# Patient Record
Sex: Male | Born: 1940 | Race: Black or African American | Hispanic: No | Marital: Married | Smoking: Former smoker
Health system: Southern US, Community
[De-identification: ages and names within clinical notes are randomized; demographics above are authoritative.]

## PROBLEM LIST (undated history)

## (undated) DIAGNOSIS — R972 Elevated prostate specific antigen [PSA]: Secondary | ICD-10-CM

## (undated) DIAGNOSIS — Z8673 Personal history of transient ischemic attack (TIA), and cerebral infarction without residual deficits: Secondary | ICD-10-CM

## (undated) DIAGNOSIS — M1990 Unspecified osteoarthritis, unspecified site: Secondary | ICD-10-CM

## (undated) DIAGNOSIS — Z8619 Personal history of other infectious and parasitic diseases: Secondary | ICD-10-CM

## (undated) DIAGNOSIS — D72819 Decreased white blood cell count, unspecified: Secondary | ICD-10-CM

## (undated) DIAGNOSIS — R413 Other amnesia: Secondary | ICD-10-CM

## (undated) DIAGNOSIS — Z86010 Personal history of colonic polyps: Secondary | ICD-10-CM

## (undated) DIAGNOSIS — R351 Nocturia: Secondary | ICD-10-CM

## (undated) DIAGNOSIS — L309 Dermatitis, unspecified: Secondary | ICD-10-CM

## (undated) DIAGNOSIS — IMO0002 Reserved for concepts with insufficient information to code with codable children: Secondary | ICD-10-CM

## (undated) DIAGNOSIS — H548 Legal blindness, as defined in USA: Secondary | ICD-10-CM

## (undated) DIAGNOSIS — F419 Anxiety disorder, unspecified: Secondary | ICD-10-CM

## (undated) DIAGNOSIS — Z978 Presence of other specified devices: Secondary | ICD-10-CM

## (undated) DIAGNOSIS — H409 Unspecified glaucoma: Secondary | ICD-10-CM

## (undated) DIAGNOSIS — R32 Unspecified urinary incontinence: Secondary | ICD-10-CM

## (undated) DIAGNOSIS — I10 Essential (primary) hypertension: Secondary | ICD-10-CM

## (undated) DIAGNOSIS — N200 Calculus of kidney: Secondary | ICD-10-CM

## (undated) DIAGNOSIS — K219 Gastro-esophageal reflux disease without esophagitis: Secondary | ICD-10-CM

## (undated) DIAGNOSIS — N433 Hydrocele, unspecified: Secondary | ICD-10-CM

## (undated) DIAGNOSIS — R319 Hematuria, unspecified: Secondary | ICD-10-CM

## (undated) DIAGNOSIS — G8929 Other chronic pain: Secondary | ICD-10-CM

## (undated) DIAGNOSIS — N201 Calculus of ureter: Secondary | ICD-10-CM

## (undated) DIAGNOSIS — R3915 Urgency of urination: Secondary | ICD-10-CM

## (undated) DIAGNOSIS — N183 Chronic kidney disease, stage 3 unspecified: Secondary | ICD-10-CM

## (undated) DIAGNOSIS — Z860101 Personal history of adenomatous and serrated colon polyps: Secondary | ICD-10-CM

## (undated) DIAGNOSIS — Z87442 Personal history of urinary calculi: Secondary | ICD-10-CM

## (undated) DIAGNOSIS — K5909 Other constipation: Secondary | ICD-10-CM

## (undated) DIAGNOSIS — M549 Dorsalgia, unspecified: Secondary | ICD-10-CM

## (undated) DIAGNOSIS — R29898 Other symptoms and signs involving the musculoskeletal system: Secondary | ICD-10-CM

## (undated) DIAGNOSIS — D696 Thrombocytopenia, unspecified: Secondary | ICD-10-CM

## (undated) DIAGNOSIS — Z923 Personal history of irradiation: Secondary | ICD-10-CM

## (undated) DIAGNOSIS — D631 Anemia in chronic kidney disease: Secondary | ICD-10-CM

## (undated) DIAGNOSIS — R339 Retention of urine, unspecified: Secondary | ICD-10-CM

## (undated) DIAGNOSIS — Z972 Presence of dental prosthetic device (complete) (partial): Secondary | ICD-10-CM

## (undated) DIAGNOSIS — R350 Frequency of micturition: Secondary | ICD-10-CM

## (undated) DIAGNOSIS — E785 Hyperlipidemia, unspecified: Secondary | ICD-10-CM

## (undated) DIAGNOSIS — R109 Unspecified abdominal pain: Secondary | ICD-10-CM

## (undated) DIAGNOSIS — C61 Malignant neoplasm of prostate: Secondary | ICD-10-CM

## (undated) HISTORY — DX: Elevated prostate specific antigen (PSA): R97.20

## (undated) HISTORY — PX: EXTRACORPOREAL SHOCK WAVE LITHOTRIPSY: SHX1557

## (undated) HISTORY — PX: OTHER SURGICAL HISTORY: SHX169

## (undated) HISTORY — DX: Personal history of other infectious and parasitic diseases: Z86.19

---

## 1997-06-29 ENCOUNTER — Emergency Department (HOSPITAL_COMMUNITY): Admission: EM | Admit: 1997-06-29 | Discharge: 1997-06-29 | Payer: Self-pay

## 1997-06-30 ENCOUNTER — Ambulatory Visit (HOSPITAL_COMMUNITY): Admission: EM | Admit: 1997-06-30 | Discharge: 1997-07-01 | Payer: Self-pay | Admitting: Emergency Medicine

## 1997-07-03 ENCOUNTER — Ambulatory Visit (HOSPITAL_COMMUNITY): Admission: RE | Admit: 1997-07-03 | Discharge: 1997-07-03 | Payer: Self-pay | Admitting: Urology

## 1998-04-27 ENCOUNTER — Emergency Department (HOSPITAL_COMMUNITY): Admission: EM | Admit: 1998-04-27 | Discharge: 1998-04-27 | Payer: Self-pay | Admitting: Emergency Medicine

## 2002-01-03 HISTORY — PX: TRANSURETHRAL RESECTION OF BLADDER: SUR1395

## 2002-03-24 ENCOUNTER — Emergency Department (HOSPITAL_COMMUNITY): Admission: EM | Admit: 2002-03-24 | Discharge: 2002-03-24 | Payer: Self-pay

## 2002-11-12 ENCOUNTER — Emergency Department (HOSPITAL_COMMUNITY): Admission: EM | Admit: 2002-11-12 | Discharge: 2002-11-12 | Payer: Self-pay | Admitting: Emergency Medicine

## 2002-12-10 ENCOUNTER — Ambulatory Visit: Admission: RE | Admit: 2002-12-10 | Discharge: 2002-12-10 | Payer: Self-pay | Admitting: Surgery

## 2002-12-23 ENCOUNTER — Encounter (INDEPENDENT_AMBULATORY_CARE_PROVIDER_SITE_OTHER): Payer: Self-pay | Admitting: Specialist

## 2002-12-23 ENCOUNTER — Inpatient Hospital Stay (HOSPITAL_COMMUNITY): Admission: RE | Admit: 2002-12-23 | Discharge: 2002-12-25 | Payer: Self-pay | Admitting: Urology

## 2002-12-23 HISTORY — PX: OTHER SURGICAL HISTORY: SHX169

## 2003-02-10 ENCOUNTER — Ambulatory Visit (HOSPITAL_BASED_OUTPATIENT_CLINIC_OR_DEPARTMENT_OTHER): Admission: RE | Admit: 2003-02-10 | Discharge: 2003-02-10 | Payer: Self-pay | Admitting: Urology

## 2003-02-10 ENCOUNTER — Ambulatory Visit (HOSPITAL_COMMUNITY): Admission: RE | Admit: 2003-02-10 | Discharge: 2003-02-10 | Payer: Self-pay | Admitting: Urology

## 2003-02-10 HISTORY — PX: OTHER SURGICAL HISTORY: SHX169

## 2003-02-13 ENCOUNTER — Ambulatory Visit (HOSPITAL_COMMUNITY): Admission: RE | Admit: 2003-02-13 | Discharge: 2003-02-13 | Payer: Self-pay | Admitting: Urology

## 2003-05-13 ENCOUNTER — Encounter: Admission: RE | Admit: 2003-05-13 | Discharge: 2003-05-13 | Payer: Self-pay | Admitting: Neurology

## 2003-05-25 ENCOUNTER — Emergency Department (HOSPITAL_COMMUNITY): Admission: EM | Admit: 2003-05-25 | Discharge: 2003-05-25 | Payer: Self-pay | Admitting: Internal Medicine

## 2003-05-27 ENCOUNTER — Emergency Department (HOSPITAL_COMMUNITY): Admission: EM | Admit: 2003-05-27 | Discharge: 2003-05-27 | Payer: Self-pay | Admitting: Emergency Medicine

## 2003-05-29 ENCOUNTER — Ambulatory Visit (HOSPITAL_BASED_OUTPATIENT_CLINIC_OR_DEPARTMENT_OTHER): Admission: RE | Admit: 2003-05-29 | Discharge: 2003-05-29 | Payer: Self-pay | Admitting: Urology

## 2003-05-29 HISTORY — PX: OTHER SURGICAL HISTORY: SHX169

## 2004-10-18 ENCOUNTER — Emergency Department (HOSPITAL_COMMUNITY): Admission: EM | Admit: 2004-10-18 | Discharge: 2004-10-18 | Payer: Self-pay | Admitting: Emergency Medicine

## 2004-10-24 ENCOUNTER — Emergency Department (HOSPITAL_COMMUNITY): Admission: EM | Admit: 2004-10-24 | Discharge: 2004-10-24 | Payer: Self-pay | Admitting: Emergency Medicine

## 2005-05-08 ENCOUNTER — Emergency Department (HOSPITAL_COMMUNITY): Admission: EM | Admit: 2005-05-08 | Discharge: 2005-05-08 | Payer: Self-pay | Admitting: Emergency Medicine

## 2005-09-22 ENCOUNTER — Encounter: Admission: RE | Admit: 2005-09-22 | Discharge: 2005-09-22 | Payer: Self-pay | Admitting: Family Medicine

## 2005-11-06 ENCOUNTER — Inpatient Hospital Stay (HOSPITAL_COMMUNITY): Admission: EM | Admit: 2005-11-06 | Discharge: 2005-11-11 | Payer: Self-pay | Admitting: *Deleted

## 2005-11-06 HISTORY — PX: OTHER SURGICAL HISTORY: SHX169

## 2005-11-07 ENCOUNTER — Encounter: Payer: Self-pay | Admitting: Urology

## 2005-11-14 HISTORY — PX: OTHER SURGICAL HISTORY: SHX169

## 2007-08-08 ENCOUNTER — Emergency Department (HOSPITAL_COMMUNITY): Admission: EM | Admit: 2007-08-08 | Discharge: 2007-08-08 | Payer: Self-pay | Admitting: Emergency Medicine

## 2007-11-05 ENCOUNTER — Emergency Department (HOSPITAL_COMMUNITY): Admission: EM | Admit: 2007-11-05 | Discharge: 2007-11-05 | Payer: Self-pay | Admitting: Emergency Medicine

## 2007-11-08 ENCOUNTER — Ambulatory Visit (HOSPITAL_COMMUNITY): Admission: RE | Admit: 2007-11-08 | Discharge: 2007-11-08 | Payer: Self-pay | Admitting: Urology

## 2007-12-03 ENCOUNTER — Encounter: Payer: Self-pay | Admitting: Emergency Medicine

## 2007-12-04 ENCOUNTER — Inpatient Hospital Stay (HOSPITAL_COMMUNITY): Admission: EM | Admit: 2007-12-04 | Discharge: 2007-12-04 | Payer: Self-pay | Admitting: Urology

## 2007-12-10 ENCOUNTER — Ambulatory Visit (HOSPITAL_COMMUNITY): Admission: RE | Admit: 2007-12-10 | Discharge: 2007-12-12 | Payer: Self-pay | Admitting: Urology

## 2008-12-03 DIAGNOSIS — Z8673 Personal history of transient ischemic attack (TIA), and cerebral infarction without residual deficits: Secondary | ICD-10-CM

## 2008-12-03 HISTORY — DX: Personal history of transient ischemic attack (TIA), and cerebral infarction without residual deficits: Z86.73

## 2008-12-18 ENCOUNTER — Encounter (INDEPENDENT_AMBULATORY_CARE_PROVIDER_SITE_OTHER): Payer: Self-pay | Admitting: Internal Medicine

## 2008-12-18 ENCOUNTER — Ambulatory Visit: Payer: Self-pay | Admitting: Vascular Surgery

## 2008-12-18 ENCOUNTER — Observation Stay (HOSPITAL_COMMUNITY): Admission: EM | Admit: 2008-12-18 | Discharge: 2008-12-19 | Payer: Self-pay | Admitting: Emergency Medicine

## 2008-12-18 ENCOUNTER — Ambulatory Visit: Payer: Self-pay | Admitting: Cardiology

## 2008-12-19 ENCOUNTER — Encounter (INDEPENDENT_AMBULATORY_CARE_PROVIDER_SITE_OTHER): Payer: Self-pay | Admitting: Internal Medicine

## 2008-12-19 HISTORY — PX: TRANSTHORACIC ECHOCARDIOGRAM: SHX275

## 2009-04-04 ENCOUNTER — Emergency Department (HOSPITAL_COMMUNITY): Admission: EM | Admit: 2009-04-04 | Discharge: 2009-04-04 | Payer: Self-pay | Admitting: Emergency Medicine

## 2009-05-30 ENCOUNTER — Emergency Department (HOSPITAL_COMMUNITY): Admission: EM | Admit: 2009-05-30 | Discharge: 2009-05-30 | Payer: Self-pay | Admitting: Emergency Medicine

## 2009-10-31 ENCOUNTER — Emergency Department (HOSPITAL_COMMUNITY)
Admission: EM | Admit: 2009-10-31 | Discharge: 2009-10-31 | Payer: Self-pay | Source: Home / Self Care | Admitting: Family Medicine

## 2009-12-02 ENCOUNTER — Encounter: Admission: RE | Admit: 2009-12-02 | Discharge: 2009-12-02 | Payer: Self-pay | Admitting: Family Medicine

## 2010-01-08 LAB — CBC
HCT: 46.7 % (ref 39.0–52.0)
Hemoglobin: 16 g/dL (ref 13.0–17.0)
MCH: 33.6 pg (ref 26.0–34.0)
MCHC: 34.3 g/dL (ref 30.0–36.0)
MCV: 98.1 fL (ref 78.0–100.0)
Platelets: 152 10*3/uL (ref 150–400)
RBC: 4.76 MIL/uL (ref 4.22–5.81)
RDW: 11.8 % (ref 11.5–15.5)
WBC: 5.7 10*3/uL (ref 4.0–10.5)

## 2010-01-13 ENCOUNTER — Inpatient Hospital Stay (HOSPITAL_COMMUNITY)
Admission: RE | Admit: 2010-01-13 | Discharge: 2010-01-16 | Payer: Self-pay | Source: Home / Self Care | Attending: Urology | Admitting: Urology

## 2010-01-18 LAB — POCT I-STAT 4, (NA,K, GLUC, HGB,HCT)
Glucose, Bld: 110 mg/dL — ABNORMAL HIGH (ref 70–99)
HCT: 42 % (ref 39.0–52.0)
Hemoglobin: 14.3 g/dL (ref 13.0–17.0)
Potassium: 5.1 mEq/L (ref 3.5–5.1)
Sodium: 140 mEq/L (ref 135–145)

## 2010-01-18 LAB — CBC
HCT: 33.2 % — ABNORMAL LOW (ref 39.0–52.0)
HCT: 32.3 % — ABNORMAL LOW (ref 39.0–52.0)
Hemoglobin: 11.2 g/dL — ABNORMAL LOW (ref 13.0–17.0)
Hemoglobin: 10.9 g/dL — ABNORMAL LOW (ref 13.0–17.0)
MCH: 33.2 pg (ref 26.0–34.0)
MCH: 33 pg (ref 26.0–34.0)
MCHC: 33.7 g/dL (ref 30.0–36.0)
MCHC: 33.7 g/dL (ref 30.0–36.0)
MCV: 98.5 fL (ref 78.0–100.0)
MCV: 97.9 fL (ref 78.0–100.0)
Platelets: 128 10*3/uL — ABNORMAL LOW (ref 150–400)
Platelets: 137 10*3/uL — ABNORMAL LOW (ref 150–400)
RBC: 3.37 MIL/uL — ABNORMAL LOW (ref 4.22–5.81)
RBC: 3.3 MIL/uL — ABNORMAL LOW (ref 4.22–5.81)
RDW: 11.8 % (ref 11.5–15.5)
RDW: 11.9 % (ref 11.5–15.5)
WBC: 12.7 10*3/uL — ABNORMAL HIGH (ref 4.0–10.5)
WBC: 18.2 10*3/uL — ABNORMAL HIGH (ref 4.0–10.5)

## 2010-01-18 LAB — TYPE AND SCREEN
ABO/RH(D): O POS
Antibody Screen: NEGATIVE

## 2010-01-18 LAB — ABO/RH: ABO/RH(D): O POS

## 2010-01-24 ENCOUNTER — Encounter: Payer: Self-pay | Admitting: Neurology

## 2010-01-24 ENCOUNTER — Encounter: Payer: Self-pay | Admitting: Family Medicine

## 2010-01-24 ENCOUNTER — Encounter: Payer: Self-pay | Admitting: Urology

## 2010-01-28 LAB — SURGICAL PCR SCREEN: MRSA, PCR: NEGATIVE

## 2010-01-29 ENCOUNTER — Ambulatory Visit (HOSPITAL_COMMUNITY)
Admission: RE | Admit: 2010-01-29 | Discharge: 2010-01-29 | Payer: Self-pay | Source: Home / Self Care | Attending: Urology | Admitting: Urology

## 2010-01-29 LAB — CBC
HCT: 31.5 % — ABNORMAL LOW (ref 39.0–52.0)
Hemoglobin: 10.9 g/dL — ABNORMAL LOW (ref 13.0–17.0)
MCH: 33.4 pg (ref 26.0–34.0)
MCHC: 34.6 g/dL (ref 30.0–36.0)
MCV: 96.6 fL (ref 78.0–100.0)
Platelets: 241 10*3/uL (ref 150–400)
RBC: 3.26 MIL/uL — ABNORMAL LOW (ref 4.22–5.81)
RDW: 12.2 % (ref 11.5–15.5)
WBC: 7.7 10*3/uL (ref 4.0–10.5)

## 2010-02-03 NOTE — Op Note (Signed)
[name], [name]               ACCOUNT NO.:  [id number]  MEDICAL RECORD NO.:  [id number]          PATIENT TYPE:  INP  LOCATION:  NA                           FACILITY:  [facility]  PHYSICIAN:  Bertram Millard. 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  POSTOPERATIVE DIAGNOSIS:  A 10-cm left staghorn stone.  PROCEDURE:  Percutaneous nephrolithotomy.  SURGEON:  Bertram Millard. Halia Franey, M.D.  ANESTHESIA:  General endotracheal.  COMPLICATIONS:  None.  SPECIMEN:  Stones to family.  DRAINS:  A 16-French Foley catheter transurethrally, 20-French Council- tip catheter,  Superfit nephrostomy tube.  COMPLICATIONS:  None.  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  He was seen recently for left flank pain.  He was scanned, and this showed a 10-cm left renal stone in the staghorn configuration.  The patient has not been on urinary optimization, and the stone has obviously progressed to the point where it is painful and needs treatment.  He has been seen in the office, and it was recommended he undergo removal of the stone by way percutaneous nephrolithotomy. Risks and complications of the procedure as well as alternatives have been discussed at length. He understands that a second look procedure may be necessary.  He understands these and desires to proceed.  DESCRIPTION OF PROCEDURE:  The patient was identified and marked in the holding area.  He received preoperative IV Cipro.  He was taken to the operating room where a general endotracheal anesthetic was established. A 16-French Foley catheter was placed in his bladder.  The patient was then transferred to the OR table and placed in the prone position.  All extremities were padded appropriately.  His left flank was prepped and draped.  A time-out was  then called.  I then gained access to the left renal pelvis by using 2 guide wires, a peel-away catheter, and eventually dilating  the nephrostomy tract with a Trackmaster balloon and a 28-French percutaneous sheath.  I then identified the stone with the nephroscope.  The lithotrite was used through the nephroscope to fragment the stone into multiple small fragments, as well as to aspirate the stone with use of the ultrasound. Multiple small fragments were removed with the graspers.  Over a period of both 2 hours, the stone was fragmented and extracted.  Inspection of the upper and lower pole as well as the renal pelvic area revealed multiple sand-like fragments, but the margins were removed and I did not see any significantly large fragments.  There was a significant amount of bleeding during the procedure due to the friability of the kidney.  I did my best to visualize all aspects of the kidney before the procedure was terminated, and a 20-French Council tip catheter was placed over the guidewire through the flank.  5 mL of contrast was placed in the balloon, a nephrogram was antegrade, and  nephrostogram was then performed.  As there was avery large stone burden, and significant bleeding during the procedure,  I decided that a second look procedure would be necessary for removing remaining stone burden.  This showed egress of the antegrade movement of the contrast into the ureter without obstruction.  There were multiple filling defects in the kidney consistent with blood clots, more than likely.  No extravasation was seen.  At this point, the catheter was sutured to the skin, and a compressive dressing was placed.  The patient's hemoglobin was checked intraoperatively and was 14, down from 16 preoperatively.  We then transported the patient to the PACU in stable condition after he was extubated.     Bertram Millard. Nejla Reasor, M.D.     SMD/MEDQ  D:  01/13/2010  T:  01/13/2010   Job:  [id number]  cc:   Dr. Vedia Coffer  Electronically Signed by Marcine Matar M.D. on 02/03/2010 06:01:39 PM

## 2010-02-03 NOTE — Op Note (Signed)
NAMEJARRON, [name]               ACCOUNT NO.:  [id number]  MEDICAL RECORD NO.:  [id number]          PATIENT TYPE:  AMB  LOCATION:  DAY                          FACILITY:  [facility]  PHYSICIAN:  Bertram Millard. Jeremyah Jelley, M.D.DATE OF BIRTH:  Jun 12, 1940  DATE OF PROCEDURE:  01/29/2010 DATE OF DISCHARGE:                              OPERATIVE REPORT   PREOPERATIVE DIAGNOSIS:  Left renal calculi.  POSTOPERATIVE DIAGNOSIS:  Left renal calculi.  PROCEDURE:  Left percutaneous nephrolithotomy, second look.  SURGEON:  Bertram Millard. Marian Meneely, MD  ANESTHESIA:  General endotracheal.  COMPLICATIONS:  None.  SPECIMEN:  Stone, to family.  BRIEF HISTORY:  This 70 year old male presents for a second-look percutaneous nephrolithotomy.  Two weeks ago, he had operative/percutaneous treatment of a 10 cm left renal pelvic stone. The patient had some fragments left.  Due to lengthy procedure and bleeding, the procedure was terminated after the large burden of stone was removed.  Followup CT revealed no perirenal hematoma and several tiny fragments as well as a 7 mm fragment left in the left kidney.  He has gone home over the past 2 weeks with a percutaneous nephrostomy tube.  He presents at this time for second look, cleanup of the residual calculi.  He is aware of risks and complications of the procedure.  He desires to proceed.  DESCRIPTION OF PROCEDURE:  The patient was identified in the holding area, the surgical site was marked, and he received preoperative IV antibiotics.  He was taken to the operating room where general endotracheal anesthetic was administered with the patient in the recumbent position.  His bladder was catheterized.  He was then placed in the prone position on the operating room table with all extremities padded appropriately.  The left flank was prepped and draped, with the 2 catheters prepped as well.  Drapes were placed.  I guided a guidewire under fluoroscopic guidance to  the ureteral access catheter that was placed percutaneously 2 weeks ago.  Once the wire was found to be in the bladder, both the large nephrostomy tube and the ureteral access catheter removed.  I then placed a 16-French cystoscope into the left kidney through the nephrostomy tract, which had matured.  There was 1 stone that I could eventually encounter in an upper pole calix that was located quite anteriorly.  It was approximately 7 mm to 8 mm in size. It took quite some time, but I navigated into the calix to remove this stone with a nitinol basket.  Several small Randall's plaques were present on papillae.  I attempted to knock these off the papillae, but this could not be done.  No other calculi were seen, with all upper, mid, and lower pole calyces inspected sequentially.  Following this, the scope was removed.  I then placed a 28 cm x 6 French Contour double-J stent using fluoroscopic guidance.  A good curl was seen in the bladder.  I then removed the pusher, and then closed the percutaneous site with a mattress suture of 0 silk.  A dry sterile dressing was placed.  No percutaneous tube was left.  The patient tolerated the procedure  well.  He was awakened and taken to PACU in stable condition.     Bertram Millard. Retta Diones, M.D.     SMD/MEDQ  D:  01/29/2010  T:  01/29/2010  Job:  [id number]  Electronically Signed by Marcine Matar M.D. on 02/03/2010 05:58:51 PM

## 2010-03-04 NOTE — Discharge Summary (Signed)
  [name], [name]               ACCOUNT NO.:  [id number]  MEDICAL RECORD NO.:  [id number]          PATIENT TYPE:  INP  LOCATION:  1433                         FACILITY:  [facility]  PHYSICIAN:  Bertram Millard. Rune Mendez, M.D.DATE OF BIRTH:  03/08/1940  DATE OF ADMISSION:  01/13/2010 DATE OF DISCHARGE:  01/16/2010                              DISCHARGE SUMMARY   PRINCIPAL PROCEDURE:  Left percutaneous nephrolithotomy.  COMPLICATIONS:  None.  OTHER DIAGNOSES:  History of recurrent renal calculi.  BRIEF HISTORY:  This elderly male was admitted for definitive management of an enlarging, 10 cm left staghorn stone.  The patient is noncompliant with medical management of a stone disease, this is large left renal stone.  He is admitted for management.  HOSPITAL COURSE:  The patient was admitted directly to the operating room.  He underwent left percutaneous nephrolithotomy after his nephrostomy tube was placed by Interventional Radiology.  He did have significant amount of bleeding during the procedure.  Preoperatively, his hematocrit was 46%.  Postoperatively, it went down to 32%.  After the procedure, though, he had no significant bleeding.  His nephrostomy tube, the large one, was replaced with a small one that was clamped off. The patient voided well, and tolerated regular diet.  He was discharged on the 14th.  At that time, medications included, 1. Combigan drops. 2. Urocit-K 10 mEq 2 p.o. t.i.d. 3. Xanax 0.5 mg daily at bedtime. 4. Hydrocodone/APAP as needed. 5. Pravachol 20 mg p.o. twice daily. 6. Neurontin 300 mg p.o. 3 times a day. 7. Norvasc 10 mg p.o. q.a.m. 8. Atenolol 50 mg p.o. q.a.m.  He will follow up with me in approximately 1 week.  He did have a small fragment or two left within his renal pelvis after the procedure, and most likely will be undergoing a second-look procedure in the near future.  He was discharged in improved condition.     Bertram Millard. Retta Diones,  M.D.     SMD/MEDQ  D:  03/03/2010  T:  03/04/2010  Job:  [id number]  Electronically Signed by Marcine Matar M.D. on 03/04/2010 07:56:12 AM

## 2010-03-24 LAB — POCT URINALYSIS DIP (DEVICE)
Bilirubin Urine: NEGATIVE
Nitrite: NEGATIVE
Protein, ur: NEGATIVE mg/dL
pH: 5 (ref 5.0–8.0)

## 2010-03-24 LAB — POCT I-STAT, CHEM 8
BUN: 25 mg/dL — ABNORMAL HIGH (ref 6–23)
Calcium, Ion: 1.25 mmol/L (ref 1.12–1.32)
Chloride: 110 mEq/L (ref 96–112)
Glucose, Bld: 92 mg/dL (ref 70–99)
HCT: 49 % (ref 39.0–52.0)

## 2010-04-05 LAB — BASIC METABOLIC PANEL
BUN: 22 mg/dL (ref 6–23)
BUN: 22 mg/dL (ref 6–23)
CO2: 24 mEq/L (ref 19–32)
Chloride: 108 mEq/L (ref 96–112)
Chloride: 108 mEq/L (ref 96–112)
Creatinine, Ser: 1.55 mg/dL — ABNORMAL HIGH (ref 0.4–1.5)
Glucose, Bld: 115 mg/dL — ABNORMAL HIGH (ref 70–99)
Potassium: 4 mEq/L (ref 3.5–5.1)

## 2010-04-05 LAB — PROTIME-INR
INR: 1.03 (ref 0.00–1.49)
Prothrombin Time: 13.4 seconds (ref 11.6–15.2)

## 2010-04-05 LAB — CBC
HCT: 43.1 % (ref 39.0–52.0)
MCHC: 34.3 g/dL (ref 30.0–36.0)
MCV: 99 fL (ref 78.0–100.0)
MCV: 99.4 fL (ref 78.0–100.0)
Platelets: 144 10*3/uL — ABNORMAL LOW (ref 150–400)
Platelets: 146 10*3/uL — ABNORMAL LOW (ref 150–400)
RDW: 12.5 % (ref 11.5–15.5)
WBC: 6.3 10*3/uL (ref 4.0–10.5)

## 2010-04-05 LAB — POCT CARDIAC MARKERS: Troponin i, poc: <0.05 ng/mL (ref 0.00–0.09)

## 2010-04-05 LAB — DIFFERENTIAL
Basophils Relative: 0 % (ref 0–1)
Eosinophils Absolute: 0.3 10*3/uL (ref 0.0–0.7)
Neutrophils Relative %: 61 % (ref 43–77)

## 2010-05-18 NOTE — Op Note (Signed)
[name], [name]               ACCOUNT NO.:  [id number]   MEDICAL RECORD NO.:  [id number]          PATIENT TYPE:  OIB   LOCATION:  0098                         FACILITY:  [facility]   PHYSICIAN:  Bertram Millard. Dahlstedt, M.D.DATE OF BIRTH:  [birth year]   DATE OF PROCEDURE:  12/10/2007  DATE OF DISCHARGE:                               OPERATIVE REPORT   PREOPERATIVE DIAGNOSIS:  Right renal calculi.   POSTOPERATIVE DIAGNOSIS:  Right renal calculi.   PRINCIPAL PROCEDURE:  Percutaneous right nephrolithotomy.   SURGEON:  Bertram Millard. Dahlstedt, M.D.   ANESTHESIA:  General endotracheal.   RADIOLOGIST:  D. Oley Balm, M.D.   COMPLICATIONS:  None.   SPECIMENS:  Stones, to family.   COMPLICATIONS:  None.   BRIEF HISTORY:  This 70 year old male has a history of recurrent  urolithiasis.  He has uric acid calculi.  He has been noncompliant with  medical management, and has regrown fairly large calculi, worse on the  left than the right.  However, his right stones are more symptomatic,  with a 1-cm right UPJ stone, necessitating right percutaneous tube  placement within the past week.  He presents at this time for definitive  treatment of right renal calculi, including two lower pole stones which  are fairly large and a right UPJ stone.  Risks and complications of the  procedure have been discussed with the patient.  He understands these  and desires to proceed.   DESCRIPTION OF PROCEDURE:  The patient was identified in the holding  area, received preoperative IV Cipro, and the surgical side was marked.  He was taken to the operating room, where general anesthetic was  administered with the endotracheal apparatus.  He was placed in the  prone position, all extremities padded appropriately.  His bladder was  catheterized.  His right flank was prepped and draped.  Dr. Deanne Coffer  performed percutaneous access with a 28-French sheath into the right  renal pelvis.  Following this, a  nephroscope was placed and a large UPJ  stone was encountered.  It was broken up and extracted with the  ultrasonic device and with grasping forceps.  Two other fairly large  stones were encountered in the right lower pole.  These were grasped  with the grasping forceps and removed in their entirety.  Careful  inspection of all calyces with the flexible scope was performed.  This  revealed no evident calculi.  The scope was passed into the upper ureter  and no stones were seen there.  Following this, an 18-French Councill-  tip catheter was placed over a guidewire, and localized in the renal  pelvis with fluoroscopy and contrast.  The balloon of the catheter was filled with 3 cc of liquid, and it was  sutured to the skin with a 2-0 silk.  This was hooked to dependent  drainage.  The remaining guidewire was removed.  A dry sterile dressing  was placed.   The patient tolerated the procedure well.  He was awakened and taken to  PACU in stable condition.      Bertram Millard. Dahlstedt, M.D.  Electronically Signed  SMD/MEDQ  D:  12/10/2007  T:  12/10/2007  Job:  [id number]

## 2010-05-21 NOTE — Op Note (Signed)
[name], [name]                         ACCOUNT NO.:  [id number]   MEDICAL RECORD NO.:  [id number]                   PATIENT TYPE:  INP   LOCATION:  0367                                 FACILITY:  [facility]   PHYSICIAN:  Bertram Millard. Dahlstedt, M.D.          DATE OF BIRTH:  11/06/1940   DATE OF PROCEDURE:  12/23/2002  DATE OF DISCHARGE:                                 OPERATIVE REPORT   PREOPERATIVE DIAGNOSES:  1. Urethral stricture.  2. Benign prostatic hypertrophy.   POSTOPERATIVE DIAGNOSES:  1. Urethral stricture.  2. Benign prostatic hypertrophy.   PRINCIPAL PROCEDURE:  1. Internal urethrotomy.  2. Transurethral resection of prostate.   SURGEON:  Bertram Millard. Dahlstedt, M.D.   ANESTHESIA:  General.   COMPLICATIONS:  None.   BRIEF HISTORY:  This middle aged male presented to my office with  microscopic hematuria.  Evaluation revealed a large right renal staghorn  calculus, a fairly tight urethral stricture located in the bulbous region  and BPH.  The patient does have significant voiding symptoms. Additionally,  he has a left inguinal hernia which will be repaired by Dr. Johna Sheriff, to be  dictated in a separate note.   He will need to have procedures scheduled for treatment of his large right  renal calculus.  In the meantime, I have recommended internal urethrotomy  and TURP.  He is aware of the risks and complications of this procedure and  has decided to proceed.   DESCRIPTION OF PROCEDURE:  The patient was administered preoperative IV  antibiotics and taken to the operating room where general anesthetic was  administered. He was placed in the dorsal lithotomy position.  Genitalia and  perineum were prepped and draped.  His urethral meatus was calibrated with a  30 [place] with [employer] sounds.  The optical urethrotome was placed with  the knife. The stricture was encountered at the bulbous urethra and an  incision was made in the 12 o'clock position.  This easily  allowed the 27  French resectoscope sheath to be placed in the bladder using the obturator.  The prostate was moderately obstructive.  The bladder appeared normal.  TURP  was then performed.  He had a fairly high bladder neck with median lobe  prostate. The median lobe was resected from the bladder neck area to the  area of the verumontanum, taking caution to preserve the verumontanum.  The  lateral lobes were then resected down to the surgical capsule.  At this  point, the small bleeders were electrocoagulated.  There was excellent  hemostasis.  The chips were irrigated from the bladder. A second look at the  resected fossa revealed no evidence of bleeding. At this point, a 8 French  Foley catheter with three way irrigation was placed.  This was hooked to  overhead irrigation and dependent drainage.   The patient tolerated the procedure well.  He was then placed in supine  position and  Dr. Johna Sheriff commenced with the hernia repair.                                               Bertram Millard. Dahlstedt, M.D.    SMD/MEDQ  D:  12/25/2002  T:  12/25/2002  Job:  [id number]

## 2010-05-21 NOTE — Op Note (Signed)
[name], [name]               ACCOUNT NO.:  [id number]   MEDICAL RECORD NO.:  [id number]          PATIENT TYPE:  INP   LOCATION:  1613                         FACILITY:  [facility]   PHYSICIAN:  Bertram Millard. Dahlstedt, M.D.DATE OF BIRTH:  1940-01-10   DATE OF PROCEDURE:  11/14/2005  DATE OF DISCHARGE:  11/11/2005                                 OPERATIVE REPORT   PREOPERATIVE DIAGNOSIS:  Left renal calculus.   POSTOPERATIVE DIAGNOSIS:  Left renal calculus.   PRINCIPAL PROCEDURE:  Cystoscopy, bilateral retrograde ureteral pyelograms,  flexible left ureteral pyeloscopy, engagement of left renal calculus, laser  of left renal calculus, extraction of left renal calculus, double-J stent  placement on the left.   SURGEON:  Bertram Millard. Dahlstedt, M.D.   ANESTHESIA:  General LMA.   COMPLICATIONS:  None.   ESTIMATED BLOOD LOSS:  None.   SPECIMEN:  Renal stone.   BRIEF HISTORY:  Mr. Downs is a 70 year old gentleman who underwent right  percutaneous nephrolithotomy 3 days ago.  He has had a huge right renal  stone which we have known about for a couple of years.  This is uric acid in  nature, as it is radiolucent.  He presented today before his percutaneous  procedure because of left flank pain.  He had a obstructing left UPJ stone  at that time with significant pain.  He is admitted today a day early and a  stent was placed by Dr. Wanda Plump.  He has had a successful percutaneous  nephrolithotomy, and is now stone free on the right.  We are addressing the  left UPJ stone now.  Since it is radiolucent, he is not a candidate for  extracorporeal shock wave lithotripsy.   He is aware of the need to proceed with a ureteroscopy on the left.  Risks  and complications of the procedure have been discussed with the patient at  length.  He understands these and desires to proceed.   DESCRIPTION OF PROCEDURE:  The patient had been on p.o. antibiotics, was  given IV Cipro in the holding area.   The surgical side was marked.  He was  taken to the operating room where general anesthetic was administered using  LMA.  Placed in the dorsal lithotomy position.  Genitalia and perineum were  prepped and draped.  A 22-French panendoscope was advanced into his bladder.  The bladder was essentially normal.  The left stent was extracted intact.  At this point, bilateral retrograde ureteral pyelograms were performed.   On the right, I checked to see that he did not have any significant  obstruction, he still had some serous drainage from his percutaneous site.  A retrograde was performed using the end-hole catheter.  This showed a  normal ureter without evident filling defects.  The pyelocaliceal system was  normal except for some irregularity of the upper calix.  I saw no evident  filling defects in that pyelocaliceal system.   At this point a left retrograde ureteral pyelogram was performed.  The left  ureter was slightly dilated.  There are no filling defects.  The UPJ stone  had moved into the lower pole calix.  The pyelocaliceal system on the left  was normal except for a filling defect in the left lower pole.   At this point a guidewire was placed up into the left ureter.  A 55 cm  ureteral access sheath was introduced over top of the guidewire.  At this  point the inner sheath was removed and the flexible ureteroscope was placed.  The pelvis was normal.  The upper and mid aspects of the pyelocaliceal  system were examined.  They were normal.  The stone was encountered in one  of the lower pole calyces.  I was able to engage the stone with the nitinol  basket.  I then pulled the stone into the upper ureter.  At this point I was  able to leave the stone within the basket.  I then removed the ureteroscope,  leaving the basket engaging the stone.  I then replaced the ureteroscope  beside the basket wire.  I then used the 200 micron laser fiber to fragment  the stone within the basket.   Approximately 10 to 20 stone fragments were  obtained.  I pulled the largest one out.  The smaller ones were then left to  fall out.  A larger one or two of these were then extracted by pulling the  access sheath out in front of the stone.  The ureteroscope was advanced into  the kidney again.  No further stones were seen.  At this point the  ureteroscope was removed.  No stones were seen along the ureters.   As there had been a fair amount of ureteral edema from lasering the stone, I  have felt it best to leave the double-J stent in.  A 24 cm x 4.8 [place]  contour stent was then placed.  Good curls were seen proximally and distally  using fluoroscopic and cystoscopic guidance.  I then emptied the bladder and  the procedure was terminated.   The patient tolerated procedure well.  He was awakened and taken to PACU in  stable condition.      Bertram Millard. Dahlstedt, M.D.  Electronically Signed     SMD/MEDQ  D:  11/14/2005  T:  11/14/2005  Job:  [id number]

## 2010-05-21 NOTE — Op Note (Signed)
NAMETYKEE, HEIDEMAN                         ACCOUNT NO.:  [id number]   MEDICAL RECORD NO.:  [id number]                   PATIENT TYPE:  AMB   LOCATION:  [facility]                                 FACILITY:  [facility]   PHYSICIAN:  Bertram Millard. Dahlstedt, M.D.          DATE OF BIRTH:  29-Aug-1940   DATE OF PROCEDURE:  02/10/2003  DATE OF DISCHARGE:                                 OPERATIVE REPORT   PREOPERATIVE DIAGNOSIS:  Large right staghorn calculus.   POSTOPERATIVE DIAGNOSIS:  Large right staghorn calculus.   OPERATION/PROCEDURE:  1. Cystoscopy.  2. Right right retrograde ureteropyelogram.  3. Double-J stent placement.   SURGEON:  Bertram Millard. Dahlstedt, M.D.   ANESTHESIA:  General with LMA.   COMPLICATIONS:  None.   BRIEF HISTORY:  This 70 year old gentleman first presented last year with  microscopic hematuria.  The patient was found to have a large right renal  pelvic calcification consistent with a staghorn calculus.  Additionally he  had significant obstructive uropathy.  At that time of initial presentation,  he was also noted to have an inguinal hernia.  He underwent TURP and  inguinal hernia repair in December.  He presents at this time for stent  placement in his right kidney.  This is in preparation for lithotripsy later  this week to treat this large renal pelvic stone.   I did counsel the patient in treatment options for his right staghorn  calculus.  He is aware of the options of percutaneous nephrolithotomy and  extracorporeal shock wave lithotripsy.  The patient favors the latter as he  does not want any significant anesthetic procedure at this time.  He is  aware of the fact that he most likely will have to have more than one  treatment on this stone.   DESCRIPTION OF PROCEDURE:  The patient was administered preoperative IV  antibiotics and taken to the operating room where general anesthetic was  administered using a laryngeal mask airway.  He was placed in the  dorsal  lithotomy position.  Genitalis and perineum were prepped and draped.  A B&O  suppository was placed.  A 22-French panendoscope was advanced through his  urethra.  He had no obstructive tissue in the prostatic urethra, consistent  with recent TURP.  Some friability of tissue remained.  The right ureteral  orifice was identified just at the edge of the inflammatory tissue.  It was  cannulated with a 6-French end-hole catheter and a retrograde showed a large  filling defect in the renal pelvis without hydronephrosis.  A guide wire was  advanced through the catheter and the catheter was removed.  A double-J  stent (24 cm x 6-French) was then placed fluoroscopically and  cystoscopically.  Good curls were seen proximally and distally.  At this  point the bladder was drained and the scope removed.   The patient tolerated the procedure well.  He was awakened and taken to the  PACU in stable condition.   He will follow up in three days for lithotripsy.  He was discharged home on  [place] 1 p.o. q.6h. p.r.n. urinary urgency/burning #30 and Levaquin 250 mg  one p.o. daily times three days.                                               Bertram Millard. Dahlstedt, M.D.    SMD/MEDQ  D:  02/10/2003  T:  02/10/2003  Job:  [id number]   cc:   Dr. Kerry Hough  [place]

## 2010-05-21 NOTE — Discharge Summary (Signed)
[name], [name]               ACCOUNT NO.:  [id number]   MEDICAL RECORD NO.:  [id number]          PATIENT TYPE:  INP   LOCATION:  1613                         FACILITY:  [facility]   PHYSICIAN:  Bertram Millard. Dahlstedt, M.D.DATE OF BIRTH:  09-Oct-1940   DATE OF ADMISSION:  11/06/2005  DATE OF DISCHARGE:  11/11/2005                               DISCHARGE SUMMARY   ADMISSION DIAGNOSES:  1. Right staghorn stone.  2. Left ureteral stone.  3. Acute renal failure.   DISCHARGE DIAGNOSES:  1. Right staghorn stone.  2. Left ureteral stone.  3. Acute renal failure.   PROCEDURES PERFORMED:  1. Cystoscopy with bilateral retrograde and bilateral stent placement      on November 06, 2005.  2. Right percutaneous nephrostolithotomy on November 07, 2005.  3. Left ureteroscopic stone manipulation on November 10, 2005.   HISTORY OF PRESENT ILLNESS:  [name] is a 70 year old gentleman  previous patient of Dr. Retta Diones.  The patient was seen in the  emergency room with flank pain.  Imaging demonstrated right staghorn  stone and a left 6 mm proximal ureteral stone.  The patient also had  increase in his creatinine from baseline of a 1.7 to approximately 3.1.   HOSPITAL COURSE:  The patient was seen and evaluated in emergency room  and admitted for bilateral stent placement.  He had this performed  without complication.  He subsequently was scheduled for percutaneous  nephrostolithotomy of his right staghorn stone.  This was performed on  hospital day #2 with the assistance of interventional radiology.  The  patient tolerated this procedure well without difficulty.  Follow-up CT  scan on postop day #1 revealed no residual was stone fragments in the  right kidney.  The nephrostomy tube was clamped.  The patient tolerated  this well and was removed the following day.  The decision was then made  to address the left side of stone.  On hospital day #4, the patient was  taken the operating room for left  ureteroscopic stone manipulation.  The  patient tolerated this well without complication and was then  transferred back to the floor.  He was then otherwise doing well and was  deemed stable for discharge the following day.  At the time of  discharge, he was afebrile with stable vital signs.  In general, he was  in no acute distress.  Heart was regular.  Lungs were clear.  Abdomen  was soft and his pain was well controlled.  His creatinine had decreased  to 1.9 after intervention.   DISPOSITION/DISCHARGE INSTRUCTIONS:  The patient was discharged to home  in stable and improved condition.  He was instructed to resume his  previous diet and activity level.  He is to call with any questions or  concerns, specifically fevers greater than 101.5 or uncontrolled pain,  nausea, vomiting or increased redness or drainage from his nephrostomy  site.   He was instructed to resume his previous medications as documented on  his home medicine reconciliation sheet.  He was given a prescription for  Vicodin and Cipro.   He will  follow up with Dr. Retta Diones and Dr. Lenoria Chime office will  call him to set a follow-up appointment.  This will likely be in the  next few weeks.     ______________________________  Terie Purser, MD      Bertram Millard. Dahlstedt, M.D.  Electronically Signed    JH/MEDQ  D:  11/21/2005  T:  11/21/2005  Job:  [id number]

## 2010-05-21 NOTE — Op Note (Signed)
[name], [name]               ACCOUNT NO.:  [id number]   MEDICAL RECORD NO.:  [id number]          PATIENT TYPE:  INP   LOCATION:  1613                         FACILITY:  [facility]   PHYSICIAN:  Bertram Millard. Dahlstedt, M.D.DATE OF BIRTH:  [birth year]   DATE OF PROCEDURE:  11/07/2005  DATE OF DISCHARGE:                               OPERATIVE REPORT   PREOPERATIVE DIAGNOSIS:  Right renal stones.   POSTOPERATIVE DIAGNOSIS:  Right renal stones.   PROCEDURE:  Right percutaneous nephrostolithotomy.   SURGEON:  Bertram Millard. Retta Diones, M.D.   ASSISTANT:  Terie Purser, MD.   INTERVENTIONAL RADIOLOGIST:  D. Oley Balm, M.D.   ANESTHESIA:  General.   SPECIMENS:  Stones for analysis.   BLOOD LOSS:  Minimal.   COMPLICATIONS:  None.   DRAINS:  28-French Council catheter, his right nephrostomy tube, 16-  French Foley catheter straight drain.   DISPOSITION:  Stable to post anesthesia care unit.   INDICATIONS FOR PROCEDURE:  [name] is a 70 year old gentleman who  has a history of kidney stones.  He had a history of a right staghorn  stone.  He was recently admitted with renal colic and also found to have  a left 6 mm ureteral stone.  His creatinine had increased.  He had  undergone bilateral ureteral stent placement.  He was stabilized and the  decision was made to treat his right-sided staghorn stone.  Benefits and  risks of procedure were explained, consent was obtained.   DESCRIPTION OF PROCEDURE:  The patient was brought to the operating  room.  He had previously undergone insertion of right percutaneous  nephrostomy tube by Dr. Deanne Coffer in the interventional radiology suite.  He was brought to the operating room and then administered general  anesthesia and placed in the prone position operating table prepped and  draped sterile fashion.  With the assistance of Dr. Deanne Coffer, we then  proceeded with dilation of the nephrostomy tract.  This was done over  two super stiff wires  which were placed the bladder.  The tract was  dilated and sheath was placed.  Then using the rigid nephroscope we  looked into the kidney.  We immediately encountered a large stone in the  renal pelvis.  Then using the lithoclast ultra the stone was fragmented  into multiple small pieces using ultrasonic and pneumatic device.  The  stone fragments were removed with the grasping forceps.  After  sufficient fragmentation was performed and all fragments were removed.  We then looked in the kidney using the flexible cystoscope.  There were  no significant remaining fragments.  We looked in the proximal ureter as  well.  There was no significant fragments remaining.  This point we  decided to terminate the procedure.  The flexible scope was removed.  A  22-French Council was placed over the existing working wire and their  position was confirmed with fluoroscopy and contrast to ensure that the  tip was in the renal pelvis.  Approximately 4 mL was inserted into the  balloon.  The sheath was removed and the catheter was secured at the  skin using two silk sutures.  Repeat fluoroscopy confirmed the tube was  in proper position.  Tube was connected to drainage.  A sterile dressing  was applied.  There were no complications.  Blood loss was minimal.  The  patient was then awoken from anesthesia and transferred recovery room in  stable condition.  Please note Dr. Retta Diones was present, participated  in all aspects of this procedure.     ______________________________  Terie Purser, MD      Bertram Millard. Dahlstedt, M.D.  Electronically Signed    JH/MEDQ  D:  11/07/2005  T:  11/08/2005  Job:  [id number]

## 2010-05-21 NOTE — Op Note (Signed)
[name], [name]                         ACCOUNT NO.:  [id number]   MEDICAL RECORD NO.:  [id number]                   PATIENT TYPE:  INP   LOCATION:  X002                                 FACILITY:  [facility]   PHYSICIAN:  Sharlet Salina T. Hoxworth, M.D.          DATE OF BIRTH:  [birth year]   DATE OF PROCEDURE:  12/23/2002  DATE OF DISCHARGE:                                 OPERATIVE REPORT   PREOPERATIVE DIAGNOSIS:  Left inguinal hernia.   POSTOPERATIVE DIAGNOSIS:  Left inguinal hernia.   OPERATION/PROCEDURE:  Repair of left inguinal hernia with mesh.   SURGEON:  Lorne Skeens. Hoxworth, M.D.   ANESTHESIA:  Laryngeal mask, general.   BRIEF HISTORY:  [name] is a 70 year old black male who presents with  a symptomatic left inguinal hernia confirmed by exam.  Repair with mesh had  been recommended and accepted.  This will be done in conjunction with  urologic procedures by Dr. Retta Diones.   The nature of the procedure, indications, risks of bleeding, infection, and  recurrence were discussed and understood preoperatively.   DESCRIPTION OF PROCEDURE:  Following completion of Dr. Lenoria Chime  procedures, the left groin was sterilely prepped and draped.  He had  received preoperative antibiotics.  An oblique incision was made in the left  groin and dissection was carried down through the subcutaneous tissue using  cautery.  The external oblique was identified, cleared down to the external  ring and divided along the lines of its fibers.  The ilioinguinal nerve was  identified and protected.  The cord was dissected off the floor of the pubic  tubercle.  The cord was completed freed back to the internal ring, dividing  the cremasteric fibers.  There was a good sized direct defect in the  herniated preperitoneal fat.  Transversalis fascia was dissected away from  the cord structures and reduced.  There was no indirect sac present on  exploration of the cord.  The floor of the inguinal  canal was then  imbricated with running 2-0 Prolene.  A piece of Parietex mesh was then  trimmed to size to fit the floor of the canal with tails around the cord and  internal ring.  It was sutured initially to the pubic tubercle and then to  the iliopubic tract and inguinal ligament, working medial to lateral with  running 2-0 Prolene.  Medially the mesh was sutured to the edge of the  rectus sheath with interrupted 2-0 Prolene.  The tails were then tacked  together for lateral support with Prolene creating a new internal ring,  snugged with fingertip.  The cord and ilioinguinal nerves were returned to  their anatomic position.  The muscle and soft tissue were infiltrated with  Marcaine.  The external oblique was closed with running 3-0 Vicryl.  Scarpa's fascia was closed with running 3-0 Vicryl and the skin with running  subcuticular 4-0 Monocryl and Steri-Strips.  Sponge, needle and instrument  counts were correct.  Dry sterile dressing was applied.  The patient was  taken to recovery in good condition.                                               Lorne Skeens. Hoxworth, M.D.   Tory Emerald  D:  12/23/2002  T:  12/23/2002  Job:  [id number]

## 2010-05-21 NOTE — Op Note (Signed)
NAMEHANSFORD, [name]                         ACCOUNT NO.:  [id number]   MEDICAL RECORD NO.:  [id number]                   PATIENT TYPE:  AMB   LOCATION:  NESC                                 FACILITY:  [facility]   PHYSICIAN:  Bertram Millard. Dahlstedt, M.D.          DATE OF BIRTH:  1940/01/22   DATE OF PROCEDURE:  DATE OF DISCHARGE:                                 OPERATIVE REPORT   PREOPERATIVE DIAGNOSIS:  Left ureteral and renal calculi, right staghorn  calculus.   POSTOPERATIVE DIAGNOSIS:  Left ureteral and renal calculi, right staghorn  calculus.   PRINCIPAL PROCEDURE:  Cystoscopy, bilateral retrograde ureteral pyelograms,  left ureterorenoscopy with holmium laser fragmentation and extraction of  ureteral and renal calculi.   SURGEON:  Bertram Millard. Dahlstedt, M.D.   ANESTHESIA:  General with LMA.   COMPLICATIONS:  None.   BRIEF HISTORY:  A 70 year old male who was seen by me last year initially.  At that time, he had hematuria and lower urinary tract symptoms.  He  underwent a TURP.  He still has some mild voiding symptoms.  He was found to  have a large right staghorn calculus.  After his TURP, it was recommended  that the patient have percutaneous nephrolithotomy.  He wanted to have a  less invasive procedure, and he had a stent placement followed by  lithotripsy.  He has not followed up adequately since that time, despite my  letters and phone calls.  He presented to my office emergently yesterday  with left flank pain.  He was found to have, in addition to his right  staghorn calculus, an obstructing left proximal ureteral stone and a left  lower pole renal calculus.   The patient asked me to perform urgent treatment of this left stone due to  his pain.  He was added on to the schedule this morning.  I have recommended  ureteroscopy with holmium laser fragmentation of his ureteral and renal  calculi.  He is aware of risks and complications and desires to proceed.   DESCRIPTION OF PROCEDURE:  The patient was administered preoperative IV  antibiotics and taken to the operating room where general anesthetic was  administered using the LMA.  He was placed in a dorsal lithotomy position.  Genitalia and perineum were prepped and draped.  A 22-French panendoscope  was advanced into his bladder, which was essentially normal.  Ureteral  orifices were quite near the bladder neck.  He had a bit of a median lobe on  the right lateral aspect of his bladder neck, which was not obstructing.  Both ureteral orifices were cannulated, and retrogrades were performed.  There was no right ureteral obstruction, but a large filling defect in the  renal pelvis and calyces were seen.  On the left, there was an obstructing  stone at the proximal ureter.  A guide wire was advanced past the stone.  At  this point, a rigid ureteroscope was  advanced up to the stone.  Using the  laser fiber, the holmium laser was used to fragment the stone into several  pieces, which then washed up into the kidney.  I then removed the rigid  ureteroscope and passed a flexible ureteroscope.  I then further fragmented  the stones.  There was also a larger stone in the lower pole, which was  fragmented.  These were fragmented small enough where I could extract at  least three of the larger fragments.  I felt the remaining fragments were  small enough to pass without further fragmentation.  At this point, after  stone extractions were performed, a stent was not left in at the patient's  request.  The bladder was drained and the guide wire removed.   The patient tolerated the procedure well.  He was awakened, extubated, and  taken to the PACU in stable condition.   He will be discharged on Cipro, which he already has at home.  He will take  one on Friday, the 27th, and one on Saturday, the 28th.  He also has  Percocet at home.  He will follow up in 1 week.                                                Bertram Millard. Dahlstedt, M.D.    SMD/MEDQ  D:  05/29/2003  T:  05/29/2003  Job:  [id number]

## 2010-05-21 NOTE — Discharge Summary (Signed)
NAMEEDSEL, SHIVES                         ACCOUNT NO.:  [id number]   MEDICAL RECORD NO.:  [id number]                   PATIENT TYPE:  INP   LOCATION:  0367                                 FACILITY:  [facility]   PHYSICIAN:  Bertram Millard. Dahlstedt, M.D.          DATE OF BIRTH:  05/25/1940   DATE OF ADMISSION:  12/23/2002  DATE OF DISCHARGE:  12/25/2002                                 DISCHARGE SUMMARY   DIAGNOSES:  1. Benign prostatic hypertrophy.  2. Urethral stricture.  3. Inguinal hernia.  4. Hypertension.  5. Right staghorn calculus.   PRINCIPAL PROCEDURES:  On date of admission, December 23, 2002, inguinal  hernia repair, transurethral resection of the prostate, visual internal  urethrotomy.   BRIEF HISTORY:  A 70 year old male who presented to my office with  hematuria.  Evaluation found the patient to have an obstructive prostate, a  urethral stricture, a large right renal stone, and a symptomatic inguinal  hernia.  It was recommended at the time that he undergo resection of his  prostate, as he had fairly significant lower urinary tract symptoms.  Additionally, it was recommended that he undergo left inguinal hernia  repair.   At the present time, he presents for incision of his urethral stricture,  transurethral resection of his prostate, and left inguinal hernia repair by  Dr. Johna Sheriff.   PAST MEDICAL HISTORY:  Significant for hypertension.  There is no cardiac  history.  He has a history of lumbar surgery for a herniated nucleus  pulposus.  He has a history of depression.   MEDICATION:  1. Atenolol.  2. Vicodin for back pain.  3. Xanax one-half tablet t.i.d.   ALLERGIES:  He gets hives from SULFA.   SOCIAL HISTORY:  He is married.  He is retired, recently having moved from  [place].  He currently denies tobacco abuse.   REVIEW OF SYSTEMS:  Significant for left groin pain, exacerbated with motion  and lifting.  He has significant lower urinary tract symptoms,  including  frequency, urgency, hesitancy, and slow stream.  Occasionally feels like he  emptying completely.  He denies any right back pain.   PHYSICAL EXAMINATION:  GENERAL:  A pleasant, healthy-appearing, middle-aged  male, in no distress.  VITAL SIGNS:  Blood pressure 158/90, pulse 56, respiratory rate 20,  temperature 96.8.  HEENT:  Essentially normal.  NECK:  Supple without thyromegaly or adenopathy.  CHEST:  Clear bilaterally.  HEART:  Regular rate and rhythm.  ABDOMEN:  Flat, soft nondistended, nontender.  No masses or megaly.  Easily  reducible left inguinal hernia present, which was somewhat tender.  GENITALIA:  External genitalia were normal.  RECTAL:  Normal anal sphincter tone with 2+ gland.  No nodules or  tenderness.  Epididymes and cords normal.  EXTREMITIES:  Normal.  NEUROLOGIC:  Grossly intact.   ADMISSION DATA:  EKG revealed sinus bradycardia with left atrial  enlargement, septal infarction, age undetermined.  Urinalysis was clear,  except for microscopic hematuria.  Hemogram was normal.  BMET was normal,  except for a glucose of 103.   HOSPITAL COURSE:  The patient was admitted directly to the operating room  where he underwent a left inguinal hernia repair and a transverse resection  of the prostate, in addition to incision of a urethral stricture.  He  tolerated these procedures well.  He was discharged home on postoperative  day #2.  He failed a voiding trial, and a catheter was replaced.  At the  time of discharge, he was eating a regular diet and ambulating.  The urine  had cleared somewhat.   DISCHARGE MEDICATIONS:  1. Atenolol.  2. Xanax.  3. Cipro 500 mg daily.  4. Vicodin as needed for pain.  5. AZO, available over-the-counter for urinary burning.   FOLLOW UP:  He will follow up with me in approximately one week for a  voiding trial.                                               Bertram Millard. Dahlstedt, M.D.    SMD/MEDQ  D:  01/13/2003  T:   01/13/2003  Job:  [id number]   cc:   Sharlet Salina T. Hoxworth, M.D.  [address], [address]  [place]  [place] [address]  Fax: [phone]   Jaclyn Prime. Lucas Mallow, M.D.  [address]  [place]  [place] [address]  Fax: [phone]

## 2010-05-21 NOTE — Op Note (Signed)
[name], FUSTER                         ACCOUNT NO.:  [id number]   MEDICAL RECORD NO.:  [id number]                   PATIENT TYPE:  AMB   LOCATION:  DFTL                                 FACILITY:  [facility]   PHYSICIAN:  Sandria Bales. Ezzard Standing, M.D.               DATE OF BIRTH:  [birth year]   DATE OF PROCEDURE:  12/10/2002  DATE OF DISCHARGE:                                 OPERATIVE REPORT   PREOPERATIVE DIAGNOSIS:  Change in bowel habits.   POSTOPERATIVE DIAGNOSIS:  Diverticulosis of the colon with a redundant  sigmoid colon otherwise negative.   PROCEDURE:  Flexible colonoscopy.   SURGEON:  Sandria Bales. Ezzard Standing, M.D.   FIRST ASSISTANT:  None.   ANESTHESIA:  75 mg of Demerol, 5 mg Versed.   COMPLICATIONS:  None.   INDICATIONS FOR PROCEDURE:  Mr. Fischman is a 70 year old black male who is  seeing Dr. Jaclynn Guarneri for a left inguinal hernia and Dr. Retta Diones for  some prostate trouble with anticipated surgery and possibly both.  He has  had a change in his bowel habits and Dr. Johna Sheriff requested I do an  endoscopy or colonoscopy for evaluation of his colon.   The patient has been on some hydrocodone for chronic back pain, this may  also may be contributing to his bowel habit change.  He has noticed no blood  in his stool.  He completed a GoLYTELY bowel prep at home, discussed with  him the indications and potential complications of the procedure.   The patient placed in the left lateral decubitus position, he had nasal O2  and had a blood pressure cuff on, pulse oximetry and EKG leads.  He had an  IV in his right wrist.  He was given initially 5 mg of Versed, 50 mg of  Demerol and given an additional 25 mg of Demerol during the procedure.   Flexible Olympus colonoscope was passed without difficulty up to the sigmoid  colon.  His sigmoid colon was fairly redundant and looped which made  advancing the scope somewhat difficult.  I was able to get the scope around  to the right colon  and could visualize ileocecal valve.  I saw a light in  the right lower quadrant and saw a depression in the colon on palpation of  his right lower quadrant.  His right colon, transverse colon and left colon  were all unremarkable for any mass or lesion.  In his sigmoid colon, he had  a moderate amount of diverticulosis, he had no real node narrowing, he had  no evidence of mucosal tumor.  The scope was withdrawn into the rectum and  the retroflexing was negative.   The patient will be discharged home today, instructed on a high fiber diet  and will leave his further management with doctors Hoxworth and Dahlstedt.  Sandria Bales. Ezzard Standing, M.D.    DHN/MEDQ  D:  12/10/2002  T:  12/11/2002  Job:  [id number]   cc:   Bertram Millard. Dahlstedt, M.D.  [address]  [place]  [place] [address]  Fax: [phone]   Lorne Skeens. Hoxworth, M.D.  [address], [address]  [place]  [place] [address]  Fax: [phone]   Dr. Heloise Beecham, [facility],   Babs Bertin, Dr.

## 2010-05-21 NOTE — Op Note (Signed)
NAMEJAIDEN, [name]               ACCOUNT NO.:  [id number]   MEDICAL RECORD NO.:  [id number]          PATIENT TYPE:  INP   LOCATION:  1613                         FACILITY:  [facility]   PHYSICIAN:  Boston Service, M.D.DATE OF BIRTH:  Jan 12, 1940   DATE OF PROCEDURE:  11/06/2005  DATE OF DISCHARGE:                                 OPERATIVE REPORT   INTERNAL MEDICINE:  Bertram Denver, M.D.   UROLOGY:  Bertram Millard. Dahlstedt, M.D.   PREOPERATIVE DIAGNOSIS:  70 year old black male, history of hypertension and  urolithiasis, right staghorn calculus has been present for quite some  time.  In the last 48 hours left renal calculus has moved to the left  proximal ureter.  Creatinine has risen from 1.7-3.1.   POSTOPERATIVE DIAGNOSIS:  70 year old black male, history of hypertension  and urolithiasis, right staghorn calculus has been present for quite some  time.  In the last 48 hours left renal calculus has moved to the left  proximal ureter.  Creatinine has risen from 1.7-3.1.   PROCEDURE:  Cystoscopy, retrograde right and left double-J stent.  Endoscopic photographs included.   SURGEON:  Boston Service, M.D.   ASSISTANT:  None.   ANESTHESIA:  General.   SPECIMENS:  None.   ESTIMATED BLOOD LOSS:  Minimal.   COMPLICATIONS:  None obvious.   DESCRIPTION OF PROCEDURE:  Appreciate clinical notes from the emergency  room, Doug Sou, M.D., Lorre Nick, MD.  Reference is made to H&P  from 11/06/2005.  The patient was taken to the operating room prepped and  draped in the dorsolithotomy position after institution of adequate level of  general anesthesia.  A well lubricated 21-French panendoscope was gently  inserted at the urethral meatus.  The patient had wide-mouth strictures of  the bulbous urethra that were gently dilated with soft catheters 16-French,  18 French, 20-French.  This allowed easy passage of the cystoscope.  Nonobstructive prostate consistent with prior TURP.   Bladder showed large  capacity.  Clear reflux right orifice minimal reflux left orifice.   Retrograde catheter was selected, positioned at the left ureteral orifice  with gentle injection of contrast, 6 mm calculus tightly impacted in the  left proximal ureter about a centimeter or two below the UPJ.  Similar  technique was used on the right side.  The patient had staghorn calculus  occupying the majority of the renal pelvis in the mid pole calyces with  dilated calyces in the upper and lower poles.  Once satisfactory, retrograde  films had been obtained.  Floppy tip guidewire was inserted at the left  ureteral orifice, negotiated beyond the stone, coiled in the dilated upper  pole calyces.  There was prompt hydronephrotic excretion from the left  ureteral orifice.  6-French 26 cm double-J stent was selected, passed over  the indwelling guidewire with excellent pigtail formation on guidewire  removal.  Similar technique was used on the right side.  Guidewire was  negotiated into the dilated upper pole calyces on the right.  Double-J stent  was advanced, excellent pigtail formation on guidewire removal.  Prompt  reflux of concentrated  urine through the fenestration so the double-J stent  20-French Foley was inserted, left to straight drain.  There appeared to be  a brisk diuresis.  The patient was returned to recovery in satisfactory  condition.           ______________________________  Boston Service, M.D.     RH/MEDQ  D:  11/06/2005  T:  11/07/2005  Job:  [id number]   cc:   Dr. Neta Mends M. Dahlstedt, M.D.  Fax: [phone]

## 2010-10-01 LAB — POCT URINALYSIS DIP (DEVICE)
Protein, ur: NEGATIVE
Specific Gravity, Urine: 1.02
Urobilinogen, UA: 0.2
pH: 5

## 2010-10-05 LAB — URINALYSIS, ROUTINE W REFLEX MICROSCOPIC
Bilirubin Urine: NEGATIVE
Ketones, ur: NEGATIVE mg/dL
Nitrite: NEGATIVE
Nitrite: NEGATIVE
Protein, ur: NEGATIVE mg/dL
Protein, ur: 30 — AB
Specific Gravity, Urine: 1.02 (ref 1.005–1.030)
Specific Gravity, Urine: 1.024
Urobilinogen, UA: 0.2 mg/dL (ref 0.0–1.0)
Urobilinogen, UA: 0.2

## 2010-10-05 LAB — CBC
Hemoglobin: 16.1 g/dL (ref 13.0–17.0)
MCHC: 33.9 g/dL (ref 30.0–36.0)
MCHC: 33.6
MCV: 99.6
Platelets: 165
RBC: 4.32
RDW: 12.6 % (ref 11.5–15.5)
RDW: 12.8

## 2010-10-05 LAB — POCT I-STAT, CHEM 8
BUN: 20
Calcium, Ion: 1.3
Chloride: 108
Creatinine, Ser: 2.2 — ABNORMAL HIGH
TCO2: 24

## 2010-10-05 LAB — DIFFERENTIAL
Basophils Absolute: 0 10*3/uL (ref 0.0–0.1)
Basophils Absolute: 0
Basophils Relative: 0 % (ref 0–1)
Basophils Relative: 0
Eosinophils Absolute: 0
Monocytes Absolute: 0.3 10*3/uL (ref 0.1–1.0)
Neutro Abs: 7 10*3/uL (ref 1.7–7.7)
Neutro Abs: 7.5
Neutrophils Relative %: 91 — ABNORMAL HIGH

## 2010-10-05 LAB — BASIC METABOLIC PANEL
CO2: 26 mEq/L (ref 19–32)
Calcium: 9.8 mg/dL (ref 8.4–10.5)
Creatinine, Ser: 2.09 mg/dL — ABNORMAL HIGH (ref 0.4–1.5)
Glucose, Bld: 135 mg/dL — ABNORMAL HIGH (ref 70–99)
Sodium: 139 mEq/L (ref 135–145)

## 2010-10-05 LAB — URINE MICROSCOPIC-ADD ON

## 2010-10-05 LAB — URINE CULTURE
Colony Count: NO GROWTH
Culture: NO GROWTH

## 2011-03-29 IMAGING — CT CT HEAD W/O CM
1 series · 16 of 30 positions shown, 20 images · non-contrast
Comparison: None

CLINICAL DATA: Headache.

CT HEAD WITHOUT CONTRAST
TECHNIQUE: Contiguous axial images were obtained from the base of
the skull through the vertex without contrast.

[Series 2: head routine 4.8 h37s · axial · 0.46mm/px · z∈[+155,+310]mm · 16 of 36 slices shown, 20 images]
[im 2/36  brain]
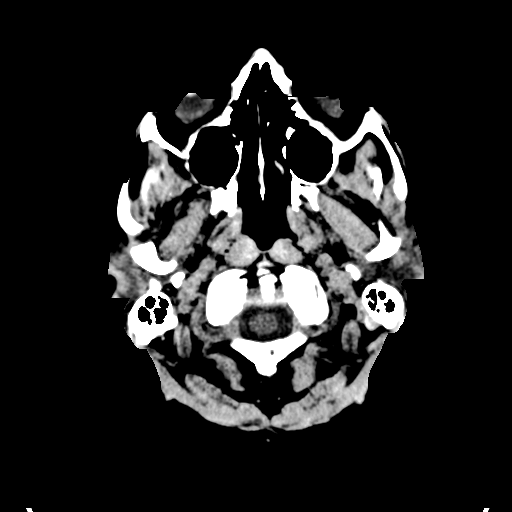
[im 2/36  bone]
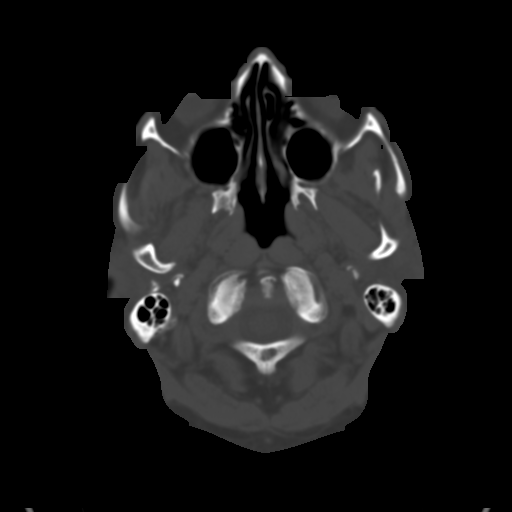
[im 4/36  brain]
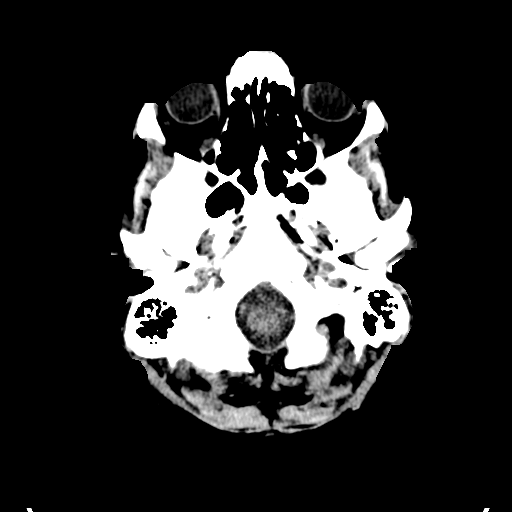
[im 7/36  brain]
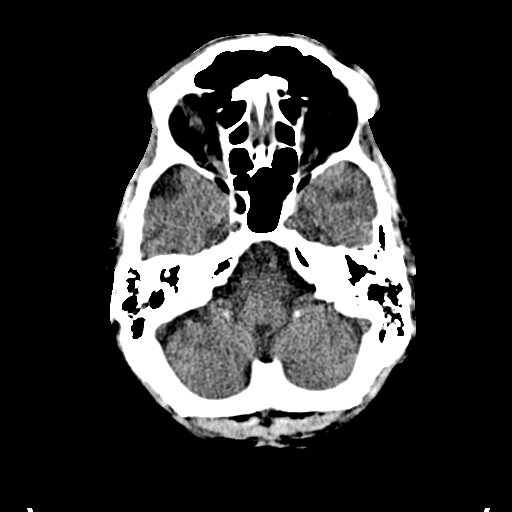
[im 9/36  brain]
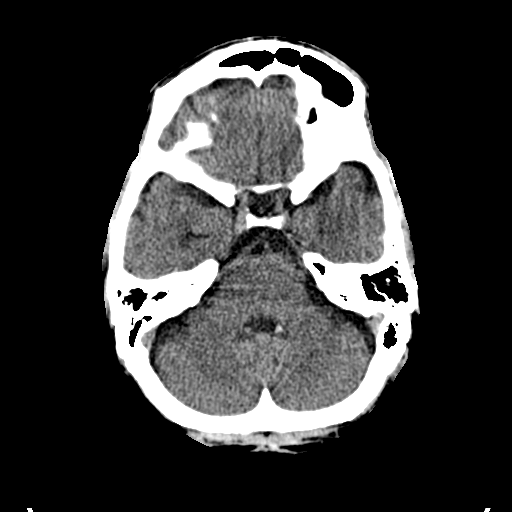
[im 10/36  brain]
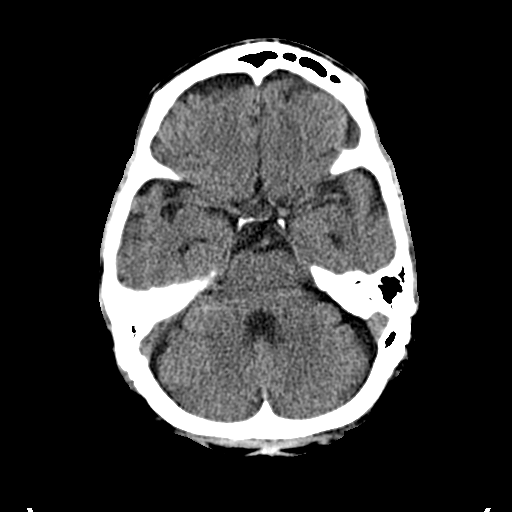
[im 10/36  bone]
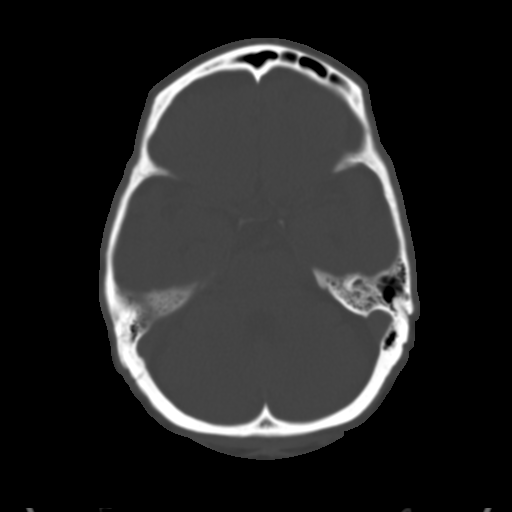
[im 13/36  brain]
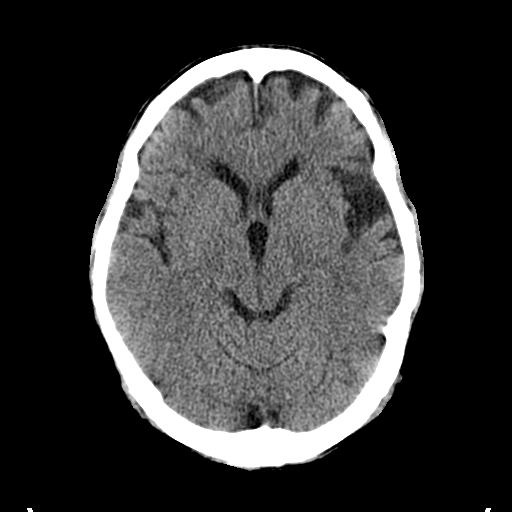
[im 15/36  brain]
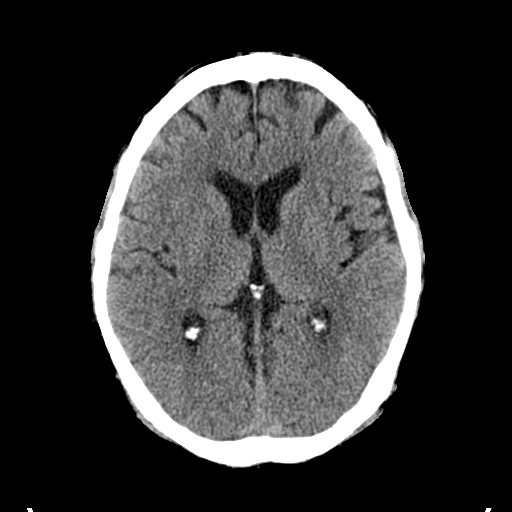
[im 17/36  brain]
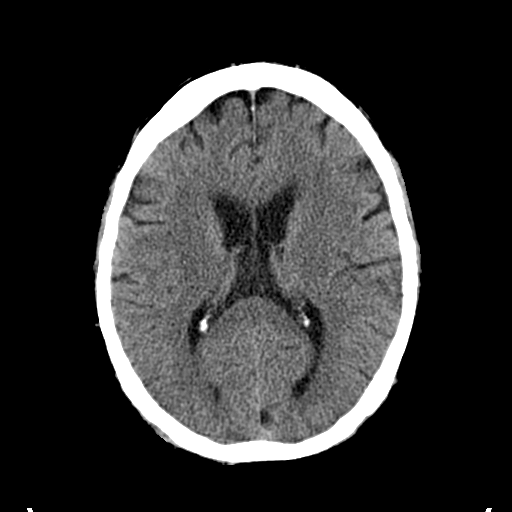
[im 19/36  brain]
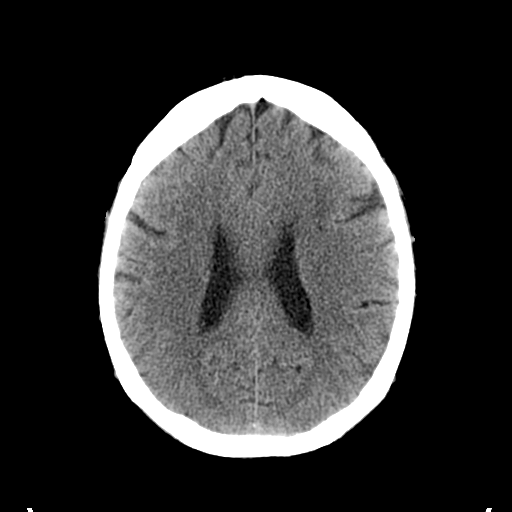
[im 19/36  bone]
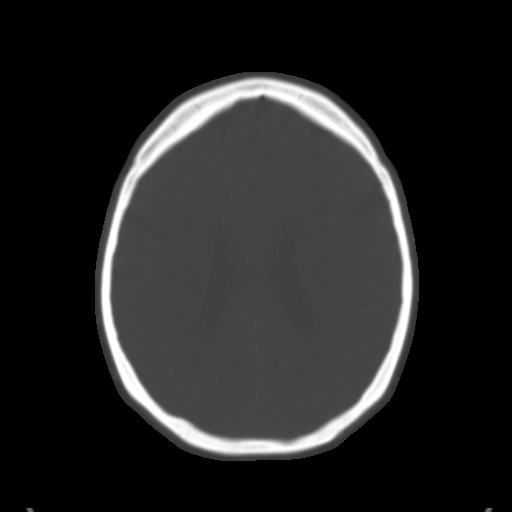
[im 21/36  brain]
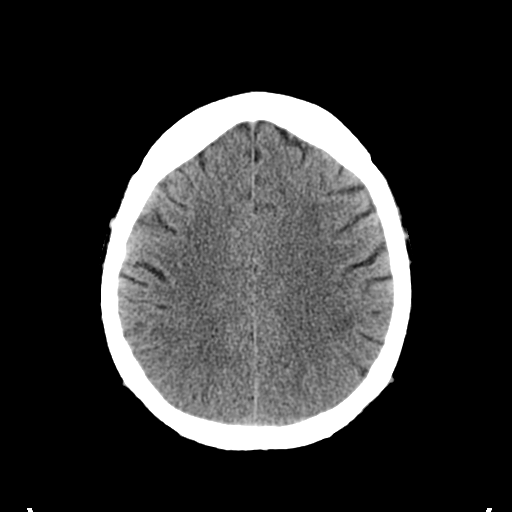
[im 23/36  brain]
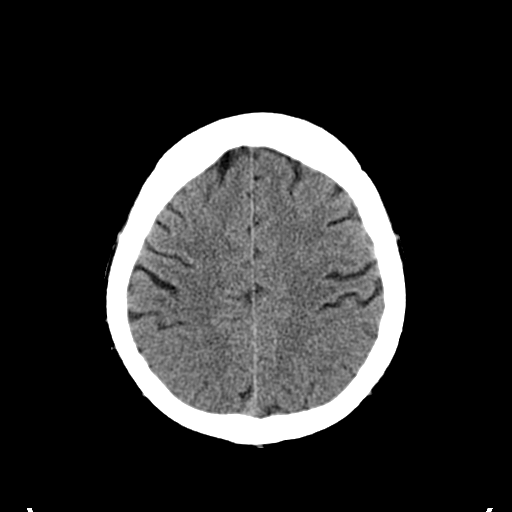
[im 26/36  brain]
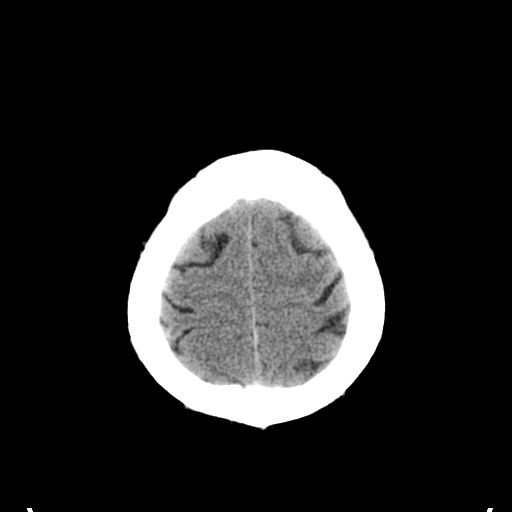
[im 27/36  brain]
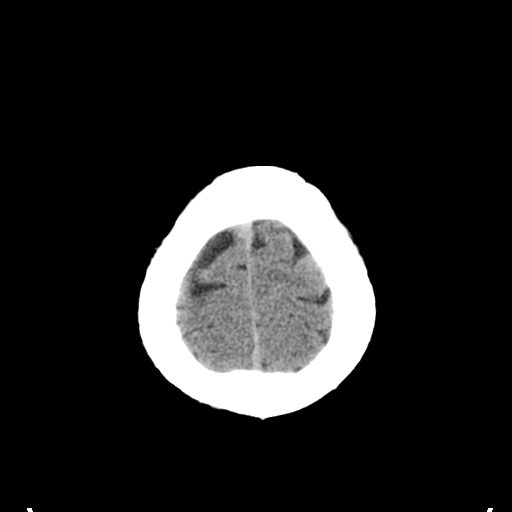
[im 27/36  bone]
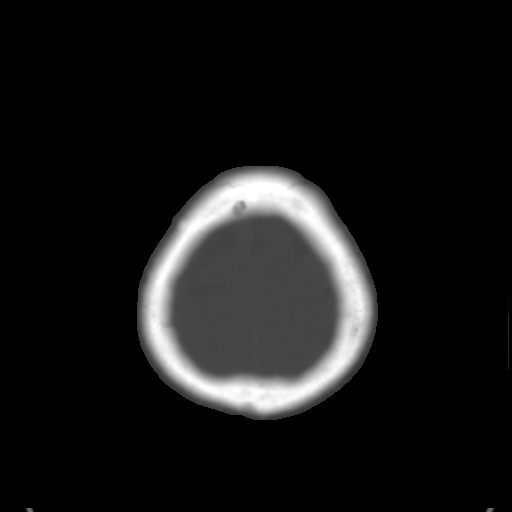
[im 29/36  brain]
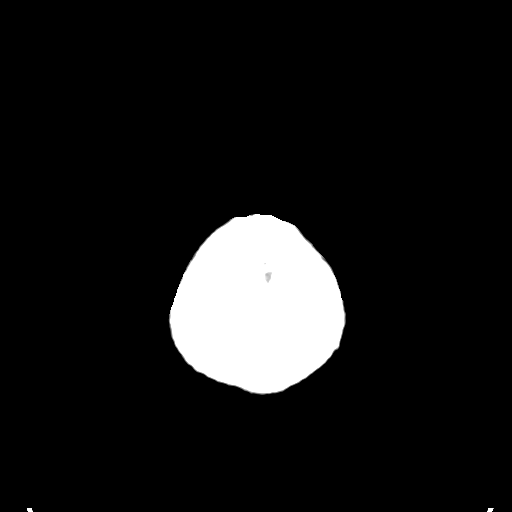
[im 32/36  brain]
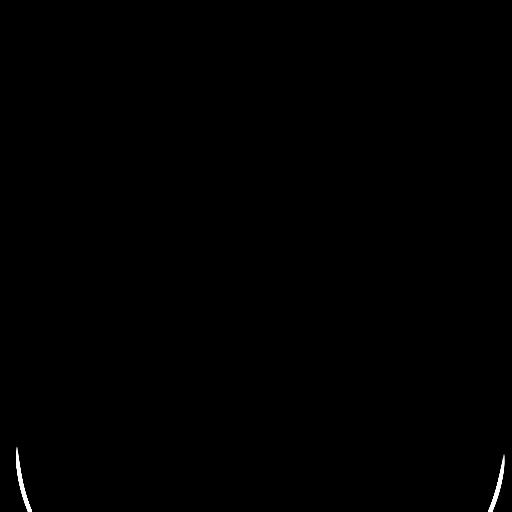
[im 34/36  brain]
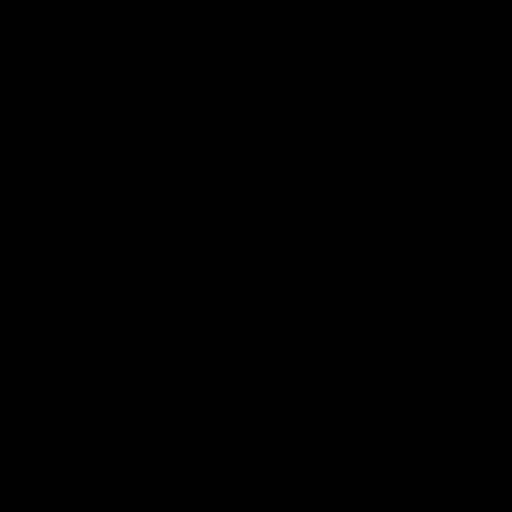

[16 of 30 positions shown; findings below may reference images not displayed]

FINDINGS: The ventricles are normal.  No extra-axial fluid
collections are seen.  The brainstem and cerebellum are
unremarkable.  No acute intracranial findings such as infarction or
hemorrhage.  No mass lesions.

The bony calvarium is intact.  The visualized paranasal sinuses and
mastoid air cells are clear.
IMPRESSION: No acute intracranial findings.

## 2011-05-20 ENCOUNTER — Emergency Department (HOSPITAL_COMMUNITY)
Admission: EM | Admit: 2011-05-20 | Discharge: 2011-05-20 | Disposition: A | Discharge status: Home / Self Care | Payer: Medicare Other | Attending: Emergency Medicine | Admitting: Emergency Medicine

## 2011-05-20 ENCOUNTER — Emergency Department (HOSPITAL_COMMUNITY): Payer: Medicare Other

## 2011-05-20 ENCOUNTER — Encounter (HOSPITAL_COMMUNITY): Payer: Self-pay | Admitting: Emergency Medicine

## 2011-05-20 DIAGNOSIS — R5381 Other malaise: Secondary | ICD-10-CM | POA: Insufficient documentation

## 2011-05-20 DIAGNOSIS — Z79899 Other long term (current) drug therapy: Secondary | ICD-10-CM | POA: Insufficient documentation

## 2011-05-20 DIAGNOSIS — N200 Calculus of kidney: Secondary | ICD-10-CM | POA: Insufficient documentation

## 2011-05-20 DIAGNOSIS — E785 Hyperlipidemia, unspecified: Secondary | ICD-10-CM | POA: Insufficient documentation

## 2011-05-20 DIAGNOSIS — R0602 Shortness of breath: Secondary | ICD-10-CM | POA: Insufficient documentation

## 2011-05-20 DIAGNOSIS — G8929 Other chronic pain: Secondary | ICD-10-CM | POA: Insufficient documentation

## 2011-05-20 DIAGNOSIS — I10 Essential (primary) hypertension: Secondary | ICD-10-CM | POA: Insufficient documentation

## 2011-05-20 DIAGNOSIS — R5383 Other fatigue: Secondary | ICD-10-CM | POA: Insufficient documentation

## 2011-05-20 HISTORY — DX: Essential (primary) hypertension: I10

## 2011-05-20 HISTORY — DX: Reserved for concepts with insufficient information to code with codable children: IMO0002

## 2011-05-20 LAB — URINALYSIS, ROUTINE W REFLEX MICROSCOPIC
Glucose, UA: NEGATIVE mg/dL
Ketones, ur: NEGATIVE mg/dL
Leukocytes, UA: NEGATIVE
Protein, ur: 30 mg/dL — AB
pH: 6 (ref 5.0–8.0)

## 2011-05-20 LAB — URINE MICROSCOPIC-ADD ON

## 2011-05-20 MED ORDER — KETOROLAC TROMETHAMINE 30 MG/ML IJ SOLN
30.0000 mg | Freq: Once | INTRAMUSCULAR | Status: AC
  Administered 2011-05-20: 30 mg via INTRAVENOUS
  Filled 2011-05-20: qty 1

## 2011-05-20 MED ORDER — OXYCODONE-ACETAMINOPHEN 7.5-500 MG PO TABS: 1.0000 | ORAL_TABLET | ORAL | Status: AC | PRN

## 2011-05-20 MED ORDER — MORPHINE SULFATE 4 MG/ML IJ SOLN
6.0000 mg | Freq: Once | INTRAMUSCULAR | Status: AC
  Administered 2011-05-20: 6 mg via INTRAVENOUS
  Filled 2011-05-20: qty 2

## 2011-05-20 NOTE — ED Provider Notes (Signed)
History     CSN: [id number]  Arrival date & time 05/20/11  1400   First MD Initiated Contact with Patient 05/20/11 1559      Chief Complaint: back and leg pain     HPI  Patient is a 71 year old gentleman with a past medical history of TIA, hypertension, hyperlipidemia, recurrent kidney stone, and benign prostate hypertrophy, and a possible glaucoma, who presents with lower back pain and leg pain.  Per patient, he has chronic lower back pain for 15-20 years, chronically managed well with the pain medication including hydrocodone. In past 4 days,  his lower back pain is progressively getting worse. It is started from the left lower back,  4/10 in severity, radiating down to his upper and the lower legs bilaterally. It is aggravated by walking and is not alleviated by any factors. It is associated with a numbness, tingling sensation and mild weakness in his legs bilaterally. Patient did not lose control of bladder or bowel movement. But he reports having increased urge to have a bowel movement in recent 3 days with normal stools.  he denies any symptoms for urinary tract infection, such as dysuria, urgency or frequency.  Denies fever, chills, fatigue, headaches,  cough, chest pain, SOB,  Hematuria.   Past Medical History  Diagnosis Date  . Kidney stones   . Herniated disc   . Hypertension     Past Surgical History  Procedure Date  . Kidney stone surgery     History reviewed. No pertinent family history.  History  Substance Use Topics  . Smoking status: Never Smoker   . Smokeless tobacco: Not on file  . Alcohol Use: No     Review of Systems  Constitutional: Positive for fatigue. Negative for fever, chills, diaphoresis and appetite change.  HENT: Negative for hearing loss, ear pain, nosebleeds, congestion, sore throat, facial swelling, rhinorrhea, sneezing, mouth sores, trouble swallowing, neck pain, neck stiffness, dental problem, voice change, postnasal drip, sinus pressure,  tinnitus and ear discharge.   Eyes: Negative for photophobia, pain, discharge, redness and itching.  Respiratory: Negative for apnea, cough, choking, chest tightness, shortness of breath, wheezing and stridor.   Cardiovascular: Negative for chest pain, palpitations and leg swelling.  Gastrointestinal: Negative for nausea, vomiting, abdominal pain, diarrhea, constipation, blood in stool, abdominal distention, anal bleeding and rectal pain.  Genitourinary: Positive for flank pain and difficulty urinating. Negative for dysuria, urgency, frequency, hematuria, discharge and enuresis.       Left side flan pain  Musculoskeletal: Positive for back pain. Negative for myalgias, joint swelling and gait problem.  Neurological: Positive for weakness and numbness. Negative for dizziness, tremors, seizures, syncope, facial asymmetry, speech difficulty, light-headedness and headaches.  Hematological: Negative for adenopathy. Does not bruise/bleed easily.  Psychiatric/Behavioral: Negative for suicidal ideas, hallucinations, behavioral problems, confusion and agitation.      Allergies  Sulfa antibiotics  Home Medications   Current Outpatient Rx  Name Route Sig Dispense Refill  . ALPRAZOLAM 0.5 MG PO TABS Oral Take 0.5 mg by mouth at bedtime as needed. anxiety    . ATENOLOL 50 MG PO TABS Oral Take 50 mg by mouth daily.    Marland Kitchen HYDROCODONE-ACETAMINOPHEN 7.5-325 MG PO TABS Oral Take 1 tablet by mouth every 6 (six) hours as needed. Pain    . IBUPROFEN 200 MG PO TABS Oral Take 600 mg by mouth every 6 (six) hours as needed. pain    . POTASSIUM CITRATE ER 10 MEQ (1080 MG) PO TBCR Oral Take 10  mEq by mouth 3 (three) times daily with meals.    Marland Kitchen VITAMIN C 500 MG PO TABS Oral Take 500 mg by mouth daily.      BP 163/103  Pulse 102  Temp(Src) 98 F (36.7 C) (Oral)  Resp 21  SpO2 96%  Physical Exam  General: resting in bed, not in acute distress. HEENT: PERRL, EOMI, no scleral icterus Cardiac: S1/S2, RRR, No  murmurs, gallops or rubs Pulm: Good air movement bilaterally, Clear to auscultation bilaterally, No rales, wheezing, rhonchi or rubs. Abd: Soft,  nondistended, no rebound pain, no organomegaly, BS present. He has tenderness over his back when do deep palpation to his abdomen. No tenderness over CVA.  Ext: No rashes or edema, 2+DP/PT pulse bilaterally Musculoskeletal: tenderness over left paraspinal muscle Neuro: alert and oriented X3, cranial nerves II-XII grossly intact, muscle strength 5/5 in all extremeties,  sensation to light touch intact. 1+ brachial and knee reflex bilaterally ( symmetrical). Gait is normal though slow due to back pain.  Negative Babinski's sign.   ED Course  Procedures (including critical care time)  Patient's back pain and leg pain, numbness and tingling sensation is most likely due to his worsening chronic back pain, possibly due bulging disc (per patient, he has herniation of disc in the past). Other differential diagnosis including kidney stone given his history of recurrent of bilaterally kidney stones. It is less likely due to more serious issues, such as prostate cancer or MM (given no alarming symptosis, such as significant weigh loss, no lytic bone change on recent X-ray). Will check urin analysis and control his pain with morphine.     Labs Reviewed  URINALYSIS, ROUTINE W REFLEX MICROSCOPIC      MDM   Patient's urinalysis is positive for Hgb and Red blood cells with negative nitrite and leukocytes, indicating possible kidney stone. CT-scan results showed 9 mm partially obstructive calculus in the proximal third of the left ureter with mild proximal hydroureteronephrosis and perinephric stranding, also with additional nonobstructive calculi in the collecting systems bilaterally.   Patient was treated with IV Toradol and morphine for pain control in ED. Currently, his pain is tolerable. He will be discharged home on Percocet and let him be followed up with  his urologist, Dr. Retta Diones on Monday (5/20) and his PCP in one week.  Delorse Limber, MD 05/20/11 4098  Lorretta Harp, MD 05/20/11 2308

## 2011-05-20 NOTE — Discharge Instructions (Signed)
1. Please follow-up with your urologist, Dr. Retta Diones on 05/23/11. Please also follow up with Primary care physician in one week. 2. Please take all medications as prescribed.  3. If you have worsening of your symptoms or new symptoms arise, please call the clinic ([phone]), or go to the ER immediately if symptoms are severe.   Kidney Stones  Kidney stones (ureteral lithiasis) are deposits that form inside your kidneys. The intense pain is caused by the stone moving through the urinary tract. When the stone moves, the ureter goes into spasm around the stone. The stone is usually passed in the urine.  CAUSES   A disorder that makes certain neck glands produce too much parathyroid hormone (primary hyperparathyroidism).   A buildup of uric acid crystals.   Narrowing (stricture) of the ureter.   A kidney obstruction present at birth (congenital obstruction).   Previous surgery on the kidney or ureters.   Numerous kidney infections.  SYMPTOMS   Feeling sick to your stomach (nauseous).   Throwing up (vomiting).   Blood in the urine (hematuria).   Pain that usually spreads (radiates) to the groin.   Frequency or urgency of urination.  DIAGNOSIS   Taking a history and physical exam.   Blood or urine tests.   Computerized X-ray scan (CT scan).   Occasionally, an examination of the inside of the urinary bladder (cystoscopy) is performed.  TREATMENT   Observation.   Increasing your fluid intake.   Surgery may be needed if you have severe pain or persistent obstruction.  The size, location, and chemical composition are all important variables that will determine the proper choice of action for you. Talk to your caregiver to better understand your situation so that you will minimize the risk of injury to yourself and your kidney.  HOME CARE INSTRUCTIONS   Drink enough water and fluids to keep your urine clear or pale yellow.   Strain all urine through the provided strainer.  Keep all particulate matter and stones for your caregiver to see. The stone causing the pain may be as small as a grain of salt. It is very important to use the strainer each and every time you pass your urine. The collection of your stone will allow your caregiver to analyze it and verify that a stone has actually passed.   Only take over-the-counter or prescription medicines for pain, discomfort, or fever as directed by your caregiver.   Make a follow-up appointment with your caregiver as directed.   Get follow-up X-rays if required. The absence of pain does not always mean that the stone has passed. It may have only stopped moving. If the urine remains completely obstructed, it can cause loss of kidney function or even complete destruction of the kidney. It is your responsibility to make sure X-rays and follow-ups are completed. Ultrasounds of the kidney can show blockages and the status of the kidney. Ultrasounds are not associated with any radiation and can be performed easily in a matter of minutes.  SEEK IMMEDIATE MEDICAL CARE IF:   Pain cannot be controlled with the prescribed medicine.   You have a fever.   The severity or intensity of pain increases over 18 hours and is not relieved by pain medicine.   You develop a new onset of abdominal pain.   You feel faint or pass out.  MAKE SURE YOU:   Understand these instructions.   Will watch your condition.   Will get help right away if you are not  doing well or get worse.  Document Released: 12/20/2004 Document Revised: 12/09/2010 Document Reviewed: 04/17/2009 [facility] Patient Information 2012 [place], [place].

## 2011-05-20 NOTE — ED Notes (Addendum)
Pt has been having generalized body aches, weakness and sore throat for the past 4-5 days.  Pt also states he has episodes of SOB that come and go unaffected by activity level.  Pt states he has some nausea but no v/d.  Lungs sounds clear, radial pulses strong.

## 2011-05-21 ENCOUNTER — Encounter (HOSPITAL_COMMUNITY): Payer: Self-pay | Admitting: *Deleted

## 2011-05-21 ENCOUNTER — Emergency Department (HOSPITAL_COMMUNITY): Payer: Medicare Other

## 2011-05-21 ENCOUNTER — Encounter (HOSPITAL_COMMUNITY)
Admission: EM | Disposition: A | Discharge status: Home / Self Care | Payer: Self-pay | Source: Home / Self Care | Attending: Urology

## 2011-05-21 ENCOUNTER — Inpatient Hospital Stay (HOSPITAL_COMMUNITY)
Admission: EM | Admit: 2011-05-21 | Discharge: 2011-05-23 | DRG: 683 | Disposition: A | Discharge status: Home / Self Care | Payer: Medicare Other | Attending: Urology | Admitting: Urology

## 2011-05-21 ENCOUNTER — Emergency Department (HOSPITAL_COMMUNITY): Payer: Medicare Other | Admitting: *Deleted

## 2011-05-21 DIAGNOSIS — N201 Calculus of ureter: Secondary | ICD-10-CM | POA: Diagnosis present

## 2011-05-21 DIAGNOSIS — I129 Hypertensive chronic kidney disease with stage 1 through stage 4 chronic kidney disease, or unspecified chronic kidney disease: Secondary | ICD-10-CM | POA: Diagnosis present

## 2011-05-21 DIAGNOSIS — R0789 Other chest pain: Secondary | ICD-10-CM | POA: Diagnosis present

## 2011-05-21 DIAGNOSIS — N189 Chronic kidney disease, unspecified: Secondary | ICD-10-CM | POA: Diagnosis present

## 2011-05-21 DIAGNOSIS — N3289 Other specified disorders of bladder: Secondary | ICD-10-CM | POA: Diagnosis present

## 2011-05-21 DIAGNOSIS — Z87442 Personal history of urinary calculi: Secondary | ICD-10-CM

## 2011-05-21 DIAGNOSIS — N200 Calculus of kidney: Secondary | ICD-10-CM

## 2011-05-21 DIAGNOSIS — R1310 Dysphagia, unspecified: Secondary | ICD-10-CM | POA: Diagnosis present

## 2011-05-21 DIAGNOSIS — N1330 Unspecified hydronephrosis: Secondary | ICD-10-CM | POA: Diagnosis present

## 2011-05-21 DIAGNOSIS — R0602 Shortness of breath: Secondary | ICD-10-CM | POA: Diagnosis present

## 2011-05-21 DIAGNOSIS — N179 Acute kidney failure, unspecified: Principal | ICD-10-CM | POA: Diagnosis present

## 2011-05-21 DIAGNOSIS — N289 Disorder of kidney and ureter, unspecified: Secondary | ICD-10-CM

## 2011-05-21 HISTORY — PX: OTHER SURGICAL HISTORY: SHX169

## 2011-05-21 LAB — COMPREHENSIVE METABOLIC PANEL
ALT: 26 U/L (ref 0–53)
AST: 31 U/L (ref 0–37)
Albumin: 3.5 g/dL (ref 3.5–5.2)
Alkaline Phosphatase: 57 U/L (ref 39–117)
Chloride: 104 mEq/L (ref 96–112)
Potassium: 5.3 mEq/L — ABNORMAL HIGH (ref 3.5–5.1)
Sodium: 134 mEq/L — ABNORMAL LOW (ref 135–145)
Total Bilirubin: 0.8 mg/dL (ref 0.3–1.2)
Total Protein: 6.8 g/dL (ref 6.0–8.3)

## 2011-05-21 LAB — CBC
HCT: 40.6 % (ref 39.0–52.0)
MCHC: 35.5 g/dL (ref 30.0–36.0)
RDW: 11.6 % (ref 11.5–15.5)
WBC: 10.9 10*3/uL — ABNORMAL HIGH (ref 4.0–10.5)

## 2011-05-21 LAB — PROTIME-INR: Prothrombin Time: 14.2 seconds (ref 11.6–15.2)

## 2011-05-21 LAB — POCT I-STAT TROPONIN I

## 2011-05-21 SURGERY — CYSTOSCOPY, WITH STENT INSERTION
Anesthesia: General | Site: Pelvis | Laterality: Left | Wound class: Clean Contaminated

## 2011-05-21 MED ORDER — CEFAZOLIN SODIUM 1-5 GM-% IV SOLN: 1.0000 g | INTRAVENOUS | Status: AC

## 2011-05-21 MED ORDER — ASPIRIN 81 MG PO CHEW
324.0000 mg | CHEWABLE_TABLET | Freq: Once | ORAL | Status: AC
  Administered 2011-05-21: 324 mg via ORAL
  Filled 2011-05-21: qty 4

## 2011-05-21 MED ORDER — SODIUM CHLORIDE 0.9 % IV SOLN
1000.0000 mL | INTRAVENOUS | Status: DC
  Administered 2011-05-21: 1000 mL via INTRAVENOUS

## 2011-05-21 MED ORDER — MORPHINE SULFATE 4 MG/ML IJ SOLN
4.0000 mg | Freq: Once | INTRAMUSCULAR | Status: AC
  Administered 2011-05-21: 4 mg via INTRAVENOUS
  Filled 2011-05-21: qty 1

## 2011-05-21 MED ORDER — BELLADONNA ALKALOIDS-OPIUM 16.2-60 MG RE SUPP
RECTAL | Status: AC
  Filled 2011-05-21: qty 1

## 2011-05-21 MED ORDER — NITROGLYCERIN 2 % TD OINT
1.0000 [in_us] | TOPICAL_OINTMENT | Freq: Four times a day (QID) | TRANSDERMAL | Status: DC
  Administered 2011-05-21: 1 [in_us] via TOPICAL
  Filled 2011-05-21: qty 30

## 2011-05-21 MED ORDER — IOHEXOL 300 MG/ML  SOLN
INTRAMUSCULAR | Status: AC
  Filled 2011-05-21: qty 1

## 2011-05-21 MED ORDER — LIDOCAINE HCL 2 % EX GEL
CUTANEOUS | Status: AC
  Filled 2011-05-21: qty 10

## 2011-05-21 MED ORDER — GI COCKTAIL ~~LOC~~
30.0000 mL | Freq: Once | ORAL | Status: AC
  Administered 2011-05-21: 30 mL via ORAL
  Filled 2011-05-21: qty 30

## 2011-05-21 SURGICAL SUPPLY — 23 items
ADAPTER CATH URET PLST 4-6FR (CATHETERS) ×2 IMPLANT
ADPR CATH URET STRL DISP 4-6FR (CATHETERS) ×1
BAG URINE LEG 500ML (DRAIN) ×1 IMPLANT
BAG URO CATCHER STRL LF (DRAPE) ×2 IMPLANT
BASKET ZERO TIP NITINOL 2.4FR (BASKET) IMPLANT
BSKT STON RTRVL ZERO TP 2.4FR (BASKET)
CATH FOLEY 2WAY SLVR  5CC 18FR (CATHETERS) ×1
CATH FOLEY 2WAY SLVR 5CC 18FR (CATHETERS) IMPLANT
CATH INTERMIT  6FR 70CM (CATHETERS) ×1 IMPLANT
CLOTH BEACON ORANGE TIMEOUT ST (SAFETY) ×2 IMPLANT
DRAPE CAMERA CLOSED 9X96 (DRAPES) ×2 IMPLANT
GLOVE BIOGEL PI IND STRL 7.5 (GLOVE) ×1 IMPLANT
GLOVE BIOGEL PI INDICATOR 7.5 (GLOVE) ×1
GLOVE ECLIPSE 7.5 STRL STRAW (GLOVE) ×2 IMPLANT
GOWN PREVENTION PLUS XLARGE (GOWN DISPOSABLE) ×2 IMPLANT
GOWN STRL NON-REIN LRG LVL3 (GOWN DISPOSABLE) ×2 IMPLANT
GUIDEWIRE ANG ZIPWIRE 038X150 (WIRE) IMPLANT
GUIDEWIRE STR DUAL SENSOR (WIRE) ×2 IMPLANT
MANIFOLD NEPTUNE II (INSTRUMENTS) ×2 IMPLANT
PACK CYSTO (CUSTOM PROCEDURE TRAY) ×2 IMPLANT
STENT CONTOUR 6FRX28X.038 (STENTS) ×1 IMPLANT
STENT PERCUFLEX 4.8FRX28 (STENTS) ×1 IMPLANT
TUBING CONNECTING 10 (TUBING) ×2 IMPLANT

## 2011-05-21 NOTE — Discharge Instructions (Signed)
DISCHARGE INSTRUCTIONS FOR KIDNEY STONES OR URETERAL STENT  MEDICATIONS:   1. DO NOT RESUME YOUR ASPIRIN, or any other medicines like ibuprofen, motrin, excedrin, advil, aleve, vitamin E, fish oil as these can all cause bleeding x 7 days.  2. Resume all your other meds from home - except do not take any other pain meds that you may have at home.  ACTIVITY 1. No strenuous activity x 1week 2. No driving while on narcotic pain medications 3. Drink plenty of water 4. Continue to walk at home - you can still get blood clots when you are at home, so keep active, but don't over do it. 5. May return to work in 3 days.  BATHING 1. You can shower.    SIGNS/SYMPTOMS TO CALL: 1. Please call us if you have a fever greater than 101.5, uncontrolled  nausea/vomiting, uncontrolled pain, dizziness, unable to urinate, chest pain, shortness of breath, leg swelling, leg pain, redness around wound, drainage from wound, or any other concerns or questions.  You can reach us at [phone].   

## 2011-05-21 NOTE — ED Notes (Signed)
Pt presents w/ LFlank pain seen for same s/s yesterday. Pt states rx' meds not relieving pain at home.

## 2011-05-21 NOTE — ED Provider Notes (Signed)
History    CSN: [id number] Arrival date & time 05/21/11  2029  First MD Initiated Contact with Patient 05/21/11 2057      Chief Complaint  Patient presents with  . Flank Pain    Patient is a 71 y.o. male presenting with flank pain. The history is provided by the patient.  Flank Pain This is a recurrent problem. The current episode started yesterday. The problem has been gradually worsening (it has been waxing and waning). Associated symptoms include abdominal pain. The symptoms are aggravated by nothing. The symptoms are relieved by nothing.  Pt was seen in the ED yesterday and was diagnosed with a kidney stone.  Pt has been taking the percocet and it has been helping but then he started having some pain in his chest today associated with some shortness of breath.  He felt like he had something in his throat.  He tried to swallow but it would not go away.  It lasted for several hours.  It started around 6 pm.  It has been lessening.  He was having some pain in the flank at the time but it was not that bed.  His flank pain radiates to the left lower abdomen.  Past Medical History  Diagnosis Date  . Kidney stones   . Herniated disc   . Hypertension     Past Surgical History  Procedure Date  . Kidney stone surgery     History reviewed. No pertinent family history.  History  Substance Use Topics  . Smoking status: Never Smoker   . Smokeless tobacco: Not on file  . Alcohol Use: No      Review of Systems  Gastrointestinal: Positive for abdominal pain.  Genitourinary: Positive for flank pain.  All other systems reviewed and are negative.    Allergies  Sulfa antibiotics  Home Medications   Current Outpatient Rx  Name Route Sig Dispense Refill  . ALPRAZOLAM 0.5 MG PO TABS Oral Take 0.5 mg by mouth at bedtime as needed. anxiety    . ATENOLOL 50 MG PO TABS Oral Take 50 mg by mouth daily.    . IBUPROFEN 200 MG PO TABS Oral Take 600 mg by mouth every 6 (six) hours as needed.  pain    . OXYCODONE-ACETAMINOPHEN 7.5-500 MG PO TABS Oral Take 1 tablet by mouth every 4 (four) hours as needed for pain. 30 tablet 0  . POTASSIUM CITRATE ER 10 MEQ (1080 MG) PO TBCR Oral Take 10 mEq by mouth 3 (three) times daily with meals.    Marland Kitchen VITAMIN C 500 MG PO TABS Oral Take 500 mg by mouth daily.      BP 161/101  Pulse 81  Temp(Src) 98.7 F (37.1 C) (Oral)  Resp 18  SpO2 96%  Physical Exam  Nursing note and vitals reviewed. Constitutional: He appears well-developed and well-nourished. No distress.  HENT:  Head: Normocephalic and atraumatic.  Right Ear: External ear normal.  Left Ear: External ear normal.  Eyes: Conjunctivae are normal. Right eye exhibits no discharge. Left eye exhibits no discharge. No scleral icterus.  Neck: Neck supple. No tracheal deviation present.  Cardiovascular: Normal rate, regular rhythm and intact distal pulses.   Pulmonary/Chest: Effort normal and breath sounds normal. No stridor. No respiratory distress. He has no wheezes. He has no rales.  Abdominal: Soft. Bowel sounds are normal. He exhibits no distension. There is tenderness (mild llq and luq, left flank). There is no rebound and no guarding.  Musculoskeletal: He exhibits no edema  and no tenderness.  Neurological: He is alert. He has normal strength. No sensory deficit. Cranial nerve deficit:  no gross defecits noted. He exhibits normal muscle tone. He displays no seizure activity. Coordination normal.  Skin: Skin is warm and dry. No rash noted.  Psychiatric: He has a normal mood and affect.    ED Course  Procedures (including critical care time)  Rate: 79  Rhythm: normal sinus rhythm  QRS Axis: normal  Intervals: normal  ST/T Wave abnormalities: borderline t wave changes  Conduction Disutrbances:none  Old EKG Reviewed: no sig changes since last tracing    Labs Reviewed  CBC - Abnormal; Notable for the following:    WBC 10.9 (*)    All other components within normal limits    COMPREHENSIVE METABOLIC PANEL - Abnormal; Notable for the following:    Sodium 134 (*)    Potassium 5.3 (*)    Glucose, Bld 124 (*)    BUN 47 (*)    Creatinine, Ser 3.82 (*)    GFR calc non Af Amer 15 (*)    GFR calc Af Amer 17 (*)    All other components within normal limits  PROTIME-INR  APTT  POCT I-STAT TROPONIN I   Ct Abdomen Pelvis Wo Contrast  05/20/2011  *RADIOLOGY REPORT*  Clinical Data: Generalized body aches.  Fatigue, and shortness of breath.  Lower back pain.  CT ABDOMEN AND PELVIS WITHOUT CONTRAST  Technique:  Multidetector CT imaging of the abdomen and pelvis was performed following the standard protocol without intravenous contrast.  Comparison: CT of abdomen and pelvis 01/14/2010.  Findings:  Lung Bases: Scarring in the lung bases bilaterally, similar to prior study from 01/14/2010.  Otherwise, unremarkable.  Abdomen/Pelvis:  Image 49 of series 2 demonstrates a 9 mm partially obstructing stone in the proximal third of the left ureter with mild proximal hydroureteronephrosis and a small amount of left- sided perinephric stranding.  Additional nonobstructive calculi are noted within the collecting systems of the kidneys bilaterally, particularly on the right where there are several large stones, the largest of which is in the lower pole collecting system of the right kidney measuring 1.1 cm.  There are numerous low attenuation lesions scattered throughout the hepatic parenchyma, which is similar in size, number and distribution compared to the prior examination.  Although none of these are technically characterize on today's examination, there is stability in size and appearance over time to suggest a benign etiology such as small cyst or biliary hamartomas.  The unenhanced appearance of the gallbladder, pancreas, spleen and bilateral adrenal glands is unremarkable.  There is no ascites or pneumoperitoneum and no pathologic distension of bowel.  No definite pathologic adenopathy  identified within the abdomen or pelvis.  There is extensive colonic diverticulosis, most pronounced in the region of the sigmoid colon, without surrounding inflammatory changes to suggest acute diverticulitis at this time.  Postoperative changes of TURP are noted in the prostate gland.  Urinary bladder is unremarkable.  A small right inguinal hernia containing only fat is incidentally noted.  Right scrotal calcification may represent a scrotal pearl.  Musculoskeletal: There are no aggressive appearing lytic or blastic lesions noted in the visualized portions of the skeleton.  IMPRESSION: 1.  9 mm partially obstructive calculus in the proximal third of the left ureter with mild proximal hydroureteronephrosis and perinephric stranding. 2.  Additional nonobstructive calculi in the collecting systems bilaterally, as above.  3.  Although not technically characterized on this examination, these are favored to  represent cysts or biliary hamartomas given their stability in size and appearance over time. 4.  Colonic diverticulosis without evidence to suggest acute diverticulitis. 5.  Small right inguinal hernia containing only fat. 6.  Status post TURP.  Original Report Authenticated By: Florencia Reasons, M.D.    MDM  The patient has of worsening renal failure. He is CT scan yesterday showed a large 9 mm stone that was partially obstructing the ureter with proximal hydroureteronephrosis and perinephric stranding. With his new onset renal insufficiency and persistent pain I will contact urology. Patient may require operative intervention.         Celene Kras, MD 05/21/11 2245

## 2011-05-21 NOTE — ED Notes (Signed)
RN to obtain labs with start of IV 

## 2011-05-21 NOTE — H&P (Signed)
Consultation Urology History and Physical Exam  CC: Left ureter stone  HPI: 71 year old male with a long history of nephrolithiasis. He was treated last year by my partner, Dr. Retta Diones, with a PCNL. Patient presented to the ER tonight with flank pain. It is located in his left side. It began 4 nights ago. It is sharp in nature. It radiates to his left abdomen. It is associated with nausea and emesis. He denies fever. It is improved with narcotics. He presented to the ER yesterday where a CT scan revealed a left proximal 9mm ureter stone. There was also left hydronephrosis. UA was positive for RBCs, but negative for LE or nitrites.  He has ARF with a Cr of 3.82; last Cr in this record was 1.9 in 16109.  Today we discussed the management of obstructing urinary stones. These options include observation with pain control, percutaneous nephrostomy tube, or cystoscopy with retrograde ureteral stent placement with possible ureteroscopy and laser lithotripsy.  We discussed the risks, benefits, alternatives and likelihood of achieving the patient's goals.  We discussed which options are relevant to this particular situation. We discussed the natural history of stones and the as well as the complications of untreated stones and the impact on quality of life without treatment as well as with each of the above listed treatments. We also discussed the efficacy of each treatment. With any of these management options I discussed the signs and symptoms of infection and the need for emergent treatment should these be experienced.   For observation I described the risks which include, but are not limited to, silent renal damage, life-threatening infection, need for emergent surgery, failure to pass stone, and pain.  For stent placement with or without ureteroscopy and lithotripsy I described the risks which include, but are not limited to, heart attack, stroke, pulmonary embolus, death, bleeding, infection, damage to  contiguous structures, positioning injury, ureteral stricture, ureteral avulsion, ureteral injury, need for ureteral stent, inability to perform ureteroscopy, need for an interval procedure, inability to clear stone burden, stent discomfort and pain.  For percutaneous nephrostomy tube I described the risks which including, but are not limited to, heart attack, stroke, pulmonary embolus, death, arterial venous fistula or malformation, need for embolization of kidney, loss of kidney or renal function.  The patient had the opportunity to ask questions to their stated satisfaction and voiced understanding.  He has had chest pain. This is "deep" in his chest and has be present for 5 months. He had it tonight around 20:00. It lasted several minutes. It was associated with SOB. This was evaluated by Dr. Lynelle Doctor. His EKG is unchanged from previously and his cardiac enzymes are negative. He states it is associated with dysphagia.    PMH: Past Medical History  Diagnosis Date  . Kidney stones   . Herniated disc   . Hypertension     PSH: Past Surgical History  Procedure Date  . Kidney stone surgery     Allergies: Allergies  Allergen Reactions  . Sulfa Antibiotics Hives    Medications:  (Not in a hospital admission)   Social History: History   Social History  . Marital Status: Married    Spouse Name: N/A    Number of Children: N/A  . Years of Education: N/A   Occupational History  . Not on file.   Social History Main Topics  . Smoking status: Never Smoker   . Smokeless tobacco: Not on file  . Alcohol Use: No  . Drug  Use: No  . Sexually Active: Yes    Birth Control/ Protection: None   Other Topics Concern  . Not on file   Social History Narrative  . No narrative on file    Family History: History reviewed. No pertinent family history.  Review of Systems: Positive: Chest pain, SOB, nausea, emesis. Negative: Fever, gross hematuria, changes in vision.  A further 10 point  review of systems was negative except what is listed in the HPI.  Physical Exam:  General: No acute distress.  Awake. Head:  Normocephalic.  Atraumatic. ENT:  EOMI.  Mucous membranes moist Neck:  Supple.  No lymphadenopathy. CV:  S1 present. S2 present. Regular rate. Pulmonary: Equal effort bilaterally.  Clear to auscultation bilaterally. Abdomen: Soft.  Non- tender to palpation. Skin:  Normal turgor.  No visible rash. Extremity: No gross deformity of bilateral upper extremities.  No gross deformity of    bilateral lower extremities. Neurologic: Alert. Appropriate mood.   Studies:  Recent Labs  [facility] 05/21/11 2201   HGB 14.4   WBC 10.9*   PLT 152    Recent Labs  Basename 05/21/11 2201   NA 134*   K 5.3*   CL 104   CO2 19   BUN 47*   CREATININE 3.82*   CALCIUM 8.9   GFRNONAA 15*   GFRAA 17*     Recent Labs  Basename 05/21/11 2201   INR 1.08   APTT 35     No components found with this basename: ABG:2    Assessment:  ARF. Left ureter stone.  Plan: -To OR for cystoscopy, left ureter stent placement. Informed consent obtained. -Admit afterward for hydration and monitoring of ARF. -No indication of cardiac dysfunction for now. If symptoms persist will consider further inpatient work up vs outpatient work up.

## 2011-05-21 NOTE — Anesthesia Preprocedure Evaluation (Addendum)
Anesthesia Evaluation  Patient identified by MRN, date of birth, ID band Patient awake    Reviewed: Allergy & Precautions, H&P , NPO status , Patient's Chart, lab work & pertinent test results  Airway Mallampati: II TM Distance: <3 FB Neck ROM: Full    Dental No notable dental hx.    Pulmonary neg pulmonary ROS,  breath sounds clear to auscultation  Pulmonary exam normal       Cardiovascular hypertension, Rhythm:Regular Rate:Normal     Neuro/Psych negative neurological ROS  negative psych ROS   GI/Hepatic negative GI ROS, Neg liver ROS,   Endo/Other  negative endocrine ROS  Renal/GU negative Renal ROS  negative genitourinary   Musculoskeletal negative musculoskeletal ROS (+)   Abdominal   Peds negative pediatric ROS (+)  Hematology negative hematology ROS (+)   Anesthesia Other Findings   Reproductive/Obstetrics negative OB ROS                          Anesthesia Physical Anesthesia Plan  ASA: II and Emergent  Anesthesia Plan: General   Post-op Pain Management:    Induction: Intravenous  Airway Management Planned: LMA and Oral ETT  Additional Equipment:   Intra-op Plan:   Post-operative Plan: Extubation in OR  Informed Consent: I have reviewed the patients History and Physical, chart, labs and discussed the procedure including the risks, benefits and alternatives for the proposed anesthesia with the patient or authorized representative who has indicated his/her understanding and acceptance.   Dental advisory given  Plan Discussed with: CRNA  Anesthesia Plan Comments:         Anesthesia Quick Evaluation

## 2011-05-21 NOTE — ED Notes (Signed)
EKG given to EDP, Knapp,J. 

## 2011-05-21 NOTE — Brief Op Note (Signed)
05/21/2011 - 05/22/2011  11:40 PM  PATIENT:  [name]  71 y.o. male  PRE-OPERATIVE DIAGNOSIS:  left ureteral obstruction  POST-OPERATIVE DIAGNOSIS:  same  PROCEDURE:  Procedure(s) (LRB): CYSTOSCOPY WITH STENT PLACEMENT (Left)  SURGEON:  Surgeon(s) and Role:    * Milford Cage, MD - Primary  PHYSICIAN ASSISTANT:   ASSISTANTS: none   ANESTHESIA:   general  EBL:   None  BLOOD ADMINISTERED:none  DRAINS: Urinary Catheter (Foley)   LOCAL MEDICATIONS USED:  10 cc urethral lidocaine jelly. B&O suppository.  SPECIMEN:  No Specimen  DISPOSITION OF SPECIMEN:  N/A  COUNTS:  YES  TOURNIQUET:  * No tourniquets in log *  DICTATION: .Other Dictation: Dictation Number [id number]  PLAN OF CARE: Admit for overnight observation  PATIENT DISPOSITION:  PACU - hemodynamically stable.   Delay start of Pharmacological VTE agent (>24hrs) due to surgical blood loss or risk of bleeding: no

## 2011-05-22 ENCOUNTER — Encounter (HOSPITAL_COMMUNITY): Payer: Self-pay | Admitting: *Deleted

## 2011-05-22 LAB — BASIC METABOLIC PANEL
Calcium: 8.9 mg/dL (ref 8.4–10.5)
Creatinine, Ser: 3.37 mg/dL — ABNORMAL HIGH (ref 0.50–1.35)
GFR calc Af Amer: 20 mL/min — ABNORMAL LOW (ref >90)
GFR calc non Af Amer: 17 mL/min — ABNORMAL LOW (ref >90)

## 2011-05-22 LAB — CBC
MCV: 96.2 fL (ref 78.0–100.0)
Platelets: 148 10*3/uL — ABNORMAL LOW (ref 150–400)
RDW: 11.6 % (ref 11.5–15.5)
WBC: 9.3 10*3/uL (ref 4.0–10.5)

## 2011-05-22 LAB — CARDIAC PANEL(CRET KIN+CKTOT+MB+TROPI)
CK, MB: 5.1 ng/mL — ABNORMAL HIGH (ref 0.3–4.0)
Total CK: 138 U/L (ref 7–232)
Troponin I: <0.3 ng/mL (ref <0.30)

## 2011-05-22 MED ORDER — CEFAZOLIN SODIUM 1-5 GM-% IV SOLN
INTRAVENOUS | Status: DC | PRN
  Administered 2011-05-21: 1 g via INTRAVENOUS

## 2011-05-22 MED ORDER — SENNOSIDES-DOCUSATE SODIUM 8.6-50 MG PO TABS
1.0000 | ORAL_TABLET | Freq: Two times a day (BID) | ORAL | Status: DC
  Administered 2011-05-22 – 2011-05-23 (×3): 1 via ORAL
  Filled 2011-05-22 (×4): qty 1

## 2011-05-22 MED ORDER — LIDOCAINE HCL 2 % EX GEL
CUTANEOUS | Status: DC | PRN
  Administered 2011-05-22: 1

## 2011-05-22 MED ORDER — PROMETHAZINE HCL 25 MG/ML IJ SOLN: 6.2500 mg | INTRAMUSCULAR | Status: DC | PRN

## 2011-05-22 MED ORDER — SODIUM CHLORIDE 0.9 % IV SOLN
INTRAVENOUS | Status: DC | PRN
  Administered 2011-05-21: via INTRAVENOUS

## 2011-05-22 MED ORDER — FENTANYL CITRATE 0.05 MG/ML IJ SOLN
INTRAMUSCULAR | Status: DC | PRN
  Administered 2011-05-21 – 2011-05-22 (×3): 50 ug via INTRAVENOUS

## 2011-05-22 MED ORDER — ACETAMINOPHEN 10 MG/ML IV SOLN
1000.0000 mg | Freq: Four times a day (QID) | INTRAVENOUS | Status: AC
  Administered 2011-05-22 (×3): 1000 mg via INTRAVENOUS
  Filled 2011-05-22 (×4): qty 100

## 2011-05-22 MED ORDER — PROPOFOL 10 MG/ML IV EMUL
INTRAVENOUS | Status: DC | PRN
  Administered 2011-05-22: 2 mg via INTRAVENOUS
  Administered 2011-05-22: 175 mg via INTRAVENOUS

## 2011-05-22 MED ORDER — SODIUM CHLORIDE 0.9 % IV SOLN
INTRAVENOUS | Status: DC
  Administered 2011-05-22 – 2011-05-23 (×4): via INTRAVENOUS

## 2011-05-22 MED ORDER — HYDROMORPHONE HCL PF 1 MG/ML IJ SOLN: 0.2500 mg | INTRAMUSCULAR | Status: DC | PRN

## 2011-05-22 MED ORDER — CEFAZOLIN SODIUM 1-5 GM-% IV SOLN
1.0000 g | Freq: Three times a day (TID) | INTRAVENOUS | Status: AC
  Administered 2011-05-22 (×2): 1 g via INTRAVENOUS
  Filled 2011-05-22 (×2): qty 50

## 2011-05-22 MED ORDER — BELLADONNA ALKALOIDS-OPIUM 16.2-60 MG RE SUPP
RECTAL | Status: DC | PRN
  Administered 2011-05-22: 1 via RECTAL

## 2011-05-22 MED ORDER — ALPRAZOLAM 0.5 MG PO TABS
0.5000 mg | ORAL_TABLET | Freq: Two times a day (BID) | ORAL | Status: DC | PRN
  Administered 2011-05-22 – 2011-05-23 (×2): 0.5 mg via ORAL
  Filled 2011-05-22 (×2): qty 1

## 2011-05-22 MED ORDER — MORPHINE SULFATE 2 MG/ML IJ SOLN: 2.0000 mg | INTRAMUSCULAR | Status: DC | PRN

## 2011-05-22 MED ORDER — BISACODYL 10 MG RE SUPP: 10.0000 mg | Freq: Every day | RECTAL | Status: DC | PRN

## 2011-05-22 MED ORDER — OXYCODONE HCL 5 MG PO TABS
5.0000 mg | ORAL_TABLET | ORAL | Status: DC | PRN
  Administered 2011-05-22 (×2): 10 mg via ORAL
  Administered 2011-05-22 (×2): 5 mg via ORAL
  Administered 2011-05-23 (×2): 10 mg via ORAL
  Filled 2011-05-22: qty 2
  Filled 2011-05-22: qty 1
  Filled 2011-05-22 (×3): qty 2
  Filled 2011-05-22: qty 1

## 2011-05-22 MED ORDER — ACETAMINOPHEN 325 MG PO TABS: 650.0000 mg | ORAL_TABLET | ORAL | Status: DC | PRN

## 2011-05-22 MED ORDER — CEFAZOLIN SODIUM 1-5 GM-% IV SOLN
INTRAVENOUS | Status: AC
  Filled 2011-05-22: qty 50

## 2011-05-22 MED ORDER — BACITRACIN-NEOMYCIN-POLYMYXIN 400-5-5000 EX OINT: 1.0000 "application " | TOPICAL_OINTMENT | Freq: Three times a day (TID) | CUTANEOUS | Status: DC | PRN

## 2011-05-22 MED ORDER — ATENOLOL 50 MG PO TABS
50.0000 mg | ORAL_TABLET | Freq: Every day | ORAL | Status: DC
  Administered 2011-05-22 – 2011-05-23 (×2): 50 mg via ORAL
  Filled 2011-05-22 (×2): qty 1

## 2011-05-22 MED ORDER — ONDANSETRON HCL 4 MG/2ML IJ SOLN: 4.0000 mg | INTRAMUSCULAR | Status: DC | PRN

## 2011-05-22 MED ORDER — HEPARIN SODIUM (PORCINE) 5000 UNIT/ML IJ SOLN
5000.0000 [IU] | Freq: Three times a day (TID) | INTRAMUSCULAR | Status: DC
  Administered 2011-05-22 – 2011-05-23 (×4): 5000 [IU] via SUBCUTANEOUS
  Filled 2011-05-22 (×7): qty 1

## 2011-05-22 MED ORDER — ONDANSETRON HCL 4 MG/2ML IJ SOLN
INTRAMUSCULAR | Status: DC | PRN
  Administered 2011-05-22 (×2): 2 mg via INTRAVENOUS

## 2011-05-22 MED ORDER — LIDOCAINE HCL (CARDIAC) 20 MG/ML IV SOLN
INTRAVENOUS | Status: DC | PRN
  Administered 2011-05-22: 75 mg via INTRAVENOUS

## 2011-05-22 MED ORDER — IBUPROFEN 600 MG PO TABS
600.0000 mg | ORAL_TABLET | Freq: Four times a day (QID) | ORAL | Status: DC | PRN
  Filled 2011-05-22 (×2): qty 1

## 2011-05-22 MED ORDER — SALINE SPRAY 0.65 % NA SOLN
1.0000 | NASAL | Status: DC | PRN
  Filled 2011-05-22: qty 44

## 2011-05-22 MED ORDER — DIPHENHYDRAMINE HCL 25 MG PO CAPS
25.0000 mg | ORAL_CAPSULE | Freq: Three times a day (TID) | ORAL | Status: DC | PRN
  Administered 2011-05-22: 25 mg via ORAL
  Filled 2011-05-22: qty 1

## 2011-05-22 NOTE — Preoperative (Signed)
Beta Blockers   Reason not to administer Beta Blockers:Not Applicablept took beta blocker this a.m. 

## 2011-05-22 NOTE — Progress Notes (Addendum)
Urology Progress Note  Subjective:     No acute urologic events overnight. Has some bladder spasms, but not too bothersome. Pain controlled. No nausea. ARF improved slightly.  Patient complains of some chest pain only when asked. He states it occurred this morning. It lasted only a few minutes. It is in the epigastric area and feels like he needs to burp. Not associated with SOB. Nothing makes it better or worse. It does not radiate to his neck or arms or shoulders.  ROS: Negative: SOB  Objective:  Patient Vitals for the past 24 hrs:  BP Temp Temp src Pulse Resp SpO2 Height Weight  05/22/11 0605 144/82 mmHg 98.2 F (36.8 C) Oral 74  20  96 % - -  05/22/11 0130 147/96 mmHg - - 72  - 100 % - -  05/22/11 0128 - 98.4 F (36.9 C) - - - - - -  05/22/11 0125 165/95 mmHg 98.5 F (36.9 C) Oral 76  20  94 % 6' (1.829 m) 95.165 kg (209 lb 12.8 oz)  05/22/11 0115 150/97 mmHg 98.4 F (36.9 C) - 74  - 100 % - -  05/22/11 0100 141/91 mmHg - - 79  - 100 % - -  05/22/11 0045 144/99 mmHg 98.5 F (36.9 C) - 84  - 96 % - -  05/21/11 2329 146/94 mmHg 98.5 F (36.9 C) - 77  21  98 % - -  05/21/11 2049 161/101 mmHg 98.7 F (37.1 C) Oral 81  18  96 % - -    Physical Exam: General:  No acute distress, awake Cardiovascular:    [x]   S1/S2 present, RRR  []   Irregularly irregular Chest:  CTA-B Abdomen:               []  Soft, appropriately TTP  [x]  Soft, NTTP  []  Soft, appropriately TTP, incision(s) clean/dry/intact  Genitourinary: Foley in place Foley:  Draining yellow urine    I/O last 3 completed shifts: In: 1000 [I.V.:1000] Out: 1025 [Urine:1025]  Recent Labs  [facility] 05/22/11 0516 05/21/11 2201   HGB 13.9 14.4   WBC 9.3 10.9*   PLT 148* 152    Recent Labs  [facility] 05/22/11 0516 05/21/11 2201   NA 137 134*   K 4.6 5.3*   CL 104 104   CO2 23 19   BUN 43* 47*   CREATININE 3.37* 3.82*   CALCIUM 8.9 8.9   GFRNONAA 17* 15*   GFRAA 20* 17*     Recent Labs  Basename 05/21/11  2201   INR 1.08   APTT 35     No components found with this basename: ABG:2    Length of stay: 1 days.  Assessment: ARF. Left ureter stone. POD#1 left ureter stent placement. Chest pain.  Plan: -Recheck cardiac enzymes now; I feel that his discomfort is not likely cardiac in nature given his normal CXR/cardiac enzymes/no change in EKG from work up last night. -Continue IV hydration and foley catheter to maximize drainage. -Will convert patient to an inpatient admission to further follow his ARF.   Natalia Leatherwood, MD [phone]   ADDENDUM: Repeat cardiac enzymes show Trop I to be undetectable; CK-MB is mildly elevated at 5.1 and CK total is normal at 138. I discussed with the on call cardiologist, Dr. Sharyn Lull, who recommended outpatient work up. He informed me his Trop I would have been elevated by now if he was having a cardiac event and cardiac enzymes could be safely discontinued.  Patient/family notified.

## 2011-05-22 NOTE — Op Note (Signed)
NAMEGATES, JIVIDEN NO.:  [id number]  MEDICAL RECORD NO.:  [id number]  LOCATION:  1435                         FACILITY:  [facility]  PHYSICIAN:  Natalia Leatherwood, MD    DATE OF BIRTH:  Oct 15, 1940  DATE OF PROCEDURE:  05/21/2011 DATE OF DISCHARGE:                              OPERATIVE REPORT   PROCEDURES:  Cystoscopy, left retrograde pyelogram, and left ureteral stent placement, Fluoroscopy with interpretation less than one hour.  FINDINGS:  Impacted left proximal ureter stone, not visible on fluoroscopy.  No extravasation of contrast material.  COMPLICATIONS:  None.  ESTIMATED BLOOD LOSS:  None.  DRAINS:  Foley catheter.  SPECIMEN:  None.  HISTORY OF PRESENT ILLNESS:  This 71 year old gentleman presents to the ER with flank pain.  He has known history of nephrolithiasis who has been treated by Dr. Retta Diones last year with a PCNL.  He was found to have a left proximal obstructing ureteral stone.  After discussing the risks and benefits of various management options, he elected to have a ureteral stent placed.  He is also noted to be in acute renal failure.  DESCRIPTION OF PROCEDURE:  After informed consent was obtained, the patient was taken to the operating room, where he was placed in supine position.  IV antibiotics were infused and general anesthesia was induced.  He was then placed in dorsal lithotomy position making sure to pad all pertinent neurovascular pressure points appropriately.  Time-out was performed in which the correct patient, surgical site, and procedure were identified and agreed upon by the team.  His genitals were prepped and draped in usual sterile fashion.  A rigid cystoscope was advanced per urethra into the bladder.  The bladder was seen to have a small bladder neck contracture, but the scope easily passed through this area. The left ureteral orifice was identified.  It was cannulated with a sensor-tip wire.  This did go up with  some tension around the area where the stone would have been located and the stone was not visible on fluoroscopy.  Next, a 6 x 28 double-J ureteral stent was attempted to be placed; however, this was unsuccessful.  Therefore, it was removed and a retrograde pyelogram was obtained by cannulating the distal left ureteral orifice with 6-French ureteral catheter, and I injected contrast.  There was no extravasation and showed the stone was then positioned in the proximal ureter, and the wire was in correct position in the renal pelvis.  Next, a 4.8 x 28 double-J ureteral stent was placed up over the wire (without strings in place) this time with ease into the collecting system.  A good curl was noted on fluoroscopy in the left renal pelvis and in the bladder.  The scope was then removed and 10 cc of lidocaine jelly were placed into the urethra, and then, an 18-French catheter was placed into the bladder with 10 cc of sterile water placed in the balloon.  A belladonna and opioid suppository were placed into the patient's rectum and this completed the procedure.  He was placed back in supine position.  Anesthesia was reversed.  He was taken to the PACU in a stable condition.  He will  be admitted overnight for observation, for evaluation of his renal failure.          ______________________________ Natalia Leatherwood, MD     DW/MEDQ  D:  05/22/2011  T:  05/22/2011  Job:  [id number]

## 2011-05-22 NOTE — Progress Notes (Signed)
Pt to room 1435 pt  Walked to the bathroom, feels like that he needs to have BM. Pt states that he just wants to sit in the bathroom, unable to get VS at present due to pt being on commode. Dahlia Client, RN states that she will get the VS when he gets up.

## 2011-05-22 NOTE — Progress Notes (Signed)
Pt requested prn xanax, benadryl, and "spray" for nasal stuffiness. Stated felt "anxious" and uses xanax bid prn at home.  Verified dosage of 0.5mg  with wife.  Dr. Isabel Caprice contacted and orders obtained for prn xanax, benadry, and saline nasal spray per pt request.

## 2011-05-22 NOTE — Progress Notes (Signed)
Pt aware of card. Enzyme result.  Only one c/o chest discomfort mid a.m. Ntd.,. Discomfort described as "tight, didn't last long".  Discomfort relieved with HOB elevated. No further complaints or concerns thus far.

## 2011-05-22 NOTE — Anesthesia Postprocedure Evaluation (Signed)
  Anesthesia Post-op Note  Patient: [name]  Procedure(s) Performed: Procedure(s) (LRB): CYSTOSCOPY WITH STENT PLACEMENT (Left)  Patient Location: PACU  Anesthesia Type: General  Level of Consciousness: awake, alert , oriented, patient cooperative and responds to stimulation  Airway and Oxygen Therapy: Patient Spontanous Breathing and Patient connected to nasal cannula oxygen  Post-op Pain: none  Post-op Assessment: Post-op Vital signs reviewed, Patient's Cardiovascular Status Stable, Respiratory Function Stable, Patent Airway, No signs of Nausea or vomiting and Pain level controlled  Post-op Vital Signs: Reviewed and stable  Complications: No apparent anesthesia complications

## 2011-05-22 NOTE — Transfer of Care (Signed)
Immediate Anesthesia Transfer of Care Note  Patient: [name]  Procedure(s) Performed: Procedure(s) (LRB): CYSTOSCOPY WITH STENT PLACEMENT (Left)  Patient Location: PACU  Anesthesia Type: General  Level of Consciousness: awake, sedated and patient cooperative  Airway & Oxygen Therapy: Patient Spontanous Breathing and Patient connected to face mask oxygen  Post-op Assessment: Report given to PACU RN, Post -op Vital signs reviewed and stable and Patient moving all extremities  Post vital signs: Reviewed and stable  Complications: No apparent anesthesia complications

## 2011-05-23 LAB — BASIC METABOLIC PANEL
BUN: 32 mg/dL — ABNORMAL HIGH (ref 6–23)
Chloride: 108 mEq/L (ref 96–112)
Creatinine, Ser: 2.38 mg/dL — ABNORMAL HIGH (ref 0.50–1.35)
GFR calc Af Amer: 30 mL/min — ABNORMAL LOW (ref >90)

## 2011-05-23 MED ORDER — BELLADONNA ALKALOIDS-OPIUM 16.2-60 MG RE SUPP
1.0000 | Freq: Four times a day (QID) | RECTAL | Status: DC | PRN
  Administered 2011-05-23: 1 via RECTAL
  Filled 2011-05-23: qty 1

## 2011-05-23 MED ORDER — HYDROCODONE-ACETAMINOPHEN 5-500 MG PO CAPS: 1.0000 | ORAL_CAPSULE | ORAL | Status: AC | PRN

## 2011-05-23 MED ORDER — TAMSULOSIN HCL 0.4 MG PO CAPS
0.4000 mg | ORAL_CAPSULE | Freq: Every day | ORAL | Status: DC
  Administered 2011-05-23: 0.4 mg via ORAL
  Filled 2011-05-23 (×2): qty 1

## 2011-05-23 NOTE — ED Provider Notes (Signed)
I saw and evaluated the patient, reviewed the resident's note and I agree with the findings and plan. Pt with left flank, low back pain, ureteral stone on ct. abd soft nt.   Suzi Roots, MD 05/23/11 [phone]

## 2011-05-23 NOTE — Progress Notes (Signed)
1030 - Pt c/o anxiety and sweating, stated this happened when his BP was elevated before.  BP 200/115, HR 75.  Pt lying in bed after returning from bathroom; denies any other symptoms or chest pain.  Scheduled AM meds given including Atenolol.  1115 - Rechecked BP, 171/99.  HR 73.  Pt lying comfortably in bed and denies discomfort or pain.  Pt informed RN that he is supposed to be taking Norvasc as well, but waited too long to get his prescription but it is now filled and waiting for him to pick it up at his pharmacy.    1120 - Called and spoke with Dr. Retta Diones, notified of the above and that pt has voided several times this AM post cath D/C.  MD will write discharge orders and RN to instruct pt to obtain and take his home medications as instructed.  Will continue to monitor.  Ardyth Gal, RN 05/23/2011

## 2011-05-23 NOTE — Progress Notes (Signed)
2 Days Post-Op Subjective: Patient reports some bladder spasms from catheter  Objective: Vital signs in last 24 hours: Temp:  [97.6 F (36.4 C)-99.2 F (37.3 C)] 99.2 F (37.3 C) (05/20 0526) Pulse Rate:  [68-84] 81  (05/20 0526) Resp:  [19-20] 20  (05/20 0526) BP: (160-197)/(82-108) 160/82 mmHg (05/20 0526) SpO2:  [94 %-97 %] 94 % (05/20 0526)  Intake/Output from previous day: 05/19 0701 - 05/20 0700 In: 2685 [P.O.:560; I.V.:1875; IV Piggyback:250] Out: 4100 [Urine:4100] Intake/Output this shift: Total I/O In: 950 [P.O.:200; I.V.:750] Out: 2000 [Urine:2000]  Physical Exam:  Constitutional: Vital signs reviewed. WD WN in NAD     Lab Results:  Basename 05/22/11 0516 05/21/11 2201  HGB 13.9 14.4  HCT 40.4 40.6   BMET  Basename 05/23/11 0504 05/22/11 0516  NA 139 137  K 4.6 4.6  CL 108 104  CO2 21 23  GLUCOSE 91 101*  BUN 32* 43*  CREATININE 2.38* 3.37*  CALCIUM 8.8 8.9    Basename 05/21/11 2201  LABPT --  INR 1.08   No results found for this basename: LABURIN:1 in the last 72 hours Results for orders placed during the hospital encounter of 01/13/10  SURGICAL PCR SCREEN     Status: Normal   Collection Time   01/08/10 11:40 AM      Component Value Range Status Comment   MRSA, PCR NEGATIVE  NEGATIVE  Final    Staphylococcus aureus    NEGATIVE  Final    Value: NEGATIVE            The Xpert SA Assay (FDA     approved for NASAL specimens     only), is one component of     a comprehensive surveillance     program.  It is not intended     to diagnose infection nor to     guide or monitor treatment.    Studies/Results: Dg Chest Portable 1 View  05/21/2011  *RADIOLOGY REPORT*  Clinical Data: Flank pain.  PORTABLE CHEST - 1 VIEW  Comparison: 01/08/2010  Findings: 2138 hours.  Lung volumes are low.  Right lung is clear. There is some atelectasis or infiltrate at the left base. Cardiopericardial silhouette is accentuated by AP technique and low volume film.  Imaged bony structures of the thorax are intact.  IMPRESSION: Low volume film with left base atelectasis or infiltrate.  Original Report Authenticated By: ERIC A. MANSELL, M.D.    Assessment/Plan:   1. Proximal ureteral calculus. Status post stenting. He is comfortable    2. Acute/chronic renal insufficiency. Creatinine is down to 2.38 today.    I will remove his Foley catheter and saline lock his IV    From the standpoint of his renal function, he is improved. I feel that if he voids, he is okay to go home from my standpoint. We will manage his proximal ureteral stone as an outpatient.   LOS: 2 days   Marcine Matar M 05/23/2011, 6:36 AM

## 2011-05-23 NOTE — Progress Notes (Signed)
Pt's BP elevated at 197/105 (second reading).  Pt assessed and said that he felt less anxious and had no pain at the time.  Pt lying in bed at time, appeared to be sleeping.  Notified MD on call, Dr. Isabel Caprice.   No new orders.  Dr. Isabel Caprice mentioned that hospitalist could decide Monday a.m. Whether or not to treat pt's high BP d/t cardiac history.  Will continue to assess pt.

## 2011-06-09 ENCOUNTER — Other Ambulatory Visit: Payer: Self-pay | Admitting: Urology

## 2011-06-09 ENCOUNTER — Encounter (HOSPITAL_BASED_OUTPATIENT_CLINIC_OR_DEPARTMENT_OTHER): Payer: Self-pay | Admitting: *Deleted

## 2011-06-10 ENCOUNTER — Encounter (HOSPITAL_BASED_OUTPATIENT_CLINIC_OR_DEPARTMENT_OTHER): Payer: Self-pay | Admitting: *Deleted

## 2011-06-10 NOTE — Progress Notes (Signed)
NPO AFTER MN. ARRIVES AT 0845. NEED ISTAT. CURRENT EKG IN EPIC AND CHART. WILL TAKE ATENOLOL, NORVASC AM OF SURG. W/ SIP OF WATER AND IF NEEDED HYDROCODONE OR XANAX. (NOT BOTH TOGETHER)

## 2011-06-14 NOTE — H&P (Signed)
Urology History and Physical Exam  CC: Kidney stones  HPI: 71 year old male presents for management of renal calculi. His history is as follows:    BPH  He underwent TURP in December/2004. Pathology was benign. His voiding has been adequate since that time  UROLITHIASIS  He has uric acid urolithiasis. This would be easily treated/prevented, but the patient has been noncompliant with urinary alkalinization. His intervention history is below:  02/2003-cystoscopy, double-J stent placement followed by lithotripsy of a large right staghorn calculus. He also had ureteroscopy and laser lithotripsy of left ureteral/renal calculi 11/06/2005-cystoscopy, bilateral double-J stent placement 11/07/2005-right percutaneous nephrolithotomy of a large staghorn calculus 11/10/2005-cystoscopy, left ureteroscopic stone extraction 12/10/2007-percutaneous nephrolithotomy of aright UPJ and right lower pole calculi 01/13/2010-percutaneous nephrolithotomy of a 10 cm left renal calculus 05/21/2011-cystoscopy, double-J stent placement for a left 8 mm upper ureteral stone  He presents now for cystoscopy, bilateral ureteroscopy and holmium laser of left ureteral and right renal calculi.   PMH: Past Medical History  Diagnosis Date  . Herniated disc   . Hypertension   . Renal calculi BILATERAL   . Chronic back pain   . History of TIA (transient ischemic attack) 2010--  NO RESIDUAL    NONE SINCE  . Dyslipidemia   . Bilateral flank pain     DUE TO KIDNEY STONES  . Weakness of both legs   . Eczema   . History of kidney stones   . Arthritis   . Frequency of urination   . Urgency of urination   . Nocturia   . Hematuria   . Anxiety   . Glaucoma PT STATES RX RAN OUT    PSH: Past Surgical History  Procedure Date  . Percutaneous nephrolithotomy 12-10-2007  &  11-07-2005    RIGHT RENAL CALCULI  . Cysto/ bilateral retrograde pyelogram/ left ureterscopic stone extraction 11-14-2005    LEFT URETERAL CALCULUS   . Cysto/ bilateral ureteral stents 11-06-2005  . Right ureteroscopic stone extraction 05-29-2003  . Right ureteral stent placement 02-10-2003  . Repair left inguinal hernia w/ mesh 12-23-2002  . Internal urethrotomy/ turp 12-23-2002    BPH W/ STRICTURE  . Cysto/ left retrograde pyelogram/  left stent placment 05-21-2011  . Extracorporeal shock wave lithotripsy   . Transthoracic echocardiogram 12-19-2008    LVSF NORMAL/ EF 55-60%/ GRADE I DIASTOLIC DYSFUNCTION/     Allergies: Allergies  Allergen Reactions  . Sulfa Antibiotics Hives    Medications: No prescriptions prior to admission     Social History: History   Social History  . Marital Status: Married    Spouse Name: N/A    Number of Children: N/A  . Years of Education: N/A   Occupational History  . Not on file.   Social History Main Topics  . Smoking status: Former Smoker -- 40 years    Types: Cigarettes    Quit date: 06/10/1990  . Smokeless tobacco: Never Used  . Alcohol Use: No  . Drug Use: No  . Sexually Active: Yes    Birth Control/ Protection: None   Other Topics Concern  . Not on file   Social History Narrative  . No narrative on file    Family History: No family history on file.  Review of Systems: Positive: Recent flank pain, hematuria. He denies frequency and urgency secondary to stent placement. Negative: .  A further 10 point review of systems was negative except what is listed in the HPI.  Physical Exam: @VITALS2 @ General: No acute distress.  Awake. Head:  Normocephalic.  Atraumatic. ENT:  EOMI.  Mucous membranes moist Neck:  Supple.  No lymphadenopathy. CV:  S1 present. S2 present. Regular rate. Pulmonary: Equal effort bilaterally.  Clear to auscultation bilaterally. Abdomen: Soft.  Non tender to palpation. Skin:  Normal turgor.  No visible rash. Extremity: No gross deformity of bilateral upper extremities.  No gross deformity of    bilateral lower extremities. Neurologic: Alert.  Appropriate mood.  Penis:    No lesions. Urethra: Orthotopic meatus. Scrotum: No lesions.  No ecchymosis.  No erythema. Testicles: Descended bilaterally.  No masses bilaterally. Epididymis: Palpable bilaterally.  Non Tender to palpation.  Studies:  No results found for this basename: HGB:2,WBC:2,PLT:2 in the last 72 hours  No results found for this basename: NA:2,K:2,CL:2,CO2:2,BUN:2,CREATININE:2,CALCIUM:2,MAGNESIUM:2,GFRNONAA:2,GFRAA:2 in the last 72 hours   No results found for this basename: PT:2,INR:2,APTT:2 in the last 72 hours   No components found with this basename: ABG:2    Assessment:  1. Left upper ureteral stone, status post stenting.  2. Right renal calculi  Plan: The patient will undergo cystoscopy, removal of left ureteral stent, left ureteroscopy with holmium laser and extraction of left ureteral stone, right ureteroscopy with holmium laser of right renal calculi, with more than likely bilateral double-J stent placed afterwards. He understands the procedure, as well as risks and complications. He desires to proceed.

## 2011-06-15 ENCOUNTER — Ambulatory Visit: Admit: 2011-06-15 | Payer: Self-pay | Admitting: Urology

## 2011-06-15 ENCOUNTER — Encounter (HOSPITAL_BASED_OUTPATIENT_CLINIC_OR_DEPARTMENT_OTHER): Payer: Self-pay | Admitting: Anesthesiology

## 2011-06-15 ENCOUNTER — Encounter (HOSPITAL_BASED_OUTPATIENT_CLINIC_OR_DEPARTMENT_OTHER): Payer: Self-pay | Admitting: *Deleted

## 2011-06-15 ENCOUNTER — Ambulatory Visit (HOSPITAL_BASED_OUTPATIENT_CLINIC_OR_DEPARTMENT_OTHER): Payer: Medicare Other | Admitting: Anesthesiology

## 2011-06-15 ENCOUNTER — Ambulatory Visit (HOSPITAL_BASED_OUTPATIENT_CLINIC_OR_DEPARTMENT_OTHER)
Admission: RE | Admit: 2011-06-15 | Discharge: 2011-06-15 | Disposition: A | Discharge status: Home / Self Care | Payer: Medicare Other | Source: Ambulatory Visit | Attending: Urology | Admitting: Urology

## 2011-06-15 ENCOUNTER — Encounter (HOSPITAL_BASED_OUTPATIENT_CLINIC_OR_DEPARTMENT_OTHER)
Admission: RE | Disposition: A | Discharge status: Home / Self Care | Payer: Self-pay | Source: Ambulatory Visit | Attending: Urology

## 2011-06-15 DIAGNOSIS — Z8673 Personal history of transient ischemic attack (TIA), and cerebral infarction without residual deficits: Secondary | ICD-10-CM | POA: Insufficient documentation

## 2011-06-15 DIAGNOSIS — I10 Essential (primary) hypertension: Secondary | ICD-10-CM | POA: Insufficient documentation

## 2011-06-15 DIAGNOSIS — E785 Hyperlipidemia, unspecified: Secondary | ICD-10-CM | POA: Insufficient documentation

## 2011-06-15 DIAGNOSIS — H409 Unspecified glaucoma: Secondary | ICD-10-CM | POA: Insufficient documentation

## 2011-06-15 DIAGNOSIS — N201 Calculus of ureter: Secondary | ICD-10-CM | POA: Insufficient documentation

## 2011-06-15 DIAGNOSIS — N200 Calculus of kidney: Secondary | ICD-10-CM | POA: Insufficient documentation

## 2011-06-15 HISTORY — DX: Unspecified glaucoma: H40.9

## 2011-06-15 HISTORY — DX: Hematuria, unspecified: R31.9

## 2011-06-15 HISTORY — DX: Calculus of kidney: N20.0

## 2011-06-15 HISTORY — DX: Personal history of urinary calculi: Z87.442

## 2011-06-15 HISTORY — DX: Urgency of urination: R39.15

## 2011-06-15 HISTORY — DX: Dorsalgia, unspecified: M54.9

## 2011-06-15 HISTORY — DX: Other chronic pain: G89.29

## 2011-06-15 HISTORY — PX: CYSTOSCOPY W/ URETERAL STENT REMOVAL: SHX1430

## 2011-06-15 HISTORY — DX: Other symptoms and signs involving the musculoskeletal system: R29.898

## 2011-06-15 HISTORY — DX: Dermatitis, unspecified: L30.9

## 2011-06-15 HISTORY — DX: Frequency of micturition: R35.0

## 2011-06-15 HISTORY — DX: Unspecified osteoarthritis, unspecified site: M19.90

## 2011-06-15 HISTORY — DX: Nocturia: R35.1

## 2011-06-15 HISTORY — DX: Personal history of transient ischemic attack (TIA), and cerebral infarction without residual deficits: Z86.73

## 2011-06-15 HISTORY — PX: URETEROSCOPY: SHX842

## 2011-06-15 HISTORY — DX: Unspecified abdominal pain: R10.9

## 2011-06-15 HISTORY — PX: BILIARY STENT PLACEMENT: SHX5538

## 2011-06-15 HISTORY — DX: Hyperlipidemia, unspecified: E78.5

## 2011-06-15 HISTORY — DX: Anxiety disorder, unspecified: F41.9

## 2011-06-15 LAB — POCT I-STAT 4, (NA,K, GLUC, HGB,HCT)
Hemoglobin: 15.6 g/dL (ref 13.0–17.0)
Potassium: 4.1 mEq/L (ref 3.5–5.1)

## 2011-06-15 SURGERY — REMOVAL, STENT, URETER, CYSTOSCOPIC
Anesthesia: General | Laterality: Left

## 2011-06-15 SURGERY — REMOVAL, STENT, URETER, CYSTOSCOPIC
Anesthesia: General | Site: Ureter | Laterality: Left | Wound class: Clean Contaminated

## 2011-06-15 MED ORDER — ONDANSETRON HCL 4 MG/2ML IJ SOLN
INTRAMUSCULAR | Status: DC | PRN
  Administered 2011-06-15: 4 mg via INTRAVENOUS

## 2011-06-15 MED ORDER — EPHEDRINE SULFATE 50 MG/ML IJ SOLN
INTRAMUSCULAR | Status: DC | PRN
  Administered 2011-06-15: 10 mg via INTRAVENOUS

## 2011-06-15 MED ORDER — FENTANYL CITRATE 0.05 MG/ML IJ SOLN
INTRAMUSCULAR | Status: DC | PRN
  Administered 2011-06-15: 25 ug via INTRAVENOUS
  Administered 2011-06-15 (×2): 50 ug via INTRAVENOUS
  Administered 2011-06-15 (×3): 25 ug via INTRAVENOUS
  Administered 2011-06-15 (×2): 50 ug via INTRAVENOUS

## 2011-06-15 MED ORDER — DEXAMETHASONE SODIUM PHOSPHATE 4 MG/ML IJ SOLN
INTRAMUSCULAR | Status: DC | PRN
  Administered 2011-06-15: 4 mg via INTRAVENOUS

## 2011-06-15 MED ORDER — CIPROFLOXACIN IN D5W 400 MG/200ML IV SOLN
400.0000 mg | INTRAVENOUS | Status: AC
  Administered 2011-06-15: 400 mg via INTRAVENOUS

## 2011-06-15 MED ORDER — IOHEXOL 350 MG/ML SOLN
INTRAVENOUS | Status: DC | PRN
  Administered 2011-06-15: 50 mL

## 2011-06-15 MED ORDER — PROPOFOL 10 MG/ML IV EMUL
INTRAVENOUS | Status: DC | PRN
  Administered 2011-06-15: 200 mg via INTRAVENOUS

## 2011-06-15 MED ORDER — CIPROFLOXACIN HCL 250 MG PO TABS: 250.0000 mg | ORAL_TABLET | Freq: Two times a day (BID) | ORAL | Status: DC

## 2011-06-15 MED ORDER — LACTATED RINGERS IV SOLN
INTRAVENOUS | Status: DC
  Administered 2011-06-15: 10:00:00 via INTRAVENOUS

## 2011-06-15 MED ORDER — HYDROMORPHONE HCL PF 1 MG/ML IJ SOLN: 0.2500 mg | INTRAMUSCULAR | Status: DC | PRN

## 2011-06-15 MED ORDER — HYDROCODONE-ACETAMINOPHEN 5-500 MG PO TABS: 1.0000 | ORAL_TABLET | Freq: Four times a day (QID) | ORAL | Status: DC | PRN

## 2011-06-15 MED ORDER — LACTATED RINGERS IV SOLN
INTRAVENOUS | Status: DC | PRN
  Administered 2011-06-15 (×2): via INTRAVENOUS

## 2011-06-15 MED ORDER — PROMETHAZINE HCL 25 MG/ML IJ SOLN: 6.2500 mg | INTRAMUSCULAR | Status: DC | PRN

## 2011-06-15 MED ORDER — LIDOCAINE HCL (CARDIAC) 20 MG/ML IV SOLN
INTRAVENOUS | Status: DC | PRN
  Administered 2011-06-15: 75 mg via INTRAVENOUS

## 2011-06-15 MED ORDER — POTASSIUM CITRATE ER 10 MEQ (1080 MG) PO TBCR: 10.0000 meq | EXTENDED_RELEASE_TABLET | Freq: Three times a day (TID) | ORAL | Status: DC

## 2011-06-15 MED ORDER — SODIUM CHLORIDE 0.9 % IR SOLN
Status: DC | PRN
  Administered 2011-06-15: 6000 mL

## 2011-06-15 SURGICAL SUPPLY — 44 items
ADAPTER CATH URET PLST 4-6FR (CATHETERS) IMPLANT
ADPR CATH URET STRL DISP 4-6FR (CATHETERS)
BAG DRAIN URO-CYSTO SKYTR STRL (DRAIN) ×3 IMPLANT
BAG DRN UROCATH (DRAIN) ×2
BASKET LASER NITINOL 1.9FR (BASKET) IMPLANT
BASKET STNLS GEMINI 4WIRE 3FR (BASKET) IMPLANT
BASKET ZERO TIP NITINOL 2.4FR (BASKET) ×1 IMPLANT
BRUSH URET BIOPSY 3F (UROLOGICAL SUPPLIES) IMPLANT
BSKT STON RTRVL 120 1.9FR (BASKET)
BSKT STON RTRVL GEM 120X11 3FR (BASKET)
BSKT STON RTRVL ZERO TP 2.4FR (BASKET) ×2
CANISTER SUCT LVC 12 LTR MEDI- (MISCELLANEOUS) ×1 IMPLANT
CATH FOLEY 2WAY SLVR  5CC 20FR (CATHETERS) ×1
CATH FOLEY 2WAY SLVR 5CC 20FR (CATHETERS) IMPLANT
CATH INTERMIT  6FR 70CM (CATHETERS) ×3 IMPLANT
CATH URET 5FR 28IN CONE TIP (BALLOONS)
CATH URET 5FR 28IN OPEN ENDED (CATHETERS) IMPLANT
CATH URET 5FR 70CM CONE TIP (BALLOONS) IMPLANT
CLOTH BEACON ORANGE TIMEOUT ST (SAFETY) ×3 IMPLANT
DRAPE CAMERA CLOSED 9X96 (DRAPES) ×3 IMPLANT
ELECT REM PT RETURN 9FT ADLT (ELECTROSURGICAL)
ELECTRODE REM PT RTRN 9FT ADLT (ELECTROSURGICAL) IMPLANT
GLOVE BIO SURGEON STRL SZ7 (GLOVE) ×1 IMPLANT
GLOVE BIO SURGEON STRL SZ8 (GLOVE) ×3 IMPLANT
GLOVE INDICATOR 7.0 STRL GRN (GLOVE) ×1 IMPLANT
GLOVE SURG SS PI 7.0 STRL IVOR (GLOVE) ×1 IMPLANT
GOWN PREVENTION PLUS LG XLONG (DISPOSABLE) ×2 IMPLANT
GOWN STRL REIN XL XLG (GOWN DISPOSABLE) ×3 IMPLANT
GOWN SURGICAL LARGE (GOWNS) ×2 IMPLANT
GUIDEWIRE 0.038 PTFE COATED (WIRE) IMPLANT
GUIDEWIRE ANG ZIPWIRE 038X150 (WIRE) IMPLANT
GUIDEWIRE STR DUAL SENSOR (WIRE) ×1 IMPLANT
IV NS IRRIG 3000ML ARTHROMATIC (IV SOLUTION) ×6 IMPLANT
KIT BALLIN UROMAX 15FX10 (LABEL) IMPLANT
KIT BALLN UROMAX 15FX4 (MISCELLANEOUS) IMPLANT
KIT BALLN UROMAX 26 75X4 (MISCELLANEOUS)
LASER FIBER DISP (UROLOGICAL SUPPLIES) ×2 IMPLANT
PACK CYSTOSCOPY (CUSTOM PROCEDURE TRAY) ×3 IMPLANT
SET HIGH PRES BAL DIL (LABEL)
SHEATH ACCESS URETERAL 54CM (SHEATH) ×1 IMPLANT
SHEATH URET ACCESS 12FR/35CM (UROLOGICAL SUPPLIES) IMPLANT
SHEATH URET ACCESS 12FR/55CM (UROLOGICAL SUPPLIES) IMPLANT
STENT URET 6FRX24 CONTOUR (STENTS) ×2 IMPLANT
SYRINGE 10CC LL (SYRINGE) ×1 IMPLANT

## 2011-06-15 NOTE — Interval H&P Note (Signed)
History and Physical Interval Note:  06/15/2011 10:03 AM  [name]  has presented today for surgery, with the diagnosis of BILATERAL RENAL CALCULI  The various methods of treatment have been discussed with the patient and family. After consideration of risks, benefits and other options for treatment, the patient has consented to  Procedure(s) (LRB): CYSTOSCOPY WITH STENT REMOVAL (Left) URETEROSCOPY (Bilateral) HOLMIUM LASER APPLICATION (Bilateral) as a surgical intervention .  The patients' history has been reviewed, patient examined, no change in status, stable for surgery.  I have reviewed the patients' chart and labs.  Questions were answered to the patient's satisfaction.     [name]

## 2011-06-15 NOTE — Anesthesia Preprocedure Evaluation (Signed)
Anesthesia Evaluation  Patient identified by MRN, date of birth, ID band Patient awake    Reviewed: Allergy & Precautions, H&P , NPO status , Patient's Chart, lab work & pertinent test results  Airway Mallampati: II TM Distance: >3 FB Neck ROM: Full    Dental No notable dental hx.    Pulmonary neg pulmonary ROS,  breath sounds clear to auscultation  Pulmonary exam normal       Cardiovascular hypertension, Pt. on medications and Pt. on home beta blockers Rhythm:Regular Rate:Normal     Neuro/Psych Anxiety negative neurological ROS     GI/Hepatic negative GI ROS, Neg liver ROS,   Endo/Other  negative endocrine ROS  Renal/GU Creatinine has been elevated last month. Now Cr 2.38  negative genitourinary   Musculoskeletal negative musculoskeletal ROS (+)   Abdominal   Peds negative pediatric ROS (+)  Hematology negative hematology ROS (+)   Anesthesia Other Findings   Reproductive/Obstetrics negative OB ROS                           Anesthesia Physical Anesthesia Plan  ASA: II  Anesthesia Plan: General   Post-op Pain Management:    Induction: Intravenous  Airway Management Planned: LMA  Additional Equipment:   Intra-op Plan:   Post-operative Plan: Extubation in OR  Informed Consent: I have reviewed the patients History and Physical, chart, labs and discussed the procedure including the risks, benefits and alternatives for the proposed anesthesia with the patient or authorized representative who has indicated his/her understanding and acceptance.   Dental advisory given  Plan Discussed with: CRNA  Anesthesia Plan Comments:         Anesthesia Quick Evaluation

## 2011-06-15 NOTE — Transfer of Care (Signed)
Immediate Anesthesia Transfer of Care Note  Patient: [name]  Procedure(s) Performed: Procedure(s) (LRB): CYSTOSCOPY WITH STENT REMOVAL (Left) URETEROSCOPY (Bilateral) HOLMIUM LASER APPLICATION (Bilateral) BILIARY STENT PLACEMENT (Bilateral)  Patient Location: PACU  Anesthesia Type: General  Level of Consciousness: awake, oriented, sedated and patient cooperative  Airway & Oxygen Therapy: Patient Spontanous Breathing and Patient connected to face mask oxygen  Post-op Assessment: Report given to PACU RN and Post -op Vital signs reviewed and stable  Post vital signs: Reviewed and stable  Complications: No apparent anesthesia complications

## 2011-06-15 NOTE — Anesthesia Procedure Notes (Signed)
Procedure Name: LMA Insertion Date/Time: 06/15/2011 10:11 AM Performed by: Jessica Priest Pre-anesthesia Checklist: Patient identified, Emergency Drugs available, Suction available and Patient being monitored Patient Re-evaluated:Patient Re-evaluated prior to inductionOxygen Delivery Method: Circle System Utilized Preoxygenation: Pre-oxygenation with 100% oxygen Intubation Type: IV induction Ventilation: Mask ventilation without difficulty LMA: LMA with gastric port inserted LMA Size: 5.0 Number of attempts: 1 Airway Equipment and Method: bite block Placement Confirmation: positive ETCO2 Tube secured with: Tape Dental Injury: Teeth and Oropharynx as per pre-operative assessment

## 2011-06-15 NOTE — Anesthesia Postprocedure Evaluation (Signed)
  Anesthesia Post-op Note  Patient: [name]  Procedure(s) Performed: Procedure(s) (LRB): CYSTOSCOPY WITH STENT REMOVAL (Left) URETEROSCOPY (Bilateral) HOLMIUM LASER APPLICATION (Bilateral) BILIARY STENT PLACEMENT (Bilateral)  Patient Location: PACU  Anesthesia Type: General  Level of Consciousness: awake and alert   Airway and Oxygen Therapy: Patient Spontanous Breathing  Post-op Pain: mild  Post-op Assessment: Post-op Vital signs reviewed, Patient's Cardiovascular Status Stable, Respiratory Function Stable, Patent Airway and No signs of Nausea or vomiting  Post-op Vital Signs: stable  Complications: No apparent anesthesia complications

## 2011-06-15 NOTE — Op Note (Signed)
Preoperative diagnosis: Left proximal ureteral stone, right renal calculi  Postoperative diagnosis: Same  Procedure: Cystoscopy, left double-J stent extraction, left ureteroscopy (rigid and flexible), holmium laser ablation and extraction of left proximal ureteral stone, right retrograde ureteropyelogram with interpretive fluoroscopy, right ureteroscopy (flexible), holmium laser ablation of 2 right renal calculi, right double-J stent placement  Surgeon: Eura Radabaugh  Anesthesia: Gen. with LMA  Specimens: Left ureteral calculus  Drains: Bilateral ureteral double-J stent, 6 French by 24 cm, left stent with tether, 20 French Foley catheter  Complications: None  Indications: 71 year old noncompliant stone former, with uric acid calculi. He has a left double-J stent, this having been placed in May, 2013 for obstructing, symptomatic left proximal ureteral stone. He was also found to have 2 moderate-sized right renal calculi. He presents at this time for ureteroscopy following stent removal on the left, holmium laser ablation and extraction of left proximal ureteral stone, and ureteroscopic management of right renal calculi. He is aware of risks and complications and desires to proceed.  Description of procedure: The patient was identified in the holding area, and received preoperative IV antibiotics. He was taken to the operating room where general anesthetic was administered with the LMA. He was placed in the dorsolithotomy position. Genitalia and perineum were prepped and draped. Proper timeout was then performed.  I then placed a 22 French panendoscope through his urethra which was normal. 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 The bladder was inspected circumferentially. No tumors or trabeculations were noted. There was an encrusted stent present the left renal orifice. This was grasped and brought out the urethra. Unfortunately, the stent did not remove easily,  despite gentle pressure. I then had to pass a guidewire, and then a rigid ureteroscope up along the stent. This revealed encrustation is throughout. These were easily broken up with the ureteroscope. The stent was then easily removed. The guidewire had been placed possibly in the renal pelvis. I then passed the ureteroscope again up into the ureter on the left, and there were multiple small fragments, sand-like, present within the ureter. These were too small to be grasped. I was unable to advance the short ureteroscope all the way to the previously mentioned stone. I then passed a ureteral access sheath over top of the guidewire, into the proximal ureter. The flexible/digital ureteroscope was then passed up to the obstructing stone which was present in the left proximal ureter. It was impacted. It was fragmented into multiple smaller fragments with the 200  fiber and the holmium laser. It was interesting that once the stone was fragmented, it seemed like there was a ball of the small hairs or fibers within the stone. The stone fragments were all grasped and extracted through the ureteral access sheath. This was a difficult procedure, and took approximately 1 hour to rid the patient the stone burden on the left. Following removal of all the visual fragments in the ureter, I advanced the scope into the renal pelvis and inspected the calyceal system. I did not see any stones within this area. A guidewire was then passed back through the ureteral access sheath, and using a cystoscope, I laced a 24 cm x 6 French contour double-J stent, with the string attached. The string was brought out through the patient's urethra. The bladder was drained. I then advanced the scope back into the patient's bladder. A 6 [place] open-ended catheter was used to perform a retrograde pyelogram.  The retrograde pyelogram showed a normal caliber ureter, and  a small pyelo-calyceal system. There were 2 evident filling defects, each  approximately 1 cm in size, within the renal pelvis.  Following this, a guidewire was advanced through the scope, and the scope removed. I then dilated the ureter with the inner core of a ureteral access sheath, and then placed the outer core as well up to the proximal ureter over the guidewire. The guidewire and inner core were removed, and the flexible ureteroscope was passed into the renal pelvis. 2 stones were identified. Using the 200  fiber, I fragmented the 2 stones into multiple smaller fragments. The fragments were, at the most, approximately 3-4 mm in size. Because of the large number of these fragments, and the fact that they are uric acid, I felt that placing a stent and alkaline is in the patient's urine was the appropriate thing to do. I then placed a double-J stent over a guidewire, following removal of the ureteroscope and placement of a cystoscope in the bladder. I left the stent without string, after adequate positioning. The bladder was drained. Because of the length of the procedure and the patient's obstructive prostate, I left a 20 French Foley catheter to dependent drainage.  The patient was awakened and taken to the PACU in stable condition. He will be followed up in the next 2-3 weeks. He has instructions to remove his Foley catheter and his stent in 2 days.

## 2011-06-15 NOTE — Discharge Instructions (Signed)
POSTOPERATIVE CARE AFTER URETEROSCOPY  Stent management  *Stents are often left in after ureteroscopy and stone treatment. If left in, they often cause urinary frequency, urgency, occasional blood in the urine, as well as flank discomfort with urination. These are all expected issues, and should resolve after the stent is removed. *Often times, a small thread is left on the end of the stent, and brought out through the urethra. If so, this is used to remove the stent, making it unnecessary to look in the bladder with a scope in the office to remove the stent. If a thread is left on, did not pull on it until instructed. Remove catheter and stent on Friday morning  Diet  Once you have adequately recovered from anesthesia, you may gradually advance your diet, as tolerated, to your regular diet.  Activities  You may gradually increase your activities to your normal unrestricted level the day following your procedure.  Medications  You should resume all preoperative medications. If you are on aspirin-like compounds, you should not resume these until the blood clears from your urine. If given an antibiotic by the surgeon, take these until they are completed. You may also be given, if you have a stent, medications to decrease the urinary frequency and urgency. Take the samples of Urocit-K 3 times daily--MANDATORY!!  Pain  After ureteroscopy, there may be some pain on the side of the scope. Take your pain medicine for this. Usually, this pain resolves within a day or 2.  Fever  Please report any fever over 100 to the doctor.   Post Anesthesia Home Care Instructions  Activity: Get plenty of rest for the remainder of the day. A responsible adult should stay with you for 24 hours following the procedure.  For the next 24 hours, DO NOT: -Drive a car -Advertising copywriter -Drink alcoholic beverages -Take any medication unless instructed by your physician -Make any legal decisions or sign  important papers.  Meals: Start with liquid foods such as gelatin or soup. Progress to regular foods as tolerated. Avoid greasy, spicy, heavy foods. If nausea and/or vomiting occur, drink only clear liquids until the nausea and/or vomiting subsides. Call your physician if vomiting continues.  Special Instructions/Symptoms: Your throat may feel dry or sore from the anesthesia or the breathing tube placed in your throat during surgery. If this causes discomfort, gargle with warm salt water. The discomfort should disappear within 24 hours.

## 2011-06-16 ENCOUNTER — Encounter (HOSPITAL_COMMUNITY): Payer: Self-pay

## 2011-06-16 ENCOUNTER — Inpatient Hospital Stay (HOSPITAL_COMMUNITY): Payer: Medicare Other

## 2011-06-16 ENCOUNTER — Inpatient Hospital Stay (HOSPITAL_COMMUNITY)
Admission: EM | Admit: 2011-06-16 | Discharge: 2011-06-18 | DRG: 690 | Disposition: A | Discharge status: Home / Self Care | Payer: Medicare Other | Attending: Urology | Admitting: Urology

## 2011-06-16 DIAGNOSIS — M549 Dorsalgia, unspecified: Secondary | ICD-10-CM | POA: Diagnosis present

## 2011-06-16 DIAGNOSIS — N189 Chronic kidney disease, unspecified: Secondary | ICD-10-CM

## 2011-06-16 DIAGNOSIS — G8929 Other chronic pain: Secondary | ICD-10-CM | POA: Diagnosis present

## 2011-06-16 DIAGNOSIS — N390 Urinary tract infection, site not specified: Principal | ICD-10-CM | POA: Diagnosis present

## 2011-06-16 DIAGNOSIS — N200 Calculus of kidney: Secondary | ICD-10-CM | POA: Diagnosis present

## 2011-06-16 DIAGNOSIS — Z8673 Personal history of transient ischemic attack (TIA), and cerebral infarction without residual deficits: Secondary | ICD-10-CM

## 2011-06-16 DIAGNOSIS — R32 Unspecified urinary incontinence: Secondary | ICD-10-CM

## 2011-06-16 DIAGNOSIS — I10 Essential (primary) hypertension: Secondary | ICD-10-CM | POA: Diagnosis present

## 2011-06-16 DIAGNOSIS — E785 Hyperlipidemia, unspecified: Secondary | ICD-10-CM | POA: Diagnosis present

## 2011-06-16 DIAGNOSIS — N201 Calculus of ureter: Secondary | ICD-10-CM | POA: Diagnosis present

## 2011-06-16 DIAGNOSIS — Z79899 Other long term (current) drug therapy: Secondary | ICD-10-CM

## 2011-06-16 DIAGNOSIS — Z87891 Personal history of nicotine dependence: Secondary | ICD-10-CM

## 2011-06-16 DIAGNOSIS — H409 Unspecified glaucoma: Secondary | ICD-10-CM | POA: Diagnosis present

## 2011-06-16 DIAGNOSIS — F411 Generalized anxiety disorder: Secondary | ICD-10-CM | POA: Diagnosis present

## 2011-06-16 DIAGNOSIS — Z9889 Other specified postprocedural states: Secondary | ICD-10-CM

## 2011-06-16 DIAGNOSIS — Z87442 Personal history of urinary calculi: Secondary | ICD-10-CM

## 2011-06-16 LAB — CBC
HCT: 45.9 % (ref 39.0–52.0)
Hemoglobin: 15.7 g/dL (ref 13.0–17.0)
MCH: 32.7 pg (ref 26.0–34.0)
MCHC: 34.2 g/dL (ref 30.0–36.0)
MCV: 95.6 fL (ref 78.0–100.0)
RBC: 4.8 MIL/uL (ref 4.22–5.81)

## 2011-06-16 LAB — POCT I-STAT, CHEM 8
Calcium, Ion: 1.25 mmol/L (ref 1.12–1.32)
Creatinine, Ser: 2.1 mg/dL — ABNORMAL HIGH (ref 0.50–1.35)
Glucose, Bld: 115 mg/dL — ABNORMAL HIGH (ref 70–99)
HCT: 49 % (ref 39.0–52.0)
Hemoglobin: 16.7 g/dL (ref 13.0–17.0)

## 2011-06-16 LAB — DIFFERENTIAL
Basophils Relative: 0 % (ref 0–1)
Eosinophils Absolute: 0 10*3/uL (ref 0.0–0.7)
Eosinophils Relative: 0 % (ref 0–5)
Lymphs Abs: 1.9 10*3/uL (ref 0.7–4.0)
Monocytes Absolute: 1.7 10*3/uL — ABNORMAL HIGH (ref 0.1–1.0)
Monocytes Relative: 11 % (ref 3–12)

## 2011-06-16 LAB — URINALYSIS, ROUTINE W REFLEX MICROSCOPIC
Bilirubin Urine: NEGATIVE
Glucose, UA: NEGATIVE mg/dL
Ketones, ur: NEGATIVE mg/dL
Nitrite: NEGATIVE
pH: 6 (ref 5.0–8.0)

## 2011-06-16 LAB — URINE MICROSCOPIC-ADD ON

## 2011-06-16 MED ORDER — HYDROMORPHONE HCL PF 1 MG/ML IJ SOLN
1.0000 mg | INTRAMUSCULAR | Status: AC | PRN
  Administered 2011-06-17 (×3): 1 mg via INTRAVENOUS
  Filled 2011-06-16 (×3): qty 1

## 2011-06-16 MED ORDER — HYDROMORPHONE HCL PF 1 MG/ML IJ SOLN
1.0000 mg | Freq: Once | INTRAMUSCULAR | Status: AC
  Administered 2011-06-16: 1 mg via INTRAVENOUS
  Filled 2011-06-16: qty 1

## 2011-06-16 MED ORDER — ONDANSETRON HCL 4 MG/2ML IJ SOLN
4.0000 mg | Freq: Three times a day (TID) | INTRAMUSCULAR | Status: AC | PRN
  Administered 2011-06-17: 4 mg via INTRAVENOUS
  Filled 2011-06-16: qty 2

## 2011-06-16 MED ORDER — DEXTROSE 5 % IV SOLN
1.0000 g | Freq: Once | INTRAVENOUS | Status: AC
  Administered 2011-06-16: 1 g via INTRAVENOUS
  Filled 2011-06-16: qty 10

## 2011-06-16 MED ORDER — ONDANSETRON HCL 4 MG/2ML IJ SOLN
4.0000 mg | Freq: Once | INTRAMUSCULAR | Status: AC
  Administered 2011-06-16: 4 mg via INTRAVENOUS
  Filled 2011-06-16: qty 2

## 2011-06-16 MED ORDER — SODIUM CHLORIDE 0.9 % IV SOLN
INTRAVENOUS | Status: AC
  Administered 2011-06-16: via INTRAVENOUS

## 2011-06-16 NOTE — ED Provider Notes (Signed)
History     CSN: [id number]  Arrival date & time 06/16/11  1816   First MD Initiated Contact with Patient 06/16/11 2028      Chief Complaint  Patient presents with  . Flank Pain    (Consider location/radiation/quality/duration/timing/severity/associated sxs/prior treatment) HPI Comments: Patient here with complaints of left flank which started today after his wife removed his indwelling foley catheter and urethral stent today.  He states that he had stents placed yesterday by Dr. Retta Diones and foley then, he states that the catheter was too uncomfortable so they called Dr. Normajean Baxter who advised the wife that she could remove them.  He states that he intially got about 15 minutes of pain relief but that the flank pain has now returned and he has episodes of urinary incontinence with standing up as well as fever of 101 at home.  He reports that he feels like he is emptying his bladder.    Patient is a 71 y.o. male presenting with flank pain. The history is provided by the patient and the spouse. No language interpreter was used.  Flank Pain This is a new problem. The current episode started today. The problem occurs constantly. The problem has been unchanged. Associated symptoms include chills, a fever and urinary symptoms. Pertinent negatives include no abdominal pain, anorexia, arthralgias, change in bowel habit, chest pain, congestion, coughing, diaphoresis, fatigue, headaches, joint swelling, myalgias, nausea, neck pain, numbness, rash, sore throat, swollen glands, vertigo, visual change, vomiting or weakness. Nothing aggravates the symptoms. He has tried nothing for the symptoms. The treatment provided no relief.    Past Medical History  Diagnosis Date  . Herniated disc   . Hypertension   . Renal calculi BILATERAL   . Chronic back pain   . History of TIA (transient ischemic attack) 2010--  NO RESIDUAL    NONE SINCE  . Dyslipidemia   . Bilateral flank pain     DUE TO KIDNEY STONES    . Weakness of both legs   . Eczema   . History of kidney stones   . Arthritis   . Frequency of urination   . Urgency of urination   . Nocturia   . Hematuria   . Anxiety   . Glaucoma PT STATES RX RAN OUT  . Glaucoma     Past Surgical History  Procedure Date  . Percutaneous nephrolithotomy 12-10-2007  &  11-07-2005    RIGHT RENAL CALCULI  . Cysto/ bilateral retrograde pyelogram/ left ureterscopic stone extraction 11-14-2005    LEFT URETERAL CALCULUS  . Cysto/ bilateral ureteral stents 11-06-2005  . Right ureteroscopic stone extraction 05-29-2003  . Right ureteral stent placement 02-10-2003  . Repair left inguinal hernia w/ mesh 12-23-2002  . Internal urethrotomy/ turp 12-23-2002    BPH W/ STRICTURE  . Cysto/ left retrograde pyelogram/  left stent placment 05-21-2011  . Extracorporeal shock wave lithotripsy   . Transthoracic echocardiogram 12-19-2008    LVSF NORMAL/ EF 55-60%/ GRADE I DIASTOLIC DYSFUNCTION/     No family history on file.  History  Substance Use Topics  . Smoking status: Former Smoker -- 40 years    Types: Cigarettes    Quit date: 06/10/1990  . Smokeless tobacco: Never Used  . Alcohol Use: No      Review of Systems  Constitutional: Positive for fever and chills. Negative for diaphoresis and fatigue.  HENT: Negative for congestion, sore throat and neck pain.   Respiratory: Negative for cough.   Cardiovascular: Negative for chest  pain.  Gastrointestinal: Negative for nausea, vomiting, abdominal pain, anorexia and change in bowel habit.  Genitourinary: Positive for dysuria, hematuria and flank pain. Negative for testicular pain.  Musculoskeletal: Positive for back pain. Negative for myalgias, joint swelling and arthralgias.  Skin: Negative for rash.  Neurological: Negative for vertigo, weakness, numbness and headaches.  All other systems reviewed and are negative.    Allergies  Tape and Sulfa antibiotics  Home Medications   Current Outpatient  Rx  Name Route Sig Dispense Refill  . ALPRAZOLAM 0.5 MG PO TABS Oral Take 0.5 mg by mouth daily as needed. Anxiety/sleep    . AMLODIPINE BESYLATE 5 MG PO TABS Oral Take 5 mg by mouth daily.     . ATENOLOL 50 MG PO TABS Oral Take 50 mg by mouth daily.     Marland Kitchen CIPROFLOXACIN HCL 250 MG PO TABS Oral Take 1 tablet (250 mg total) by mouth 2 (two) times daily. 10 tablet 0  . HYDROCODONE-ACETAMINOPHEN 7.5-325 MG PO TABS Oral Take 1 tablet by mouth every 6 (six) hours as needed. For pain.    . IBUPROFEN 200 MG PO TABS Oral Take 600 mg by mouth every 6 (six) hours as needed. pain    . POTASSIUM CITRATE ER 10 MEQ (1080 MG) PO TBCR Oral Take 1 tablet (10 mEq total) by mouth 3 (three) times daily with meals.      Use samples provided  . TRAVOPROST (BAK FREE) 0.004 % OP SOLN Both Eyes Place 1 drop into both eyes daily.    Marland Kitchen VITAMIN C 500 MG PO TABS Oral Take 500 mg by mouth daily.      BP 140/94  Pulse 65  Temp 98.7 F (37.1 C) (Oral)  Resp 20  Wt 210 lb (95.255 kg)  SpO2 98%  Physical Exam  Nursing note and vitals reviewed. Constitutional: He is oriented to person, place, and time. He appears well-developed and well-nourished. No distress.  HENT:  Head: Normocephalic and atraumatic.  Right Ear: External ear normal.  Left Ear: External ear normal.  Nose: Nose normal.  Mouth/Throat: Oropharynx is clear and moist. No oropharyngeal exudate.  Eyes: Conjunctivae are normal. Pupils are equal, round, and reactive to light. No scleral icterus.  Neck: Normal range of motion. Neck supple.  Cardiovascular: Normal rate, regular rhythm and normal heart sounds.  Exam reveals no gallop and no friction rub.   No murmur heard. Pulmonary/Chest: Effort normal and breath sounds normal. No respiratory distress. He has no wheezes. He has no rales. He exhibits no tenderness.  Abdominal: Soft. Bowel sounds are normal. He exhibits no distension. There is no tenderness.       No CVA tenderness  Musculoskeletal: Normal  range of motion. He exhibits no edema and no tenderness.  Lymphadenopathy:    He has no cervical adenopathy.  Neurological: He is alert and oriented to person, place, and time. No cranial nerve deficit. He exhibits normal muscle tone. Coordination normal.  Skin: Skin is warm and dry. No rash noted. No erythema. No pallor.  Psychiatric: He has a normal mood and affect. His behavior is normal. Judgment and thought content normal.    ED Course  Procedures (including critical care time)  Labs Reviewed  URINALYSIS, ROUTINE W REFLEX MICROSCOPIC - Abnormal; Notable for the following:    Color, Urine AMBER (*)  BIOCHEMICALS MAY BE AFFECTED BY COLOR   APPearance CLOUDY (*)     Hgb urine dipstick LARGE (*)     Protein, ur 100 (*)  Leukocytes, UA LARGE (*)     All other components within normal limits  CBC - Abnormal; Notable for the following:    WBC 15.5 (*)     All other components within normal limits  DIFFERENTIAL - Abnormal; Notable for the following:    Neutro Abs 11.9 (*)     Monocytes Absolute 1.7 (*)     All other components within normal limits  POCT I-STAT, CHEM 8 - Abnormal; Notable for the following:    BUN 30 (*)     Creatinine, Ser 2.10 (*)     Glucose, Bld 115 (*)     All other components within normal limits  URINE MICROSCOPIC-ADD ON   No results found.   UTI Urinary incontinence    MDM  Patient here with UTI s/p bilateral stent placement with fever, though currently with normal vital signs, due to his age and kidney function we have spoken with Dr. Isabel Caprice and will admit the patient for IV antibiotics and to watch overnight.  Have not replaced the foley per patient request.  BUN and Cr are at baseline.       Izola Price Anza, [place] 06/16/11 [phone]

## 2011-06-16 NOTE — ED Notes (Signed)
Patient transported to X-ray 

## 2011-06-16 NOTE — ED Notes (Signed)
Pt states wife removed renal stints and foley cathter removed this afternoon under doctors advisement. States pain resolved for 15 minutes then returned but not the same type. Pt had stints placed yesterday. Pt states he also had a fever of 101 at home. Pt denies nausea or vomiting but c/o constipation. Pt also c/o urinary incontinence. Pt states he is able to empty bladder.

## 2011-06-16 NOTE — ED Notes (Signed)
Attempted to call report to floor  Nurse unable to take report at this time  She is to return my call when available

## 2011-06-16 NOTE — ED Notes (Signed)
Completed bladder scan 0 ml noted in bladder

## 2011-06-17 MED ORDER — OXYCODONE-ACETAMINOPHEN 5-325 MG PO TABS
1.0000 | ORAL_TABLET | ORAL | Status: DC | PRN
  Administered 2011-06-17: 2 via ORAL
  Administered 2011-06-17: 1 via ORAL
  Administered 2011-06-18 (×2): 2 via ORAL
  Filled 2011-06-17 (×3): qty 2
  Filled 2011-06-17: qty 1

## 2011-06-17 MED ORDER — AMLODIPINE BESYLATE 5 MG PO TABS
5.0000 mg | ORAL_TABLET | Freq: Every day | ORAL | Status: DC
  Administered 2011-06-17 – 2011-06-18 (×2): 5 mg via ORAL
  Filled 2011-06-17 (×3): qty 1

## 2011-06-17 MED ORDER — DOXYCYCLINE HYCLATE 50 MG PO CAPS: 100.0000 mg | ORAL_CAPSULE | Freq: Two times a day (BID) | ORAL | Status: AC

## 2011-06-17 MED ORDER — DIPHENHYDRAMINE HCL 25 MG PO CAPS
25.0000 mg | ORAL_CAPSULE | Freq: Every evening | ORAL | Status: DC | PRN
  Administered 2011-06-17: 25 mg via ORAL
  Filled 2011-06-17: qty 1

## 2011-06-17 MED ORDER — ALPRAZOLAM 0.5 MG PO TABS
0.5000 mg | ORAL_TABLET | Freq: Every evening | ORAL | Status: DC | PRN
  Administered 2011-06-17: 0.5 mg via ORAL
  Filled 2011-06-17: qty 1

## 2011-06-17 MED ORDER — DOXYCYCLINE HYCLATE 50 MG PO CAPS: 100.0000 mg | ORAL_CAPSULE | Freq: Two times a day (BID) | ORAL | Status: DC

## 2011-06-17 NOTE — H&P (Signed)
[name] is an 71 y.o. male.   Chief Complaint: Left flank pain fever HPI: The patient that we presented to the emergency room with complaints of some left-sided pain as well as fever status post endoscopic procedure by Dr. Samara Deist stent 24 hours earlier. The patient is had issues with bilateral nephrolithiasis. He recently underwent removal of a left double-J stent with treatment of ureteral stone and then treatment of right renal calculi. The patient had an indwelling Foley catheter and a stent bilaterally although one was on a dangle string. The patient socially had excruciating pain catheter was instructed to remove down along with the left double-J stent. The patient had increasing pain and also some fever to 101. He presented to the emergency room where he was noted to have white blood cell count of 15,000. We were contacted and was elected to admit him for observation IV fluids, pain management and IV antibiotics. The patient had been on oral ciprofloxacin. The patient received a gram of Rocephin. He feels better this morning with significant decreased pain in his overall voiding has improved. He has been afebrile overnight.  Past Medical History  Diagnosis Date  . Herniated disc   . Hypertension   . Renal calculi BILATERAL   . Chronic back pain   . History of TIA (transient ischemic attack) 2010--  NO RESIDUAL    NONE SINCE  . Dyslipidemia   . Bilateral flank pain     DUE TO KIDNEY STONES  . Weakness of both legs   . Eczema   . History of kidney stones   . Arthritis   . Frequency of urination   . Urgency of urination   . Nocturia   . Hematuria   . Anxiety   . Glaucoma PT STATES RX RAN OUT  . Glaucoma     Past Surgical History  Procedure Date  . Percutaneous nephrolithotomy 12-10-2007  &  11-07-2005    RIGHT RENAL CALCULI  . Cysto/ bilateral retrograde pyelogram/ left ureterscopic stone extraction 11-14-2005    LEFT URETERAL CALCULUS  . Cysto/ bilateral ureteral  stents 11-06-2005  . Right ureteroscopic stone extraction 05-29-2003  . Right ureteral stent placement 02-10-2003  . Repair left inguinal hernia w/ mesh 12-23-2002  . Internal urethrotomy/ turp 12-23-2002    BPH W/ STRICTURE  . Cysto/ left retrograde pyelogram/  left stent placment 05-21-2011  . Extracorporeal shock wave lithotripsy   . Transthoracic echocardiogram 12-19-2008    LVSF NORMAL/ EF 55-60%/ GRADE I DIASTOLIC DYSFUNCTION/     No family history on file. Social History:  reports that he quit smoking about 21 years ago. His smoking use included Cigarettes. He quit after 40 years of use. He has never used smokeless tobacco. He reports that he does not drink alcohol or use illicit drugs.  Allergies:  Allergies  Allergen Reactions  . Tape Other (See Comments)    Redness and blisters/paper tape-not latex  . Sulfa Antibiotics Hives    Medications Prior to Admission  Medication Sig Dispense Refill  . ALPRAZolam (XANAX) 0.5 MG tablet Take 0.5 mg by mouth daily as needed. Anxiety/sleep      . amLODipine (NORVASC) 5 MG tablet Take 5 mg by mouth daily.       Marland Kitchen atenolol (TENORMIN) 50 MG tablet Take 50 mg by mouth daily.       . ciprofloxacin (CIPRO) 250 MG tablet Take 1 tablet (250 mg total) by mouth 2 (two) times daily.  10 tablet  0  . HYDROcodone-acetaminophen (NORCO) 7.5-325 MG per tablet Take 1 tablet by mouth every 6 (six) hours as needed. For pain.      Marland Kitchen ibuprofen (ADVIL,MOTRIN) 200 MG tablet Take 600 mg by mouth every 6 (six) hours as needed. pain      . potassium citrate (UROCIT-K) 10 MEQ (1080 MG) SR tablet Take 1 tablet (10 mEq total) by mouth 3 (three) times daily with meals.      . Travoprost, BAK Free, (TRAVATAN) 0.004 % SOLN ophthalmic solution Place 1 drop into both eyes daily.      . vitamin C (ASCORBIC ACID) 500 MG tablet Take 500 mg by mouth daily.        Results for orders placed during the hospital encounter of 06/16/11 (from the past 48 hour(s))  URINALYSIS,  ROUTINE W REFLEX MICROSCOPIC     Status: Abnormal   Collection Time   06/16/11  7:26 PM      Component Value Range Comment   Color, Urine AMBER (*) YELLOW BIOCHEMICALS MAY BE AFFECTED BY COLOR   APPearance CLOUDY (*) CLEAR    Specific Gravity, Urine 1.016  1.005 - 1.030    pH 6.0  5.0 - 8.0    Glucose, UA NEGATIVE  NEGATIVE mg/dL    Hgb urine dipstick LARGE (*) NEGATIVE    Bilirubin Urine NEGATIVE  NEGATIVE    Ketones, ur NEGATIVE  NEGATIVE mg/dL    Protein, ur 409 (*) NEGATIVE mg/dL    Urobilinogen, UA 0.2  0.0 - 1.0 mg/dL    Nitrite NEGATIVE  NEGATIVE    Leukocytes, UA LARGE (*) NEGATIVE   URINE MICROSCOPIC-ADD ON     Status: Normal   Collection Time   06/16/11  7:26 PM      Component Value Range Comment   Squamous Epithelial / LPF RARE  RARE    WBC, UA 21-50  <3 WBC/hpf    RBC / HPF TOO NUMEROUS TO COUNT  <3 RBC/hpf    Bacteria, UA RARE  RARE   CBC     Status: Abnormal   Collection Time   06/16/11  8:00 PM      Component Value Range Comment   WBC 15.5 (*) 4.0 - 10.5 K/uL    RBC 4.80  4.22 - 5.81 MIL/uL    Hemoglobin 15.7  13.0 - 17.0 g/dL    HCT 81.1  91.4 - 78.2 %    MCV 95.6  78.0 - 100.0 fL    MCH 32.7  26.0 - 34.0 pg    MCHC 34.2  30.0 - 36.0 g/dL    RDW 95.6  21.3 - 08.6 %    Platelets 184  150 - 400 K/uL   DIFFERENTIAL     Status: Abnormal   Collection Time   06/16/11  8:00 PM      Component Value Range Comment   Neutrophils Relative 76  43 - 77 %    Neutro Abs 11.9 (*) 1.7 - 7.7 K/uL    Lymphocytes Relative 13  12 - 46 %    Lymphs Abs 1.9  0.7 - 4.0 K/uL    Monocytes Relative 11  3 - 12 %    Monocytes Absolute 1.7 (*) 0.1 - 1.0 K/uL    Eosinophils Relative 0  0 - 5 %    Eosinophils Absolute 0.0  0.0 - 0.7 K/uL    Basophils Relative 0  0 - 1 %    Basophils Absolute 0.0  0.0 - 0.1  K/uL   POCT I-STAT, CHEM 8     Status: Abnormal   Collection Time   06/16/11  8:13 PM      Component Value Range Comment   Sodium 141  135 - 145 mEq/L    Potassium 4.5  3.5 - 5.1  mEq/L    Chloride 103  96 - 112 mEq/L    BUN 30 (*) 6 - 23 mg/dL    Creatinine, Ser 1.61 (*) 0.50 - 1.35 mg/dL    Glucose, Bld 096 (*) 70 - 99 mg/dL    Calcium, Ion 0.45  4.09 - 1.32 mmol/L    TCO2 26  0 - 100 mmol/L    Hemoglobin 16.7  13.0 - 17.0 g/dL    HCT 81.1  91.4 - 78.2 %    Dg Abd 1 View  06/17/2011  *RADIOLOGY REPORT*  Clinical Data: Incontinence.  Recent right ureteral stent placement.  ABDOMEN - 1 VIEW  Comparison: 05/27/2011 from [facility].  Findings: Single supine view of the abdomen and pelvis.  A ureteric stent projects with ends over the right renal pelvis and right- sided urinary bladder.  The dominant right renal calculus is no longer identified.  No left-sided urinary tract calculi are seen.  Nonobstructive bowel gas pattern, with a moderate-to-large amount of ascending colonic stool.  Distal gas. The left-sided ureteric stent has been removed since the prior.  IMPRESSION: Interval placement of a right ureteric stent.  This appears appropriately positioned.  Dominant right renal calculus is no longer visualized.  Original Report Authenticated By: Consuello Bossier, M.D.    Review of Systems - Negative except positive symptoms mentioned above fever discomfort increased urinary urgency and incontinence.  Blood pressure 133/80, pulse 67, temperature 98.1 F (36.7 C), temperature source Oral, resp. rate 16, height 6' (1.829 m), weight 94.6 kg (208 lb 8.9 oz), SpO2 95.00%. General appearance: alert, cooperative and no distress Neck:  no JVD Resp:nl effort Cardio: regular rate and rhythm GI: soft, non-tender; bowel sounds normal; no masses,  no organomegaly Male genitalia: normal, penis: no lesions or discharge. testes: no masses or tenderness. no hernias Extremities: extremities normal, atraumatic, no cyanosis or edema Skin: Skin color, texture, turgor normal. No rashes or lesions Neurologic: Grossly normal  Assessment/Plan Transient renal colic and fever status post  removal of left double-J stent. The patient had a KUB last night which showed the right double-J stent to be in good position. Clinically the patient is improved. We will notify Dr. Aris [place] the patient's admission. Rosaland Shiffman S 06/17/2011, 8:11 AM

## 2011-06-17 NOTE — Progress Notes (Signed)
  Subjective: Patient reports less pain. He is voiding OK  Objective: Vital signs in last 24 hours: Temp:  [97.6 F (36.4 C)-98.7 F (37.1 C)] 97.8 F (36.6 C) (06/14 1412) Pulse Rate:  [65-79] 79  (06/14 1412) Resp:  [16-20] 16  (06/14 1412) BP: (131-157)/(78-94) 139/78 mmHg (06/14 1412) SpO2:  [94 %-100 %] 94 % (06/14 1412) Weight:  [94.6 kg (208 lb 8.9 oz)-95.255 kg (210 lb)] 94.6 kg (208 lb 8.9 oz) (06/14 0100)  Intake/Output from previous day: 06/13 0701 - 06/14 0700 In: 802.1 [I.V.:802.1] Out: 925 [Urine:925] Intake/Output this shift: Total I/O In: -  Out: 300 [Urine:300]  Physical Exam:  Constitutional: Vital signs reviewed. WD WN in NAD    Urine brown w/o clots  Lab Results:  Basename 06/16/11 2013 06/16/11 2000 06/15/11 0946  HGB 16.7 15.7 15.6  HCT 49.0 45.9 46.0   BMET  Basename 06/16/11 2013 06/15/11 0946  NA 141 143  K 4.5 4.1  CL 103 --  CO2 -- --  GLUCOSE 115* 114*  BUN 30* --  CREATININE 2.10* --  CALCIUM -- --   No results found for this basename: LABPT:3,INR:3 in the last 72 hours No results found for this basename: LABURIN:1 in the last 72 hours Results for orders placed during the hospital encounter of 01/13/10  SURGICAL PCR SCREEN     Status: Normal   Collection Time   01/08/10 11:40 AM      Component Value Range Status Comment   MRSA, PCR NEGATIVE  NEGATIVE Final    Staphylococcus aureus    NEGATIVE Final    Value: NEGATIVE            The Xpert SA Assay (FDA     approved for NASAL specimens     only), is one component of     a comprehensive surveillance     program.  It is not intended     to diagnose infection nor to     guide or monitor treatment.    Studies/Results: Dg Abd 1 View  06/17/2011  *RADIOLOGY REPORT*  Clinical Data: Incontinence.  Recent right ureteral stent placement.  ABDOMEN - 1 VIEW  Comparison: 05/27/2011 from [facility].  Findings: Single supine view of the abdomen and pelvis.  A ureteric stent projects  with ends over the right renal pelvis and right- sided urinary bladder.  The dominant right renal calculus is no longer identified.  No left-sided urinary tract calculi are seen.  Nonobstructive bowel gas pattern, with a moderate-to-large amount of ascending colonic stool.  Distal gas. The left-sided ureteric stent has been removed since the prior.  IMPRESSION: Interval placement of a right ureteric stent.  This appears appropriately positioned.  Dominant right renal calculus is no longer visualized.  Original Report Authenticated By: Consuello Bossier, M.D.    Assessment/Plan:   S/p bilateral ureteroscopy w/ fever and stent pain. Feeling better  OK to go home tomorrow if SCr better and no fever/comfortable. Will switch to doxycycline for home use.   LOS: 1 day   [name] 06/17/2011, 6:05 PM

## 2011-06-17 NOTE — ED Provider Notes (Signed)
Medical screening examination/treatment/procedure(s) were conducted as a shared visit with non-physician practitioner(s) and myself.  I personally evaluated the patient during the encounter.  This 71 year old male presents one day after urologic intervention, now with incontinence, fever, pain.  On exam he is in no distress,with mild pain.   Given the patient's leukocytosis, fever, the persistency of pain, I discussed the case with the urologist on call (Dr. Isabel Caprice).  With the recent intervention, the patient will receive Rocephin, have KUB to evaluate for migration of stents, and be admitted for further evaluation and management of what is likely post-procedural discomfort / pain but may represent infection.  Gerhard Munch, MD 06/17/11 [phone]

## 2011-06-18 NOTE — Discharge Instructions (Signed)
Call MD for fever >101, severe pain, or inability to urinate

## 2011-06-18 NOTE — Discharge Summary (Signed)
Physician Discharge Summary  Patient ID: PARVIN STETZER MRN: [id number] DOB/AGE: 03-18-1940 71 y.o.  Admit date: 06/16/2011 Discharge date: 06/18/2011  Admission Diagnoses: UTI, h/o kidney stones  Discharge Diagnoses: UTI   Discharged Condition: good  Hospital Course:  Pt admitted 6-13 for fevers of 101 after bilateral ureteroscopy for stones the previous day. Given empiric IV ABX which were gradually transitioned to PO. He quickly defervesce. On 6-14 his foley and one of his JJ stents was removed. By 6-15, the day of discharge, he was afebrile, well feeling, tolerating a diet, ambulatory, and pain controlled.  Consults: None  Significant Diagnostic Studies: labs: UA,Urine microscopy - no organisms 6-13  Treatments: IV antibiotics  Discharge Exam: Blood pressure 142/88, pulse 73, temperature 98 F (36.7 C), temperature source Oral, resp. rate 20, height 6' (1.829 m), weight 94.6 kg (208 lb 8.9 oz), SpO2 98.00%. General appearance: alert, cooperative and appears stated age Head: Normocephalic, without obvious abnormality, atraumatic Nose: Nares normal. Septum midline. Mucosa normal. No drainage or sinus tenderness. Throat: lips, mucosa, and tongue normal; teeth and gums normal Back: symmetric, no curvature. ROM normal. No CVA tenderness. Resp: clear to auscultation bilaterally GI: soft, non-tender; bowel sounds normal; no masses,  no organomegaly Male genitalia: normal Extremities: extremities normal, atraumatic, no cyanosis or edema Pulses: 2+ and symmetric Skin: Skin color, texture, turgor normal. No rashes or lesions Neurologic: Grossly normal  Disposition: 01-Home or Self Care   Medication List  As of 06/18/2011  7:24 AM   STOP taking these medications         ciprofloxacin 250 MG tablet         TAKE these medications         ALPRAZolam 0.5 MG tablet   Commonly known as: XANAX   Take 0.5 mg by mouth daily as needed. Anxiety/sleep      amLODipine 5 MG tablet   Commonly known as: NORVASC   Take 5 mg by mouth daily.      atenolol 50 MG tablet   Commonly known as: TENORMIN   Take 50 mg by mouth daily.      doxycycline 50 MG capsule   Commonly known as: VIBRAMYCIN   Take 2 capsules (100 mg total) by mouth 2 (two) times daily.      HYDROcodone-acetaminophen 7.5-325 MG per tablet   Commonly known as: NORCO   Take 1 tablet by mouth every 6 (six) hours as needed. For pain.      ibuprofen 200 MG tablet   Commonly known as: ADVIL,MOTRIN   Take 600 mg by mouth every 6 (six) hours as needed. pain      potassium citrate 10 MEQ (1080 MG) SR tablet   Commonly known as: UROCIT-K   Take 1 tablet (10 mEq total) by mouth 3 (three) times daily with meals.      Travoprost (BAK Free) 0.004 % Soln ophthalmic solution   Commonly known as: TRAVATAN   Place 1 drop into both eyes daily.      vitamin C 500 MG tablet   Commonly known as: ASCORBIC ACID   Take 500 mg by mouth daily.           Follow-up Information    Follow up with DAHLSTEDT, Bertram Millard, MD. (as previously scheduled)    Contact information:   [address] [place] [address] [phone]          Signed: Sebastian Ache 06/18/2011, 7:24 AM

## 2011-06-20 ENCOUNTER — Encounter (HOSPITAL_BASED_OUTPATIENT_CLINIC_OR_DEPARTMENT_OTHER): Payer: Self-pay | Admitting: Urology

## 2011-06-20 NOTE — Discharge Summary (Signed)
Patient ID: [name] MRN: [id number] DOB/AGE: 1940-03-20 71 y.o.  Admit date: 05/21/2011 Discharge date: 06/20/2011  Primary Care Physician:  No primary provider on file.  Discharge Diagnoses:   Bilateral nephrolithiasis Renal insufficiency Consults:  None    Discharge Medications: Medication List  As of 06/20/2011 10:00 AM   STOP taking these medications         potassium citrate 10 MEQ (1080 MG) SR tablet         TAKE these medications         ALPRAZolam 0.5 MG tablet   Commonly known as: XANAX   Take 0.5 mg by mouth daily as needed. Anxiety/sleep      atenolol 50 MG tablet   Commonly known as: TENORMIN   Take 50 mg by mouth daily.      ibuprofen 200 MG tablet   Commonly known as: ADVIL,MOTRIN   Take 600 mg by mouth every 6 (six) hours as needed. pain      oxyCODONE-acetaminophen 7.5-500 MG per tablet   Commonly known as: PERCOCET   Take 1 tablet by mouth every 4 (four) hours as needed for pain.      vitamin C 500 MG tablet   Commonly known as: ASCORBIC ACID   Take 500 mg by mouth daily.             Significant Diagnostic Studies:  Dg Abd 1 View  06/17/2011  *RADIOLOGY REPORT*  Clinical Data: Incontinence.  Recent right ureteral stent placement.  ABDOMEN - 1 VIEW  Comparison: 05/27/2011 from [facility].  Findings: Single supine view of the abdomen and pelvis.  A ureteric stent projects with ends over the right renal pelvis and right- sided urinary bladder.  The dominant right renal calculus is no longer identified.  No left-sided urinary tract calculi are seen.  Nonobstructive bowel gas pattern, with a moderate-to-large amount of ascending colonic stool.  Distal gas. The left-sided ureteric stent has been removed since the prior.  IMPRESSION: Interval placement of a right ureteric stent.  This appears appropriately positioned.  Dominant right renal calculus is no longer visualized.  Original Report Authenticated By: Consuello Bossier, M.D.    Brief H  and P: For complete details please refer to admission H and P, but in brief the pt was admitted for emergent stent placement of a ureteral stent for obstruction of his left kidney.  Hospital Course:  The pt was admitted for urgent stent placement. He had a mild fever that resolved w/ abx mgmt.  Day of Discharge BP 171/99  Pulse 73  Temp 99.2 F (37.3 C) (Oral)  Resp 20  Ht 6' (1.829 m)  Wt 95.165 kg (209 lb 12.8 oz)  BMI 28.45 kg/m2  SpO2 94%  No results found for this or any previous visit (from the past 24 hour(s)).  Physical Exam: General: Alert and awake oriented x3 not in any acute distress. HEENT: anicteric sclera, pupils reactive to light and accommodation CVS: S1-S2 clear no murmur rubs or gallops Chest: clear to auscultation bilaterally, no wheezing rales or rhonchi Abdomen: soft nontender, nondistended, normal bowel sounds, no organomegaly Extremities: no cyanosis, clubbing or edema noted bilaterally Neuro: Cranial nerves II-XII intact, no focal neurological deficits  Disposition: Home   Diet: No restrictions  Activity: No restrictions   Disposition and Follow-up:   1-2 weeks  TESTS THAT NEED FOLLOW-UP none  DISCHARGE FOLLOW-UP   Time spent on Discharge: 15 minutes  Signed: [name] 06/20/2011, 10:00 AM

## 2012-01-11 DIAGNOSIS — R972 Elevated prostate specific antigen [PSA]: Secondary | ICD-10-CM

## 2012-01-11 HISTORY — DX: Elevated prostate specific antigen (PSA): R97.20

## 2012-01-17 ENCOUNTER — Emergency Department (INDEPENDENT_AMBULATORY_CARE_PROVIDER_SITE_OTHER): Payer: Medicare Other

## 2012-01-17 ENCOUNTER — Encounter (HOSPITAL_COMMUNITY): Payer: Self-pay | Admitting: Emergency Medicine

## 2012-01-17 ENCOUNTER — Emergency Department (INDEPENDENT_AMBULATORY_CARE_PROVIDER_SITE_OTHER)
Admission: EM | Admit: 2012-01-17 | Discharge: 2012-01-17 | Disposition: A | Discharge status: Home / Self Care | Payer: Medicare Other | Source: Home / Self Care | Attending: Family Medicine | Admitting: Family Medicine

## 2012-01-17 DIAGNOSIS — S335XXA Sprain of ligaments of lumbar spine, initial encounter: Secondary | ICD-10-CM

## 2012-01-17 DIAGNOSIS — S39012A Strain of muscle, fascia and tendon of lower back, initial encounter: Secondary | ICD-10-CM

## 2012-01-17 MED ORDER — HYDROCODONE-ACETAMINOPHEN 5-325 MG PO TABS: 2.0000 | ORAL_TABLET | ORAL | Status: DC | PRN

## 2012-01-17 NOTE — ED Provider Notes (Signed)
Medical screening examination/treatment/procedure(s) were performed by non-physician practitioner and as supervising physician I was immediately available for consultation/collaboration.  Leslee Home, M.D.   Reuben Likes, MD 01/17/12 [phone]
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
## 2012-03-02 ENCOUNTER — Other Ambulatory Visit: Payer: Self-pay | Admitting: Urology

## 2012-03-03 DIAGNOSIS — Z8546 Personal history of malignant neoplasm of prostate: Secondary | ICD-10-CM

## 2012-03-03 DIAGNOSIS — C61 Malignant neoplasm of prostate: Secondary | ICD-10-CM

## 2012-03-03 HISTORY — DX: Personal history of malignant neoplasm of prostate: Z85.46

## 2012-03-03 HISTORY — DX: Malignant neoplasm of prostate: C61

## 2012-03-13 ENCOUNTER — Encounter (HOSPITAL_COMMUNITY)
Admission: RE | Admit: 2012-03-13 | Discharge: 2012-03-13 | Disposition: A | Discharge status: Home / Self Care | Payer: Medicare Other | Source: Ambulatory Visit | Attending: Urology | Admitting: Urology

## 2012-03-13 DIAGNOSIS — C61 Malignant neoplasm of prostate: Secondary | ICD-10-CM | POA: Insufficient documentation

## 2012-03-13 MED ORDER — TECHNETIUM TC 99M MEDRONATE IV KIT
25.0000 | PACK | Freq: Once | INTRAVENOUS | Status: AC | PRN
  Administered 2012-03-13: 25 via INTRAVENOUS

## 2012-04-10 ENCOUNTER — Observation Stay (HOSPITAL_COMMUNITY)
Admission: EM | Admit: 2012-04-10 | Discharge: 2012-04-11 | Disposition: A | Discharge status: Home / Self Care | Payer: Medicare Other | Attending: Family Medicine | Admitting: Family Medicine

## 2012-04-10 ENCOUNTER — Encounter (HOSPITAL_COMMUNITY): Payer: Self-pay | Admitting: *Deleted

## 2012-04-10 ENCOUNTER — Emergency Department (HOSPITAL_COMMUNITY): Payer: Medicare Other

## 2012-04-10 DIAGNOSIS — Z9189 Other specified personal risk factors, not elsewhere classified: Secondary | ICD-10-CM

## 2012-04-10 DIAGNOSIS — M545 Low back pain, unspecified: Secondary | ICD-10-CM | POA: Insufficient documentation

## 2012-04-10 DIAGNOSIS — E785 Hyperlipidemia, unspecified: Secondary | ICD-10-CM | POA: Insufficient documentation

## 2012-04-10 DIAGNOSIS — R079 Chest pain, unspecified: Principal | ICD-10-CM

## 2012-04-10 DIAGNOSIS — M5136 Other intervertebral disc degeneration, lumbar region: Secondary | ICD-10-CM

## 2012-04-10 DIAGNOSIS — M549 Dorsalgia, unspecified: Secondary | ICD-10-CM

## 2012-04-10 DIAGNOSIS — F411 Generalized anxiety disorder: Secondary | ICD-10-CM | POA: Insufficient documentation

## 2012-04-10 DIAGNOSIS — R0602 Shortness of breath: Secondary | ICD-10-CM | POA: Insufficient documentation

## 2012-04-10 DIAGNOSIS — I10 Essential (primary) hypertension: Secondary | ICD-10-CM

## 2012-04-10 HISTORY — DX: Malignant neoplasm of prostate: C61

## 2012-04-10 LAB — URINALYSIS, ROUTINE W REFLEX MICROSCOPIC
Nitrite: NEGATIVE
Specific Gravity, Urine: 1.019 (ref 1.005–1.030)
Urobilinogen, UA: 0.2 mg/dL (ref 0.0–1.0)

## 2012-04-10 LAB — CBC
HCT: 41.5 % (ref 39.0–52.0)
Hemoglobin: 14.8 g/dL (ref 13.0–17.0)
MCH: 33.7 pg (ref 26.0–34.0)
MCHC: 35.7 g/dL (ref 30.0–36.0)
MCV: 94.5 fL (ref 78.0–100.0)

## 2012-04-10 LAB — COMPREHENSIVE METABOLIC PANEL
BUN: 18 mg/dL (ref 6–23)
Calcium: 9.5 mg/dL (ref 8.4–10.5)
Creatinine, Ser: 1.57 mg/dL — ABNORMAL HIGH (ref 0.50–1.35)
GFR calc Af Amer: 49 mL/min — ABNORMAL LOW (ref >90)
Glucose, Bld: 110 mg/dL — ABNORMAL HIGH (ref 70–99)
Total Protein: 6.7 g/dL (ref 6.0–8.3)

## 2012-04-10 LAB — TROPONIN I
Troponin I: <0.3 ng/mL (ref <0.30)
Troponin I: <0.3 ng/mL (ref <0.30)

## 2012-04-10 LAB — URINE MICROSCOPIC-ADD ON

## 2012-04-10 MED ORDER — AMLODIPINE BESYLATE 5 MG PO TABS
5.0000 mg | ORAL_TABLET | Freq: Every day | ORAL | Status: DC
  Administered 2012-04-11: 5 mg via ORAL
  Filled 2012-04-10: qty 1

## 2012-04-10 MED ORDER — ATENOLOL 50 MG PO TABS
50.0000 mg | ORAL_TABLET | Freq: Every day | ORAL | Status: DC
  Administered 2012-04-11: 50 mg via ORAL
  Filled 2012-04-10: qty 1

## 2012-04-10 MED ORDER — SODIUM CHLORIDE 0.9 % IJ SOLN
3.0000 mL | Freq: Two times a day (BID) | INTRAMUSCULAR | Status: DC
  Administered 2012-04-10 – 2012-04-11 (×2): 3 mL via INTRAVENOUS

## 2012-04-10 MED ORDER — ASPIRIN EC 81 MG PO TBEC
81.0000 mg | DELAYED_RELEASE_TABLET | Freq: Every day | ORAL | Status: DC
  Administered 2012-04-11: 81 mg via ORAL
  Filled 2012-04-10: qty 1

## 2012-04-10 MED ORDER — TRAVOPROST (BAK FREE) 0.004 % OP SOLN
1.0000 [drp] | Freq: Every day | OPHTHALMIC | Status: DC
  Administered 2012-04-10: 1 [drp] via OPHTHALMIC
  Filled 2012-04-10: qty 2.5

## 2012-04-10 MED ORDER — HYDRALAZINE HCL 20 MG/ML IJ SOLN
5.0000 mg | Freq: Once | INTRAMUSCULAR | Status: AC
  Administered 2012-04-10: 5 mg via INTRAVENOUS
  Filled 2012-04-10: qty 1

## 2012-04-10 MED ORDER — METOPROLOL TARTRATE 1 MG/ML IV SOLN
5.0000 mg | Freq: Once | INTRAVENOUS | Status: DC
  Filled 2012-04-10: qty 5

## 2012-04-10 MED ORDER — BRINZOLAMIDE 1 % OP SUSP
1.0000 [drp] | Freq: Three times a day (TID) | OPHTHALMIC | Status: DC
  Administered 2012-04-11: 1 [drp] via OPHTHALMIC
  Filled 2012-04-10: qty 10

## 2012-04-10 MED ORDER — BRINZOLAMIDE-BRIMONIDINE 1-0.2 % OP SUSP: 1.0000 [drp] | Freq: Three times a day (TID) | OPHTHALMIC | Status: DC

## 2012-04-10 MED ORDER — ALPRAZOLAM 0.5 MG PO TABS
0.5000 mg | ORAL_TABLET | Freq: Every day | ORAL | Status: DC | PRN
  Administered 2012-04-10: 0.5 mg via ORAL
  Filled 2012-04-10: qty 1

## 2012-04-10 MED ORDER — ENOXAPARIN SODIUM 40 MG/0.4ML ~~LOC~~ SOLN
40.0000 mg | SUBCUTANEOUS | Status: DC
  Administered 2012-04-11: 40 mg via SUBCUTANEOUS
  Filled 2012-04-10: qty 0.4

## 2012-04-10 MED ORDER — HYDRALAZINE HCL 20 MG/ML IJ SOLN: 5.0000 mg | Freq: Four times a day (QID) | INTRAMUSCULAR | Status: DC | PRN

## 2012-04-10 MED ORDER — ASPIRIN 81 MG PO CHEW
324.0000 mg | CHEWABLE_TABLET | Freq: Once | ORAL | Status: AC
  Administered 2012-04-10: 324 mg via ORAL
  Filled 2012-04-10: qty 4

## 2012-04-10 MED ORDER — ACETAMINOPHEN 325 MG PO TABS
650.0000 mg | ORAL_TABLET | Freq: Four times a day (QID) | ORAL | Status: DC | PRN
  Administered 2012-04-10: 650 mg via ORAL
  Filled 2012-04-10: qty 2

## 2012-04-10 MED ORDER — BRIMONIDINE TARTRATE 0.2 % OP SOLN
1.0000 [drp] | Freq: Three times a day (TID) | OPHTHALMIC | Status: DC
  Administered 2012-04-11: 1 [drp] via OPHTHALMIC
  Filled 2012-04-10: qty 5

## 2012-04-10 MED ORDER — MORPHINE SULFATE 2 MG/ML IJ SOLN
2.0000 mg | INTRAMUSCULAR | Status: DC | PRN
Start: 2012-04-10 — End: 2012-04-11
  Administered 2012-04-11: 2 mg via INTRAVENOUS
  Filled 2012-04-10: qty 1

## 2012-04-10 NOTE — ED Provider Notes (Signed)
History     CSN: [id number]  Arrival date & time 04/10/12  1657   First MD Initiated Contact with Patient 04/10/12 1715      Chief Complaint  Patient presents with  . Chest Pain    (Consider location/radiation/quality/duration/timing/severity/associated sxs/prior treatment) Patient is a 72 y.o. male presenting with chest pain.  Chest Pain  Pt with history of renal stones and renal insufficiency reports 1-2 weeks of intermittent wheezing SOB without cough or fever. Associated with L chest soreness worse with deep breath. Denies any vomiting. No particular provoking or relieving factors. He has had some constipation and abdominal bloating as well. No known CAD. Has HTN but does not see PCP anymore.   Past Medical History  Diagnosis Date  . Herniated disc   . Hypertension   . Renal calculi BILATERAL   . Chronic back pain   . History of TIA (transient ischemic attack) 2010--  NO RESIDUAL    NONE SINCE  . Dyslipidemia   . Bilateral flank pain     DUE TO KIDNEY STONES  . Weakness of both legs   . Eczema   . History of kidney stones   . Arthritis   . Frequency of urination   . Urgency of urination   . Nocturia   . Hematuria   . Anxiety   . Glaucoma PT STATES RX RAN OUT  . Glaucoma     Past Surgical History  Procedure Laterality Date  . Percutaneous nephrolithotomy  12-10-2007  &  11-07-2005    RIGHT RENAL CALCULI  . Cysto/ bilateral retrograde pyelogram/ left ureterscopic stone extraction  11-14-2005    LEFT URETERAL CALCULUS  . Cysto/ bilateral ureteral stents  11-06-2005  . Right ureteroscopic stone extraction  05-29-2003  . Right ureteral stent placement  02-10-2003  . Repair left inguinal hernia w/ mesh  12-23-2002  . Internal urethrotomy/ turp  12-23-2002    BPH W/ STRICTURE  . Cysto/ left retrograde pyelogram/  left stent placment  05-21-2011  . Extracorporeal shock wave lithotripsy    . Transthoracic echocardiogram  12-19-2008    LVSF NORMAL/ EF 55-60%/ GRADE  I DIASTOLIC DYSFUNCTION/   . Cystoscopy w/ ureteral stent removal  06/15/2011    Procedure: CYSTOSCOPY WITH STENT REMOVAL;  Surgeon: Marcine Matar, MD;  Location: [facility];  Service: Urology;  Laterality: Left;  . Ureteroscopy  06/15/2011    Procedure: URETEROSCOPY;  Surgeon: Marcine Matar, MD;  Location: [facility];  Service: Urology;  Laterality: Bilateral;  . Biliary stent placement  06/15/2011    Procedure: BILIARY STENT PLACEMENT;  Surgeon: Marcine Matar, MD;  Location: [facility];  Service: Urology;  Laterality: Bilateral;    No family history on file.  History  Substance Use Topics  . Smoking status: Former Smoker -- 40 years    Types: Cigarettes    Quit date: 06/10/1990  . Smokeless tobacco: Never Used  . Alcohol Use: No      Review of Systems  Cardiovascular: Positive for chest pain.   All other systems reviewed and are negative except as noted in HPI.   Allergies  Tape and Sulfa antibiotics  Home Medications   Current Outpatient Rx  Name  Route  Sig  Dispense  Refill  . ALPRAZolam (XANAX) 0.5 MG tablet   Oral   Take 0.5 mg by mouth daily as needed. Anxiety/sleep         . amLODipine (NORVASC) 5 MG tablet   Oral  Take 5 mg by mouth daily.          Marland Kitchen atenolol (TENORMIN) 50 MG tablet   Oral   Take 50 mg by mouth daily.          Marland Kitchen HYDROcodone-acetaminophen (NORCO) 7.5-325 MG per tablet   Oral   Take 1 tablet by mouth every 6 (six) hours as needed. For pain.         Marland Kitchen HYDROcodone-acetaminophen (NORCO/VICODIN) 5-325 MG per tablet   Oral   Take 2 tablets by mouth every 4 (four) hours as needed for pain.   10 tablet   0   . ibuprofen (ADVIL,MOTRIN) 200 MG tablet   Oral   Take 600 mg by mouth every 6 (six) hours as needed. pain         . potassium citrate (UROCIT-K) 10 MEQ (1080 MG) SR tablet   Oral   Take 1 tablet (10 mEq total) by mouth 3 (three) times daily with meals.            Use samples provided   . Travoprost, BAK Free, (TRAVATAN) 0.004 % SOLN ophthalmic solution   Both Eyes   Place 1 drop into both eyes daily.         . vitamin C (ASCORBIC ACID) 500 MG tablet   Oral   Take 500 mg by mouth daily.           BP 148/95  Pulse 65  Temp(Src) 97.6 F (36.4 C) (Oral)  Resp 18  Ht 6' (1.829 m)  Wt 315 lb (142.883 kg)  BMI 42.71 kg/m2  SpO2 93%  Physical Exam  Nursing note and vitals reviewed. Constitutional: He is oriented to person, place, and time. He appears well-developed and well-nourished.  HENT:  Head: Normocephalic and atraumatic.  Eyes: EOM are normal. Pupils are equal, round, and reactive to light.  Neck: Normal range of motion. Neck supple.  Cardiovascular: Normal rate, normal heart sounds and intact distal pulses.   Pulmonary/Chest: Effort normal and breath sounds normal.  Abdominal: Bowel sounds are normal. He exhibits no distension. There is no tenderness.  Musculoskeletal: Normal range of motion. He exhibits no edema and no tenderness.  Neurological: He is alert and oriented to person, place, and time. He has normal strength. No cranial nerve deficit or sensory deficit.  Skin: Skin is warm and dry. No rash noted.  Psychiatric: He has a normal mood and affect.    ED Course  Procedures (including critical care time)  Labs Reviewed  CBC - Abnormal; Notable for the following:    Platelets 139 (*)    All other components within normal limits  PRO B NATRIURETIC PEPTIDE - Abnormal; Notable for the following:    Pro B Natriuretic peptide (BNP) 401.5 (*)    All other components within normal limits  COMPREHENSIVE METABOLIC PANEL - Abnormal; Notable for the following:    Glucose, Bld 110 (*)    Creatinine, Ser 1.57 (*)    GFR calc non Af Amer 43 (*)    GFR calc Af Amer 49 (*)    All other components within normal limits  URINALYSIS, ROUTINE W REFLEX MICROSCOPIC - Abnormal; Notable for the following:    APPearance HAZY (*)    Hgb  urine dipstick LARGE (*)    All other components within normal limits  TROPONIN I  URINE MICROSCOPIC-ADD ON   Dg Chest 2 View  04/10/2012  *RADIOLOGY REPORT*  Clinical Data: Chest pain shortness of breath  CHEST -  2 VIEW  Comparison: 05/21/2011  Findings: Low lung volumes with elevated hemidiaphragms.  Normal heart size and slight vascular prominence.  No definite focal airspace process, collapse, consolidation, edema, effusion or pneumothorax.  Trachea midline.  IMPRESSION: Low volume exam without superimposed acute process.   Original Report Authenticated By: Judie Petit. Miles Costain, M.D.      No diagnosis found.    MDM   Date: 04/10/2012  Rate: 61  Rhythm: normal sinus rhythm  QRS Axis: normal  Intervals: normal  ST/T Wave abnormalities: nonspecific T wave changes  Conduction Disutrbances:none  Narrative Interpretation:   Old EKG Reviewed: none available  Pt having CP/SOB. Neg EKG and Trop. No PCP for followup. Discussed with [facility] resident who will admit for rule out and to establish PCP.        Jisela Merlino B. Bernette Mayers, MD 04/10/12 1944

## 2012-04-10 NOTE — Discharge Summary (Signed)
Physician Discharge Summary  Patient ID: [name] MRN: [id number] DOB/AGE: 09/28/1940 72 y.o.  Admit date: 04/10/2012 Discharge date: 04/12/2012  Admission Diagnoses: Chest pain rule-out  Discharge Diagnoses:  Principal Problem:   Chest pain, unspecified Active Problems:   Essential hypertension, benign   Other and unspecified hyperlipidemia   Discogenic low back pain   Discharged Condition: good  Hospital Course: [name] is a 72 y.o. male with a history of HTN, hyperlipidemia, TIA, tobacco abuse, and anxiety who was admitted for chest pain rule-out and BP regulation.   1. Chest pain, unspecified - EKG and chest xray in the ED was normal, as was POCT trop and troponin I x 3. Chest pain resolved after aspirin administration in the ED and did not return. His blood pressure was elevated (180's/ 100's) and he was bradycardic to 50s on admission. Noncompliance with medications is more than likely the issue with his BP elevation. HEART score is 4 (2 age, 2 risk factors). Nature and duration of pain with negative workup made cardiac chest pain unlikely. PE was considered but low pre-test probability even with recent prostate cancer diagnosis. MSK pain was thought to be most likely given location at glenohumeral joint. Pt given tylenol and morphine prn, changed to oxycodone IR upon request and discharged on no medication. Risk stratification labs showed relatively benign LDL 112 but with calculated cardiovascular risk of 40%, started high-dose statin. A1c pending. Encouraged follow-up with PCP this or early next week.  2. Hypertension - Patient reported only taking either atenolol or norvasc daily at home, and BP on admission was elevated to max 186/110. Pt given prn hydralazine x 1 for this BP. Continued norvasc 5mg  and atenolol, but then changed to norvasc 10mg  and started HCTZ 25mg  daily, discontinuing atenolol. F/u closely as outpatient recommended.   3. Hyperlipidemia -  Risk  stratification labs with cholesterol 192, TG 150, LDL 117. CHD risk 40% and h/o TIA, making high-dose statin therapy beneficial. Started atorvastatin 40mg  daily. Also started daily low-dose aspirin.   4. Discogenic low back pain - Pt complained of low back pain radiating into bilateral posterior hamstrings. Denied bowel/bladder changes or weakness. Gave 1 dose oxycodone IR 5mg , then recommended close PCP f/u to decide on physical therapy vs vs sports medicine vs orthopedic referral.   5. Anxiety- Continued home dose xanax.  6. Gross hematuria - Noted on UA with large Hgb and too-numerous-to-count RBCs with no protein, and painless per pt. DDx includes benign transient hematuria vs malignancy vs glomerular disease (less likely with normal protein) vs UTI. Pt already being followed by urology as outpatient and asymptomatic. Next step would be cystoscopy. Recommended patient f/u with urology for evaluation. *Reconsider aspirin if hematuria persists or worsens. Currently, benefit outweighs risk with 40% CHD risk.*  Consults: None  Significant Diagnostic Studies:   Labs and Imaging   Recent Labs  Lab  04/10/12 1807   WBC  6.8   HGB  14.8   HCT  41.5   PLT  139*     Recent Labs  Lab  04/10/12 1809   NA  139   K  3.9   CL  107   CO2  23   BUN  18   CREATININE  1.57*   GLUCOSE  110*   CALCIUM  9.5       Recent Labs Lab 04/10/12 1809 04/10/12 2309 04/11/12 0459 04/11/12 0913  TROPONINI <0.30 <0.30 <0.30 <0.30   Cholesterol 192  TG 150 (  high)  LDL 117 (high)  Hemoglobin A1c 5.7 UA: large hemoglobin, TNTC RBC, rare bacteria  Chest x-ray 4/8:  IMPRESSION:  Low volume exam without superimposed acute process.  EKG with no acute abnormality  Discharge Exam: Temp: [97.6 F (36.4 C)-97.8 F (36.6 C)] 97.8 F (36.6 C) (04/09 0648)  Pulse Rate: [58-73] 64 (04/09 0648)  Resp: [13-33] 18 (04/09 0648)  BP: (126-186)/(73-110) 159/90 mmHg (04/09 0648)  SpO2: [93 %-100 %] 98 %  (04/09 0648)  Weight: [224 lb 6.4 oz (101.787 kg)-315 lb (142.883 kg)] 224 lb 6.4 oz (101.787 kg) (04/08 2246)  General: NAD, lying in bed  HEENT: sclera clear, EOMI, supple neck  CV: RRR, no murmurs, rubs, or gallops, 2+ bilateral DP pulse  Pulm: CTAB with no increased work of breathing, no wheezes or crackles  Abd: Soft, nontender, mildly distended, NABS  Ext: No lower extremity edema; ankles tender to palpation bilaterally; no calf tenderness, asymmetry, or cord.  Neuro: Alert, normal speech, no focal deficits  Back: No point tenderness along spine   Disposition: 01-Home or Self Care     Medication List    STOP taking these medications       atenolol 50 MG tablet  Commonly known as:  TENORMIN      TAKE these medications       ALPRAZolam 0.5 MG tablet  Commonly known as:  XANAX  Take 0.5 mg by mouth daily as needed. Anxiety/sleep     amLODipine 10 MG tablet  Commonly known as:  NORVASC  Take 1 tablet (10 mg total) by mouth daily.     aspirin 81 MG EC tablet  Take 1 tablet (81 mg total) by mouth daily.     atorvastatin 40 MG tablet  Commonly known as:  LIPITOR  Take 1 tablet (40 mg total) by mouth daily at 6 PM.     hydrochlorothiazide 25 MG tablet  Commonly known as:  HYDRODIURIL  Take 1 tablet (25 mg total) by mouth daily.     loratadine 10 MG tablet  Commonly known as:  CLARITIN  Take 1 tablet (10 mg total) by mouth daily.     potassium citrate 10 MEQ (1080 MG) SR tablet  Commonly known as:  UROCIT-K  Take 1 tablet (10 mEq total) by mouth 3 (three) times daily with meals.     SIMBRINZA 1-0.2 % Susp  Generic drug:  Brinzolamide-Brimonidine  Place 1 drop into both eyes 3 (three) times daily.     Travoprost (BAK Free) 0.004 % Soln ophthalmic solution  Commonly known as:  TRAVATAN  Place 1 drop into both eyes at bedtime.     vitamin C 500 MG tablet  Commonly known as:  ASCORBIC ACID  Take 500 mg by mouth daily.       Follow-up Information   Follow  up with [facility], MD. Schedule an appointment as soon as possible for a visit in 2 days.   Contact information:   [address] [place] [address]       Follow up with your urologist. (As scheduled)      Follow-up issues: - F/u BP along with adherence to medication regimen - F/u hematuria - urology to evaluate need for cystoscopy - F/u back pain - Consider PT/Ortho/Sports medicine referral  Signed: Simone Curia MD, PGY-1 04/12/2012, 2:44 AM Family Practice Teaching Service Pager [phone]

## 2012-04-10 NOTE — H&P (Signed)
[name] is an 72 y.o. male.   PCP: Billee Cashing Chief Complaint: Chest Pain  HPI:  [name]  ia a 72 y.o. male with a history of hypertension, dyslipidemia, TIA and anxiety that presented to the ED this evening for chest pain. He reports that his pain  has occurred for 2 weeks intermittently on his  left side and is dull achy pain with no radiation. He describes the pain associated with a audible wheeze and reproducible with deep inspiration. In addition, he admits to dizziness when walking no two occassions today.  He denies associated nausea, vomiting and diaphoresis. He drove himself into the ED this evening after his wife encouraged him to be seen. This evening the pain started when he was lying down. He does not recall hurting his arm or straining himself over the last few weeks.    He describes pain in his legs that are chronic from his back injury, in which he wears a back brace for currently.  He feels occasional sharp stabbing pains in his abdominal area, that he is unsure if it is his kidney stones or something else. He recently underwent a biopsy of his prostate in which they found cancer cells. He is followed by Dr. Retta Diones (Urology). He admits to not taking his BP meds as prescribed and will only take one dose of two daily "if he can get away with it." He denies nausea, vomit, diarrhea or acute visual changes. He admits to more frequent headaches, including today. He does have a history of glaucoma. He noticed his right foot to be swollen, on the top, two days ago. He does not recall injuring his foot and it is not tender. He had a BM today, but does not report a good BM in a few days.   Past Medical History  Diagnosis Date  . Herniated disc   . Hypertension   . Renal calculi BILATERAL   . Chronic back pain   . History of TIA (transient ischemic attack) 2010--  NO RESIDUAL    NONE SINCE  . Dyslipidemia   . Bilateral flank pain     DUE TO KIDNEY STONES  . Weakness  of both legs   . Eczema   . History of kidney stones   . Arthritis   . Frequency of urination   . Urgency of urination   . Nocturia   . Hematuria   . Anxiety   . Glaucoma PT STATES RX RAN OUT  . Glaucoma   . Prostate cancer 03/2012    Past Surgical History  Procedure Laterality Date  . Percutaneous nephrolithotomy  12-10-2007  &  11-07-2005    RIGHT RENAL CALCULI  . Cysto/ bilateral retrograde pyelogram/ left ureterscopic stone extraction  11-14-2005    LEFT URETERAL CALCULUS  . Cysto/ bilateral ureteral stents  11-06-2005  . Right ureteroscopic stone extraction  05-29-2003  . Right ureteral stent placement  02-10-2003  . Repair left inguinal hernia w/ mesh  12-23-2002  . Internal urethrotomy/ turp  12-23-2002    BPH W/ STRICTURE  . Cysto/ left retrograde pyelogram/  left stent placment  05-21-2011  . Extracorporeal shock wave lithotripsy    . Transthoracic echocardiogram  12-19-2008    LVSF NORMAL/ EF 55-60%/ GRADE I DIASTOLIC DYSFUNCTION/   . Cystoscopy w/ ureteral stent removal  06/15/2011    Procedure: CYSTOSCOPY WITH STENT REMOVAL;  Surgeon: Marcine Matar, MD;  Location: [facility];  Service: Urology;  Laterality: Left;  .  Ureteroscopy  06/15/2011    Procedure: URETEROSCOPY;  Surgeon: Marcine Matar, MD;  Location: [facility];  Service: Urology;  Laterality: Bilateral;  . Biliary stent placement  06/15/2011    Procedure: BILIARY STENT PLACEMENT;  Surgeon: Marcine Matar, MD;  [facility];  Service: Urology;  Laterality: Bilateral;    No family history on file. Social History:  reports that he quit smoking about 21 years ago. His smoking use included Cigarettes. He smoked 0.00 packs per day for 40 years. He has never used smokeless tobacco. He reports that he does not drink alcohol or use illicit drugs.  Allergies:  Allergies  Allergen Reactions  . Tape Other (See Comments)    Redness and blisters/paper  tape-not latex  . Sulfa Antibiotics Hives     (Not in a hospital admission)  Results for orders placed during the hospital encounter of 04/10/12 (from the past 48 hour(s))  CBC     Status: Abnormal   Collection Time    04/10/12  6:07 PM      Result Value Range   WBC 6.8  4.0 - 10.5 K/uL   RBC 4.39  4.22 - 5.81 MIL/uL   Hemoglobin 14.8  13.0 - 17.0 g/dL   HCT 69.6  29.5 - 28.4 %   MCV 94.5  78.0 - 100.0 fL   MCH 33.7  26.0 - 34.0 pg   MCHC 35.7  30.0 - 36.0 g/dL   RDW 13.2  44.0 - 10.2 %   Platelets 139 (*) 150 - 400 K/uL  PRO B NATRIURETIC PEPTIDE     Status: Abnormal   Collection Time    04/10/12  6:07 PM      Result Value Range   Pro B Natriuretic peptide (BNP) 401.5 (*) 0 - 125 pg/mL  COMPREHENSIVE METABOLIC PANEL     Status: Abnormal   Collection Time    04/10/12  6:09 PM      Result Value Range   Sodium 139  135 - 145 mEq/L   Potassium 3.9  3.5 - 5.1 mEq/L   Chloride 107  96 - 112 mEq/L   CO2 23  19 - 32 mEq/L   Glucose, Bld 110 (*) 70 - 99 mg/dL   BUN 18  6 - 23 mg/dL   Creatinine, Ser 7.25 (*) 0.50 - 1.35 mg/dL   Calcium 9.5  8.4 - 36.6 mg/dL   Total Protein 6.7  6.0 - 8.3 g/dL   Albumin 3.6  3.5 - 5.2 g/dL   AST 35  0 - 37 U/L   ALT 26  0 - 53 U/L   Alkaline Phosphatase 66  39 - 117 U/L   Total Bilirubin 0.4  0.3 - 1.2 mg/dL   GFR calc non Af Amer 43 (*) >90 mL/min   GFR calc Af Amer 49 (*) >90 mL/min   Comment:            The eGFR has been calculated     using the CKD EPI equation.     This calculation has not been     validated in all clinical     situations.     eGFR's persistently     <90 mL/min signify     possible Chronic Kidney Disease.  TROPONIN I     Status: None   Collection Time    04/10/12  6:09 PM      Result Value Range   Troponin I <0.30  <0.30 ng/mL  Comment:            Due to the release kinetics of cTnI,     a negative result within the first hours     of the onset of symptoms does not rule out     myocardial infarction with  certainty.     If myocardial infarction is still suspected,     repeat the test at appropriate intervals.  URINALYSIS, ROUTINE W REFLEX MICROSCOPIC     Status: Abnormal   Collection Time    04/10/12  6:27 PM      Result Value Range   Color, Urine YELLOW  YELLOW   APPearance HAZY (*) CLEAR   Specific Gravity, Urine 1.019  1.005 - 1.030   pH 5.5  5.0 - 8.0   Glucose, UA NEGATIVE  NEGATIVE mg/dL   Hgb urine dipstick LARGE (*) NEGATIVE   Bilirubin Urine NEGATIVE  NEGATIVE   Ketones, ur NEGATIVE  NEGATIVE mg/dL   Protein, ur NEGATIVE  NEGATIVE mg/dL   Urobilinogen, UA 0.2  0.0 - 1.0 mg/dL   Nitrite NEGATIVE  NEGATIVE   Leukocytes, UA NEGATIVE  NEGATIVE  URINE MICROSCOPIC-ADD ON     Status: None   Collection Time    04/10/12  6:27 PM      Result Value Range   Squamous Epithelial / LPF RARE  RARE   WBC, UA 0-2  <3 WBC/hpf   RBC / HPF TOO NUMEROUS TO COUNT  <3 RBC/hpf   Bacteria, UA RARE  RARE   Dg Chest 2 View  04/10/2012  *RADIOLOGY REPORT*  Clinical Data: Chest pain shortness of breath  CHEST - 2 VIEW  Comparison: 05/21/2011  Findings: Low lung volumes with elevated hemidiaphragms.  Normal heart size and slight vascular prominence.  No definite focal airspace process, collapse, consolidation, edema, effusion or pneumothorax.  Trachea midline.  IMPRESSION: Low volume exam without superimposed acute process.   Original Report Authenticated By: Judie Petit. Shick, M.D.     ROS As per HPI    Blood pressure 165/95, pulse 60, temperature 97.8 F (36.6 C), temperature source Oral, resp. rate 20, height 6' (1.829 m), weight 315 lb (142.883 kg), SpO2 98.00%. Physical Exam  Gen: Alert. Laying in bed. NAD. Pleasant and cooperative.  HENT: Atraumatic. Normocephalic. Bilateral eyes without drainage, icterus or injections. PERLA. EOMi. Pupils small, difficult ophthalmoscope exam, could not well visualize behind the eye. Arcus senilis present bilaterally. Nares patent bilaterally without drainage. Poor  dentition. Throat  Without erythema or exudates.  Neck: Supple. No LAD. No JVD. CV: bradycardic. RR. No murmur noted.Distant heart sounds. Chest: CTAB. Mild rhonchi left side. No wheeze or rales. Mild increase in WOB with speaking. Abd: Obese. Soft. NT. Mildly distended. BS present. No organomegaly appreciated.  EXT: Normal ROM. No deformity. No erythema. Mild edema anterior and lateral right foot. Non-tender. Pulses good and equal bilaterally. Neuro/MSK: 5/5 strength bilaterally. Normal gait and balance. PERLA. Grossly intact with no focal deficits.   Assessment/Plan TYSEN ROESLER is a 72 y.o. male with a history of HTN, hyperlipidemia, TIA and anxiety with chest pain. He was a smoker for 30-40 years, quit 12 years ago, smoked about 2 packs a day. EKG in the ED was normal, as was POCT trop. He currently is no pain in his chest, but complains about leg pain. His blood pressure is elevated (180's/ 100's) and he is bradycardic. Noncompliance with medications is more than likely the issue with his BP elevation. Will admit him overnight for  ACS rule out and BP regulation.   1. Chest pain:  A: EKG normal, POCT trop I negative. Currently in no chest pain. HEART score is 4 (2 age, 2 risk factors). Cardiac chest pain is possible, but given nature of pain and duration of pain w/o elevated trop, it unlikely. Considered PE, but low pre-test probability even if a point is given for cancer treatment (recent dx of prostate cancer). MSK pain is likely given location of pain at glenohumeral joint although pain is not reproducible.  P: -admit for ob for chest pain rule out.  - Tylenol and morphine for pain.  - Repeat TROP now and in 6 hours. - Lipids and A1c pending   2. HTN: - Hydralazine x1 for elevated SBP 180 - Continued amlodipine and atenolol for now. However, will revisit this is AM. Patient may benefit from vasodilator as he gives history of what sound like symptomatic bradycardia in the recent past.     3. Dyslipidemia:  - currently not on medication. Will obtain FLP and determine if statin indicated, i suspect it will be given history of TIA.   4. Anxiety: - Home dose xanax  5. FENGI: - Saline locked. - Regular diet - Zofran - tylenol   Proph: Lovenox 40 Code: Full DISPO: pending negative work up for ACS and stabilization of BP.   Kuneff, Renee 04/10/2012, 9:11 PM  I examined the patient with Dr. Claiborne Billings. I have reviewed the note, made necessary revisions and agree with above.  Eulala Newcombe 04/10/12, 11:31 PM

## 2012-04-10 NOTE — ED Notes (Signed)
Pt with wheezing x 2 wheezing and intermittent L sided chest pain that radiates to back x 1 wee.  Also complains that his abd is swelling.

## 2012-04-11 DIAGNOSIS — R079 Chest pain, unspecified: Secondary | ICD-10-CM

## 2012-04-11 DIAGNOSIS — I10 Essential (primary) hypertension: Secondary | ICD-10-CM

## 2012-04-11 DIAGNOSIS — M549 Dorsalgia, unspecified: Secondary | ICD-10-CM

## 2012-04-11 LAB — HEMOGLOBIN A1C: Mean Plasma Glucose: 117 mg/dL — ABNORMAL HIGH (ref <117)

## 2012-04-11 LAB — LIPID PANEL
LDL Cholesterol: 117 mg/dL — ABNORMAL HIGH (ref 0–99)
Total CHOL/HDL Ratio: 4.3 RATIO
Triglycerides: 150 mg/dL — ABNORMAL HIGH (ref <150)
VLDL: 30 mg/dL (ref 0–40)

## 2012-04-11 MED ORDER — AMLODIPINE BESYLATE 5 MG PO TABS
5.0000 mg | ORAL_TABLET | Freq: Once | ORAL | Status: AC
  Administered 2012-04-11: 5 mg via ORAL
  Filled 2012-04-11: qty 1

## 2012-04-11 MED ORDER — LORATADINE 10 MG PO TABS: 10.0000 mg | ORAL_TABLET | Freq: Every day | ORAL | Status: DC

## 2012-04-11 MED ORDER — OXYCODONE HCL 5 MG PO TABS
5.0000 mg | ORAL_TABLET | Freq: Four times a day (QID) | ORAL | Status: DC | PRN
  Administered 2012-04-11: 5 mg via ORAL
  Filled 2012-04-11: qty 1

## 2012-04-11 MED ORDER — AMLODIPINE BESYLATE 10 MG PO TABS: 10.0000 mg | ORAL_TABLET | Freq: Every day | ORAL | Status: DC

## 2012-04-11 MED ORDER — ATORVASTATIN CALCIUM 40 MG PO TABS
40.0000 mg | ORAL_TABLET | Freq: Every day | ORAL | Status: DC
  Filled 2012-04-11: qty 1

## 2012-04-11 MED ORDER — LORATADINE 10 MG PO TABS
10.0000 mg | ORAL_TABLET | Freq: Every day | ORAL | Status: DC
  Administered 2012-04-11: 10 mg via ORAL
  Filled 2012-04-11: qty 1

## 2012-04-11 MED ORDER — ASPIRIN 81 MG PO TBEC: 81.0000 mg | DELAYED_RELEASE_TABLET | Freq: Every day | ORAL | Status: DC

## 2012-04-11 MED ORDER — HYDROCHLOROTHIAZIDE 25 MG PO TABS: 25.0000 mg | ORAL_TABLET | Freq: Every day | ORAL | Status: DC

## 2012-04-11 MED ORDER — HYDROCHLOROTHIAZIDE 25 MG PO TABS
25.0000 mg | ORAL_TABLET | Freq: Every day | ORAL | Status: DC
  Administered 2012-04-11: 25 mg via ORAL
  Filled 2012-04-11: qty 1

## 2012-04-11 MED ORDER — SALINE SPRAY 0.65 % NA SOLN
1.0000 | NASAL | Status: DC | PRN
  Filled 2012-04-11: qty 44

## 2012-04-11 MED ORDER — ATORVASTATIN CALCIUM 40 MG PO TABS: 40.0000 mg | ORAL_TABLET | Freq: Every day | ORAL | Status: DC

## 2012-04-11 NOTE — Progress Notes (Signed)
Utilization review completed. Numa Schroeter, RN, BSN. 

## 2012-04-11 NOTE — Progress Notes (Signed)
[facility] Daily Intern Progress Note  Subjective: [name] complains of left foot swelling, low back pain radiating to the backs of his legs, but no bowel or bladder symptoms. Has been eating and walking per baseline. Feels mild occasional SOB but denies chest pain. Requested oral pain medication this morning stating he is on norco at home.  I have reviewed the patient's medications. Received PRN tylenol 11pm, xanax 11pm, morphine 2mg  5am, and oxycodone IR 5mg  7 am.  Objective Temp:  [97.6 F (36.4 C)-97.8 F (36.6 C)] 97.8 F (36.6 C) (04/09 0648) Pulse Rate:  [58-73] 64 (04/09 0648) Resp:  [13-33] 18 (04/09 0648) BP: (126-186)/(73-110) 159/90 mmHg (04/09 0648) SpO2:  [93 %-100 %] 98 % (04/09 0648) Weight:  [224 lb 6.4 oz (101.787 kg)-315 lb (142.883 kg)] 224 lb 6.4 oz (101.787 kg) (04/08 2246)  No intake or output data in the 24 hours ending 04/11/12 0903   General: NAD, lying in bed HEENT: sclera clear, EOMI, supple neck CV: RRR, no murmurs, rubs, or gallops, 2+ bilateral DP pulse Pulm: CTAB with no increased work of breathing, no wheezes or crackles Abd: Soft, nontender, mildly distended, NABS Ext: No lower extremity edema; ankles tender to palpation bilaterally; no calf tenderness, asymmetry, or cord. Neuro: Alert, normal speech, no focal deficits Back: No point tenderness along spine  Labs and Imaging  Recent Labs Lab 04/10/12 1807  WBC 6.8  HGB 14.8  HCT 41.5  PLT 139*     Recent Labs Lab 04/10/12 1809  NA 139  K 3.9  CL 107  CO2 23  BUN 18  CREATININE 1.57*  GLUCOSE 110*  CALCIUM 9.5   Troponin I neg x 3 Cholesterol 192 TG 150 (high) LDL 117 (high) UA: large hemoglobin, TNTC RBC, rare bacteria   Assessment and Plan- [name] is a 72 y.o. male with a history of HTN, hyperlipidemia, TIA, tobacco abuse, and anxiety who was admitted for chest pain rule-out and BP regulation. EKG in the ED was normal, as was POCT trop and now troponin I x 3. He  currently is no pain in his chest. His blood pressure was elevated (180's/ 100's) and he was bradycardic to 50s on admission. Noncompliance with medications is more than likely the issue with his BP elevation.   1. Chest pain:  A: EKG normal, troponin neg x 3. Currently in no chest pain. HEART score is 4 (2 age, 2 risk factors). Cardiac chest pain is possible, but given nature of pain and duration of pain w/o elevated trop, it is unlikely. Considered PE, but low pre-test probability even considering recent dx prostate cancer. MSK pain is likely given location of pain at glenohumeral joint although pain is not reproducible.  P:  - Tylenol and oxycodone IR for pain (pt preferred to morphine).  - A1c pending   2. HTN: Received hydralazine x 1 for SBP 180 - Continued amlodipine 5mg  and atenolol for now.  - Given continued elevated pressures, increasing norvasc to 10mg  daily, starting HCTZ, and if still uncontrolled as outpatient, can consider ACE and watch Cr carefully, or clonidine. Continue atenolol 50mg .  3. Dyslipidemia:  - currently not on medication. CVD risk 40%. Also with h/o TIA, making high-dose statin therapy beneficial (careful with amlodipine); will start atorvastatin 40mg  daily - Also consider continuing daily low-dose aspirin  4. Anxiety:  - Home dose xanax   5. Gross hematuria - seen on UA with large Hgb and TNTC RBC with no protein; Painless. DDx includes  benign transient hematuria, malignancy, glomerular disease (less likely with no protein), UTI - Pt already being followed by urology outpatient, and next step would be cystoscopy - f/u outpatient - Reconsider aspirin if hematuria continues or worsens; currently, benefit outweighs risk with 40% CVD risk  6. FENGI:  - Saline locked.  - Regular diet  - Zofran   PPX: Lovenox 40  Code: Full  DISPO: Today, with ACS ruled out   Simone Curia Pager: [phone] 04/11/2012, 9:03 AM

## 2012-04-11 NOTE — Progress Notes (Signed)
Pt discharged to home per MD order. Pt received and reviewed all discharge instructions and medication information including follow-up appointments and prescriptions. Pt verbalized understanding.  Pt alert and oriented at discharged with no complaints of pain. Pt escorted to private vehicle via wheelchair by nurse tech. Efraim Kaufmann

## 2012-04-11 NOTE — H&P (Signed)
I examined this patient and discussed the care plan with Dr Armen Pickup and the [facility] team and agree with assessment and plan as documented in the progress note above. Also consider a cervical radiculopathy as a source for his pain, though I could not reproduce it on Sp[place]ings test. I believe he can be discharged today since Troponins are negative.

## 2012-04-11 NOTE — Progress Notes (Signed)
Discussed in rounds.  Seen earlier today by attending Dr. Sheffield Slider.  OK to DC today since he has ruled out for MI.

## 2012-04-12 DIAGNOSIS — E785 Hyperlipidemia, unspecified: Secondary | ICD-10-CM

## 2012-04-12 DIAGNOSIS — M5136 Other intervertebral disc degeneration, lumbar region: Secondary | ICD-10-CM

## 2012-04-12 DIAGNOSIS — R079 Chest pain, unspecified: Principal | ICD-10-CM

## 2012-04-12 NOTE — Discharge Summary (Signed)
Seen and examined.  Correction.  Actually DCed on 4/9, not 4/10.  Otherwise agree with Dr. Karie Schwalbe. Management and documentation.

## 2012-04-19 ENCOUNTER — Other Ambulatory Visit: Payer: Self-pay | Admitting: Urology

## 2012-04-19 ENCOUNTER — Ambulatory Visit (HOSPITAL_COMMUNITY)
Admission: RE | Admit: 2012-04-19 | Discharge: 2012-04-19 | Disposition: A | Discharge status: Home / Self Care | Payer: Medicare Other | Source: Ambulatory Visit | Attending: Urology | Admitting: Urology

## 2012-04-19 DIAGNOSIS — R937 Abnormal findings on diagnostic imaging of other parts of musculoskeletal system: Secondary | ICD-10-CM | POA: Insufficient documentation

## 2012-04-19 DIAGNOSIS — C61 Malignant neoplasm of prostate: Secondary | ICD-10-CM

## 2012-05-01 ENCOUNTER — Encounter: Payer: Self-pay | Admitting: Oncology

## 2012-05-01 DIAGNOSIS — C61 Malignant neoplasm of prostate: Secondary | ICD-10-CM

## 2012-05-01 NOTE — Progress Notes (Signed)
GU Location of Tumor / Histology: Prostate Cancer, clinically both lobes  If Prostate Cancer, Gleason Score is (3 + 3 in 8 biopsies, 3+4 in once biopsy) and PSA is (25.77) and prostate volume is 23 cc.  Patient presented with a psa of 25.77 and nodular prostate exam.  Biopsies of prostate (if applicable) revealed: adenocarcinoma of the prostate  Past/Anticipated interventions by urology, if any: prostate biopsy, possible androgen ablation  Past/Anticipated interventions by medical oncology, if any:  None  Weight changes, if any: States he has gained 10 lbs in the past month   Bowel/Bladder complaints, if any:  He c/o incomplete emptying and note more frequency.  Weak stream.  IPSS score is 20  Nausea/Vomiting, if any:  States nausea "every now and then"  Pain issues, if any:  C/o intermittent "stabbing pain" in the low pelvic region and in his "private part."  SAFETY ISSUES:  Prior radiation? No  Pacemaker/ICD? NO    Possible current pregnancy? No  Is the patient on methotrexate?  NO  Current Complaints / other details:

## 2012-05-02 ENCOUNTER — Encounter: Payer: Self-pay | Admitting: Radiation Oncology

## 2012-05-02 ENCOUNTER — Ambulatory Visit
Admission: RE | Admit: 2012-05-02 | Discharge: 2012-05-02 | Disposition: A | Discharge status: Home / Self Care | Payer: Medicare Other | Source: Ambulatory Visit | Attending: Radiation Oncology | Admitting: Radiation Oncology

## 2012-05-02 VITALS — BP 136/87 | HR 104 | Temp 97.7°F | Ht 72.0 in | Wt 224.6 lb

## 2012-05-02 DIAGNOSIS — C61 Malignant neoplasm of prostate: Secondary | ICD-10-CM

## 2012-05-02 DIAGNOSIS — N139 Obstructive and reflux uropathy, unspecified: Secondary | ICD-10-CM | POA: Insufficient documentation

## 2012-05-02 NOTE — Progress Notes (Signed)
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 male who is seen today for the courtesy of Dr. Retta Diones for consideration of radiation therapy in the management of his stageT2b high-risk adenocarcinoma prostate. His PSA in 2012 was 5.4. A followup PSA on 01/11/2012 was 25.77. He underwent ultrasound-guided biopsies by Dr. Retta Diones on 02/24/2012. He is found to have since 7 (3+4) involving 70% of one core from the right lateral base along with extensive Gleason 6 (3+3) involving 60% of one core from the right base, 90% of one core from right lateral mid gland, 40% of one core from the right mid gland, 40% of one core from the right lateral apex, less than 5% of one core from the left lateral base, 20% of one core from the left lateral mid gland, 20% of one core from the left mid gland and 20% of one core from the left lateral apex. Thus 9 of 12 biopsies contained carcinoma. His gland volume was 23 cc. His staging workup included a bone scan on 03/13/2012 a CT scan of the abdomen/pelvis on 03/13/2012 which was without evidence for metastatic disease. He does have moderate to severe obstructive urinary symptomatology with an I PSS score of 20. He also is a has intermittent constipation. He claims to be potent.  PREVIOUS RADIATION THERAPY: No   PAST MEDICAL HISTORY:  has a past medical history of Herniated disc; Hypertension; Renal calculi (BILATERAL ); Chronic back pain; History of TIA (transient ischemic attack) (2010--  NO RESIDUAL); Dyslipidemia; Bilateral flank pain; Weakness of both legs; Eczema; History of kidney stones; Arthritis; Frequency of  urination; Urgency of urination; Nocturia; Hematuria; Anxiety; Glaucoma (PT STATES RX RAN OUT); Glaucoma; Prostate cancer (03/2012); Elevated PSA (01/11/2012); and History of shingles.     PAST SURGICAL HISTORY:  Past Surgical History  Procedure Laterality Date  . Percutaneous nephrolithotomy  12-10-2007  &  11-07-2005    RIGHT RENAL CALCULI  . Cysto/ bilateral retrograde pyelogram/ left ureterscopic stone extraction  11-14-2005    LEFT URETERAL CALCULUS  . Cysto/ bilateral ureteral stents  11-06-2005  . Right ureteroscopic stone extraction  05-29-2003  . Right ureteral stent placement  02-10-2003  . Repair left inguinal hernia w/ mesh  12-23-2002  . Internal urethrotomy/ turp  12-23-2002    BPH W/ STRICTURE  . Cysto/ left retrograde pyelogram/  left stent placment  05-21-2011  . Extracorporeal shock wave lithotripsy    . Transthoracic echocardiogram  12-19-2008    LVSF NORMAL/ EF 55-60%/ GRADE I DIASTOLIC DYSFUNCTION/   . Cystoscopy w/ ureteral stent removal  06/15/2011    Procedure: CYSTOSCOPY WITH STENT REMOVAL;  Surgeon: Marcine Matar, MD;  Location: [facility];  Service: Urology;  Laterality: Left;  . Ureteroscopy  06/15/2011    Procedure: URETEROSCOPY;  Surgeon: Marcine Matar, MD;  Location: [facility];  Service: Urology;  Laterality: Bilateral;  . Biliary stent placement  06/15/2011    Procedure: BILIARY STENT PLACEMENT;  Surgeon: Marcine Matar, MD;  Location: [facility];  Service: Urology;  Laterality: Bilateral;  . Transurethral resection of bladder  2004  FAMILY HISTORY: His father died following a stroke at 44. His mother died following a heart attack at 27. An older brother was diagnosed with prostate cancer at age 37 and was treated by seed implantation.  SOCIAL HISTORY:  reports that he quit smoking about 21 years ago. His smoking use included Cigarettes. He smoked 0.00 packs per day for 40 years. He has never used  smokeless tobacco. He reports that he does not drink alcohol or use illicit drugs. Married, 6 children. Retired from the [employer].   ALLERGIES: Tape; Doxycycline hyclate; and Sulfa antibiotics   MEDICATIONS:  Current Outpatient Prescriptions  Medication Sig Dispense Refill  . ALPRAZolam (XANAX) 0.5 MG tablet Take 0.5 mg by mouth daily as needed. Anxiety/sleep      . amLODipine (NORVASC) 10 MG tablet Take 1 tablet (10 mg total) by mouth daily.  30 tablet  1  . aspirin EC 81 MG EC tablet Take 1 tablet (81 mg total) by mouth daily.  30 tablet  1  . atorvastatin (LIPITOR) 40 MG tablet Take 1 tablet (40 mg total) by mouth daily at 6 PM.  30 tablet  1  . Brinzolamide-Brimonidine (SIMBRINZA) 1-0.2 % SUSP Place 1 drop into both eyes 3 (three) times daily.      . hydrochlorothiazide (HYDRODIURIL) 25 MG tablet Take 1 tablet (25 mg total) by mouth daily.  30 tablet  1  . HYDROcodone-acetaminophen (NORCO) 7.5-325 MG per tablet       . loratadine (CLARITIN) 10 MG tablet Take 1 tablet (10 mg total) by mouth daily.  30 tablet  1  . potassium citrate (UROCIT-K) 10 MEQ (1080 MG) SR tablet Take 1 tablet (10 mEq total) by mouth 3 (three) times daily with meals.      . Travoprost, BAK Free, (TRAVATAN) 0.004 % SOLN ophthalmic solution Place 1 drop into both eyes at bedtime.       . vitamin C (ASCORBIC ACID) 500 MG tablet Take 500 mg by mouth daily.       No current facility-administered medications for this encounter.     REVIEW OF SYSTEMS:  Pertinent items are noted in HPI.    PHYSICAL EXAM:  height is 6' (1.829 m) and weight is 224 lb 9.6 oz (101.878 kg). His temperature is 97.7 F (36.5 C). His blood pressure is 136/87 and his pulse is 104.   Alert and oriented 72 year old African American male appearing his stated age. Head and neck examination: Grossly unremarkable. Nodes: Without palpable cervical, or supraclavicular lymphadenopathy. Chest: Lungs clear. Back: Without spinal  or CVA tenderness. Heart: Regular in rhythm. Abdomen: Without hepatomegaly. Genitalia: Unremarkable to inspection. Rectal: The prostate gland is normal in size and there is raised induration noted along the right base. There is no palpable periprostatic tumor extension. Extremities: Without edema. Neurologic examination: Grossly nonfocal.   LABORATORY DATA:  Lab Results  Component Value Date   WBC 6.8 04/10/2012   HGB 14.8 04/10/2012   HCT 41.5 04/10/2012   MCV 94.5 04/10/2012   PLT 139* 04/10/2012   Lab Results  Component Value Date   NA 139 04/10/2012   K 3.9 04/10/2012   CL 107 04/10/2012   CO2 23 04/10/2012   Lab Results  Component Value Date   ALT 26 04/10/2012   AST 35 04/10/2012   ALKPHOS 66 04/10/2012   BILITOT 0.4 04/10/2012   PSA 25.77 from 01/11/2012.   IMPRESSION: StageT2b high-risk adenocarcinoma prostate. I explained to the patient and his wife  that his prognosis is related to his stage, PSA level, and Gleason score. His stage is favorable, his Gleason score of 7 is of intermediate favorability and his PSA of over 20 is unfavorable. Other prognostic factors include PSA doubling time and disease volume which are both unfavorable. He has high-risk disease. Management options include surgery versus close surveillance with or without androgen deprivation therapy, and radiation therapy. Radiation therapy options include 5 weeks of external beam followed by seed implantation boost or 8 weeks of external beam/IMRT. Based on his urinary obstructive symptomatology, he is not a candidate for seed implantation. By default, he is remaining option is 8 weeks of external beam/IMRT. With his high-risk disease, I do recommend 2 years of androgen deprivation therapy. I discussed the potential side effects of androgen deprivation therapy including hot flashes and loss of sex drive. This is of some concern to him, but he would like to maximize his chance for cure. I talked to Dr. Lenoria Chime nurse, and she will get  this scheduled. I discussed the potential acute and late toxicities of radiation therapy. Consent is signed today.   PLAN: I'll have the patient return here for a followup visit in 2 months. In the meantime, I will kindly asked Dr. Retta Diones to placed 3 gold seed markers within the prostate for image guidance during his IMRT. He should start his androgen deprivation therapy by next week.   I spent 60 minutes minutes face to face with the patient and more than 50% of that time was spent in counseling and/or coordination of care.

## 2012-05-02 NOTE — Progress Notes (Signed)
Please see the Nurse Progress Note in the MD Initial Consult Encounter for this patient. 

## 2012-05-02 NOTE — Progress Notes (Signed)
[name] here today for evaluation to start radiation therapy for his adnocarcinoma of the prostate. He is accompanied by his wife.

## 2012-05-02 NOTE — Progress Notes (Signed)
Mr. Sluder reports that he has a herniated disc in his lower neck.

## 2012-05-07 ENCOUNTER — Telehealth: Payer: Self-pay | Admitting: *Deleted

## 2012-05-07 NOTE — Telephone Encounter (Signed)
Called patient to update about appts., lvm for a return cal

## 2012-05-07 NOTE — Telephone Encounter (Signed)
Called patient to inform of gold seed placement on May 22 arrival time - 2:15 pm at Dr. Lenoria Chime Office, lvm for a return call

## 2012-05-08 ENCOUNTER — Encounter: Payer: Self-pay | Admitting: Radiation Oncology

## 2012-05-08 NOTE — Progress Notes (Signed)
CC: Dr. Marcine Matar   Chart note: Mr. [name] left a message stating that he did not want to pursue androgen deprivation therapy and simply wants to have IMRT alone for his high-risk prostate cancer. He is scheduled to have 3 gold seeds placed by Dr. Retta Diones on  May 22. I will speak with Mr. [name] later today and if he is certain about not having androgen deprivation therapy, I will have him return for simulation/treatment planning the week of May 26. He will not need to keep his two-month followup appointment.

## 2012-05-25 ENCOUNTER — Telehealth: Payer: Self-pay | Admitting: *Deleted

## 2012-05-25 NOTE — Telephone Encounter (Signed)
Called patient to remind of appt. For 05-29-12, lvm  For a return call

## 2012-05-29 ENCOUNTER — Encounter: Payer: Self-pay | Admitting: Radiation Oncology

## 2012-05-29 ENCOUNTER — Ambulatory Visit
Admission: RE | Admit: 2012-05-29 | Discharge: 2012-05-29 | Disposition: A | Discharge status: Home / Self Care | Payer: Medicare Other | Source: Ambulatory Visit | Attending: Radiation Oncology | Admitting: Radiation Oncology

## 2012-05-29 DIAGNOSIS — R351 Nocturia: Secondary | ICD-10-CM | POA: Insufficient documentation

## 2012-05-29 DIAGNOSIS — Z510 Encounter for antineoplastic radiation therapy: Secondary | ICD-10-CM | POA: Insufficient documentation

## 2012-05-29 DIAGNOSIS — C61 Malignant neoplasm of prostate: Secondary | ICD-10-CM | POA: Insufficient documentation

## 2012-05-29 NOTE — Progress Notes (Signed)
RO billing information was given to patient.  He will contact me if any further questions.

## 2012-05-29 NOTE — Progress Notes (Signed)
Complex CT simulation/treatment planning note: The patient was taken to the CT simulator. A custom vac lock immobilization device was constructed. A red rubber tube was placed within the rectal vault. He was then catheterized and contrast instilled into the bladder/urethra. His pelvis was scanned. The CT data set was sent to the MIM planning system work on his prostate, seminal vesicles, bladder, and also lymph node PTV. I prescribing 7800 cGy in 40 sessions to his prostate PTV which represents the prostate was 0.8 cm except for 0.5 cm along the rectum. The seminal vesicles receive 5600 cGy in 40 sessions with expansion of 0.5 cm. The lymph node PTV (PTV 56) will receive 5600 cGy in 40 sessions. He'll be treated with IMRT helical Tomotherapy. I requested that he be treated with a company full bladder and undergo daily MV CT setting up to his 3 gold seeds .

## 2012-05-31 ENCOUNTER — Encounter: Payer: Self-pay | Admitting: Radiation Oncology

## 2012-05-31 NOTE — Progress Notes (Signed)
IMRT simulation/treatment planning note: The patient completed IMRT simulation/treatment planning today in the management of his locally advanced cancer the prostate. IMRT was chosen to decrease the risk for both acute and late bladder and rectal toxicity compared to conventional radiation therapy or 3-D conformal radiation therapy. Dose volume histograms were obtained for the prostate, seminal vesicles, and pelvic lymph nodes. Dose volume histograms were also obtained for the avoidance structures including the bladder, rectum, and femoral heads. We met our departmental goals. I'm prescribing 7800 cGy in 40 sessions to his prostate PTV and 5600 cGy in 40 sessions to his seminal vesicles and regional lymph nodes. I requesting daily MV CT setting up to his 3 gold seeds. He is to be treated with a company full bladder.

## 2012-06-06 ENCOUNTER — Other Ambulatory Visit (HOSPITAL_COMMUNITY): Payer: Self-pay | Admitting: Family Medicine

## 2012-06-07 ENCOUNTER — Ambulatory Visit
Admission: RE | Admit: 2012-06-07 | Discharge: 2012-06-07 | Disposition: A | Discharge status: Home / Self Care | Payer: Medicare Other | Source: Ambulatory Visit | Attending: Radiation Oncology | Admitting: Radiation Oncology

## 2012-06-07 DIAGNOSIS — C61 Malignant neoplasm of prostate: Secondary | ICD-10-CM

## 2012-06-07 NOTE — Progress Notes (Signed)
Chart note: The patient begins his IMRT in the management of his carcinoma the prostate. He is being treated on Tomotherapy. He is being treated to 9.2 delivered field widths corresponding to one set of IMRT treatment devices [phone]).

## 2012-06-08 ENCOUNTER — Ambulatory Visit: Payer: Medicare Other

## 2012-06-08 ENCOUNTER — Ambulatory Visit
Admission: RE | Admit: 2012-06-08 | Discharge: 2012-06-08 | Disposition: A | Discharge status: Home / Self Care | Payer: Medicare Other | Source: Ambulatory Visit | Attending: Radiation Oncology | Admitting: Radiation Oncology

## 2012-06-09 ENCOUNTER — Other Ambulatory Visit (HOSPITAL_COMMUNITY): Payer: Self-pay | Admitting: Family Medicine

## 2012-06-11 ENCOUNTER — Ambulatory Visit
Admission: RE | Admit: 2012-06-11 | Discharge: 2012-06-11 | Disposition: A | Discharge status: Home / Self Care | Payer: Medicare Other | Source: Ambulatory Visit | Attending: Radiation Oncology | Admitting: Radiation Oncology

## 2012-06-11 VITALS — BP 140/94 | HR 87 | Temp 97.9°F | Ht 72.0 in | Wt 221.9 lb

## 2012-06-11 DIAGNOSIS — C61 Malignant neoplasm of prostate: Secondary | ICD-10-CM

## 2012-06-11 NOTE — Progress Notes (Signed)
Weekly Management Note:  Site: State/pelvic lymph nodes Current Dose:  585  cGy Projected Dose: 7800  cGy  Narrative: The patient is seen today for routine under treatment assessment. CBCT/MVCT images/port films were reviewed. The chart was reviewed.   Satisfactory bladder filling. No new GU or GI difficulty. He does have some difficulty with constipation.  Physical Examination:  Filed Vitals:   06/11/12 1601  BP: 140/94  Pulse: 87  Temp: 97.9 F (36.6 C)  .  Weight: 221 lb 14.4 oz (100.653 kg). No change.  Impression: Tolerating radiation therapy well.  Plan: Continue radiation therapy as planned.

## 2012-06-11 NOTE — Progress Notes (Signed)
[name] here for weekly under treat visit.  He has had 3/40 fractions to his prostate.  He does have chronic pain in his lower back that he rates at a 3/10.  He does have occasional fatigue.  He does get up 3 times to urinate during the night.  He denies diarrhea, hematuria and dysuria.  He does report that his urinary stream seems to be "flowing more freely than it was before."  He was given the radiation therapy and you book and discussed the possible side effects of radiation including fatigue, hair loss, diarrhea, nausea, skin changes and bladder changes.  He was advised to contact nursing with any questions.

## 2012-06-12 ENCOUNTER — Ambulatory Visit
Admission: RE | Admit: 2012-06-12 | Discharge: 2012-06-12 | Disposition: A | Discharge status: Home / Self Care | Payer: Medicare Other | Source: Ambulatory Visit | Attending: Radiation Oncology | Admitting: Radiation Oncology

## 2012-06-13 ENCOUNTER — Ambulatory Visit
Admission: RE | Admit: 2012-06-13 | Discharge: 2012-06-13 | Disposition: A | Discharge status: Home / Self Care | Payer: Medicare Other | Source: Ambulatory Visit | Attending: Radiation Oncology | Admitting: Radiation Oncology

## 2012-06-14 ENCOUNTER — Ambulatory Visit
Admission: RE | Admit: 2012-06-14 | Discharge: 2012-06-14 | Disposition: A | Discharge status: Home / Self Care | Payer: Medicare Other | Source: Ambulatory Visit | Attending: Radiation Oncology | Admitting: Radiation Oncology

## 2012-06-15 ENCOUNTER — Ambulatory Visit
Admission: RE | Admit: 2012-06-15 | Discharge: 2012-06-15 | Disposition: A | Discharge status: Home / Self Care | Payer: Medicare Other | Source: Ambulatory Visit | Attending: Radiation Oncology | Admitting: Radiation Oncology

## 2012-06-18 ENCOUNTER — Ambulatory Visit
Admission: RE | Admit: 2012-06-18 | Discharge: 2012-06-18 | Disposition: A | Discharge status: Home / Self Care | Payer: Medicare Other | Source: Ambulatory Visit | Attending: Radiation Oncology | Admitting: Radiation Oncology

## 2012-06-18 VITALS — BP 127/80 | HR 92 | Temp 98.0°F | Wt 220.7 lb

## 2012-06-18 DIAGNOSIS — C61 Malignant neoplasm of prostate: Secondary | ICD-10-CM

## 2012-06-18 NOTE — Progress Notes (Signed)
Weekly Management Note:  Site: Prostate/pelvic lymph nodes Current Dose:  1560  cGy Projected Dose: 7800  cGy  Narrative: The patient is seen today for routine under treatment assessment. CBCT/MVCT images/port films were reviewed. The chart was reviewed.   Bladder filling appears to be satisfactory but less than ideal. He feels that he may have slight increase in urinary frequency, no change in bowel habits.  Physical Examination:  Filed Vitals:   06/18/12 0831  BP: 127/80  Pulse: 92  Temp: 98 F (36.7 C)  .  Weight: 220 lb 11.2 oz (100.109 kg). No change.  Impression: Tolerating radiation therapy well. I encouraged the patient to improve his bladder filling.  Plan: Continue radiation therapy as planned.

## 2012-06-18 NOTE — Progress Notes (Signed)
Patient here for weekly assessment of radiation to prostate nodes.Has frequency of urination and occasional  Burning on urination.Has warm sensation over abdomen at times.No changes in bowels, states regular.completed 8 of 40 treatments.Mild fatigue.

## 2012-06-19 ENCOUNTER — Ambulatory Visit
Admission: RE | Admit: 2012-06-19 | Discharge: 2012-06-19 | Disposition: A | Discharge status: Home / Self Care | Payer: Medicare Other | Source: Ambulatory Visit | Attending: Radiation Oncology | Admitting: Radiation Oncology

## 2012-06-20 ENCOUNTER — Ambulatory Visit
Admission: RE | Admit: 2012-06-20 | Discharge: 2012-06-20 | Disposition: A | Discharge status: Home / Self Care | Payer: Medicare Other | Source: Ambulatory Visit | Attending: Radiation Oncology | Admitting: Radiation Oncology

## 2012-06-21 ENCOUNTER — Ambulatory Visit
Admission: RE | Admit: 2012-06-21 | Discharge: 2012-06-21 | Disposition: A | Discharge status: Home / Self Care | Payer: Medicare Other | Source: Ambulatory Visit | Attending: Radiation Oncology | Admitting: Radiation Oncology

## 2012-06-22 ENCOUNTER — Ambulatory Visit
Admission: RE | Admit: 2012-06-22 | Discharge: 2012-06-22 | Disposition: A | Discharge status: Home / Self Care | Payer: Medicare Other | Source: Ambulatory Visit | Attending: Radiation Oncology | Admitting: Radiation Oncology

## 2012-06-25 ENCOUNTER — Ambulatory Visit
Admission: RE | Admit: 2012-06-25 | Discharge: 2012-06-25 | Disposition: A | Discharge status: Home / Self Care | Payer: Medicare Other | Source: Ambulatory Visit | Attending: Radiation Oncology | Admitting: Radiation Oncology

## 2012-06-25 ENCOUNTER — Encounter: Payer: Self-pay | Admitting: Radiation Oncology

## 2012-06-25 VITALS — BP 142/80 | HR 77 | Temp 97.7°F | Resp 20 | Wt 218.3 lb

## 2012-06-25 DIAGNOSIS — C61 Malignant neoplasm of prostate: Secondary | ICD-10-CM

## 2012-06-25 NOTE — Progress Notes (Signed)
Weekly Management Note:  Site: Prostate/pelvic lymph nodes Current Dose:  2535  cGy Projected Dose: 7800  cGy  Narrative: The patient is seen today for routine under treatment assessment. CBCT/MVCT images/port films were reviewed. The chart was reviewed.   Bladder filling satisfactory. He does have some increasing urinary frequency with nocturia x4. No diarrhea, but he does have constipation.  Physical Examination:  Filed Vitals:   06/25/12 0849  BP: 142/80  Pulse: 77  Temp: 97.7 F (36.5 C)  Resp: 20  .  Weight: 218 lb 4.8 oz (99.02 kg). No change.  Impression: Tolerating radiation therapy well. I suggested he take MiraLax for his constipation.  Plan: Continue radiation therapy as planned.

## 2012-06-25 NOTE — Progress Notes (Signed)
weekly rad txs prostate 13/40 completed, patient over the weekend called c/o dysuria and nausea, per Dr.Manning, rxs for pyridium and compazine called or sent Epic to patients pharmacy on [address], patient stated he passed a kidney stone Saturday  eveuning around 5-530pm, stated he started feeling better after that, hasn't picked up rx's as yet,said he would pick them up today or tomorrow , had bowel movement today slight constipated, and no c/o nausea this am, still has dysuria but not as bad as the weekend, no hematuria, drinking water trying to drink more, appetite okay, not taking stool softners as yet,says constipation has been with him a long time, frequency in voiding ,nocturia x4 8:47 AM

## 2012-06-26 ENCOUNTER — Ambulatory Visit
Admission: RE | Admit: 2012-06-26 | Discharge: 2012-06-26 | Disposition: A | Discharge status: Home / Self Care | Payer: Medicare Other | Source: Ambulatory Visit | Attending: Radiation Oncology | Admitting: Radiation Oncology

## 2012-06-27 ENCOUNTER — Ambulatory Visit
Admission: RE | Admit: 2012-06-27 | Discharge: 2012-06-27 | Disposition: A | Discharge status: Home / Self Care | Payer: Medicare Other | Source: Ambulatory Visit | Attending: Radiation Oncology | Admitting: Radiation Oncology

## 2012-06-28 ENCOUNTER — Ambulatory Visit
Admission: RE | Admit: 2012-06-28 | Discharge: 2012-06-28 | Disposition: A | Discharge status: Home / Self Care | Payer: Medicare Other | Source: Ambulatory Visit | Attending: Radiation Oncology | Admitting: Radiation Oncology

## 2012-06-28 ENCOUNTER — Ambulatory Visit: Payer: Medicare Other | Admitting: Radiation Oncology

## 2012-06-28 ENCOUNTER — Ambulatory Visit: Payer: Medicare Other

## 2012-06-29 ENCOUNTER — Ambulatory Visit
Admission: RE | Admit: 2012-06-29 | Discharge: 2012-06-29 | Disposition: A | Discharge status: Home / Self Care | Payer: Medicare Other | Source: Ambulatory Visit | Attending: Radiation Oncology | Admitting: Radiation Oncology

## 2012-07-02 ENCOUNTER — Ambulatory Visit
Admission: RE | Admit: 2012-07-02 | Discharge: 2012-07-02 | Disposition: A | Discharge status: Home / Self Care | Payer: Medicare Other | Source: Ambulatory Visit | Attending: Radiation Oncology | Admitting: Radiation Oncology

## 2012-07-02 ENCOUNTER — Encounter: Payer: Self-pay | Admitting: Radiation Oncology

## 2012-07-02 VITALS — BP 125/80 | HR 91 | Temp 97.8°F | Resp 20 | Wt 214.6 lb

## 2012-07-02 DIAGNOSIS — C61 Malignant neoplasm of prostate: Secondary | ICD-10-CM

## 2012-07-02 NOTE — Progress Notes (Signed)
   Weekly Management Note:  outpatient Current Dose:  35.1 Gy  Projected Dose: 78 Gy   Narrative:  The patient presents for routine under treatment assessment.  CBCT/MVCT images/Port film x-rays were reviewed.  The chart was checked. He reports he started pyridium which is helping dysuria, a bit. No fevers or chills.  Stable nocturia x3-4/night.  No a huge bother to him. Freq in daytime, too.  Physical Findings:  weight is 214 lb 9.6 oz (97.342 kg). His oral temperature is 97.8 F (36.6 C). His blood pressure is 125/80 and his pulse is 91. His respiration is 20.  NAD -well appearing  Impression:  The patient is tolerating radiotherapy.  Plan:  Continue radiotherapy as planned. Offered Flomax - but he does not want it quite yet. Will reevaluate with Dr. Dayton Scrape next week.  ________________________________   Lonie Peak, M.D.

## 2012-07-02 NOTE — Progress Notes (Signed)
Pt reports urinary freq q 2-3 hrs in daytime and night w/slight dysuria. He denies pain, bowel issues, fatigue, loss of appetite.

## 2012-07-03 ENCOUNTER — Ambulatory Visit
Admission: RE | Admit: 2012-07-03 | Discharge: 2012-07-03 | Disposition: A | Discharge status: Home / Self Care | Payer: Medicare Other | Source: Ambulatory Visit | Attending: Radiation Oncology | Admitting: Radiation Oncology

## 2012-07-04 ENCOUNTER — Ambulatory Visit
Admission: RE | Admit: 2012-07-04 | Discharge: 2012-07-04 | Disposition: A | Discharge status: Home / Self Care | Payer: Medicare Other | Source: Ambulatory Visit | Attending: Radiation Oncology | Admitting: Radiation Oncology

## 2012-07-05 ENCOUNTER — Ambulatory Visit
Admission: RE | Admit: 2012-07-05 | Discharge: 2012-07-05 | Disposition: A | Discharge status: Home / Self Care | Payer: Medicare Other | Source: Ambulatory Visit | Attending: Radiation Oncology | Admitting: Radiation Oncology

## 2012-07-09 ENCOUNTER — Encounter: Payer: Self-pay | Admitting: Radiation Oncology

## 2012-07-09 ENCOUNTER — Ambulatory Visit
Admission: RE | Admit: 2012-07-09 | Discharge: 2012-07-09 | Disposition: A | Discharge status: Home / Self Care | Payer: Medicare Other | Source: Ambulatory Visit | Attending: Radiation Oncology | Admitting: Radiation Oncology

## 2012-07-09 VITALS — BP 123/83 | HR 88 | Resp 18

## 2012-07-09 DIAGNOSIS — C61 Malignant neoplasm of prostate: Secondary | ICD-10-CM

## 2012-07-09 NOTE — Progress Notes (Signed)
Weekly Management Note:  Site: Prostate/pelvic lymph nodes Current Dose:  4290  cGy Projected Dose: 7800  cGy  Narrative: The patient is seen today for routine under treatment assessment. CBCT/MVCT images/port films were reviewed. The chart was reviewed.   Bladder filling is less than optimal today. He does have nocturia x3. He has some urinary frequency during the day and needs to urinate every 3-4 hours. He is on Pyridium which helps him with his dysuria. No GI difficulties.  Physical Examination:  Filed Vitals:   07/09/12 0830  BP: 123/83  Pulse: 88  Resp: 18  .  Weight:  . No change .  Impression: Tolerating radiation therapy well. I encouraged him to have better bladder filling.  Plan: Continue radiation therapy as planned.

## 2012-07-09 NOTE — Progress Notes (Signed)
Reports mild burning with urination. Reports getting up on average three times per night to void. Denies hematuria. Denies incontinence. Denies diarrhea. Denies difficulty or painful bowel movements. Reports urine stream alternates between strong and weak.

## 2012-07-10 ENCOUNTER — Ambulatory Visit
Admission: RE | Admit: 2012-07-10 | Discharge: 2012-07-10 | Disposition: A | Discharge status: Home / Self Care | Payer: Medicare Other | Source: Ambulatory Visit | Attending: Radiation Oncology | Admitting: Radiation Oncology

## 2012-07-11 ENCOUNTER — Ambulatory Visit
Admission: RE | Admit: 2012-07-11 | Discharge: 2012-07-11 | Disposition: A | Discharge status: Home / Self Care | Payer: Medicare Other | Source: Ambulatory Visit | Attending: Radiation Oncology | Admitting: Radiation Oncology

## 2012-07-12 ENCOUNTER — Ambulatory Visit
Admission: RE | Admit: 2012-07-12 | Discharge: 2012-07-12 | Disposition: A | Discharge status: Home / Self Care | Payer: Medicare Other | Source: Ambulatory Visit | Attending: Radiation Oncology | Admitting: Radiation Oncology

## 2012-07-13 ENCOUNTER — Ambulatory Visit
Admission: RE | Admit: 2012-07-13 | Discharge: 2012-07-13 | Disposition: A | Discharge status: Home / Self Care | Payer: Medicare Other | Source: Ambulatory Visit | Attending: Radiation Oncology | Admitting: Radiation Oncology

## 2012-07-16 ENCOUNTER — Ambulatory Visit
Admission: RE | Admit: 2012-07-16 | Discharge: 2012-07-16 | Disposition: A | Discharge status: Home / Self Care | Payer: Medicare Other | Source: Ambulatory Visit | Attending: Radiation Oncology | Admitting: Radiation Oncology

## 2012-07-16 ENCOUNTER — Encounter: Payer: Self-pay | Admitting: Radiation Oncology

## 2012-07-16 VITALS — BP 137/80 | HR 85 | Temp 97.5°F | Resp 20 | Wt 214.5 lb

## 2012-07-16 DIAGNOSIS — C61 Malignant neoplasm of prostate: Secondary | ICD-10-CM

## 2012-07-16 NOTE — Progress Notes (Signed)
Pt took Imodium for diarrhea, last dose 3 days ago w/relief. He denies dysuria, urgency, weak stream, fatigue. He has slight loss of appetite, nocturia x 3. He states he has not needed Pyridium in past week.

## 2012-07-16 NOTE — Progress Notes (Signed)
Weekly Management Note:  Site: Last pelvic lymph nodes Current Dose:  5265  cGy Projected Dose: 7800  cGy  Narrative: The patient is seen today for routine under treatment assessment. CBCT/MVCT images/port films were reviewed. The chart was reviewed.   Bladder filling is satisfactory, but could be better. No new GU or GI difficulties. He is having less dysuria.  Physical Examination:  Filed Vitals:   07/16/12 0823  BP: 137/80  Pulse: 85  Temp: 97.5 F (36.4 C)  Resp: 20  .  Weight: 214 lb 8 oz (97.297 kg). No change.  Impression: Tolerating radiation therapy well.  Plan: Continue radiation therapy as planned.

## 2012-07-17 ENCOUNTER — Ambulatory Visit
Admission: RE | Admit: 2012-07-17 | Discharge: 2012-07-17 | Disposition: A | Discharge status: Home / Self Care | Payer: Medicare Other | Source: Ambulatory Visit | Attending: Radiation Oncology | Admitting: Radiation Oncology

## 2012-07-18 ENCOUNTER — Ambulatory Visit
Admission: RE | Admit: 2012-07-18 | Discharge: 2012-07-18 | Disposition: A | Discharge status: Home / Self Care | Payer: Medicare Other | Source: Ambulatory Visit | Attending: Radiation Oncology | Admitting: Radiation Oncology

## 2012-07-19 ENCOUNTER — Ambulatory Visit
Admission: RE | Admit: 2012-07-19 | Discharge: 2012-07-19 | Disposition: A | Discharge status: Home / Self Care | Payer: Medicare Other | Source: Ambulatory Visit | Attending: Radiation Oncology | Admitting: Radiation Oncology

## 2012-07-20 ENCOUNTER — Ambulatory Visit
Admission: RE | Admit: 2012-07-20 | Discharge: 2012-07-20 | Disposition: A | Discharge status: Home / Self Care | Payer: Medicare Other | Source: Ambulatory Visit | Attending: Radiation Oncology | Admitting: Radiation Oncology

## 2012-07-23 ENCOUNTER — Encounter: Payer: Self-pay | Admitting: Radiation Oncology

## 2012-07-23 ENCOUNTER — Ambulatory Visit
Admission: RE | Admit: 2012-07-23 | Discharge: 2012-07-23 | Disposition: A | Discharge status: Home / Self Care | Payer: Medicare Other | Source: Ambulatory Visit | Attending: Radiation Oncology | Admitting: Radiation Oncology

## 2012-07-23 VITALS — BP 136/87 | HR 81 | Temp 97.9°F | Resp 20 | Wt 215.6 lb

## 2012-07-23 DIAGNOSIS — C61 Malignant neoplasm of prostate: Secondary | ICD-10-CM

## 2012-07-23 NOTE — Progress Notes (Signed)
Weekly prostate rad txs, 32/40 completed, nocturia 3x, slight dysuria at times, regular bowels, no hematuria, no c/o pain 8:36 AM

## 2012-07-23 NOTE — Progress Notes (Signed)
   Weekly Management Note:  outpatient Current Dose:  62.4Gy  Projected Dose: 78 Gy   Narrative:  The patient presents for routine under treatment assessment.  CBCT/MVCT images/Port film x-rays were reviewed.  The chart was checked. Doing well. Slight dysuria. No diarrhea. Nocturia x3 stable. No new complaints  Physical Findings:  weight is 215 lb 9.6 oz (97.796 kg). His oral temperature is 97.9 F (36.6 C). His blood pressure is 136/87 and his pulse is 81. His respiration is 20.  NAD  Impression:  The patient is tolerating radiotherapy.  Plan:  Continue radiotherapy as planned.  ________________________________   Lonie Peak, M.D.

## 2012-07-24 ENCOUNTER — Ambulatory Visit
Admission: RE | Admit: 2012-07-24 | Discharge: 2012-07-24 | Disposition: A | Discharge status: Home / Self Care | Payer: Medicare Other | Source: Ambulatory Visit | Attending: Radiation Oncology | Admitting: Radiation Oncology

## 2012-07-25 ENCOUNTER — Ambulatory Visit
Admission: RE | Admit: 2012-07-25 | Discharge: 2012-07-25 | Disposition: A | Discharge status: Home / Self Care | Payer: Medicare Other | Source: Ambulatory Visit | Attending: Radiation Oncology | Admitting: Radiation Oncology

## 2012-07-26 ENCOUNTER — Ambulatory Visit
Admission: RE | Admit: 2012-07-26 | Discharge: 2012-07-26 | Disposition: A | Discharge status: Home / Self Care | Payer: Medicare Other | Source: Ambulatory Visit | Attending: Radiation Oncology | Admitting: Radiation Oncology

## 2012-07-27 ENCOUNTER — Ambulatory Visit
Admission: RE | Admit: 2012-07-27 | Discharge: 2012-07-27 | Disposition: A | Discharge status: Home / Self Care | Payer: Medicare Other | Source: Ambulatory Visit | Attending: Radiation Oncology | Admitting: Radiation Oncology

## 2012-07-30 ENCOUNTER — Ambulatory Visit
Admission: RE | Admit: 2012-07-30 | Discharge: 2012-07-30 | Disposition: A | Discharge status: Home / Self Care | Payer: Medicare Other | Source: Ambulatory Visit | Attending: Radiation Oncology | Admitting: Radiation Oncology

## 2012-07-30 ENCOUNTER — Encounter: Payer: Self-pay | Admitting: Radiation Oncology

## 2012-07-30 VITALS — BP 117/81 | HR 60 | Temp 98.3°F | Resp 20 | Wt 213.3 lb

## 2012-07-30 DIAGNOSIS — C61 Malignant neoplasm of prostate: Secondary | ICD-10-CM

## 2012-07-30 NOTE — Progress Notes (Addendum)
wekly rad txs prostate 37/40 completed, stil has urgency, slight to very little dysuria, did have diarrhea on Saturday,took imodium, is better stated patient, no hematuria, nocturia x5 last night 8:37 AM

## 2012-07-30 NOTE — Progress Notes (Signed)
Weekly Management Note:  Site: Prostate/pelvic lymph nodes Current Dose:  7215  cGy Projected Dose: 7800  cGy  Narrative: The patient is seen today for routine under treatment assessment. CBCT/MVCT images/port films were reviewed. The chart was reviewed.   Bladder filling is acceptable but not ideal. He is without new GU or GI difficulties. He had nocturia x5 last night. This past Saturday he took 1 Imodium for loose bowels.  Physical Examination:  Filed Vitals:   07/30/12 0836  BP: 117/81  Pulse: 60  Temp: 98.3 F (36.8 C)  Resp: 20  .  Weight: 213 lb 4.8 oz (96.752 kg). No change.  Impression: Tolerating radiation therapy well. I again encouraged him to improve his bladder filling. He'll finish his radiation therapy this Thursday.  Plan: Continue radiation therapy as planned. One-month followup visit after completion of radiation therapy this Thursday.

## 2012-07-31 ENCOUNTER — Ambulatory Visit
Admission: RE | Admit: 2012-07-31 | Discharge: 2012-07-31 | Disposition: A | Discharge status: Home / Self Care | Payer: Medicare Other | Source: Ambulatory Visit | Attending: Radiation Oncology | Admitting: Radiation Oncology

## 2012-08-01 ENCOUNTER — Ambulatory Visit
Admission: RE | Admit: 2012-08-01 | Discharge: 2012-08-01 | Disposition: A | Discharge status: Home / Self Care | Payer: Medicare Other | Source: Ambulatory Visit | Attending: Radiation Oncology | Admitting: Radiation Oncology

## 2012-08-02 ENCOUNTER — Ambulatory Visit
Admission: RE | Admit: 2012-08-02 | Discharge: 2012-08-02 | Disposition: A | Discharge status: Home / Self Care | Payer: Medicare Other | Source: Ambulatory Visit | Attending: Radiation Oncology | Admitting: Radiation Oncology

## 2012-08-02 ENCOUNTER — Encounter: Payer: Self-pay | Admitting: Radiation Oncology

## 2012-08-02 NOTE — Progress Notes (Signed)
[facility] End of Treatment Note  Name:Perez ZEPLIN ALESHIRE  Date: 08/02/2012 WUJ:[id number] DOB:05/05/1940   Status:outpatient    CC: Lieutenant Diego, MD  Dr. Marcine Matar  REFERRING PHYSICIAN:   Dr. Marcine Matar   DIAGNOSIS: Clinical stage T2 B. high-risk adenocarcinoma prostate    INDICATION FOR TREATMENT: Curative   TREATMENT DATES: 06/07/2012 through 08/02/2012                          SITE/DOSE:  Prostate/seminal vesicles 7800 cGy in 40 sessions, pelvic lymph nodes 5600 cGy in 40 sessions                         BEAMS/ENERGY:  6 MV photons helical IMRT Tomotherapy                 NARRATIVE: The patient tolerated treatment well with some loosening of his bowels during his last therapy for which he took Imodium when necessary.                         PLAN: Routine followup in one month. Patient instructed to call if questions or worsening complaints in interim.

## 2012-08-24 ENCOUNTER — Encounter: Payer: Self-pay | Admitting: Radiation Oncology

## 2012-08-28 ENCOUNTER — Ambulatory Visit
Admission: RE | Admit: 2012-08-28 | Discharge: 2012-08-28 | Disposition: A | Discharge status: Home / Self Care | Payer: Medicare Other | Source: Ambulatory Visit | Attending: Radiation Oncology | Admitting: Radiation Oncology

## 2012-08-28 ENCOUNTER — Encounter: Payer: Self-pay | Admitting: Radiation Oncology

## 2012-08-28 VITALS — BP 132/78 | HR 107 | Temp 97.8°F | Resp 18 | Wt 215.6 lb

## 2012-08-28 DIAGNOSIS — C61 Malignant neoplasm of prostate: Secondary | ICD-10-CM

## 2012-08-28 HISTORY — DX: Personal history of irradiation: Z92.3

## 2012-08-28 NOTE — Progress Notes (Signed)
CC: Dr. Marcine Matar  Followup note:  [name] returns today approximately 1 month following completion of external beam/IMRT in the management of his Stage T2 B. high-risk adenocarcinoma prostate. He is to to his prostate/seminal vesicles and pelvic lymph nodes. He declined androgen deprivation therapy because of the expectation that he would lose his sex drive. He remains sexually active. No GU or GI difficulties. He is urinary bowel habits are back to baseline. He'll see [name] for a followup visit in October.  Physical examination: Alert and oriented. Filed Vitals:   08/28/12 1556  BP: 132/78  Pulse: 107  Temp: 97.8 F (36.6 C)  Resp: 18   Rectal examination not performed today.  Impression: Satisfactory progress.  Plan: Followup visit with [name] in October at which time he'll probably have a PSA determination. Again, he declines androgen deprivation therapy. I've not scheduled the patient for a formal followup visit and I ask that [name] keep me posted on his progress.

## 2012-08-28 NOTE — Progress Notes (Signed)
Reports burning with urination stopped two days ago. Denies hematuria. Reports on average he gets up four times per night to void. Reports urine stream alternates from strong to weak and "comes out in a spray instead of a stream." Reports blood in stool two weeks ago but, denies any since. Reports sleeplessness. Reports fatigue. Denies night sweats, bone pain or unintentional weight loss.

## 2014-01-25 ENCOUNTER — Other Ambulatory Visit: Payer: Self-pay | Admitting: Radiation Oncology

## 2014-02-25 ENCOUNTER — Emergency Department (HOSPITAL_COMMUNITY): Payer: Medicare Other

## 2014-02-25 ENCOUNTER — Encounter (HOSPITAL_COMMUNITY): Payer: Self-pay | Admitting: *Deleted

## 2014-02-25 ENCOUNTER — Emergency Department (HOSPITAL_COMMUNITY)
Admission: EM | Admit: 2014-02-25 | Discharge: 2014-02-25 | Disposition: A | Discharge status: Home / Self Care | Payer: Medicare Other | Attending: Emergency Medicine | Admitting: Emergency Medicine

## 2014-02-25 DIAGNOSIS — E785 Hyperlipidemia, unspecified: Secondary | ICD-10-CM | POA: Diagnosis not present

## 2014-02-25 DIAGNOSIS — Z872 Personal history of diseases of the skin and subcutaneous tissue: Secondary | ICD-10-CM | POA: Insufficient documentation

## 2014-02-25 DIAGNOSIS — M1990 Unspecified osteoarthritis, unspecified site: Secondary | ICD-10-CM | POA: Insufficient documentation

## 2014-02-25 DIAGNOSIS — Z79899 Other long term (current) drug therapy: Secondary | ICD-10-CM | POA: Diagnosis not present

## 2014-02-25 DIAGNOSIS — R300 Dysuria: Secondary | ICD-10-CM | POA: Diagnosis present

## 2014-02-25 DIAGNOSIS — H409 Unspecified glaucoma: Secondary | ICD-10-CM | POA: Diagnosis not present

## 2014-02-25 DIAGNOSIS — R319 Hematuria, unspecified: Secondary | ICD-10-CM | POA: Diagnosis not present

## 2014-02-25 DIAGNOSIS — I10 Essential (primary) hypertension: Secondary | ICD-10-CM | POA: Diagnosis not present

## 2014-02-25 DIAGNOSIS — Z8673 Personal history of transient ischemic attack (TIA), and cerebral infarction without residual deficits: Secondary | ICD-10-CM | POA: Diagnosis not present

## 2014-02-25 DIAGNOSIS — Z87442 Personal history of urinary calculi: Secondary | ICD-10-CM | POA: Diagnosis not present

## 2014-02-25 DIAGNOSIS — Z87891 Personal history of nicotine dependence: Secondary | ICD-10-CM | POA: Insufficient documentation

## 2014-02-25 DIAGNOSIS — G8929 Other chronic pain: Secondary | ICD-10-CM | POA: Diagnosis not present

## 2014-02-25 DIAGNOSIS — F419 Anxiety disorder, unspecified: Secondary | ICD-10-CM | POA: Insufficient documentation

## 2014-02-25 DIAGNOSIS — Z7982 Long term (current) use of aspirin: Secondary | ICD-10-CM | POA: Insufficient documentation

## 2014-02-25 DIAGNOSIS — Z8619 Personal history of other infectious and parasitic diseases: Secondary | ICD-10-CM | POA: Diagnosis not present

## 2014-02-25 DIAGNOSIS — Z8546 Personal history of malignant neoplasm of prostate: Secondary | ICD-10-CM | POA: Insufficient documentation

## 2014-02-25 DIAGNOSIS — R109 Unspecified abdominal pain: Secondary | ICD-10-CM

## 2014-02-25 LAB — CBC WITH DIFFERENTIAL/PLATELET
BASOS ABS: 0 10*3/uL (ref 0.0–0.1)
BASOS PCT: 0 % (ref 0–1)
EOS PCT: 3 % (ref 0–5)
Eosinophils Absolute: 0.1 10*3/uL (ref 0.0–0.7)
HEMATOCRIT: 40.5 % (ref 39.0–52.0)
HEMOGLOBIN: 13.3 g/dL (ref 13.0–17.0)
LYMPHS PCT: 27 % (ref 12–46)
Lymphs Abs: 0.9 10*3/uL (ref 0.7–4.0)
MCH: 32.4 pg (ref 26.0–34.0)
MCHC: 32.8 g/dL (ref 30.0–36.0)
MCV: 98.5 fL (ref 78.0–100.0)
Monocytes Absolute: 0.3 10*3/uL (ref 0.1–1.0)
Monocytes Relative: 9 % (ref 3–12)
Neutro Abs: 2.1 10*3/uL (ref 1.7–7.7)
Neutrophils Relative %: 61 % (ref 43–77)
Platelets: 120 10*3/uL — ABNORMAL LOW (ref 150–400)
RBC: 4.11 MIL/uL — AB (ref 4.22–5.81)
RDW: 12.3 % (ref 11.5–15.5)
WBC: 3.4 10*3/uL — AB (ref 4.0–10.5)

## 2014-02-25 LAB — URINALYSIS, ROUTINE W REFLEX MICROSCOPIC
Bilirubin Urine: NEGATIVE
GLUCOSE, UA: NEGATIVE mg/dL
KETONES UR: NEGATIVE mg/dL
LEUKOCYTES UA: NEGATIVE
Nitrite: NEGATIVE
PH: 5.5 (ref 5.0–8.0)
Protein, ur: NEGATIVE mg/dL
Specific Gravity, Urine: 1.021 (ref 1.005–1.030)
Urobilinogen, UA: 0.2 mg/dL (ref 0.0–1.0)

## 2014-02-25 LAB — BASIC METABOLIC PANEL
Anion gap: 6 (ref 5–15)
BUN: 20 mg/dL (ref 6–23)
CALCIUM: 9.4 mg/dL (ref 8.4–10.5)
CO2: 26 mmol/L (ref 19–32)
Chloride: 112 mmol/L (ref 96–112)
Creatinine, Ser: 1.57 mg/dL — ABNORMAL HIGH (ref 0.50–1.35)
GFR calc Af Amer: 49 mL/min — ABNORMAL LOW (ref >90)
GFR, EST NON AFRICAN AMERICAN: 42 mL/min — AB (ref >90)
GLUCOSE: 115 mg/dL — AB (ref 70–99)
Potassium: 4.6 mmol/L (ref 3.5–5.1)
SODIUM: 144 mmol/L (ref 135–145)

## 2014-02-25 LAB — URINE MICROSCOPIC-ADD ON

## 2014-02-25 MED ORDER — IOHEXOL 300 MG/ML  SOLN
100.0000 mL | Freq: Once | INTRAMUSCULAR | Status: AC | PRN
  Administered 2014-02-25: 80 mL via INTRAVENOUS

## 2014-02-25 MED ORDER — ONDANSETRON 4 MG PO TBDP: ORAL_TABLET | ORAL | Status: DC

## 2014-02-25 MED ORDER — IOHEXOL 300 MG/ML  SOLN
50.0000 mL | Freq: Once | INTRAMUSCULAR | Status: AC | PRN
  Administered 2014-02-25: 50 mL via ORAL

## 2014-02-25 MED ORDER — OXYCODONE-ACETAMINOPHEN 5-325 MG PO TABS: 1.0000 | ORAL_TABLET | ORAL | Status: DC | PRN

## 2014-02-25 MED ORDER — PHENAZOPYRIDINE HCL 200 MG PO TABS: 200.0000 mg | ORAL_TABLET | Freq: Three times a day (TID) | ORAL | Status: DC | PRN

## 2014-02-25 NOTE — ED Notes (Signed)
Patient transported to CT 

## 2014-02-25 NOTE — ED Notes (Signed)
MD made aware of bp elevation, pt told to take additional bp med tonight and follow up with primary md.

## 2014-02-25 NOTE — Discharge Instructions (Signed)
Dysuria Dysuria is the medical term for pain with urination. There are many causes for dysuria, but urinary tract infection is the most common. If a urinalysis was performed it can show that there is a urinary tract infection. A urine culture confirms that you or your child is sick. You will need to follow up with a healthcare provider because:  If a urine culture was done you will need to know the culture results and treatment recommendations.  If the urine culture was positive, you or your child will need to be put on antibiotics or know if the antibiotics prescribed are the right antibiotics for your urinary tract infection.  If the urine culture is negative (no urinary tract infection), then other causes may need to be explored or antibiotics need to be stopped. Today laboratory work may have been done and there does not seem to be an infection. If cultures were done they will take at least 24 to 48 hours to be completed. Today x-rays may have been taken and they read as normal. No cause can be found for the problems. The x-rays may be re-read by a radiologist and you will be contacted if additional findings are made. You or your child may have been put on medications to help with this problem until you can see your primary caregiver. If the problems get better, see your primary caregiver if the problems return. If you were given antibiotics (medications which kill germs), take all of the mediations as directed for the full course of treatment.  If laboratory work was done, you need to find the results. Leave a telephone number where you can be reached. If this is not possible, make sure you find out how you are to get test results. HOME CARE INSTRUCTIONS   Drink lots of fluids. For adults, drink eight, 8 ounce glasses of clear juice or water a day. For children, replace fluids as suggested by your caregiver.  Empty the bladder often. Avoid holding urine for long periods of time.  After a bowel  movement, women should cleanse front to back, using each tissue only once.  Empty your bladder before and after sexual intercourse.  Take all the medicine given to you until it is gone. You may feel better in a few days, but TAKE ALL MEDICINE.  Avoid caffeine, tea, alcohol and carbonated beverages, because they tend to irritate the bladder.  In men, alcohol may irritate the prostate.  Only take over-the-counter or prescription medicines for pain, discomfort, or fever as directed by your caregiver.  If your caregiver has given you a follow-up appointment, it is very important to keep that appointment. Not keeping the appointment could result in a chronic or permanent injury, pain, and disability. If there is any problem keeping the appointment, you must call back to this facility for assistance. SEEK IMMEDIATE MEDICAL CARE IF:   Back pain develops.  A fever develops.  There is nausea (feeling sick to your stomach) or vomiting (throwing up).  Problems are no better with medications or are getting worse. MAKE SURE YOU:   Understand these instructions.  Will watch your condition.  Will get help right away if you are not doing well or get worse. Document Released: 09/18/2003 Document Revised: 03/14/2011 Document Reviewed: 07/26/2007 [facility] Patient Information 2015 [place], [place]. This information is not intended to replace advice given to you by your health care provider. Make sure you discuss any questions you have with your health care provider. Kidney Stones Kidney stones (  urolithiasis) are deposits that form inside your kidneys. The intense pain is caused by the stone moving through the urinary tract. When the stone moves, the ureter goes into spasm around the stone. The stone is usually passed in the urine.  CAUSES   A disorder that makes certain neck glands produce too much parathyroid hormone (primary hyperparathyroidism).  A buildup of uric acid crystals, similar to gout in  your joints.  Narrowing (stricture) of the ureter.  A kidney obstruction present at birth (congenital obstruction).  Previous surgery on the kidney or ureters.  Numerous kidney infections. SYMPTOMS   Feeling sick to your stomach (nauseous).  Throwing up (vomiting).  Blood in the urine (hematuria).  Pain that usually spreads (radiates) to the groin.  Frequency or urgency of urination. DIAGNOSIS   Taking a history and physical exam.  Blood or urine tests.  CT scan.  Occasionally, an examination of the inside of the urinary bladder (cystoscopy) is performed. TREATMENT   Observation.  Increasing your fluid intake.  Extracorporeal shock wave lithotripsy--This is a noninvasive procedure that uses shock waves to break up kidney stones.  Surgery may be needed if you have severe pain or persistent obstruction. There are various surgical procedures. Most of the procedures are performed with the use of small instruments. Only small incisions are needed to accommodate these instruments, so recovery time is minimized. The size, location, and chemical composition are all important variables that will determine the proper choice of action for you. Talk to your health care provider to better understand your situation so that you will minimize the risk of injury to yourself and your kidney.  HOME CARE INSTRUCTIONS   Drink enough water and fluids to keep your urine clear or pale yellow. This will help you to pass the stone or stone fragments.  Strain all urine through the provided strainer. Keep all particulate matter and stones for your health care provider to see. The stone causing the pain may be as small as a grain of salt. It is very important to use the strainer each and every time you pass your urine. The collection of your stone will allow your health care provider to analyze it and verify that a stone has actually passed. The stone analysis will often identify what you can do to  reduce the incidence of recurrences.  Only take over-the-counter or prescription medicines for pain, discomfort, or fever as directed by your health care provider.  Make a follow-up appointment with your health care provider as directed.  Get follow-up X-rays if required. The absence of pain does not always mean that the stone has passed. It may have only stopped moving. If the urine remains completely obstructed, it can cause loss of kidney function or even complete destruction of the kidney. It is your responsibility to make sure X-rays and follow-ups are completed. Ultrasounds of the kidney can show blockages and the status of the kidney. Ultrasounds are not associated with any radiation and can be performed easily in a matter of minutes. SEEK MEDICAL CARE IF:  You experience pain that is progressive and unresponsive to any pain medicine you have been prescribed. SEEK IMMEDIATE MEDICAL CARE IF:   Pain cannot be controlled with the prescribed medicine.  You have a fever or shaking chills.  The severity or intensity of pain increases over 18 hours and is not relieved by pain medicine.  You develop a new onset of abdominal pain.  You feel faint or pass out.  You are  unable to urinate. MAKE SURE YOU:   Understand these instructions.  Will watch your condition.  Will get help right away if you are not doing well or get worse. Document Released: 12/20/2004 Document Revised: 08/22/2012 Document Reviewed: 05/23/2012 [facility] Patient Information 2015 [place], [place]. This information is not intended to replace advice given to you by your health care provider. Make sure you discuss any questions you have with your health care provider.

## 2014-02-25 NOTE — ED Notes (Signed)
Pt reports urinary frequency x2 months, dysuria x3 weeks. Pain 4/10. Hx HTN. Reports took HTN meds this am.

## 2014-02-26 NOTE — ED Provider Notes (Signed)
CSN: [id number]     Arrival date & time 02/25/14  1023 History   First MD Initiated Contact with Patient 02/25/14 1131     Chief Complaint  Patient presents with  . Dysuria     (Consider location/radiation/quality/duration/timing/severity/associated sxs/prior Treatment) Patient is a 74 y.o. male presenting with dysuria.  Dysuria This is a new problem. Episode onset: 2 months ago. The problem occurs constantly. The problem has not changed since onset.Pertinent negatives include no chest pain, no abdominal pain, no headaches and no shortness of breath. Nothing aggravates the symptoms. Nothing relieves the symptoms. He has tried nothing for the symptoms.    Past Medical History  Diagnosis Date  . Herniated disc   . Hypertension   . Renal calculi BILATERAL   . Chronic back pain   . History of TIA (transient ischemic attack) 2010--  NO RESIDUAL    NONE SINCE  . Dyslipidemia   . Bilateral flank pain     DUE TO KIDNEY STONES  . Weakness of both legs   . Eczema   . History of kidney stones   . Arthritis   . Frequency of urination   . Urgency of urination   . Nocturia   . Hematuria   . Anxiety   . Glaucoma PT STATES RX RAN OUT  . Glaucoma   . Prostate cancer 03/2012  . Elevated PSA 01/11/2012    25.77  . History of shingles   . History of radiation therapy 06/07/12-08/02/12    prostate, 7800 cGy/40 sessions/ 5600cGy pelvic lymph nodes/40 sessions   Past Surgical History  Procedure Laterality Date  . Percutaneous nephrolithotomy  12-10-2007  &  11-07-2005    RIGHT RENAL CALCULI  . Cysto/ bilateral retrograde pyelogram/ left ureterscopic stone extraction  11-14-2005    LEFT URETERAL CALCULUS  . Cysto/ bilateral ureteral stents  11-06-2005  . Right ureteroscopic stone extraction  05-29-2003  . Right ureteral stent placement  02-10-2003  . Repair left inguinal hernia w/ mesh  12-23-2002  . Internal urethrotomy/ turp  12-23-2002    BPH W/ STRICTURE  . Cysto/ left retrograde  pyelogram/  left stent placment  05-21-2011  . Extracorporeal shock wave lithotripsy    . Transthoracic echocardiogram  12-19-2008    LVSF NORMAL/ EF 57-32%/ GRADE I DIASTOLIC DYSFUNCTION/   . Cystoscopy w/ ureteral stent removal  06/15/2011    Procedure: CYSTOSCOPY WITH STENT REMOVAL;  Surgeon: Franchot Gallo, MD;  Location: [facility];  Service: Urology;  Laterality: Left;  . Ureteroscopy  06/15/2011    Procedure: URETEROSCOPY;  Surgeon: Franchot Gallo, MD;  Location: [facility];  Service: Urology;  Laterality: Bilateral;  . Biliary stent placement  06/15/2011    Procedure: BILIARY STENT PLACEMENT;  Surgeon: Franchot Gallo, MD;  Location: [facility];  Service: Urology;  Laterality: Bilateral;  . Transurethral resection of bladder  2004   History reviewed. No pertinent family history. History  Substance Use Topics  . Smoking status: Former Smoker -- 40 years    Types: Cigarettes    Quit date: 06/10/1990  . Smokeless tobacco: Never Used  . Alcohol Use: No    Review of Systems  Respiratory: Negative for shortness of breath.   Cardiovascular: Negative for chest pain.  Gastrointestinal: Negative for abdominal pain.  Genitourinary: Positive for dysuria.  Neurological: Negative for headaches.  All other systems reviewed and are negative.     Allergies  Tape; Doxycycline hyclate; and Sulfa antibiotics  Home Medications  Prior to Admission medications   Medication Sig Start Date End Date Taking? Authorizing Provider  ALPRAZolam Duanne Moron) 0.5 MG tablet Take 0.5 mg by mouth daily as needed. Anxiety/sleep   Yes Historical Provider, MD  amLODipine (NORVASC) 10 MG tablet Take 1 tablet (10 mg total) by mouth daily. 04/12/12  Yes [name], MD  aspirin EC 81 MG EC tablet Take 1 tablet (81 mg total) by mouth daily. 04/11/12  Yes [name], MD  atenolol (TENORMIN) 50 MG tablet Take 1 tablet by mouth daily. 01/25/14  Yes  Historical Provider, MD  atorvastatin (LIPITOR) 40 MG tablet Take 1 tablet (40 mg total) by mouth daily at 6 PM. 04/11/12  Yes [name], MD  Brinzolamide-Brimonidine [facility]) 1-0.2 % SUSP Place 1 drop into both eyes daily as needed (irritation).    Yes Historical Provider, MD  HYDROcodone-acetaminophen (NORCO) 10-325 MG per tablet Take 1 tablet by mouth 4 (four) times daily. 02/12/14  Yes Historical Provider, MD  potassium citrate (UROCIT-K) 10 MEQ (1080 MG) SR tablet Take 1 tablet (10 mEq total) by mouth 3 (three) times daily with meals. Patient taking differently: Take 10 mEq by mouth daily.  06/15/11  Yes Jorja Loa, MD  Travoprost, BAK Free, (TRAVATAN) 0.004 % SOLN ophthalmic solution Place 1 drop into both eyes 3 times/day as needed-between meals & bedtime (irritation).    Yes Historical Provider, MD  vitamin C (ASCORBIC ACID) 500 MG tablet Take 500 mg by mouth daily.   Yes Historical Provider, MD  hydrochlorothiazide (HYDRODIURIL) 25 MG tablet Take 1 tablet (25 mg total) by mouth daily. Patient not taking: Reported on 02/25/2014 04/11/12   [name], MD  loratadine (CLARITIN) 10 MG tablet Take 1 tablet (10 mg total) by mouth daily. Patient not taking: Reported on 02/25/2014 04/11/12   [name], MD  ondansetron (ZOFRAN ODT) 4 MG disintegrating tablet 4mg  ODT q4 hours prn nausea/vomit 02/25/14   [name], MD  oxyCODONE-acetaminophen (PERCOCET/ROXICET) 5-325 MG per tablet Take 1-2 tablets by mouth every 4 (four) hours as needed for severe pain. 02/25/14   [name], MD  phenazopyridine (PYRIDIUM) 200 MG tablet Take 1 tablet (200 mg total) by mouth 3 (three) times daily as needed for pain. 02/25/14   [name], MD   BP 201/108 mmHg  Pulse 68  Temp(Src) 98 F (36.7 C) (Oral)  Resp 16  SpO2 99% Physical Exam  Constitutional: He is oriented to person, place, and time. He appears well-developed and well-nourished.  HENT:  Head: Normocephalic  and atraumatic.  Eyes: Conjunctivae and EOM are normal.  Neck: Normal range of motion. Neck supple.  Cardiovascular: Normal rate, regular rhythm and normal heart sounds.   Pulmonary/Chest: Effort normal and breath sounds normal. No respiratory distress.  Abdominal: He exhibits no distension. There is no tenderness. There is no rebound and no guarding.  Musculoskeletal: Normal range of motion.  Neurological: He is alert and oriented to person, place, and time.  Skin: Skin is warm and dry.  Vitals reviewed.   ED Course  Procedures (including critical care time) Labs Review Labs Reviewed  URINALYSIS, ROUTINE W REFLEX MICROSCOPIC - Abnormal; Notable for the following:    Hgb urine dipstick LARGE (*)    All other components within normal limits  CBC WITH DIFFERENTIAL/PLATELET - Abnormal; Notable for the following:    WBC 3.4 (*)    RBC 4.11 (*)    Platelets 120 (*)    All other components within normal  limits  BASIC METABOLIC PANEL - Abnormal; Notable for the following:    Glucose, Bld 115 (*)    Creatinine, Ser 1.57 (*)    GFR calc non Af Amer 42 (*)    GFR calc Af Amer 49 (*)    All other components within normal limits  URINE MICROSCOPIC-ADD ON    Imaging Review Dg Abd 1 View  02/25/2014   CLINICAL DATA:  Two-month history of dysuria and constipation  EXAM: ABDOMEN - 1 VIEW  COMPARISON:  June 24, 2013  FINDINGS: There is diffuse stool throughout the colon. There is no bowel dilatation or air-fluid level suggesting obstruction. No free air. No abnormal calcifications. There are clips overlying the pubic symphysis region, stable.  IMPRESSION: Diffuse stool throughout colon, consistent with stated diagnosis of constipation. Bowel gas pattern normal. No abnormal calcifications appreciable.   Electronically Signed   By: Lowella Grip III M.D.   On: 02/25/2014 12:29   Ct Abdomen Pelvis W Contrast  02/25/2014   CLINICAL DATA:  Urinary frequency for 2 months. Hypertension. Prostate  cancer. Constipation.  EXAM: CT ABDOMEN AND PELVIS WITH CONTRAST  TECHNIQUE: Multidetector CT imaging of the abdomen and pelvis was performed using the standard protocol following bolus administration of intravenous contrast.  CONTRAST:  86mL OMNIPAQUE IOHEXOL 300 MG/ML  SOLN  COMPARISON:  03/13/2012  FINDINGS: Lower chest: Bibasilar subsegmental atelectasis. Normal heart size without pericardial or pleural effusion.  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 No suspicious liver lesion. Normal gallbladder, without biliary ductal dilatation.  Pancreas: Normal, without mass or ductal dilatation.  Spleen: Normal  Adrenals/Urinary Tract: Normal adrenal glands. Right renal cortical scarring. Bilateral renal collecting system calculi. A dominant stone is positioned within a right extrarenal pelvis at 1.2 cm. No hydronephrosis. Normal urinary bladder.  Stomach/Bowel: Normal stomach, without wall thickening. Colonic diverticulosis, without evidence of diverticulitis. Normal terminal ileum and appendix. Normal small bowel.  Vascular/Lymphatic: Atherosclerotic irregularity at the origin celiac. This remains patent. Aortic and iliac atherosclerosis also identified. No abdominopelvic adenopathy.  Reproductive: Radiation seeds in the prostate.  Other: No significant free fluid. A moderate-sized right inguinal hernia contains fat and minimal fluid, similar.  Periumbilical fat containing ventral hernia.  Musculoskeletal: Degenerative partial fusion of the left sacroiliac joint.  IMPRESSION: 1. Increased bilateral renal stone burden. Dominant stone is positioned within a right extrarenal pelvis. No ureteric stones or significant urinary tract obstruction. 2. No other explanation for patient's symptoms. 3. Right renal atrophy. 4. Radiation seeds in the prostate ; no evidence of metastatic disease.   Electronically Signed   By: Abigail Miyamoto M.D.   On: 02/25/2014 14:56     EKG Interpretation None       MDM   Final diagnoses:  Abdominal pain, acute  Dysuria  Hematuria    74 y.o. male with pertinent PMH of HTN, prior TIA presents with dysuria x 2 months.  No abd pain.  No systemic symptoms.  On arrival vitals and physical exam as above.  Wu with hematuria, bil nephrolithiasis, not ureteral.  Although likely etiology of symptoms is nephrolithiasis, encouraged pt to fu with urology given lack of infection and the fact that the large stone was in the renal pelvis.  DC home in stable condition.    I have reviewed all laboratory and imaging studies if ordered as above  1. Dysuria   2. Abdominal pain, acute   3. Hematuria         [name], MD 02/26/14  0753 

## 2014-06-26 ENCOUNTER — Emergency Department (HOSPITAL_COMMUNITY)
Admission: EM | Admit: 2014-06-26 | Discharge: 2014-06-27 | Disposition: A | Discharge status: Home / Self Care | Payer: Medicare Other | Attending: Emergency Medicine | Admitting: Emergency Medicine

## 2014-06-26 ENCOUNTER — Encounter (HOSPITAL_COMMUNITY): Payer: Self-pay | Admitting: Emergency Medicine

## 2014-06-26 ENCOUNTER — Emergency Department (HOSPITAL_COMMUNITY): Payer: Medicare Other

## 2014-06-26 DIAGNOSIS — G8929 Other chronic pain: Secondary | ICD-10-CM | POA: Insufficient documentation

## 2014-06-26 DIAGNOSIS — T402X5A Adverse effect of other opioids, initial encounter: Secondary | ICD-10-CM | POA: Diagnosis not present

## 2014-06-26 DIAGNOSIS — Z872 Personal history of diseases of the skin and subcutaneous tissue: Secondary | ICD-10-CM | POA: Diagnosis not present

## 2014-06-26 DIAGNOSIS — Z79899 Other long term (current) drug therapy: Secondary | ICD-10-CM | POA: Diagnosis not present

## 2014-06-26 DIAGNOSIS — Z87891 Personal history of nicotine dependence: Secondary | ICD-10-CM | POA: Insufficient documentation

## 2014-06-26 DIAGNOSIS — Z8619 Personal history of other infectious and parasitic diseases: Secondary | ICD-10-CM | POA: Diagnosis not present

## 2014-06-26 DIAGNOSIS — M1990 Unspecified osteoarthritis, unspecified site: Secondary | ICD-10-CM | POA: Insufficient documentation

## 2014-06-26 DIAGNOSIS — R109 Unspecified abdominal pain: Secondary | ICD-10-CM | POA: Insufficient documentation

## 2014-06-26 DIAGNOSIS — K5903 Drug induced constipation: Secondary | ICD-10-CM

## 2014-06-26 DIAGNOSIS — K5900 Constipation, unspecified: Secondary | ICD-10-CM | POA: Insufficient documentation

## 2014-06-26 DIAGNOSIS — Z8673 Personal history of transient ischemic attack (TIA), and cerebral infarction without residual deficits: Secondary | ICD-10-CM | POA: Diagnosis not present

## 2014-06-26 DIAGNOSIS — Z87442 Personal history of urinary calculi: Secondary | ICD-10-CM | POA: Diagnosis not present

## 2014-06-26 DIAGNOSIS — F419 Anxiety disorder, unspecified: Secondary | ICD-10-CM | POA: Insufficient documentation

## 2014-06-26 DIAGNOSIS — I10 Essential (primary) hypertension: Secondary | ICD-10-CM | POA: Insufficient documentation

## 2014-06-26 DIAGNOSIS — Z8546 Personal history of malignant neoplasm of prostate: Secondary | ICD-10-CM | POA: Insufficient documentation

## 2014-06-26 DIAGNOSIS — E785 Hyperlipidemia, unspecified: Secondary | ICD-10-CM | POA: Diagnosis not present

## 2014-06-26 DIAGNOSIS — Z7982 Long term (current) use of aspirin: Secondary | ICD-10-CM | POA: Insufficient documentation

## 2014-06-26 DIAGNOSIS — H409 Unspecified glaucoma: Secondary | ICD-10-CM | POA: Diagnosis not present

## 2014-06-26 LAB — CBC WITH DIFFERENTIAL/PLATELET
BASOS PCT: 0 % (ref 0–1)
Basophils Absolute: 0 10*3/uL (ref 0.0–0.1)
EOS ABS: 0.1 10*3/uL (ref 0.0–0.7)
Eosinophils Relative: 3 % (ref 0–5)
HCT: 38.9 % — ABNORMAL LOW (ref 39.0–52.0)
Hemoglobin: 13 g/dL (ref 13.0–17.0)
Lymphocytes Relative: 22 % (ref 12–46)
Lymphs Abs: 0.7 10*3/uL (ref 0.7–4.0)
MCH: 33.3 pg (ref 26.0–34.0)
MCHC: 33.4 g/dL (ref 30.0–36.0)
MCV: 99.7 fL (ref 78.0–100.0)
Monocytes Absolute: 0.4 10*3/uL (ref 0.1–1.0)
Monocytes Relative: 13 % — ABNORMAL HIGH (ref 3–12)
NEUTROS PCT: 62 % (ref 43–77)
Neutro Abs: 2 10*3/uL (ref 1.7–7.7)
PLATELETS: 133 10*3/uL — AB (ref 150–400)
RBC: 3.9 MIL/uL — ABNORMAL LOW (ref 4.22–5.81)
RDW: 12.9 % (ref 11.5–15.5)
WBC: 3.2 10*3/uL — ABNORMAL LOW (ref 4.0–10.5)

## 2014-06-26 LAB — URINALYSIS, ROUTINE W REFLEX MICROSCOPIC
Bilirubin Urine: NEGATIVE
GLUCOSE, UA: NEGATIVE mg/dL
Ketones, ur: NEGATIVE mg/dL
Leukocytes, UA: NEGATIVE
Nitrite: POSITIVE — AB
Protein, ur: NEGATIVE mg/dL
SPECIFIC GRAVITY, URINE: 1.024 (ref 1.005–1.030)
UROBILINOGEN UA: 0.2 mg/dL (ref 0.0–1.0)
pH: 5.5 (ref 5.0–8.0)

## 2014-06-26 LAB — COMPREHENSIVE METABOLIC PANEL
ALBUMIN: 3.9 g/dL (ref 3.5–5.0)
ALT: 27 U/L (ref 17–63)
AST: 28 U/L (ref 15–41)
Alkaline Phosphatase: 62 U/L (ref 38–126)
Anion gap: 5 (ref 5–15)
BUN: 18 mg/dL (ref 6–20)
CALCIUM: 9.6 mg/dL (ref 8.9–10.3)
CO2: 27 mmol/L (ref 22–32)
CREATININE: 1.9 mg/dL — AB (ref 0.61–1.24)
Chloride: 109 mmol/L (ref 101–111)
GFR calc Af Amer: 39 mL/min — ABNORMAL LOW (ref >60)
GFR calc non Af Amer: 33 mL/min — ABNORMAL LOW (ref >60)
Glucose, Bld: 108 mg/dL — ABNORMAL HIGH (ref 65–99)
Potassium: 5.2 mmol/L — ABNORMAL HIGH (ref 3.5–5.1)
Sodium: 141 mmol/L (ref 135–145)
Total Bilirubin: 0.8 mg/dL (ref 0.3–1.2)
Total Protein: 6.9 g/dL (ref 6.5–8.1)

## 2014-06-26 LAB — URINE MICROSCOPIC-ADD ON

## 2014-06-26 LAB — POC OCCULT BLOOD, ED: Fecal Occult Bld: NEGATIVE

## 2014-06-26 MED ORDER — POLYETHYLENE GLYCOL 3350 17 G PO PACK: 17.0000 g | PACK | Freq: Every day | ORAL | Status: DC

## 2014-06-26 MED ORDER — PEG 3350-KCL-NABCB-NACL-NASULF 236 G PO SOLR
4.0000 L | Freq: Once | ORAL | Status: DC
Start: 2014-06-26 — End: 2017-03-16

## 2014-06-26 MED ORDER — IOHEXOL 300 MG/ML  SOLN
25.0000 mL | INTRAMUSCULAR | Status: AC
  Administered 2014-06-26: 25 mL via ORAL

## 2014-06-26 NOTE — ED Notes (Signed)
Pt sts abd pain with distention x 2 weeks; pt sts has to take laxatives for BM; pt sts hx of prostate CA

## 2014-06-26 NOTE — Discharge Instructions (Signed)
Abdominal Pain Many things can cause abdominal pain. Usually, abdominal pain is not caused by a disease and will improve without treatment. It can often be observed and treated at home. Your health care provider will do a physical exam and possibly order blood tests and X-rays to help determine the seriousness of your pain. However, in many cases, more time must pass before a clear cause of the pain can be found. Before that point, your health care provider may not know if you need more testing or further treatment. HOME CARE INSTRUCTIONS  Monitor your abdominal pain for any changes. The following actions may help to alleviate any discomfort you are experiencing:  Only take over-the-counter or prescription medicines as directed by your health care provider.  Do not take laxatives unless directed to do so by your health care provider.  Try a clear liquid diet (broth, tea, or water) as directed by your health care provider. Slowly move to a bland diet as tolerated. SEEK MEDICAL CARE IF:  You have unexplained abdominal pain.  You have abdominal pain associated with nausea or diarrhea.  You have pain when you urinate or have a bowel movement.  You experience abdominal pain that wakes you in the night.  You have abdominal pain that is worsened or improved by eating food.  You have abdominal pain that is worsened with eating fatty foods.  You have a fever. SEEK IMMEDIATE MEDICAL CARE IF:   Your pain does not go away within 2 hours.  You keep throwing up (vomiting).  Your pain is felt only in portions of the abdomen, such as the right side or the left lower portion of the abdomen.  You pass bloody or black tarry stools. MAKE SURE YOU:  Understand these instructions.   Will watch your condition.   Will get help right away if you are not doing well or get worse.  Document Released: 09/29/2004 Document Revised: 12/25/2012 Document Reviewed: 08/29/2012 [facility] Patient Information  2015 [place], [place]. This information is not intended to replace advice given to you by your health care provider. Make sure you discuss any questions you have with your health care provider.   Constipation Constipation is when a person has fewer than three bowel movements a week, has difficulty having a bowel movement, or has stools that are dry, hard, or larger than normal. As people grow older, constipation is more common. If you try to fix constipation with medicines that make you have a bowel movement (laxatives), the problem may get worse. Long-term laxative use may cause the muscles of the colon to become weak. A low-fiber diet, not taking in enough fluids, and taking certain medicines may make constipation worse.  CAUSES   Certain medicines, such as antidepressants, pain medicine, iron supplements, antacids, and water pills.   Certain diseases, such as diabetes, irritable bowel syndrome (IBS), thyroid disease, or depression.   Not drinking enough water.   Not eating enough fiber-rich foods.   Stress or travel.   Lack of physical activity or exercise.   Ignoring the urge to have a bowel movement.   Using laxatives too much.  SIGNS AND SYMPTOMS   Having fewer than three bowel movements a week.   Straining to have a bowel movement.   Having stools that are hard, dry, or larger than normal.   Feeling full or bloated.   Pain in the lower abdomen.   Not feeling relief after having a bowel movement.  DIAGNOSIS  Your health care provider will  take a medical history and perform a physical exam. Further testing may be done for severe constipation. Some tests may include:  A barium enema X-ray to examine your rectum, colon, and, sometimes, your small intestine.   A sigmoidoscopy to examine your lower colon.   A colonoscopy to examine your entire colon. TREATMENT  Treatment will depend on the severity of your constipation and what is causing it. Some dietary  treatments include drinking more fluids and eating more fiber-rich foods. Lifestyle treatments may include regular exercise. If these diet and lifestyle recommendations do not help, your health care provider may recommend taking over-the-counter laxative medicines to help you have bowel movements. Prescription medicines may be prescribed if over-the-counter medicines do not work.  HOME CARE INSTRUCTIONS   Eat foods that have a lot of fiber, such as fruits, vegetables, whole grains, and beans.  Limit foods high in fat and processed sugars, such as french fries, hamburgers, cookies, candies, and soda.   A fiber supplement may be added to your diet if you cannot get enough fiber from foods.   Drink enough fluids to keep your urine clear or pale yellow.   Exercise regularly or as directed by your health care provider.   Go to the restroom when you have the urge to go. Do not hold it.   Only take over-the-counter or prescription medicines as directed by your health care provider. Do not take other medicines for constipation without talking to your health care provider first.  [place] IF:   You have bright red blood in your stool.   Your constipation lasts for more than 4 days or gets worse.   You have abdominal or rectal pain.   You have thin, pencil-like stools.   You have unexplained weight loss. MAKE SURE YOU:   Understand these instructions.  Will watch your condition.  Will get help right away if you are not doing well or get worse. Document Released: 09/18/2003 Document Revised: 12/25/2012 Document Reviewed: 10/01/2012 [facility] Patient Information 2015 [place], [place]. This information is not intended to replace advice given to you by your health care provider. Make sure you discuss any questions you have with your health care provider.  Polyethylene Glycol powder What is this medicine? POLYETHYLENE GLYCOL 3350 (pol ee ETH i leen; GLYE col) powder  is a laxative used to treat constipation. It increases the amount of water in the stool. Bowel movements become easier and more frequent. This medicine may be used for other purposes; ask your health care provider or pharmacist if you have questions. COMMON BRAND NAME(S): GaviLax, GlycoLax, MiraLax, [employer] What should I tell my health care provider before I take this medicine? They need to know if you have any of these conditions: -a history of blockage of the stomach or intestine -current abdomen distension or pain -difficulty swallowing -diverticulitis, ulcerative colitis, or other chronic bowel disease -phenylketonuria -an unusual or allergic reaction to polyethylene glycol, other medicines, dyes, or preservatives -pregnant or trying to get pregnant -breast-feeding How should I use this medicine? Take this medicine by mouth. The bottle has a measuring cap that is marked with a line. Pour the powder into the cap up to the marked line (the dose is about 1 heaping tablespoon). Add the powder in the cap to a full glass (4 to 8 ounces or 120 to 240 ml) of water, juice, soda, coffee or tea. Mix the powder well. Drink the solution. Take exactly as directed. Do not take your  medicine more often than directed. Talk to your pediatrician regarding the use of this medicine in children. Special care may be needed. Overdosage: If you think you have taken too much of this medicine contact a poison control center or emergency room at once. NOTE: This medicine is only for you. Do not share this medicine with others. What if I miss a dose? If you miss a dose, take it as soon as you can. If it is almost time for your next dose, take only that dose. Do not take double or extra doses. What may interact with this medicine? Interactions are not expected. This list may not describe all possible interactions. Give your health care provider a list of all the medicines, herbs, non-prescription drugs, or dietary  supplements you use. Also tell them if you smoke, drink alcohol, or use illegal drugs. Some items may interact with your medicine. What should I watch for while using this medicine? Do not use for more than 2 weeks without advice from your doctor or health care professional. It can take 2 to 4 days to have a bowel movement and to experience improvement in constipation. See your health care professional for any changes in bowel habits, including constipation, that are severe or last longer than three weeks. Always take this medicine with plenty of water. What side effects may I notice from receiving this medicine? Side effects that you should report to your doctor or health care professional as soon as possible: -diarrhea -difficulty breathing -itching of the skin, hives, or skin rash -severe bloating, pain, or distension of the stomach -vomiting Side effects that usually do not require medical attention (report to your doctor or health care professional if they continue or are bothersome): -bloating or gas -lower abdominal discomfort or cramps -nausea This list may not describe all possible side effects. Call your doctor for medical advice about side effects. You may report side effects to FDA at 1-800-FDA-1088. Where should I keep my medicine? Keep out of the reach of children. Store between 15 and 30 degrees C (59 and 86 degrees F). Throw away any unused medicine after the expiration date. NOTE: This sheet is a summary. It may not cover all possible information. If you have questions about this medicine, talk to your doctor, pharmacist, or health care provider.  2015, [employer] Standard. (2007-07-23 16:50:45)

## 2014-06-26 NOTE — ED Provider Notes (Signed)
CSN: [id number]     Arrival date & time 06/26/14  1323 History   First MD Initiated Contact with Patient 06/26/14 1751     Chief Complaint  Patient presents with  . Abdominal Pain     (Consider location/radiation/quality/duration/timing/severity/associated sxs/prior Treatment) Patient is a 74 y.o. male presenting with abdominal pain. The history is provided by the patient.  Abdominal Pain He complains of problems with constipation and abdominal distension for the last 2 weeks. He states that he is unable to have a bowel movement without taking a laxity of. He relates that he has a constant sense of rectal urgency. When he strains at the end of the bowel movement, he passes a small amount of blood. There has been some intermittent nausea. Abdominal pain is relatively mild and he rates it at 3/10. He denies fever, chills, sweats. Of note, he has been taking hydrocodone-acetaminophen for pain. He is also under the care of a urologist for prostate cancer.   Past Medical History  Diagnosis Date  . Herniated disc   . Hypertension   . Renal calculi BILATERAL   . Chronic back pain   . History of TIA (transient ischemic attack) 2010--  NO RESIDUAL    NONE SINCE  . Dyslipidemia   . Bilateral flank pain     DUE TO KIDNEY STONES  . Weakness of both legs   . Eczema   . History of kidney stones   . Arthritis   . Frequency of urination   . Urgency of urination   . Nocturia   . Hematuria   . Anxiety   . Glaucoma PT STATES RX RAN OUT  . Glaucoma   . Prostate cancer 03/2012  . Elevated PSA 01/11/2012    25.77  . History of shingles   . History of radiation therapy 06/07/12-08/02/12    prostate, 7800 cGy/40 sessions/ 5600cGy pelvic lymph nodes/40 sessions   Past Surgical History  Procedure Laterality Date  . Percutaneous nephrolithotomy  12-10-2007  &  11-07-2005    RIGHT RENAL CALCULI  . Cysto/ bilateral retrograde pyelogram/ left ureterscopic stone extraction  11-14-2005    LEFT URETERAL  CALCULUS  . Cysto/ bilateral ureteral stents  11-06-2005  . Right ureteroscopic stone extraction  05-29-2003  . Right ureteral stent placement  02-10-2003  . Repair left inguinal hernia w/ mesh  12-23-2002  . Internal urethrotomy/ turp  12-23-2002    BPH W/ STRICTURE  . Cysto/ left retrograde pyelogram/  left stent placment  05-21-2011  . Extracorporeal shock wave lithotripsy    . Transthoracic echocardiogram  12-19-2008    LVSF NORMAL/ EF 16-96%/ GRADE I DIASTOLIC DYSFUNCTION/   . Cystoscopy w/ ureteral stent removal  06/15/2011    Procedure: CYSTOSCOPY WITH STENT REMOVAL;  Surgeon: Franchot Gallo, MD;  Location: [facility];  Service: Urology;  Laterality: Left;  . Ureteroscopy  06/15/2011    Procedure: URETEROSCOPY;  Surgeon: Franchot Gallo, MD;  Location: [facility];  Service: Urology;  Laterality: Bilateral;  . Biliary stent placement  06/15/2011    Procedure: BILIARY STENT PLACEMENT;  Surgeon: Franchot Gallo, MD;  Location: [facility];  Service: Urology;  Laterality: Bilateral;  . Transurethral resection of bladder  2004   History reviewed. No pertinent family history. History  Substance Use Topics  . Smoking status: Former Smoker -- 40 years    Types: Cigarettes    Quit date: 06/10/1990  . Smokeless tobacco: Never Used  . Alcohol Use: No  Review of Systems  Gastrointestinal: Positive for abdominal pain.  All other systems reviewed and are negative.     Allergies  Tape; Doxycycline hyclate; and Sulfa antibiotics  Home Medications   Prior to Admission medications   Medication Sig Start Date End Date Taking? Authorizing Provider  ALPRAZolam Duanne Moron) 0.5 MG tablet Take 0.5 mg by mouth daily as needed. Anxiety/sleep    Historical Provider, MD  amLODipine (NORVASC) 10 MG tablet Take 1 tablet (10 mg total) by mouth daily. 04/12/12   [name], MD  aspirin EC 81 MG EC tablet Take 1 tablet (81 mg total) by  mouth daily. 04/11/12   [name], MD  atenolol (TENORMIN) 50 MG tablet Take 1 tablet by mouth daily. 01/25/14   Historical Provider, MD  atorvastatin (LIPITOR) 40 MG tablet Take 1 tablet (40 mg total) by mouth daily at 6 PM. 04/11/12   [name], MD  Brinzolamide-Brimonidine [facility]) 1-0.2 % SUSP Place 1 drop into both eyes daily as needed (irritation).     Historical Provider, MD  hydrochlorothiazide (HYDRODIURIL) 25 MG tablet Take 1 tablet (25 mg total) by mouth daily. Patient not taking: Reported on 02/25/2014 04/11/12   [name], MD  HYDROcodone-acetaminophen [facility]) 10-325 MG per tablet Take 1 tablet by mouth 4 (four) times daily. 02/12/14   Historical Provider, MD  loratadine (CLARITIN) 10 MG tablet Take 1 tablet (10 mg total) by mouth daily. Patient not taking: Reported on 02/25/2014 04/11/12   [name], MD  ondansetron (ZOFRAN ODT) 4 MG disintegrating tablet 4mg  ODT q4 hours prn nausea/vomit 02/25/14   [name], MD  oxyCODONE-acetaminophen (PERCOCET/ROXICET) 5-325 MG per tablet Take 1-2 tablets by mouth every 4 (four) hours as needed for severe pain. 02/25/14   [name], MD  phenazopyridine (PYRIDIUM) 200 MG tablet Take 1 tablet (200 mg total) by mouth 3 (three) times daily as needed for pain. 02/25/14   [name], MD  potassium citrate (UROCIT-K) 10 MEQ (1080 MG) SR tablet Take 1 tablet (10 mEq total) by mouth 3 (three) times daily with meals. Patient taking differently: Take 10 mEq by mouth daily.  06/15/11   Franchot Gallo, MD  Travoprost, BAK Free, (TRAVATAN) 0.004 % SOLN ophthalmic solution Place 1 drop into both eyes 3 times/day as needed-between meals & bedtime (irritation).     Historical Provider, MD  vitamin C (ASCORBIC ACID) 500 MG tablet Take 500 mg by mouth daily.    Historical Provider, MD   BP 171/87 mmHg  Pulse 71  Temp(Src) 98.7 F (37.1 C) (Oral)  Resp 18  SpO2 98% Physical Exam  Nursing note and vitals  reviewed.  74 year old male, resting comfortably and in no acute distress. Vital signs are significant for hypertension Oxygen saturation is 98%, which is normal. Head is normocephalic and atraumatic. PERRLA, EOMI. Oropharynx is clear. Neck is nontender and supple without adenopathy or JVD. Back is nontender and there is no CVA tenderness. Lungs are clear without rales, wheezes, or rhonchi. Chest is nontender. Heart has regular rate and rhythm without murmur. Abdomen is distended, soft, nontender without masses or hepatosplenomegaly and peristalsis is normoactive. Rectal: Normal sphincter tone, no impaction. Prostate is somewhat indurated but nontender. Very little stool present but of normal color. Extremities have no cyanosis or edema, full range of motion is present. Skin is warm and dry without rash. Neurologic: Mental status is normal, cranial nerves are intact, there are no motor or sensory deficits.  ED Course  Procedures (including critical care time) Labs Review Results for orders placed or performed during the hospital encounter of 06/26/14  CBC with Differential  Result Value Ref Range   WBC 3.2 (L) 4.0 - 10.5 K/uL   RBC 3.90 (L) 4.22 - 5.81 MIL/uL   Hemoglobin 13.0 13.0 - 17.0 g/dL   HCT 38.9 (L) 39.0 - 52.0 %   MCV 99.7 78.0 - 100.0 fL   MCH 33.3 26.0 - 34.0 pg   MCHC 33.4 30.0 - 36.0 g/dL   RDW 12.9 11.5 - 15.5 %   Platelets 133 (L) 150 - 400 K/uL   Neutrophils Relative % 62 43 - 77 %   Neutro Abs 2.0 1.7 - 7.7 K/uL   Lymphocytes Relative 22 12 - 46 %   Lymphs Abs 0.7 0.7 - 4.0 K/uL   Monocytes Relative 13 (H) 3 - 12 %   Monocytes Absolute 0.4 0.1 - 1.0 K/uL   Eosinophils Relative 3 0 - 5 %   Eosinophils Absolute 0.1 0.0 - 0.7 K/uL   Basophils Relative 0 0 - 1 %   Basophils Absolute 0.0 0.0 - 0.1 K/uL  Comprehensive metabolic panel  Result Value Ref Range   Sodium 141 135 - 145 mmol/L   Potassium 5.2 (H) 3.5 - 5.1 mmol/L   Chloride 109 101 - 111 mmol/L   CO2  27 22 - 32 mmol/L   Glucose, Bld 108 (H) 65 - 99 mg/dL   BUN 18 6 - 20 mg/dL   Creatinine, Ser 1.90 (H) 0.61 - 1.24 mg/dL   Calcium 9.6 8.9 - 10.3 mg/dL   Total Protein 6.9 6.5 - 8.1 g/dL   Albumin 3.9 3.5 - 5.0 g/dL   AST 28 15 - 41 U/L   ALT 27 17 - 63 U/L   Alkaline Phosphatase 62 38 - 126 U/L   Total Bilirubin 0.8 0.3 - 1.2 mg/dL   GFR calc non Af Amer 33 (L) >60 mL/min   GFR calc Af Amer 39 (L) >60 mL/min   Anion gap 5 5 - 15  Urinalysis, Routine w reflex microscopic (not at [facility])  Result Value Ref Range   Color, Urine AMBER (A) YELLOW   APPearance CLEAR CLEAR   Specific Gravity, Urine 1.024 1.005 - 1.030   pH 5.5 5.0 - 8.0   Glucose, UA NEGATIVE NEGATIVE mg/dL   Hgb urine dipstick MODERATE (A) NEGATIVE   Bilirubin Urine NEGATIVE NEGATIVE   Ketones, ur NEGATIVE NEGATIVE mg/dL   Protein, ur NEGATIVE NEGATIVE mg/dL   Urobilinogen, UA 0.2 0.0 - 1.0 mg/dL   Nitrite POSITIVE (A) NEGATIVE   Leukocytes, UA NEGATIVE NEGATIVE  Urine microscopic-add on  Result Value Ref Range   Squamous Epithelial / LPF RARE RARE   WBC, UA 0-2 <3 WBC/hpf   RBC / HPF 7-10 <3 RBC/hpf   Bacteria, UA RARE RARE   Urine-Other MUCOUS PRESENT   POC occult blood, ED Provider will collect  Result Value Ref Range   Fecal Occult Bld NEGATIVE NEGATIVE   Imaging Review Ct Abdomen Pelvis Wo Contrast  06/26/2014   CLINICAL DATA:  Mid abdominal pain and nausea for 2 weeks.  EXAM: CT ABDOMEN AND PELVIS WITHOUT CONTRAST  TECHNIQUE: Multidetector CT imaging of the abdomen and pelvis was performed following the standard protocol without IV contrast.  COMPARISON:  CT scan of February 25, 2014.  FINDINGS: Visualized lung bases appear normal. No significant osseous abnormality is noted.  No gallstones are noted. Stable hepatic cysts are noted.  The spleen and pancreas appear normal. Adrenal glands appear normal. Stable right renal atrophy is noted. 7 mm nonobstructive calculus is noted in lower pole collecting system of  right kidney. No hydronephrosis or renal obstruction is noted. No ureteral calculi are noted. Atherosclerosis of abdominal aorta is noted without aneurysm formation. Mild fat containing periumbilical hernia is noted. There is no evidence of bowel obstruction. The appendix appears normal. Mild fat containing right inguinal hernia is noted. No abnormal fluid collection is noted. Urinary bladder appears normal. Status post prostatic brachytherapy seed placement. No significant adenopathy is noted.  IMPRESSION: Stable hepatic cysts.  Atherosclerosis of abdominal aorta without aneurysm formation.  Nonobstructive right renal calculus. No hydronephrosis or renal obstruction is noted.  Stable mild fat containing periumbilical hernia.  Stable mild fat containing right inguinal hernia.   Electronically Signed   By: Marijo Conception, M.D.   On: 06/26/2014 22:00   Dg Abd Acute W/chest  06/26/2014   CLINICAL DATA:  Abdominal pain and blood in stool for a few days. Initial encounter.  EXAM: DG ABDOMEN ACUTE W/ 1V CHEST  COMPARISON:  CT abdomen and pelvis 02/25/2014. Single view of the abdomen 06/24/2013. PA and lateral chest 04/10/2012.  FINDINGS: Single view of the chest demonstrates clear lungs and normal heart size. No pneumothorax or pleural effusion.  Two views of the abdomen show no free intraperitoneal air. The bowel gas pattern is nonobstructive. No unexpected abdominal calcification is seen.  IMPRESSION: Negative examination.   Electronically Signed   By: Inge Rise M.D.   On: 06/26/2014 14:58   Images viewed by me.  MDM   Final diagnoses:  Abdominal pain, unspecified abdominal location  Constipation due to opioid therapy    Abdominal distention, constipation, pain. I'm suspicious that this is related to his narcotic use. Plain x-rays were unremarkable and he is being sent for CT of abdomen and pelvis. 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 Of note, urinalysis does have positive nitrite but with only rare bacteria and 0-2 WBCs. He does not clinically have a UTI. Urine will be sent for culture but antibody will not be started unless culture comes back positive.  CT is read as unremarkable by radiologist. However, on review, he is noted to have stool throughout the colon. I believe that this is actually the cause of his pain and what is related to his narcotic use. He is discharged with prescription for polyethylene glycol to initially clear his system and then for ongoing use to treat opiate-induced constipation. Follow-up with PCP in one week.  Delora Fuel, MD 14/97/02 [id number]

## 2014-06-26 NOTE — ED Notes (Signed)
Pt drinking oral contrast.

## 2014-06-29 LAB — URINE CULTURE

## 2014-06-30 NOTE — Progress Notes (Signed)
ED Antimicrobial Stewardship Positive Culture Follow Up   [name] is an 74 y.o. male who presented to [facility] on 06/26/2014 with a chief complaint of  Chief Complaint  Patient presents with  . Abdominal Pain    Recent Results (from the past 720 hour(s))  Urine culture     Status: None   Collection Time: 06/26/14  5:39 PM  Result Value Ref Range Status   Specimen Description URINE, RANDOM  Final   Special Requests NONE  Final   Culture   Final    30,000 COLONIES/mL ESCHERICHIA COLI Confirmed Extended Spectrum Beta-Lactamase Producer (ESBL)    Report Status 06/29/2014 FINAL  Final   Organism ID, Bacteria ESCHERICHIA COLI  Final      Susceptibility   Escherichia coli - MIC*    AMPICILLIN >=32 RESISTANT Resistant     CEFAZOLIN >=64 RESISTANT Resistant     CEFTRIAXONE 8 RESISTANT Resistant     CIPROFLOXACIN >=4 RESISTANT Resistant     GENTAMICIN <=1 SENSITIVE Sensitive     IMIPENEM <=0.25 SENSITIVE Sensitive     NITROFURANTOIN <=16 SENSITIVE Sensitive     TRIMETH/SULFA <=20 SENSITIVE Sensitive     AMPICILLIN/SULBACTAM >=32 RESISTANT Resistant     PIP/TAZO <=4 SENSITIVE Sensitive     * 30,000 COLONIES/mL ESCHERICHIA COLI    No dysuria. UA only positive for nitrite, Urine micro no abnormalities.  Colony count is below threshold for treatment.  This represents asymptomatic bacteriuria and no treatment is required.  ED Provider: Montine Circle, PA-C   Norva Riffle 06/30/2014, 10:46 AM Infectious Diseases Pharmacist Phone# [phone]

## 2014-08-17 ENCOUNTER — Emergency Department (HOSPITAL_COMMUNITY)
Admission: EM | Admit: 2014-08-17 | Discharge: 2014-08-17 | Disposition: A | Discharge status: Home / Self Care | Payer: Medicare Other | Attending: Emergency Medicine | Admitting: Emergency Medicine

## 2014-08-17 ENCOUNTER — Encounter (HOSPITAL_COMMUNITY): Payer: Self-pay | Admitting: Emergency Medicine

## 2014-08-17 DIAGNOSIS — F419 Anxiety disorder, unspecified: Secondary | ICD-10-CM | POA: Diagnosis not present

## 2014-08-17 DIAGNOSIS — G8929 Other chronic pain: Secondary | ICD-10-CM | POA: Insufficient documentation

## 2014-08-17 DIAGNOSIS — Z8619 Personal history of other infectious and parasitic diseases: Secondary | ICD-10-CM | POA: Diagnosis not present

## 2014-08-17 DIAGNOSIS — N390 Urinary tract infection, site not specified: Secondary | ICD-10-CM | POA: Diagnosis not present

## 2014-08-17 DIAGNOSIS — Z87891 Personal history of nicotine dependence: Secondary | ICD-10-CM | POA: Insufficient documentation

## 2014-08-17 DIAGNOSIS — Z8673 Personal history of transient ischemic attack (TIA), and cerebral infarction without residual deficits: Secondary | ICD-10-CM | POA: Diagnosis not present

## 2014-08-17 DIAGNOSIS — Z872 Personal history of diseases of the skin and subcutaneous tissue: Secondary | ICD-10-CM | POA: Insufficient documentation

## 2014-08-17 DIAGNOSIS — Z87442 Personal history of urinary calculi: Secondary | ICD-10-CM | POA: Diagnosis not present

## 2014-08-17 DIAGNOSIS — Z79899 Other long term (current) drug therapy: Secondary | ICD-10-CM | POA: Insufficient documentation

## 2014-08-17 DIAGNOSIS — I10 Essential (primary) hypertension: Secondary | ICD-10-CM | POA: Diagnosis not present

## 2014-08-17 DIAGNOSIS — Z7982 Long term (current) use of aspirin: Secondary | ICD-10-CM | POA: Diagnosis not present

## 2014-08-17 DIAGNOSIS — E785 Hyperlipidemia, unspecified: Secondary | ICD-10-CM | POA: Insufficient documentation

## 2014-08-17 DIAGNOSIS — Z8546 Personal history of malignant neoplasm of prostate: Secondary | ICD-10-CM | POA: Diagnosis not present

## 2014-08-17 DIAGNOSIS — R319 Hematuria, unspecified: Secondary | ICD-10-CM | POA: Diagnosis present

## 2014-08-17 LAB — URINALYSIS, ROUTINE W REFLEX MICROSCOPIC
BILIRUBIN URINE: NEGATIVE
Glucose, UA: NEGATIVE mg/dL
Ketones, ur: NEGATIVE mg/dL
NITRITE: NEGATIVE
PH: 5.5 (ref 5.0–8.0)
Protein, ur: 30 mg/dL — AB
Specific Gravity, Urine: 1.01 (ref 1.005–1.030)
UROBILINOGEN UA: 0.2 mg/dL (ref 0.0–1.0)

## 2014-08-17 LAB — CBC WITH DIFFERENTIAL/PLATELET
BASOS PCT: 0 % (ref 0–1)
Basophils Absolute: 0 10*3/uL (ref 0.0–0.1)
EOS ABS: 0.1 10*3/uL (ref 0.0–0.7)
Eosinophils Relative: 2 % (ref 0–5)
HEMATOCRIT: 39 % (ref 39.0–52.0)
HEMOGLOBIN: 13 g/dL (ref 13.0–17.0)
Lymphocytes Relative: 12 % (ref 12–46)
Lymphs Abs: 0.5 10*3/uL — ABNORMAL LOW (ref 0.7–4.0)
MCH: 33.1 pg (ref 26.0–34.0)
MCHC: 33.3 g/dL (ref 30.0–36.0)
MCV: 99.2 fL (ref 78.0–100.0)
MONOS PCT: 11 % (ref 3–12)
Monocytes Absolute: 0.4 10*3/uL (ref 0.1–1.0)
Neutro Abs: 3.1 10*3/uL (ref 1.7–7.7)
Neutrophils Relative %: 76 % (ref 43–77)
Platelets: 116 10*3/uL — ABNORMAL LOW (ref 150–400)
RBC: 3.93 MIL/uL — AB (ref 4.22–5.81)
RDW: 12.2 % (ref 11.5–15.5)
WBC: 4.1 10*3/uL (ref 4.0–10.5)

## 2014-08-17 LAB — BASIC METABOLIC PANEL
Anion gap: 7 (ref 5–15)
BUN: 20 mg/dL (ref 6–20)
CALCIUM: 9.3 mg/dL (ref 8.9–10.3)
CO2: 26 mmol/L (ref 22–32)
Chloride: 105 mmol/L (ref 101–111)
Creatinine, Ser: 1.74 mg/dL — ABNORMAL HIGH (ref 0.61–1.24)
GFR calc Af Amer: 43 mL/min — ABNORMAL LOW (ref >60)
GFR calc non Af Amer: 37 mL/min — ABNORMAL LOW (ref >60)
GLUCOSE: 98 mg/dL (ref 65–99)
Potassium: 4.3 mmol/L (ref 3.5–5.1)
SODIUM: 138 mmol/L (ref 135–145)

## 2014-08-17 LAB — URINE MICROSCOPIC-ADD ON

## 2014-08-17 MED ORDER — HYDROCODONE-ACETAMINOPHEN 5-325 MG PO TABS
2.0000 | ORAL_TABLET | Freq: Once | ORAL | Status: AC
  Administered 2014-08-17: 2 via ORAL
  Filled 2014-08-17: qty 2

## 2014-08-17 MED ORDER — CEPHALEXIN 500 MG PO CAPS: 500.0000 mg | ORAL_CAPSULE | Freq: Four times a day (QID) | ORAL | Status: DC

## 2014-08-17 MED ORDER — SODIUM CHLORIDE 0.9 % IV SOLN
Freq: Once | INTRAVENOUS | Status: AC
  Administered 2014-08-17: 13:00:00 via INTRAVENOUS

## 2014-08-17 MED ORDER — PHENAZOPYRIDINE HCL 200 MG PO TABS: 200.0000 mg | ORAL_TABLET | Freq: Three times a day (TID) | ORAL | Status: DC

## 2014-08-17 MED ORDER — DEXTROSE 5 % IV SOLN
1.0000 g | Freq: Once | INTRAVENOUS | Status: AC
  Administered 2014-08-17: 1 g via INTRAVENOUS
  Filled 2014-08-17: qty 10

## 2014-08-17 NOTE — ED Notes (Signed)
Questions r/t dc were denied. Pt is ambulatory. Spouse will be transporting pt home. He is a&ox4

## 2014-08-17 NOTE — ED Provider Notes (Signed)
CSN: [id number]     Arrival date & time 08/17/14  1245 History   First MD Initiated Contact with Patient 08/17/14 1000     Chief Complaint  Patient presents with  . Hematuria     (Consider location/radiation/quality/duration/timing/severity/associated sxs/prior Treatment) The history is provided by the patient.     Pt with hx kidney stones, prostate cancer s/p TURP and radiation p/w hematuria and passing large blood clots from the penis that began today.  Associated pain at the tip of his penis that is worse with urination.  Has had urinary frequency for several months, started on medication by Dr Diona Fanti, urology, without improvement.  Pt also notes 1 month of low back pain with radiation around both sides of the lower abdomen and several months of sense of bowel urgency (tenesmus) that occurs 12-13 times daily, has only 2-3 BM daily.  Was seen in ED 06/2014 with negative CT abd/pelvis, noted to have constipation thought to be caused by narcotic use (norco for chronic back pain).  Urine culture at that time grew only 30,000 colonies e.coli.  Pt has never had colonoscopy.  Has also had episode of chills two days ago, myalgias today.  Denies N/V, testicular pain or swelling, penile discharge that is not blood or urine.    Urologist Dr Diona Fanti PCP Ricke Hey   Past Medical History  Diagnosis Date  . Herniated disc   . Hypertension   . Renal calculi BILATERAL   . Chronic back pain   . History of TIA (transient ischemic attack) 2010--  NO RESIDUAL    NONE SINCE  . Dyslipidemia   . Bilateral flank pain     DUE TO KIDNEY STONES  . Weakness of both legs   . Eczema   . History of kidney stones   . Arthritis   . Frequency of urination   . Urgency of urination   . Nocturia   . Hematuria   . Anxiety   . Glaucoma PT STATES RX RAN OUT  . Glaucoma   . Prostate cancer 03/2012  . Elevated PSA 01/11/2012    25.77  . History of shingles   . History of radiation therapy 06/07/12-08/02/12     prostate, 7800 cGy/40 sessions/ 5600cGy pelvic lymph nodes/40 sessions   Past Surgical History  Procedure Laterality Date  . Percutaneous nephrolithotomy  12-10-2007  &  11-07-2005    RIGHT RENAL CALCULI  . Cysto/ bilateral retrograde pyelogram/ left ureterscopic stone extraction  11-14-2005    LEFT URETERAL CALCULUS  . Cysto/ bilateral ureteral stents  11-06-2005  . Right ureteroscopic stone extraction  05-29-2003  . Right ureteral stent placement  02-10-2003  . Repair left inguinal hernia w/ mesh  12-23-2002  . Internal urethrotomy/ turp  12-23-2002    BPH W/ STRICTURE  . Cysto/ left retrograde pyelogram/  left stent placment  05-21-2011  . Extracorporeal shock wave lithotripsy    . Transthoracic echocardiogram  12-19-2008    LVSF NORMAL/ EF 80-99%/ GRADE I DIASTOLIC DYSFUNCTION/   . Cystoscopy w/ ureteral stent removal  06/15/2011    Procedure: CYSTOSCOPY WITH STENT REMOVAL;  Surgeon: Franchot Gallo, MD;  Location: [facility];  Service: Urology;  Laterality: Left;  . Ureteroscopy  06/15/2011    Procedure: URETEROSCOPY;  Surgeon: Franchot Gallo, MD;  Location: [facility];  Service: Urology;  Laterality: Bilateral;  . Biliary stent placement  06/15/2011    Procedure: BILIARY STENT PLACEMENT;  Surgeon: Franchot Gallo, MD;  Location: [facility] [place];  Service: Urology;  Laterality: Bilateral;  . Transurethral resection of bladder  2004   History reviewed. No pertinent family history. Social History  Substance Use Topics  . Smoking status: Former Smoker -- 40 years    Types: Cigarettes    Quit date: 06/10/1990  . Smokeless tobacco: Never Used  . Alcohol Use: No    Review of Systems  All other systems reviewed and are negative.     Allergies  Tape; Doxycycline hyclate; and Sulfa antibiotics  Home Medications   Prior to Admission medications   Medication Sig Start Date End Date Taking? Authorizing Provider  ALPRAZolam  Duanne Moron) 0.5 MG tablet Take 0.5 mg by mouth daily as needed. Anxiety/sleep    Historical Provider, MD  amLODipine (NORVASC) 10 MG tablet Take 1 tablet (10 mg total) by mouth daily. Patient not taking: Reported on 06/26/2014 04/12/12   [name], MD  aspirin EC 81 MG EC tablet Take 1 tablet (81 mg total) by mouth daily. Patient not taking: Reported on 06/26/2014 04/11/12   [name], MD  atenolol (TENORMIN) 50 MG tablet Take 1 tablet by mouth daily. 01/25/14   Historical Provider, MD  atorvastatin (LIPITOR) 40 MG tablet Take 1 tablet (40 mg total) by mouth daily at 6 PM. Patient not taking: Reported on 06/26/2014 04/11/12   [name], MD  b complex vitamins tablet Take 1 tablet by mouth daily.    Historical Provider, MD  Brinzolamide-Brimonidine [facility]) 1-0.2 % SUSP Place 1 drop into both eyes daily as needed (irritation).     Historical Provider, MD  ciprofloxacin (CIPRO) 500 MG tablet Take 500 mg by mouth 2 (two) times daily. Started 06/22/14, for 7 days ending 06/29/14 06/22/14   Historical Provider, MD  hydrochlorothiazide (HYDRODIURIL) 25 MG tablet Take 1 tablet (25 mg total) by mouth daily. Patient not taking: Reported on 02/25/2014 04/11/12   [name], MD  HYDROcodone-acetaminophen [facility]) 10-325 MG per tablet Take 1 tablet by mouth 4 (four) times daily. 02/12/14   Historical Provider, MD  ibuprofen (ADVIL,MOTRIN) 200 MG tablet Take 400 mg by mouth every 6 (six) hours as needed.    Historical Provider, MD  loratadine (CLARITIN) 10 MG tablet Take 1 tablet (10 mg total) by mouth daily. Patient not taking: Reported on 02/25/2014 04/11/12   [name], MD  mirabegron ER (MYRBETRIQ) 25 MG TB24 tablet Take 25 mg by mouth daily.    Historical Provider, MD  ondansetron (ZOFRAN ODT) 4 MG disintegrating tablet 4mg  ODT q4 hours prn nausea/vomit Patient not taking: Reported on 06/26/2014 02/25/14   [name], MD  oxyCODONE-acetaminophen (PERCOCET/ROXICET) 5-325  MG per tablet Take 1-2 tablets by mouth every 4 (four) hours as needed for severe pain. 02/25/14   [name], MD  phenazopyridine (PYRIDIUM) 200 MG tablet Take 1 tablet (200 mg total) by mouth 3 (three) times daily as needed for pain. 02/25/14   [name], MD  polyethylene glycol (GOLYTELY) 236 G solution Take 4,000 mLs by mouth once. Drink 8 ounces every 15 minutes until you are passing clear liquid. 1/51/76   Delora Fuel, MD  polyethylene glycol [facility] Floria Raveling) packet Take 17 g by mouth daily. 1/60/73   Delora Fuel, MD  potassium citrate (UROCIT-K) 10 MEQ (1080 MG) SR tablet Take 1 tablet (10 mEq total) by mouth 3 (three) times daily with meals. Patient taking differently: Take 10 mEq by mouth daily.  06/15/11   Franchot Gallo, MD   BP  154/100 mmHg  Pulse 87  Temp(Src) 98.1 F (36.7 C) (Oral)  Resp 16  SpO2 100% Physical Exam  Constitutional: He appears well-developed and well-nourished. No distress.  HENT:  Head: Normocephalic and atraumatic.  Neck: Neck supple.  Cardiovascular: Normal rate and regular rhythm.   Pulmonary/Chest: Effort normal and breath sounds normal. No respiratory distress. He has no wheezes. He has no rales.  Abdominal: Soft. He exhibits no distension. There is no tenderness. There is no rebound and no guarding.  Pt appears uncomfortable with palpation of LLQ and suprapubic areas - pt states this makes him feel like he is going to urinate  Genitourinary: Right testis shows no mass and no tenderness. Left testis shows no mass and no tenderness. Uncircumcised.  Tiny erosion at tip of penis.       Musculoskeletal: He exhibits no edema.  Neurological: He is alert. He exhibits normal muscle tone.  Skin: He is not diaphoretic.  Nursing note and vitals reviewed.   ED Course  Procedures (including critical care time) Labs Review Labs Reviewed  BASIC METABOLIC PANEL - Abnormal; Notable for the following:    Creatinine, Ser 1.74 (*)    GFR calc non  Af Amer 37 (*)    GFR calc Af Amer 43 (*)    All other components within normal limits  CBC WITH DIFFERENTIAL/PLATELET - Abnormal; Notable for the following:    RBC 3.93 (*)    Platelets 116 (*)    Lymphs Abs 0.5 (*)    All other components within normal limits  URINALYSIS, ROUTINE W REFLEX MICROSCOPIC (NOT AT [facility]) - Abnormal; Notable for the following:    APPearance CLOUDY (*)    Hgb urine dipstick LARGE (*)    Protein, ur 30 (*)    Leukocytes, UA LARGE (*)    All other components within normal limits  URINE MICROSCOPIC-ADD ON - Abnormal; Notable for the following:    Bacteria, UA MANY (*)    All other components within normal limits  URINE CULTURE    Imaging Review No results found. I, Karen Kinnard, personally reviewed and evaluated these images and lab results as part of my medical decision-making.   EKG Interpretation None       1:58 PM Discussed pt with Dr Rex Kras who will also see the patient.   MDM   Final diagnoses:  UTI (lower urinary tract infection)    Afebrile, nontoxic patient with hematuria and dysuria today.  He had chronic abdominal and back pain that is unchanged.  UA appears infected.  Pt given rocephin in ED.  Urine sent for culture. No symptoms concerning for ureteral stones.  Urine is yellow, no large clots noted, doubt urinary outlet obstruction or retention.  Pt also seen by Dr Rex Kras who agrees with workup and plan.  D/C home with keflex, pyridium, urology follow up.  Discussed result, findings, treatment, and follow up  with patient.  Pt given return precautions.  Pt verbalizes understanding and agrees with plan.        [name], PA-C 08/17/14 [place], MD 08/17/14 [phone]

## 2014-08-17 NOTE — Discharge Instructions (Signed)
Read the information below.  Use the prescribed medication as directed.  Please discuss all new medications with your pharmacist.  You may return to the Emergency Department at any time for worsening condition or any new symptoms that concern you.   If you develop high fevers, worsening abdominal pain, uncontrolled vomiting, or are unable to tolerate fluids by mouth, return to the ER for a recheck.     Urinary Tract Infection A urinary tract infection (UTI) can occur any place along the urinary tract. The tract includes the kidneys, ureters, bladder, and urethra. A type of germ called bacteria often causes a UTI. UTIs are often helped with antibiotic medicine.  HOME CARE   If given, take antibiotics as told by your doctor. Finish them even if you start to feel better.  Drink enough fluids to keep your pee (urine) clear or pale yellow.  Avoid tea, drinks with caffeine, and bubbly (carbonated) drinks.  Pee often. Avoid holding your pee in for a long time.  Pee before and after having sex (intercourse).  Wipe from front to back after you poop (bowel movement) if you are a woman. Use each tissue only once. GET HELP RIGHT AWAY IF:   You have back pain.  You have lower belly (abdominal) pain.  You have chills.  You feel sick to your stomach (nauseous).  You throw up (vomit).  Your burning or discomfort with peeing does not go away.  You have a fever.  Your symptoms are not better in 3 days. MAKE SURE YOU:   Understand these instructions.  Will watch your condition.  Will get help right away if you are not doing well or get worse. Document Released: 06/08/2007 Document Revised: 09/14/2011 Document Reviewed: 07/21/2011 [facility] Patient Information 2015 [place], [place]. This information is not intended to replace advice given to you by your health care provider. Make sure you discuss any questions you have with your health care provider.

## 2014-08-17 NOTE — ED Notes (Addendum)
Pt began having bloody, blood clots in his urine beginning last night. He has some left flank pain which he is unsure if it is related or not.  He states that urination is very painful.  ? Fever, does have chills, denies nausea and vomiting.  He has had kidney stones in the past.  Dr. Diona Fanti is his urologist.

## 2014-08-17 NOTE — ED Notes (Addendum)
Pt reports hematuria and dysuria for past 2 weeks. Hx of kidney stones, but no flank pain. Urinated just prior to coming to triage. Was given abx 3 days ago for possible UTI.

## 2014-08-20 LAB — URINE CULTURE: Culture: 70000

## 2014-08-21 ENCOUNTER — Telehealth (HOSPITAL_BASED_OUTPATIENT_CLINIC_OR_DEPARTMENT_OTHER): Payer: Self-pay | Admitting: Emergency Medicine

## 2014-08-21 NOTE — Progress Notes (Signed)
ED Antimicrobial Stewardship Positive Culture Follow Up   [name] is an 74 y.o. male who presented to [facility] on 08/17/2014 with a chief complaint of  Chief Complaint  Patient presents with  . Hematuria    Recent Results (from the past 720 hour(s))  Urine culture     Status: None   Collection Time: 08/17/14 10:30 AM  Result Value Ref Range Status   Specimen Description URINE, RANDOM  Final   Special Requests NONE  Final   Culture   Final    70,000 COLONIES/ml ESCHERICHIA COLI Confirmed Extended Spectrum Beta-Lactamase Producer (ESBL) Performed at [facility]    Report Status 08/20/2014 FINAL  Final   Organism ID, Bacteria ESCHERICHIA COLI  Final      Susceptibility   Escherichia coli - MIC*    AMPICILLIN >=32 RESISTANT Resistant     CEFAZOLIN >=64 RESISTANT Resistant     CEFTRIAXONE 8 RESISTANT Resistant     CIPROFLOXACIN >=4 RESISTANT Resistant     GENTAMICIN <=1 SENSITIVE Sensitive     IMIPENEM <=0.25 SENSITIVE Sensitive     NITROFURANTOIN <=16 SENSITIVE Sensitive     TRIMETH/SULFA <=20 SENSITIVE Sensitive     AMPICILLIN/SULBACTAM 16 INTERMEDIATE Intermediate     PIP/TAZO <=4 SENSITIVE Sensitive     * 70,000 COLONIES/ml ESCHERICHIA COLI    [x]  Treated with cephalexin, organism resistant to prescribed antimicrobial  New antibiotic prescription: Fosfomycin 3g PO every 3 days for 3 total doses.  ED Provider: [name], PA-C  [name] 08/21/2014, 8:56 AM Infectious Diseases Pharmacist Phone# [phone]

## 2014-08-22 ENCOUNTER — Telehealth (HOSPITAL_BASED_OUTPATIENT_CLINIC_OR_DEPARTMENT_OTHER): Payer: Self-pay | Admitting: Emergency Medicine

## 2014-08-22 NOTE — Telephone Encounter (Signed)
Post ED Visit - Positive Culture Follow-up: Successful Patient Follow-Up  Culture assessed and recommendations reviewed by: []  Heide Guile, Pharm.D., BCPS-AQ ID []  Alycia Rossetti, Pharm.D., BCPS []  [place], [place].D., BCPS, AAHIVP []  Legrand Como, Pharm.D., BCPS, AAHIVP []  Tegan Magsam, Pharm.D. []  Milus Glazier, [place].D Dimitri Ped PharmD.  Positive urine  Culture ESBL  []  Patient discharged without antimicrobial prescription and treatment is now indicated [x]  Organism is resistant to prescribed ED discharge antimicrobial []  Patient with positive blood cultures  Changes discussed with ED provider: [name] PA New antibiotic prescription stop Fosfomycin 3gram po every 3 days x 3 doses Called to [place] patient,08/22/14 [place] 08/22/2014, 2:09 PM

## 2014-09-24 ENCOUNTER — Encounter (HOSPITAL_COMMUNITY): Payer: Self-pay | Admitting: *Deleted

## 2014-09-24 ENCOUNTER — Emergency Department (HOSPITAL_COMMUNITY)
Admission: EM | Admit: 2014-09-24 | Discharge: 2014-09-24 | Disposition: A | Discharge status: Home / Self Care | Payer: Medicare Other | Attending: Emergency Medicine | Admitting: Emergency Medicine

## 2014-09-24 DIAGNOSIS — Z8673 Personal history of transient ischemic attack (TIA), and cerebral infarction without residual deficits: Secondary | ICD-10-CM | POA: Insufficient documentation

## 2014-09-24 DIAGNOSIS — Z8619 Personal history of other infectious and parasitic diseases: Secondary | ICD-10-CM | POA: Insufficient documentation

## 2014-09-24 DIAGNOSIS — Z8546 Personal history of malignant neoplasm of prostate: Secondary | ICD-10-CM | POA: Insufficient documentation

## 2014-09-24 DIAGNOSIS — Z872 Personal history of diseases of the skin and subcutaneous tissue: Secondary | ICD-10-CM | POA: Diagnosis not present

## 2014-09-24 DIAGNOSIS — F419 Anxiety disorder, unspecified: Secondary | ICD-10-CM | POA: Diagnosis not present

## 2014-09-24 DIAGNOSIS — R3915 Urgency of urination: Secondary | ICD-10-CM | POA: Insufficient documentation

## 2014-09-24 DIAGNOSIS — R350 Frequency of micturition: Secondary | ICD-10-CM | POA: Insufficient documentation

## 2014-09-24 DIAGNOSIS — Z87442 Personal history of urinary calculi: Secondary | ICD-10-CM | POA: Diagnosis not present

## 2014-09-24 DIAGNOSIS — G8929 Other chronic pain: Secondary | ICD-10-CM | POA: Diagnosis not present

## 2014-09-24 DIAGNOSIS — E785 Hyperlipidemia, unspecified: Secondary | ICD-10-CM | POA: Insufficient documentation

## 2014-09-24 DIAGNOSIS — Z79899 Other long term (current) drug therapy: Secondary | ICD-10-CM | POA: Diagnosis not present

## 2014-09-24 DIAGNOSIS — I10 Essential (primary) hypertension: Secondary | ICD-10-CM | POA: Diagnosis not present

## 2014-09-24 DIAGNOSIS — R300 Dysuria: Secondary | ICD-10-CM | POA: Diagnosis not present

## 2014-09-24 DIAGNOSIS — Z87891 Personal history of nicotine dependence: Secondary | ICD-10-CM | POA: Diagnosis not present

## 2014-09-24 DIAGNOSIS — R109 Unspecified abdominal pain: Secondary | ICD-10-CM | POA: Insufficient documentation

## 2014-09-24 LAB — URINALYSIS, ROUTINE W REFLEX MICROSCOPIC
BILIRUBIN URINE: NEGATIVE
Glucose, UA: NEGATIVE mg/dL
Hgb urine dipstick: NEGATIVE
Ketones, ur: NEGATIVE mg/dL
NITRITE: NEGATIVE
PH: 6 (ref 5.0–8.0)
Protein, ur: 30 mg/dL — AB
SPECIFIC GRAVITY, URINE: 1.026 (ref 1.005–1.030)
UROBILINOGEN UA: 0.2 mg/dL (ref 0.0–1.0)

## 2014-09-24 LAB — COMPREHENSIVE METABOLIC PANEL
ALT: 24 U/L (ref 17–63)
ANION GAP: 3 — AB (ref 5–15)
AST: 27 U/L (ref 15–41)
Albumin: 4.2 g/dL (ref 3.5–5.0)
Alkaline Phosphatase: 57 U/L (ref 38–126)
BILIRUBIN TOTAL: 1 mg/dL (ref 0.3–1.2)
BUN: 16 mg/dL (ref 6–20)
CHLORIDE: 109 mmol/L (ref 101–111)
CO2: 28 mmol/L (ref 22–32)
Calcium: 9.2 mg/dL (ref 8.9–10.3)
Creatinine, Ser: 1.76 mg/dL — ABNORMAL HIGH (ref 0.61–1.24)
GFR, EST AFRICAN AMERICAN: 42 mL/min — AB (ref >60)
GFR, EST NON AFRICAN AMERICAN: 36 mL/min — AB (ref >60)
Glucose, Bld: 100 mg/dL — ABNORMAL HIGH (ref 65–99)
POTASSIUM: 4.4 mmol/L (ref 3.5–5.1)
Sodium: 140 mmol/L (ref 135–145)
TOTAL PROTEIN: 6.9 g/dL (ref 6.5–8.1)

## 2014-09-24 LAB — CBC
HEMATOCRIT: 40.8 % (ref 39.0–52.0)
HEMOGLOBIN: 13.8 g/dL (ref 13.0–17.0)
MCH: 33.9 pg (ref 26.0–34.0)
MCHC: 33.8 g/dL (ref 30.0–36.0)
MCV: 100.2 fL — AB (ref 78.0–100.0)
Platelets: 133 10*3/uL — ABNORMAL LOW (ref 150–400)
RBC: 4.07 MIL/uL — AB (ref 4.22–5.81)
RDW: 12.2 % (ref 11.5–15.5)
WBC: 2.8 10*3/uL — AB (ref 4.0–10.5)

## 2014-09-24 LAB — URINE MICROSCOPIC-ADD ON

## 2014-09-24 MED ORDER — CEPHALEXIN 500 MG PO CAPS: 500.0000 mg | ORAL_CAPSULE | Freq: Four times a day (QID) | ORAL | Status: DC

## 2014-09-24 NOTE — ED Notes (Signed)
Patient was alert, oriented and stable upon discharge. RN went over AVS and patient had no further questions.  

## 2014-09-24 NOTE — ED Notes (Signed)
Hx of prostate cancer, was recently treated for UTI 9/14, reports dysuria x3 days and urinary leakage. Reports before last UTI treatment pt had leakage, but abx fixed issue. Pain 2/10.

## 2014-09-24 NOTE — ED Provider Notes (Signed)
CSN: [id number]     Arrival date & time 09/24/14  1238 History   First MD Initiated Contact with Patient 09/24/14 1537     Chief Complaint  Patient presents with  . Dysuria     (Consider location/radiation/quality/duration/timing/severity/associated sxs/prior Treatment) HPI [name] is a 74 y.o. male with multiple medical problems, presents to ED with complaint of dysuria, urinary incontinence. Symptoms started 3 days ago. History of the same, diagnosed with UTI. Patient states that he put on antibiotics which he finished and it improved his symptoms. He denies any back pain. He reports some abdominal discomfort. Denies any nausea or vomiting. No fever. No anorexia. No generalized malaise. Patient also has history of prostate cancer, followed by urology. States he was seen by them several months ago and was told everything looks normal.  Past Medical History  Diagnosis Date  . Herniated disc   . Hypertension   . Renal calculi BILATERAL   . Chronic back pain   . History of TIA (transient ischemic attack) 2010--  NO RESIDUAL    NONE SINCE  . Dyslipidemia   . Bilateral flank pain     DUE TO KIDNEY STONES  . Weakness of both legs   . Eczema   . History of kidney stones   . Arthritis   . Frequency of urination   . Urgency of urination   . Nocturia   . Hematuria   . Anxiety   . Glaucoma PT STATES RX RAN OUT  . Glaucoma   . Prostate cancer 03/2012  . Elevated PSA 01/11/2012    25.77  . History of shingles   . History of radiation therapy 06/07/12-08/02/12    prostate, 7800 cGy/40 sessions/ 5600cGy pelvic lymph nodes/40 sessions   Past Surgical History  Procedure Laterality Date  . Percutaneous nephrolithotomy  12-10-2007  &  11-07-2005    RIGHT RENAL CALCULI  . Cysto/ bilateral retrograde pyelogram/ left ureterscopic stone extraction  11-14-2005    LEFT URETERAL CALCULUS  . Cysto/ bilateral ureteral stents  11-06-2005  . Right ureteroscopic stone extraction  05-29-2003  .  Right ureteral stent placement  02-10-2003  . Repair left inguinal hernia w/ mesh  12-23-2002  . Internal urethrotomy/ turp  12-23-2002    BPH W/ STRICTURE  . Cysto/ left retrograde pyelogram/  left stent placment  05-21-2011  . Extracorporeal shock wave lithotripsy    . Transthoracic echocardiogram  12-19-2008    LVSF NORMAL/ EF 42-35%/ GRADE I DIASTOLIC DYSFUNCTION/   . Cystoscopy w/ ureteral stent removal  06/15/2011    Procedure: CYSTOSCOPY WITH STENT REMOVAL;  Surgeon: Franchot Gallo, MD;  Location: [facility];  Service: Urology;  Laterality: Left;  . Ureteroscopy  06/15/2011    Procedure: URETEROSCOPY;  Surgeon: Franchot Gallo, MD;  Location: [facility];  Service: Urology;  Laterality: Bilateral;  . Biliary stent placement  06/15/2011    Procedure: BILIARY STENT PLACEMENT;  Surgeon: Franchot Gallo, MD;  Location: [facility];  Service: Urology;  Laterality: Bilateral;  . Transurethral resection of bladder  2004   History reviewed. No pertinent family history. Social History  Substance Use Topics  . Smoking status: Former Smoker -- 40 years    Types: Cigarettes    Quit date: 06/10/1990  . Smokeless tobacco: Never Used  . Alcohol Use: No    Review of Systems  Constitutional: Negative for fever and chills.  Respiratory: Negative for cough, chest tightness and shortness of breath.  Cardiovascular: Negative for chest pain, palpitations and leg swelling.  Gastrointestinal: Positive for abdominal pain. Negative for nausea, vomiting, diarrhea and abdominal distention.  Genitourinary: Positive for dysuria, urgency and frequency. Negative for hematuria, penile swelling, scrotal swelling and testicular pain.  Musculoskeletal: Negative for myalgias, arthralgias, neck pain and neck stiffness.  Skin: Negative for rash.  Allergic/Immunologic: Negative for immunocompromised state.  Neurological: Negative for dizziness, weakness,  light-headedness, numbness and headaches.  All other systems reviewed and are negative.     Allergies  Tape; Doxycycline hyclate; and Sulfa antibiotics  Home Medications   Prior to Admission medications   Medication Sig Start Date End Date Taking? Authorizing Provider  ALPRAZolam Duanne Moron) 0.5 MG tablet Take 0.5 mg by mouth daily as needed for anxiety. Anxiety/sleep   Yes Historical Provider, MD  atenolol (TENORMIN) 50 MG tablet Take 50 mg by mouth daily.  01/25/14  Yes Historical Provider, MD  b complex vitamins tablet Take 1 tablet by mouth daily.    Yes Historical Provider, MD  HYDROcodone-acetaminophen (NORCO) 10-325 MG per tablet Take 1 tablet by mouth 4 (four) times daily as needed for moderate pain or severe pain.  02/12/14  Yes Historical Provider, MD  naproxen sodium (ANAPROX) 220 MG tablet Take 440 mg by mouth every 12 (twelve) hours as needed (PAIN).   Yes Historical Provider, MD  amLODipine (NORVASC) 10 MG tablet Take 1 tablet (10 mg total) by mouth daily. Patient not taking: Reported on 06/26/2014 04/12/12   [name], MD  aspirin EC 81 MG EC tablet Take 1 tablet (81 mg total) by mouth daily. Patient not taking: Reported on 06/26/2014 04/11/12   [name], MD  atorvastatin (LIPITOR) 40 MG tablet Take 1 tablet (40 mg total) by mouth daily at 6 PM. Patient not taking: Reported on 06/26/2014 04/11/12   [name], MD  cephALEXin (KEFLEX) 500 MG capsule Take 1 capsule (500 mg total) by mouth 4 (four) times daily. Patient not taking: Reported on 09/24/2014 08/17/14   [name], PA-C  hydrochlorothiazide (HYDRODIURIL) 25 MG tablet Take 1 tablet (25 mg total) by mouth daily. Patient not taking: Reported on 02/25/2014 04/11/12   [name], MD  loratadine (CLARITIN) 10 MG tablet Take 1 tablet (10 mg total) by mouth daily. Patient not taking: Reported on 09/24/2014 04/11/12   [name], MD  ondansetron (ZOFRAN ODT) 4 MG disintegrating tablet 4mg  ODT  q4 hours prn nausea/vomit Patient not taking: Reported on 06/26/2014 02/25/14   [name], MD  oxyCODONE-acetaminophen (PERCOCET/ROXICET) 5-325 MG per tablet Take 1-2 tablets by mouth every 4 (four) hours as needed for severe pain. Patient not taking: Reported on 08/17/2014 02/25/14   [name], MD  phenazopyridine (PYRIDIUM) 200 MG tablet Take 1 tablet (200 mg total) by mouth 3 (three) times daily. Patient not taking: Reported on 09/24/2014 08/17/14   [name], PA-C  polyethylene glycol (GOLYTELY) 236 G solution Take 4,000 mLs by mouth once. Drink 8 ounces every 15 minutes until you are passing clear liquid. Patient not taking: Reported on 08/17/2014 6/72/09   Delora Fuel, MD  polyethylene glycol [facility] Floria Raveling) packet Take 17 g by mouth daily. Patient not taking: Reported on 09/24/2014 4/70/96   Delora Fuel, MD  potassium citrate (UROCIT-K) 10 MEQ (1080 MG) SR tablet Take 1 tablet (10 mEq total) by mouth 3 (three) times daily with meals. Patient not taking: Reported on 09/24/2014 06/15/11   Franchot Gallo, MD   BP 160/91 mmHg  Pulse 54  Temp(Src) 98 F (36.7 C) (Oral)  Resp 16  Ht 6' (1.829 m)  Wt 220 lb (99.791 kg)  BMI 29.83 kg/m2  SpO2 100% Physical Exam  Constitutional: He is oriented to person, place, and time. He appears well-developed and well-nourished. No distress.  HENT:  Head: Normocephalic and atraumatic.  Eyes: Conjunctivae are normal.  Neck: Neck supple.  Cardiovascular: Normal rate, regular rhythm and normal heart sounds.   Pulmonary/Chest: Effort normal. No respiratory distress. He has no wheezes. He has no rales.  Abdominal: Soft. Bowel sounds are normal. He exhibits no distension. There is no tenderness. There is no rebound and no guarding.  Musculoskeletal: He exhibits no edema.  Neurological: He is alert and oriented to person, place, and time.  Skin: Skin is warm and dry.  Nursing note and vitals reviewed.   ED Course  Procedures (including  critical care time) Labs Review Labs Reviewed  COMPREHENSIVE METABOLIC PANEL - Abnormal; Notable for the following:    Glucose, Bld 100 (*)    Creatinine, Ser 1.76 (*)    GFR calc non Af Amer 36 (*)    GFR calc Af Amer 42 (*)    Anion gap 3 (*)    All other components within normal limits  CBC - Abnormal; Notable for the following:    WBC 2.8 (*)    RBC 4.07 (*)    MCV 100.2 (*)    Platelets 133 (*)    All other components within normal limits  URINALYSIS, ROUTINE W REFLEX MICROSCOPIC (NOT AT [facility]) - Abnormal; Notable for the following:    Protein, ur 30 (*)    Leukocytes, UA SMALL (*)    All other components within normal limits  URINE CULTURE  URINE MICROSCOPIC-ADD ON    Imaging Review No results found. I have personally reviewed and evaluated these images and lab results as part of my medical decision-making.   EKG Interpretation None      MDM   Final diagnoses:  Dysuria    Patient complaining of dysuria, urinary incontinence, history of UTIs, also history of prostate cancer, treated with external beam/IMRT. Currently in remission. Labs and urinalysis obtained by triage nurse. Patient does not have any abdominal tenderness on exam. He is afebrile. Nontoxic-appearing.   Patient is urinalysis shows small leukocytes, 11-20 white blood cells. No bacteria seen. White count is low at 2.8. Creatinine elevated at baseline. Question infection versus prostate problems. Postvoid residual showed 50 mL of urine. At this time patient is stable for discharge home. Urine culture sent. Will start on Keflex. Will have patient follow up closely with his urologist for further evaluation. Return precautions discussed  Filed Vitals:   09/24/14 1256 09/24/14 1653  BP: 160/91 168/96  Pulse: 54 51  Temp: 98 F (36.7 C) 98.3 F (36.8 C)  TempSrc: Oral Oral  Resp: 16 16  Height: 6' (1.829 m)   Weight: 220 lb (99.791 kg)   SpO2: 100% 100%       Jeannett Senior, PA-C 09/24/14  1726  Milton Ferguson, MD 09/26/14 [phone]

## 2014-09-24 NOTE — Discharge Instructions (Signed)
Take keflex as prescribed until all gone for infection. Follow up with urology for further evaluation. Return if worsening symptos.    Dysuria Dysuria is the medical term for pain with urination. There are many causes for dysuria, but urinary tract infection is the most common. If a urinalysis was performed it can show that there is a urinary tract infection. A urine culture confirms that you or your child is sick. You will need to follow up with a healthcare provider because:  If a urine culture was done you will need to know the culture results and treatment recommendations.  If the urine culture was positive, you or your child will need to be put on antibiotics or know if the antibiotics prescribed are the right antibiotics for your urinary tract infection.  If the urine culture is negative (no urinary tract infection), then other causes may need to be explored or antibiotics need to be stopped. Today laboratory work may have been done and there does not seem to be an infection. If cultures were done they will take at least 24 to 48 hours to be completed. Today x-rays may have been taken and they read as normal. No cause can be found for the problems. The x-rays may be re-read by a radiologist and you will be contacted if additional findings are made. You or your child may have been put on medications to help with this problem until you can see your primary caregiver. If the problems get better, see your primary caregiver if the problems return. If you were given antibiotics (medications which kill germs), take all of the mediations as directed for the full course of treatment.  If laboratory work was done, you need to find the results. Leave a telephone number where you can be reached. If this is not possible, make sure you find out how you are to get test results. HOME CARE INSTRUCTIONS   Drink lots of fluids. For adults, drink eight, 8 ounce glasses of clear juice or water a day. For children,  replace fluids as suggested by your caregiver.  Empty the bladder often. Avoid holding urine for long periods of time.  After a bowel movement, women should cleanse front to back, using each tissue only once.  Empty your bladder before and after sexual intercourse.  Take all the medicine given to you until it is gone. You may feel better in a few days, but TAKE ALL MEDICINE.  Avoid caffeine, tea, alcohol and carbonated beverages, because they tend to irritate the bladder.  In men, alcohol may irritate the prostate.  Only take over-the-counter or prescription medicines for pain, discomfort, or fever as directed by your caregiver.  If your caregiver has given you a follow-up appointment, it is very important to keep that appointment. Not keeping the appointment could result in a chronic or permanent injury, pain, and disability. If there is any problem keeping the appointment, you must call back to this facility for assistance. SEEK IMMEDIATE MEDICAL CARE IF:   Back pain develops.  A fever develops.  There is nausea (feeling sick to your stomach) or vomiting (throwing up).  Problems are no better with medications or are getting worse. MAKE SURE YOU:   Understand these instructions.  Will watch your condition.  Will get help right away if you are not doing well or get worse. Document Released: 09/18/2003 Document Revised: 03/14/2011 Document Reviewed: 07/26/2007 [facility] Patient Information 2015 [place], [place]. This information is not intended to replace advice given to  you by your health care provider. Make sure you discuss any questions you have with your health care provider.

## 2014-09-26 LAB — URINE CULTURE

## 2015-02-03 ENCOUNTER — Other Ambulatory Visit: Payer: Self-pay | Admitting: Gastroenterology

## 2015-02-03 ENCOUNTER — Ambulatory Visit
Admission: RE | Admit: 2015-02-03 | Discharge: 2015-02-03 | Disposition: A | Discharge status: Home / Self Care | Payer: Medicare Other | Source: Ambulatory Visit | Attending: Gastroenterology | Admitting: Gastroenterology

## 2015-02-03 DIAGNOSIS — K625 Hemorrhage of anus and rectum: Secondary | ICD-10-CM

## 2017-03-15 ENCOUNTER — Emergency Department (HOSPITAL_COMMUNITY)
Admission: EM | Admit: 2017-03-15 | Discharge: 2017-03-15 | Disposition: A | Discharge status: Home / Self Care | Payer: Medicare Other | Attending: Emergency Medicine | Admitting: Emergency Medicine

## 2017-03-15 ENCOUNTER — Emergency Department (HOSPITAL_COMMUNITY): Payer: Medicare Other

## 2017-03-15 ENCOUNTER — Other Ambulatory Visit: Payer: Self-pay

## 2017-03-15 ENCOUNTER — Encounter (HOSPITAL_COMMUNITY): Payer: Self-pay | Admitting: Emergency Medicine

## 2017-03-15 ENCOUNTER — Ambulatory Visit (HOSPITAL_COMMUNITY)
Admission: EM | Admit: 2017-03-15 | Discharge: 2017-03-15 | Disposition: A | Discharge status: Home / Self Care | Payer: Medicare Other | Source: Home / Self Care | Attending: Family Medicine | Admitting: Family Medicine

## 2017-03-15 DIAGNOSIS — R1084 Generalized abdominal pain: Secondary | ICD-10-CM | POA: Insufficient documentation

## 2017-03-15 DIAGNOSIS — Z7982 Long term (current) use of aspirin: Secondary | ICD-10-CM | POA: Insufficient documentation

## 2017-03-15 DIAGNOSIS — R079 Chest pain, unspecified: Secondary | ICD-10-CM

## 2017-03-15 DIAGNOSIS — Z79899 Other long term (current) drug therapy: Secondary | ICD-10-CM | POA: Insufficient documentation

## 2017-03-15 DIAGNOSIS — R197 Diarrhea, unspecified: Secondary | ICD-10-CM | POA: Diagnosis not present

## 2017-03-15 DIAGNOSIS — I10 Essential (primary) hypertension: Secondary | ICD-10-CM | POA: Diagnosis not present

## 2017-03-15 DIAGNOSIS — Z87891 Personal history of nicotine dependence: Secondary | ICD-10-CM | POA: Insufficient documentation

## 2017-03-15 LAB — CBC
HEMATOCRIT: 42.2 % (ref 39.0–52.0)
Hemoglobin: 14.2 g/dL (ref 13.0–17.0)
MCH: 34.2 pg — ABNORMAL HIGH (ref 26.0–34.0)
MCHC: 33.6 g/dL (ref 30.0–36.0)
MCV: 101.7 fL — ABNORMAL HIGH (ref 78.0–100.0)
Platelets: 109 10*3/uL — ABNORMAL LOW (ref 150–400)
RBC: 4.15 MIL/uL — ABNORMAL LOW (ref 4.22–5.81)
RDW: 13.3 % (ref 11.5–15.5)
WBC: 4.6 10*3/uL (ref 4.0–10.5)

## 2017-03-15 LAB — BASIC METABOLIC PANEL
Anion gap: 10 (ref 5–15)
BUN: 20 mg/dL (ref 6–20)
CHLORIDE: 112 mmol/L — AB (ref 101–111)
CO2: 19 mmol/L — ABNORMAL LOW (ref 22–32)
Calcium: 9.2 mg/dL (ref 8.9–10.3)
Creatinine, Ser: 1.53 mg/dL — ABNORMAL HIGH (ref 0.61–1.24)
GFR calc Af Amer: 49 mL/min — ABNORMAL LOW (ref >60)
GFR calc non Af Amer: 42 mL/min — ABNORMAL LOW (ref >60)
GLUCOSE: 134 mg/dL — AB (ref 65–99)
POTASSIUM: 3.9 mmol/L (ref 3.5–5.1)
Sodium: 141 mmol/L (ref 135–145)

## 2017-03-15 LAB — I-STAT TROPONIN, ED: Troponin i, poc: 0 ng/mL (ref 0.00–0.08)

## 2017-03-15 NOTE — Discharge Instructions (Addendum)
Make sure that you are drinking plenty of fluids.  You can use Kaopectate or Imodium if needed for diarrhea.  Follow-up with your doctor, tomorrow as scheduled for the new physician appointment.

## 2017-03-15 NOTE — Discharge Instructions (Signed)
77 year old with chest pain, nausea, vomiting, hx of HTN, hyperlipidemia, previous TIA. Recommending further evaluation in emergency room

## 2017-03-15 NOTE — ED Notes (Signed)
Provider at bedside

## 2017-03-15 NOTE — ED Triage Notes (Signed)
Chest pain started yesterday, then stopped.  Chest pain resumed an hour ago.  Right side chest pain.    Patient reports nausea and vomiting for 2 days and diarrhea.  N/v/d started prior to chest pain

## 2017-03-15 NOTE — ED Provider Notes (Signed)
[place] EMERGENCY DEPARTMENT Provider Note   CSN: [id number] Arrival date & time: 03/15/17  1812     History   Chief Complaint Chief Complaint  Patient presents with  . Chest Pain    HPI [name] is a 77 y.o. male.  Presents for evaluation of multiple episodes of diarrhea associated with abdominal cramping.  Abdominal pain is diffuse, and he has had 2 episodes of vomiting.  He denies fever.  He has been ill for 2 days.  He does not have any persistent chest pain, weakness, shortness of breath, nausea or diaphoresis.  No known sick contacts or altered food ingestion.  There are no other known modifying factors.  HPI  Past Medical History:  Diagnosis Date  . Anxiety   . Arthritis   . Bilateral flank pain    DUE TO KIDNEY STONES  . Chronic back pain   . Dyslipidemia   . Eczema   . Elevated PSA 01/11/2012   25.77  . Frequency of urination   . Glaucoma PT STATES RX RAN OUT  . Glaucoma   . Hematuria   . Herniated disc   . History of kidney stones   . History of radiation therapy 06/07/12-08/02/12   prostate, 7800 cGy/40 sessions/ 5600cGy pelvic lymph nodes/40 sessions  . History of shingles   . History of TIA (transient ischemic attack) 2010--  NO RESIDUAL   NONE SINCE  . Hypertension   . Nocturia   . Prostate cancer ([place]) 03/2012  . Renal calculi BILATERAL   . Urgency of urination   . Weakness of both legs     Patient Active Problem List   Diagnosis Date Noted  . Prostate cancer ([place]) 05/01/2012  . Other and unspecified hyperlipidemia 04/12/2012  . Chest pain, unspecified 04/12/2012  . 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 Essential hypertension, benign 04/11/2012    Past Surgical History:  Procedure Laterality Date  . BILIARY STENT PLACEMENT  06/15/2011   Procedure: BILIARY STENT PLACEMENT;  Surgeon: Franchot Gallo, MD;  Location: [facility];  Service: Urology;  Laterality: Bilateral;  . CYSTO/ BILATERAL  RETROGRADE PYELOGRAM/ LEFT URETERSCOPIC STONE EXTRACTION  11-14-2005   LEFT URETERAL CALCULUS  . CYSTO/ BILATERAL URETERAL STENTS  11-06-2005  . CYSTO/ LEFT RETROGRADE PYELOGRAM/  LEFT STENT PLACMENT  05-21-2011  . CYSTOSCOPY W/ URETERAL STENT REMOVAL  06/15/2011   Procedure: CYSTOSCOPY WITH STENT REMOVAL;  Surgeon: Franchot Gallo, MD;  Location: [facility];  Service: Urology;  Laterality: Left;  . EXTRACORPOREAL SHOCK WAVE LITHOTRIPSY    . INTERNAL URETHROTOMY/ TURP  12-23-2002   BPH W/ STRICTURE  . PERCUTANEOUS NEPHROLITHOTOMY  12-10-2007  &  11-07-2005   RIGHT RENAL CALCULI  . REPAIR LEFT INGUINAL HERNIA W/ MESH  12-23-2002  . RIGHT URETERAL STENT PLACEMENT  02-10-2003  . RIGHT URETEROSCOPIC STONE EXTRACTION  05-29-2003  . TRANSTHORACIC ECHOCARDIOGRAM  12-19-2008   LVSF NORMAL/ EF 75-10%/ GRADE I DIASTOLIC DYSFUNCTION/   . TRANSURETHRAL RESECTION OF BLADDER  2004  . URETEROSCOPY  06/15/2011   Procedure: URETEROSCOPY;  Surgeon: Franchot Gallo, MD;  Location[facility];  Service: Urology;  Laterality: Bilateral;       Home Medications    Prior to Admission medications   Medication Sig Start Date End Date Taking? Authorizing Provider  ALPRAZolam Duanne Moron) 0.5 MG tablet Take 0.5 mg by mouth daily as needed for anxiety. Anxiety/sleep   Yes [provider]  aspirin EC 81 MG  EC tablet Take 1 tablet (81 mg total) by mouth daily. 04/11/12  Yes [name], MD  HYDROcodone-acetaminophen (NORCO) 10-325 MG per tablet Take 1 tablet by mouth 4 (four) times daily as needed for moderate pain or severe pain.  02/12/14  Yes [provider]  SIMBRINZA 1-0.2 % SUSP Place 1 drop into both eyes 2 (two) times daily. 02/19/17  Yes [provider]  timolol (TIMOPTIC) 0.5 % ophthalmic solution Place 1 drop into both eyes 2 (two) times daily as needed. 01/11/17  Yes [provider]  amLODipine (NORVASC) 10 MG tablet Take 1 tablet (10  mg total) by mouth daily. 03/16/17   Jaynee Eagles, PA-C  atenolol (TENORMIN) 50 MG tablet Take 1 tablet (50 mg total) by mouth daily. 03/16/17   Jaynee Eagles, PA-C  b complex vitamins tablet Take 1 tablet by mouth daily.     [provider]    Family History No family history on file.  Social History Social History   Tobacco Use  . Smoking status: Former Smoker    Years: 40.00    Types: Cigarettes    Last attempt to quit: 06/10/1990    Years since quitting: 26.7  . Smokeless tobacco: Never Used  Substance Use Topics  . Alcohol use: No  . Drug use: No     Allergies   Tape; Doxycycline hyclate; and Sulfa antibiotics   Review of Systems Review of Systems  All other systems reviewed and are negative.    Physical Exam Updated Vital Signs BP (!) 143/102   Pulse 83   Temp 97.8 F (36.6 C) (Oral)   Resp 20   Ht 6' (1.829 m)   Wt 96.2 kg (212 lb)   SpO2 99%   BMI 28.75 kg/m   Physical Exam  Constitutional: He is oriented to person, place, and time. He appears well-developed.  Elderly, overweight  HENT:  Head: Normocephalic and atraumatic.  Right Ear: External ear normal.  Left Ear: External ear normal.  Eyes: Conjunctivae and EOM are normal. Pupils are equal, round, and reactive to light.  Neck: Normal range of motion and phonation normal. Neck supple.  Cardiovascular: Normal rate, regular rhythm and normal heart sounds.  Pulmonary/Chest: Effort normal and breath sounds normal. No stridor. No respiratory distress. He exhibits no bony tenderness.  Abdominal: Soft. He exhibits no distension. There is no tenderness. There is no guarding.  Musculoskeletal: Normal range of motion.  Neurological: He is alert and oriented to person, place, and time. No cranial nerve deficit or sensory deficit. He exhibits normal muscle tone. Coordination normal.  Skin: Skin is warm, dry and intact.  Psychiatric: He has a normal mood and affect. His behavior is normal. Judgment and  thought content normal.  Nursing note and vitals reviewed.    ED Treatments / Results  Labs (all labs ordered are listed, but only abnormal results are displayed) Labs Reviewed  BASIC METABOLIC PANEL - Abnormal; Notable for the following components:      Result Value   Chloride 112 (*)    CO2 19 (*)    Glucose, Bld 134 (*)    Creatinine, Ser 1.53 (*)    GFR calc non Af Amer 42 (*)    GFR calc Af Amer 49 (*)    All other components within normal limits  CBC - Abnormal; Notable for the following components:   RBC 4.15 (*)    MCV 101.7 (*)    MCH 34.2 (*)    Platelets  109 (*)    All other components within normal limits  I-STAT TROPONIN, ED    EKG  EKG Interpretation  Date/Time:  Wednesday March 15 2017 18:28:16 EDT Ventricular Rate:  99 PR Interval:  132 QRS Duration: 72 QT Interval:  346 QTC Calculation: 444 R Axis:   14 Text Interpretation:  Normal sinus rhythm Nonspecific ST and T wave abnormality Abnormal ECG No STEMI. ST depressions noted and appear to be new from prior tracings.  Confirmed by Nanda Quinton [phone]) on 03/15/2017 6:34:51 PM       Radiology Dg Chest 2 View  Result Date: 03/15/2017 CLINICAL DATA:  Chest pain EXAM: CHEST - 2 VIEW COMPARISON:  06/26/2014 FINDINGS: Heart and mediastinal contours are within normal limits. No focal opacities or effusions. No acute bony abnormality. IMPRESSION: No active cardiopulmonary disease. Electronically Signed   By: Rolm Baptise M.D.   On: 03/15/2017 19:37    Procedures Procedures (including critical care time)  Medications Ordered in ED Medications - No data to display   Initial Impression / Assessment and Plan / ED Course  I have reviewed the triage vital signs and the nursing notes.  Pertinent labs & imaging results that were available during my care of the patient were reviewed by me and considered in my medical decision making (see chart for details).      Patient Vitals for the past 24 hrs:  BP  Temp Temp src Pulse Resp SpO2 Height Weight  03/15/17 2255 (!) 143/102 - - 83 20 99 % - -  03/15/17 2130 (!) 163/100 - - 84 20 100 % - -  03/15/17 2033 (!) 170/101 - - 96 16 99 % - -  03/15/17 1830 (!) 153/106 97.8 F (36.6 C) Oral 97 16 99 % 6' (1.829 m) 96.2 kg (212 lb)    At discharge- reevaluation with update and discussion. After initial assessment and treatment, an updated evaluation reveals he is comfortable and has no further complaints findings discussed and questions were answered. Daleen Bo      Final Clinical Impressions(s) / ED Diagnoses   Final diagnoses:  Diarrhea, unspecified type   Nonspecific diarrhea, Likely enteritis.  Incidental mild hypertension.  Patient was evaluated for cardiac and gastrointestinal disorders with blood work, indicating normal troponin, normal CBC, normal electrolytes except chloride high at 112 in CO2 low at 19 EKG did not indicate ischemia or infarct.  Chest x-ray did not show heart failure pneumonia..  Nursing Notes Reviewed/ Care Coordinated Applicable Imaging Reviewed Interpretation of Laboratory Data incorporated into ED treatment  The patient appears reasonably screened and/or stabilized for discharge and I doubt any other medical condition or other [facility] requiring further screening, evaluation, or treatment in the ED at this time prior to discharge.  Plan: Home Medications-OTC analgesia and continue current medications, instructed to use Kaopectate or Imodium if needed for diarrhea; Home Treatments-gradually advance diet and drink plenty of fluids; return here if the recommended treatment, does not improve the symptoms; Recommended follow up-PCP follow-up as needed.   ED Discharge Orders    None       Daleen Bo, MD 03/16/17 [phone]

## 2017-03-15 NOTE — ED Triage Notes (Signed)
Pt sent from UC, c/o abdominal pain that radiates to the chest that started yesterday. Also c/o nausea/vomiting/diarrhea x 2 days.

## 2017-03-15 NOTE — ED Provider Notes (Signed)
Mr. JOHNWILLIAM SHEPPERSON is a 77 year old male history of hypertension, hyperlipidemia, tobacco abuse, previous TIA presenting today with chest pain.  States that yesterday he began with a dull aching sensation in the center of his chest while he was lying down.  Did resolve, but returned today.  Again he was lying down.  Denies worsening with exertion or walking.  Patient also is having associated nausea with the chest pain.  As well as leg pain.  Pain is rated a 4 out of 5.  Vital signs: Temperature 97.4, pulse 97, respirations 22, blood pressure 167/96, oxygen 100%.  EKG: Reveals normal sinus rhythm, leads II and III showing possible flutter waves vs. Artifact, no signs of ischemia or infarction.  Patient was quickly evaluated, based off his history, age and chest pain, recommending further evaluation in emergency room.  Patient was not accompanied by any family members or friends, will escort patient down to emergency room.  Feel patient is stable enough to not go via ambulance.     Janith Lima, [place] 03/15/17 1806

## 2017-03-16 ENCOUNTER — Ambulatory Visit (INDEPENDENT_AMBULATORY_CARE_PROVIDER_SITE_OTHER): Payer: Medicare Other | Admitting: Urgent Care

## 2017-03-16 ENCOUNTER — Encounter: Payer: Self-pay | Admitting: Urgent Care

## 2017-03-16 VITALS — BP 111/76 | HR 96 | Temp 97.4°F | Resp 18 | Ht 72.5 in | Wt 205.2 lb

## 2017-03-16 DIAGNOSIS — G894 Chronic pain syndrome: Secondary | ICD-10-CM

## 2017-03-16 DIAGNOSIS — Z9889 Other specified postprocedural states: Secondary | ICD-10-CM

## 2017-03-16 DIAGNOSIS — I10 Essential (primary) hypertension: Secondary | ICD-10-CM

## 2017-03-16 DIAGNOSIS — R350 Frequency of micturition: Secondary | ICD-10-CM

## 2017-03-16 DIAGNOSIS — F419 Anxiety disorder, unspecified: Secondary | ICD-10-CM

## 2017-03-16 DIAGNOSIS — Z8546 Personal history of malignant neoplasm of prostate: Secondary | ICD-10-CM | POA: Diagnosis not present

## 2017-03-16 DIAGNOSIS — M5126 Other intervertebral disc displacement, lumbar region: Secondary | ICD-10-CM | POA: Diagnosis not present

## 2017-03-16 DIAGNOSIS — R0789 Other chest pain: Secondary | ICD-10-CM | POA: Diagnosis not present

## 2017-03-16 MED ORDER — ATENOLOL 50 MG PO TABS: 50.0000 mg | ORAL_TABLET | Freq: Every day | ORAL | 0 refills | Status: DC

## 2017-03-16 MED ORDER — AMLODIPINE BESYLATE 10 MG PO TABS
10.0000 mg | ORAL_TABLET | Freq: Every day | ORAL | 0 refills | Status: AC
Start: 2017-03-16 — End: ?

## 2017-03-16 NOTE — Patient Instructions (Addendum)
If your chest pain returns, develop confusion, dizziness, heart racing, please report to the ER. If your back pain becomes severe, then report to the ER for a recheck. Please contact your urologist for a recheck on your prostate, urinary issues, history of prostate cancer.   You may take 500mg  Tylenol every 6 hours for pain and inflammation.    IF you received an x-ray today, you will receive an invoice from [facility]. Please contact [facility] at [phone] with questions or concerns regarding your invoice.   IF you received labwork today, you will receive an invoice from [place]. Please contact [facility] at [phone] with questions or concerns regarding your invoice.   Our billing staff will not be able to assist you with questions regarding bills from these companies.  You will be contacted with the lab results as soon as they are available. The fastest way to get your results is to activate your My Chart account. Instructions are located on the last page of this paperwork. If you have not heard from [place] regarding the results in 2 weeks, please contact this office.

## 2017-03-16 NOTE — Progress Notes (Signed)
MRN: [id number] DOB: 1940/12/07  Subjective:   [name] is a 77 y.o. male presenting for refills of Xanax and hydrocodone. Patient was previously being treated by Dr. Ricke Hey. He ran out of Xanax on 03/14/2017 and ran out of hydrocodone today. Was taking Xanax and hydrocodone times daily a piece. He presented to the ER yesterday for chest pain that has since resolved. Work up was negative including ecg, troponin. Patient recently had his blood pressure medication filled. Denies smoking cigarettes or drinking alcohol. He does not have a PCP now. He does have an urologist for complicated history of renal stones, prostate cancer. Has not seen a neurosurgeon or had any follow up for his lumbar herniated disc. He does not have a cardiologist. Denies smoking cigarettes or drinking alcohol.   [name] has a current medication list which includes the following prescription(s): alprazolam, amlodipine, aspirin, atenolol, b complex vitamins, hydrocodone-acetaminophen, simbrinza, and timolol. Also is allergic to tape; doxycycline hyclate; and sulfa antibiotics.  [name]  has a past medical history of Anxiety, Arthritis, Bilateral flank pain, Chronic back pain, Dyslipidemia, Eczema, Elevated PSA (01/11/2012), Frequency of urination, Glaucoma (PT STATES RX RAN OUT), Glaucoma, Hematuria, Herniated disc, History of kidney stones, History of radiation therapy (06/07/12-08/02/12), History of shingles, History of TIA (transient ischemic attack) (2010--  NO RESIDUAL), Hypertension, Nocturia, Prostate cancer ([place]) (03/2012), Renal calculi (BILATERAL ), Urgency of urination, and Weakness of both legs. Also  has a past surgical history that includes PERCUTANEOUS NEPHROLITHOTOMY (12-10-2007  &  11-07-2005); CYSTO/ BILATERAL RETROGRADE PYELOGRAM/ LEFT URETERSCOPIC STONE EXTRACTION (11-14-2005); CYSTO/ BILATERAL URETERAL STENTS (11-06-2005); RIGHT URETEROSCOPIC STONE EXTRACTION (05-29-2003); RIGHT URETERAL STENT PLACEMENT  (02-10-2003); REPAIR LEFT INGUINAL HERNIA W/ MESH (12-23-2002); INTERNAL URETHROTOMY/ TURP (12-23-2002); CYSTO/ LEFT RETROGRADE PYELOGRAM/  LEFT STENT PLACMENT (05-21-2011); Extracorporeal shock wave lithotripsy; transthoracic echocardiogram (12-19-2008); Cystoscopy w/ ureteral stent removal (06/15/2011); Ureteroscopy (06/15/2011); biliary stent placement (06/15/2011); and Transurethral resection of bladder (2004).  Objective:   Vitals: BP 111/76   Pulse 96   Temp (!) 97.4 F (36.3 C) (Oral)   Resp 18   Ht 6' 0.5" (1.842 m)   Wt 205 lb 3.2 oz (93.1 kg)   SpO2 97%   BMI 27.45 kg/m   Physical Exam  Constitutional: He is oriented to person, place, and time. He appears well-developed and well-nourished.  HENT:  Mouth/Throat: Oropharynx is clear and moist.  Eyes: EOM are normal. Pupils are equal, round, and reactive to light. Right eye exhibits no discharge. Left eye exhibits no discharge. No scleral icterus.  Neck: Normal range of motion. Neck supple. No JVD present. No thyromegaly present.  Cardiovascular: Normal rate, regular rhythm and intact distal pulses. Exam reveals no gallop and no friction rub.  No murmur heard. Pulmonary/Chest: No respiratory distress. He has no wheezes. He has no rales.  Abdominal: Soft. Bowel sounds are normal. He exhibits no distension and no mass. There is no tenderness. There is no guarding.  Musculoskeletal: He exhibits no edema.  Neurological: He is alert and oriented to person, place, and time. He displays normal reflexes.  Skin: Skin is warm and dry.  Psychiatric: He has a normal mood and affect.   Results for orders placed or performed during the hospital encounter of 03/15/17 (from the past 24 hour(s))  Basic metabolic panel     Status: Abnormal   Collection Time: 03/15/17  6:20 PM  Result Value Ref Range   Sodium 141 135 - 145 mmol/L   Potassium 3.9 3.5 - 5.1 mmol/L  Chloride 112 (H) 101 - 111 mmol/L   CO2 19 (L) 22 - 32 mmol/L   Glucose, Bld  134 (H) 65 - 99 mg/dL   BUN 20 6 - 20 mg/dL   Creatinine, Ser 1.53 (H) 0.61 - 1.24 mg/dL   Calcium 9.2 8.9 - 10.3 mg/dL   GFR calc non Af Amer 42 (L) >60 mL/min   GFR calc Af Amer 49 (L) >60 mL/min   Anion gap 10 5 - 15  CBC     Status: Abnormal   Collection Time: 03/15/17  6:20 PM  Result Value Ref Range   WBC 4.6 4.0 - 10.5 K/uL   RBC 4.15 (L) 4.22 - 5.81 MIL/uL   Hemoglobin 14.2 13.0 - 17.0 g/dL   HCT 42.2 39.0 - 52.0 %   MCV 101.7 (H) 78.0 - 100.0 fL   MCH 34.2 (H) 26.0 - 34.0 pg   MCHC 33.6 30.0 - 36.0 g/dL   RDW 13.3 11.5 - 15.5 %   Platelets 109 (L) 150 - 400 K/uL  I-stat troponin, ED     Status: None   Collection Time: 03/15/17  6:36 PM  Result Value Ref Range   Troponin i, poc 0.00 0.00 - 0.08 ng/mL   Comment 3           Assessment and Plan :   Lumbar herniated disc - Plan: Ambulatory referral to Neurosurgery  Chronic pain syndrome - Plan: Ambulatory referral to Neurosurgery, Ambulatory referral to Internal Medicine  Anxiety  Essential hypertension - Plan: Ambulatory referral to Internal Medicine, Ambulatory referral to Cardiology  Atypical chest pain - Plan: Ambulatory referral to Internal Medicine, Ambulatory referral to Cardiology  History of prostate cancer - Plan: Ambulatory referral to Internal Medicine  History of prostate surgery  Urinary frequency  Discussed high risk of using multiple controlled substances. I denied refill request for Xanax and hydrocodone. I refilled his amlodipine, atenolol to bridge his visit to internal medicine which I believe would be the best fit for the patient. Referral to cardiology for atypical chest pain in setting of HTN is pending. Also placed referral to neurosurgery for recheck on herniated disc which the patient reports is where the majority of his back pain is coming from. Patient was amicable during his visit despite not getting refills from Xanax or hydrocodone. Strict ER precautions given. Return-to-clinic precautions  discussed, patient verbalized understanding.   Jaynee Eagles, PA-C [facility] [phone] 03/16/2017  3:06 PM

## 2017-06-22 ENCOUNTER — Telehealth: Payer: Self-pay | Admitting: Urgent Care

## 2017-06-22 NOTE — Telephone Encounter (Signed)
[facility] has called several times to schedule pt and they never received a call back to schedule.  Referral is now inactive

## 2017-06-26 ENCOUNTER — Inpatient Hospital Stay (HOSPITAL_COMMUNITY)
Admission: EM | Admit: 2017-06-26 | Discharge: 2017-06-29 | DRG: 690 | Disposition: A | Discharge status: Home / Self Care | Payer: Medicare Other | Attending: Internal Medicine | Admitting: Internal Medicine

## 2017-06-26 ENCOUNTER — Emergency Department (HOSPITAL_COMMUNITY): Payer: Medicare Other

## 2017-06-26 ENCOUNTER — Encounter (HOSPITAL_COMMUNITY): Payer: Self-pay | Admitting: Emergency Medicine

## 2017-06-26 ENCOUNTER — Other Ambulatory Visit: Payer: Self-pay

## 2017-06-26 DIAGNOSIS — K7689 Other specified diseases of liver: Secondary | ICD-10-CM | POA: Diagnosis present

## 2017-06-26 DIAGNOSIS — N390 Urinary tract infection, site not specified: Secondary | ICD-10-CM | POA: Diagnosis present

## 2017-06-26 DIAGNOSIS — Z87891 Personal history of nicotine dependence: Secondary | ICD-10-CM

## 2017-06-26 DIAGNOSIS — G8929 Other chronic pain: Secondary | ICD-10-CM | POA: Diagnosis present

## 2017-06-26 DIAGNOSIS — Z79899 Other long term (current) drug therapy: Secondary | ICD-10-CM

## 2017-06-26 DIAGNOSIS — N183 Chronic kidney disease, stage 3 (moderate): Secondary | ICD-10-CM | POA: Diagnosis not present

## 2017-06-26 DIAGNOSIS — N136 Pyonephrosis: Principal | ICD-10-CM | POA: Diagnosis present

## 2017-06-26 DIAGNOSIS — I129 Hypertensive chronic kidney disease with stage 1 through stage 4 chronic kidney disease, or unspecified chronic kidney disease: Secondary | ICD-10-CM | POA: Diagnosis present

## 2017-06-26 DIAGNOSIS — Z87442 Personal history of urinary calculi: Secondary | ICD-10-CM

## 2017-06-26 DIAGNOSIS — G459 Transient cerebral ischemic attack, unspecified: Secondary | ICD-10-CM

## 2017-06-26 DIAGNOSIS — F419 Anxiety disorder, unspecified: Secondary | ICD-10-CM

## 2017-06-26 DIAGNOSIS — N179 Acute kidney failure, unspecified: Secondary | ICD-10-CM | POA: Diagnosis not present

## 2017-06-26 DIAGNOSIS — F329 Major depressive disorder, single episode, unspecified: Secondary | ICD-10-CM | POA: Diagnosis present

## 2017-06-26 DIAGNOSIS — Z8619 Personal history of other infectious and parasitic diseases: Secondary | ICD-10-CM

## 2017-06-26 DIAGNOSIS — Z91048 Other nonmedicinal substance allergy status: Secondary | ICD-10-CM

## 2017-06-26 DIAGNOSIS — I10 Essential (primary) hypertension: Secondary | ICD-10-CM | POA: Diagnosis not present

## 2017-06-26 DIAGNOSIS — Z8249 Family history of ischemic heart disease and other diseases of the circulatory system: Secondary | ICD-10-CM

## 2017-06-26 DIAGNOSIS — Z9221 Personal history of antineoplastic chemotherapy: Secondary | ICD-10-CM

## 2017-06-26 DIAGNOSIS — K4090 Unilateral inguinal hernia, without obstruction or gangrene, not specified as recurrent: Secondary | ICD-10-CM | POA: Diagnosis present

## 2017-06-26 DIAGNOSIS — Z8673 Personal history of transient ischemic attack (TIA), and cerebral infarction without residual deficits: Secondary | ICD-10-CM

## 2017-06-26 DIAGNOSIS — N10 Acute tubulo-interstitial nephritis: Secondary | ICD-10-CM

## 2017-06-26 DIAGNOSIS — N12 Tubulo-interstitial nephritis, not specified as acute or chronic: Secondary | ICD-10-CM

## 2017-06-26 DIAGNOSIS — H409 Unspecified glaucoma: Secondary | ICD-10-CM | POA: Diagnosis present

## 2017-06-26 DIAGNOSIS — E785 Hyperlipidemia, unspecified: Secondary | ICD-10-CM | POA: Diagnosis present

## 2017-06-26 DIAGNOSIS — Z888 Allergy status to other drugs, medicaments and biological substances status: Secondary | ICD-10-CM

## 2017-06-26 DIAGNOSIS — Z923 Personal history of irradiation: Secondary | ICD-10-CM

## 2017-06-26 DIAGNOSIS — Z882 Allergy status to sulfonamides status: Secondary | ICD-10-CM

## 2017-06-26 DIAGNOSIS — M545 Low back pain: Secondary | ICD-10-CM | POA: Diagnosis present

## 2017-06-26 LAB — CBC
HCT: 36.8 % — ABNORMAL LOW (ref 39.0–52.0)
HEMOGLOBIN: 11.9 g/dL — AB (ref 13.0–17.0)
MCH: 34.1 pg — AB (ref 26.0–34.0)
MCHC: 32.3 g/dL (ref 30.0–36.0)
MCV: 105.4 fL — AB (ref 78.0–100.0)
PLATELETS: 149 10*3/uL — AB (ref 150–400)
RBC: 3.49 MIL/uL — AB (ref 4.22–5.81)
RDW: 12.4 % (ref 11.5–15.5)
WBC: 7.2 10*3/uL (ref 4.0–10.5)

## 2017-06-26 LAB — URINALYSIS, ROUTINE W REFLEX MICROSCOPIC
Bilirubin Urine: NEGATIVE
Glucose, UA: NEGATIVE mg/dL
Ketones, ur: NEGATIVE mg/dL
Nitrite: NEGATIVE
Protein, ur: NEGATIVE mg/dL
Specific Gravity, Urine: 1.015 (ref 1.005–1.030)
pH: 7 (ref 5.0–8.0)

## 2017-06-26 LAB — BASIC METABOLIC PANEL
Anion gap: 6 (ref 5–15)
BUN: 19 mg/dL (ref 6–20)
CHLORIDE: 110 mmol/L (ref 101–111)
CO2: 24 mmol/L (ref 22–32)
CREATININE: 2 mg/dL — AB (ref 0.61–1.24)
Calcium: 9.2 mg/dL (ref 8.9–10.3)
GFR calc non Af Amer: 31 mL/min — ABNORMAL LOW (ref >60)
GFR, EST AFRICAN AMERICAN: 36 mL/min — AB (ref >60)
GLUCOSE: 125 mg/dL — AB (ref 65–99)
Potassium: 4.5 mmol/L (ref 3.5–5.1)
Sodium: 140 mmol/L (ref 135–145)

## 2017-06-26 LAB — POC OCCULT BLOOD, ED: Fecal Occult Bld: NEGATIVE

## 2017-06-26 MED ORDER — ATENOLOL 50 MG PO TABS
50.0000 mg | ORAL_TABLET | Freq: Every day | ORAL | Status: DC
  Administered 2017-06-27 – 2017-06-29 (×3): 50 mg via ORAL
  Filled 2017-06-26 (×3): qty 1

## 2017-06-26 MED ORDER — SODIUM CHLORIDE 0.9 % IV SOLN
1.0000 g | Freq: Once | INTRAVENOUS | Status: AC
  Administered 2017-06-26: 1 g via INTRAVENOUS
  Filled 2017-06-26: qty 10

## 2017-06-26 MED ORDER — ASPIRIN 81 MG PO CHEW
81.0000 mg | CHEWABLE_TABLET | Freq: Every day | ORAL | Status: DC
  Administered 2017-06-27 – 2017-06-29 (×3): 81 mg via ORAL
  Filled 2017-06-26 (×3): qty 1

## 2017-06-26 MED ORDER — BRIMONIDINE TARTRATE 0.2 % OP SOLN
1.0000 [drp] | Freq: Two times a day (BID) | OPHTHALMIC | Status: DC
  Administered 2017-06-27 – 2017-06-29 (×6): 1 [drp] via OPHTHALMIC
  Filled 2017-06-26 (×3): qty 5

## 2017-06-26 MED ORDER — B COMPLEX-C PO TABS
1.0000 | ORAL_TABLET | Freq: Every day | ORAL | Status: DC
  Administered 2017-06-27 – 2017-06-29 (×3): 1 via ORAL
  Filled 2017-06-26 (×3): qty 1

## 2017-06-26 MED ORDER — LACTATED RINGERS IV BOLUS
1000.0000 mL | Freq: Once | INTRAVENOUS | Status: AC
Start: 2017-06-26 — End: 2017-06-26
  Administered 2017-06-26: 1000 mL via INTRAVENOUS

## 2017-06-26 MED ORDER — SODIUM CHLORIDE 0.9 % IV SOLN
1.0000 g | Freq: Two times a day (BID) | INTRAVENOUS | Status: DC
  Administered 2017-06-26 – 2017-06-28 (×3): 1 g via INTRAVENOUS
  Filled 2017-06-26 (×4): qty 1

## 2017-06-26 MED ORDER — ALPRAZOLAM 0.25 MG PO TABS
0.5000 mg | ORAL_TABLET | Freq: Every day | ORAL | Status: DC | PRN
  Administered 2017-06-27 – 2017-06-28 (×2): 0.5 mg via ORAL
  Filled 2017-06-26 (×2): qty 2

## 2017-06-26 MED ORDER — ACETAMINOPHEN 650 MG RE SUPP
650.0000 mg | Freq: Four times a day (QID) | RECTAL | Status: DC | PRN
Start: 2017-06-26 — End: 2017-06-29

## 2017-06-26 MED ORDER — ONDANSETRON HCL 4 MG/2ML IJ SOLN
4.0000 mg | Freq: Four times a day (QID) | INTRAMUSCULAR | Status: DC | PRN
  Administered 2017-06-28: 4 mg via INTRAVENOUS
  Filled 2017-06-26: qty 2

## 2017-06-26 MED ORDER — POLYETHYLENE GLYCOL 3350 17 G PO PACK: 17.0000 g | PACK | Freq: Every day | ORAL | Status: DC | PRN

## 2017-06-26 MED ORDER — ENOXAPARIN SODIUM 40 MG/0.4ML ~~LOC~~ SOLN
40.0000 mg | SUBCUTANEOUS | Status: DC
  Administered 2017-06-27 – 2017-06-29 (×3): 40 mg via SUBCUTANEOUS
  Filled 2017-06-26 (×3): qty 0.4

## 2017-06-26 MED ORDER — HYDROCODONE-ACETAMINOPHEN 10-325 MG PO TABS
1.0000 | ORAL_TABLET | Freq: Four times a day (QID) | ORAL | Status: DC | PRN
  Administered 2017-06-27 – 2017-06-29 (×8): 1 via ORAL
  Filled 2017-06-26 (×8): qty 1

## 2017-06-26 MED ORDER — AMLODIPINE BESYLATE 5 MG PO TABS
10.0000 mg | ORAL_TABLET | Freq: Every day | ORAL | Status: DC
  Administered 2017-06-27 – 2017-06-29 (×3): 10 mg via ORAL
  Filled 2017-06-26 (×3): qty 2

## 2017-06-26 MED ORDER — ONDANSETRON HCL 4 MG PO TABS: 4.0000 mg | ORAL_TABLET | Freq: Four times a day (QID) | ORAL | Status: DC | PRN

## 2017-06-26 MED ORDER — TIMOLOL MALEATE 0.5 % OP SOLN
1.0000 [drp] | Freq: Two times a day (BID) | OPHTHALMIC | Status: DC
  Administered 2017-06-27 – 2017-06-29 (×6): 1 [drp] via OPHTHALMIC
  Filled 2017-06-26 (×2): qty 5

## 2017-06-26 MED ORDER — BRINZOLAMIDE 1 % OP SUSP
1.0000 [drp] | Freq: Two times a day (BID) | OPHTHALMIC | Status: DC
Start: 2017-06-27 — End: 2017-06-29
  Administered 2017-06-27 – 2017-06-29 (×6): 1 [drp] via OPHTHALMIC
  Filled 2017-06-26: qty 10

## 2017-06-26 MED ORDER — ACETAMINOPHEN 325 MG PO TABS: 650.0000 mg | ORAL_TABLET | Freq: Four times a day (QID) | ORAL | Status: DC | PRN

## 2017-06-26 NOTE — ED Triage Notes (Signed)
Pt reports pain with urination for over 1 week. Denies penile discharge. Denies any new pains to his lower back or abdomen.

## 2017-06-26 NOTE — Progress Notes (Signed)
Pharmacy Antibiotic Note  [name] is a 77 y.o. male admitted on 06/26/2017 with UTI.  Pharmacy has been consulted for meropenem dosing.  Plan: Meropenem 1gm IV q12 hours F/u renal function, cultures and clinical course  Height: 6' (182.9 cm) Weight: 218 lb (98.9 kg) IBW/kg (Calculated) : 77.6  Temp (24hrs), Avg:99.2 F (37.3 C), Min:99.2 F (37.3 C), Max:99.2 F (37.3 C)  Recent Labs  Lab 06/26/17 1919  WBC 7.2  CREATININE 2.00*    Estimated Creatinine Clearance: 38.3 mL/min (A) (by C-G formula based on SCr of 2 mg/dL (H)).    Allergies  Allergen Reactions  . Tape Other (See Comments)    Redness and blisters/paper tape-not latex  . Doxycycline Hyclate Other (See Comments)  . Sulfa Antibiotics Hives     Thank you for allowing pharmacy to be a part of this patient's care.  Excell Seltzer [place]et 06/26/2017 11:23 PM

## 2017-06-26 NOTE — ED Provider Notes (Signed)
[facility] EMERGENCY DEPARTMENT Provider Note   CSN: [id number] Arrival date & time: 06/26/17  1739  History   Chief Complaint Chief Complaint  Patient presents with  . Urinary Tract Infection    HPI [name] is a 77 y.o. male.  The history is provided by the patient.   77 yo M with PMHx of nephrolithiasis, UTIs, who presents with 2 weeks gradually worsening dysuria and urinary frequency. Symptoms accompanied by decreased PO intake, chills, fever (unknown Tmax). Similar symptoms in the past with UTI. Does endorse intermittent dark stools over last few months. Denies n/v/diarrhea. Denies abd pain.   Past Medical History:  Diagnosis Date  . Anxiety   . Arthritis   . Bilateral flank pain    DUE TO KIDNEY STONES  . Chronic back pain   . Dyslipidemia   . Eczema   . Elevated PSA 01/11/2012   25.77  . Frequency of urination   . Glaucoma PT STATES RX RAN OUT  . Glaucoma   . Hematuria   . Herniated disc   . History of kidney stones   . History of radiation therapy 06/07/12-08/02/12   prostate, 7800 cGy/40 sessions/ 5600cGy pelvic lymph nodes/40 sessions  . History of shingles   . History of TIA (transient ischemic attack) 2010--  NO RESIDUAL   NONE SINCE  . Hypertension   . Nocturia   . Prostate cancer ([place]) 03/2012  . Renal calculi BILATERAL   . Urgency of urination   . Weakness of both legs     Patient Active Problem List   Diagnosis Date Noted  . Complicated UTI (urinary tract infection) 06/26/2017  . Acute renal failure superimposed on stage 3 chronic kidney disease ([place]) 06/26/2017  . TIA (transient ischemic attack) 06/26/2017  . Anxiety 06/26/2017  . Prostate cancer ([place]) 05/01/2012  . Other and unspecified hyperlipidemia 04/12/2012  . Chest pain, unspecified 04/12/2012  . 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 Essential hypertension, benign 04/11/2012    Past Surgical History:  Procedure Laterality Date  . BILIARY STENT PLACEMENT   06/15/2011   Procedure: BILIARY STENT PLACEMENT;  Surgeon: Franchot Gallo, MD;  Location[facility];  Service: Urology;  Laterality: Bilateral;  . CYSTO/ BILATERAL RETROGRADE PYELOGRAM/ LEFT URETERSCOPIC STONE EXTRACTION  11-14-2005   LEFT URETERAL CALCULUS  . CYSTO/ BILATERAL URETERAL STENTS  11-06-2005  . CYSTO/ LEFT RETROGRADE PYELOGRAM/  LEFT STENT PLACMENT  05-21-2011  . CYSTOSCOPY W/ URETERAL STENT REMOVAL  06/15/2011   Procedure: CYSTOSCOPY WITH STENT REMOVAL;  Surgeon: Franchot Gallo, MD;  Location[facility];  Service: Urology;  Laterality: Left;  . EXTRACORPOREAL SHOCK WAVE LITHOTRIPSY    . INTERNAL URETHROTOMY/ TURP  12-23-2002   BPH W/ STRICTURE  . PERCUTANEOUS NEPHROLITHOTOMY  12-10-2007  &  11-07-2005   RIGHT RENAL CALCULI  . REPAIR LEFT INGUINAL HERNIA W/ MESH  12-23-2002  . RIGHT URETERAL STENT PLACEMENT  02-10-2003  . RIGHT URETEROSCOPIC STONE EXTRACTION  05-29-2003  . TRANSTHORACIC ECHOCARDIOGRAM  12-19-2008   LVSF NORMAL/ EF 40-98%/ GRADE I DIASTOLIC DYSFUNCTION/   . TRANSURETHRAL RESECTION OF BLADDER  2004  . URETEROSCOPY  06/15/2011   Procedure: URETEROSCOPY;  Surgeon: Franchot Gallo, MD;  Location[facility];  Service: Urology;  Laterality: Bilateral;        Home Medications    Prior to Admission medications   Medication Sig Start Date End Date Taking? Authorizing Provider  ALPRAZolam Duanne Moron) 0.5 MG tablet Take 0.5  mg by mouth daily as needed for anxiety. Anxiety/sleep   Yes [provider]  amLODipine (NORVASC) 10 MG tablet Take 1 tablet (10 mg total) by mouth daily. 03/16/17  Yes Jaynee Eagles, PA-C  atenolol (TENORMIN) 50 MG tablet Take 1 tablet (50 mg total) by mouth daily. 03/16/17  Yes Jaynee Eagles, PA-C  DULoxetine (CYMBALTA) 30 MG capsule Take 30 mg by mouth daily.   Yes [provider]  HYDROcodone-acetaminophen (NORCO) 10-325 MG per tablet Take 1 tablet by mouth 4 (four) times daily  as needed for moderate pain or severe pain.  02/12/14  Yes [provider]  SIMBRINZA 1-0.2 % SUSP Place 1 drop into both eyes 2 (two) times daily. 02/19/17  Yes [provider]  timolol (TIMOPTIC) 0.5 % ophthalmic solution Place 1 drop into both eyes 2 (two) times daily as needed (pressure).  01/11/17  Yes [provider]    Family History Family History  Problem Relation Age of Onset  . Hypertension Mother   . Stroke Father   . Hypertension Sister   . Hypertension Brother     Social History Social History   Tobacco Use  . Smoking status: Former Smoker    Years: 40.00    Types: Cigarettes    Last attempt to quit: 06/10/1990    Years since quitting: 27.0  . Smokeless tobacco: Never Used  Substance Use Topics  . Alcohol use: No  . Drug use: No     Allergies   Tape; Doxycycline hyclate; and Sulfa antibiotics   Review of Systems Review of Systems  Constitutional: Positive for chills, fatigue and fever.  HENT: Negative for ear pain and sore throat.   Eyes: Negative for pain and visual disturbance.  Respiratory: Negative for cough and shortness of breath.   Cardiovascular: Negative for chest pain and palpitations.  Gastrointestinal: Positive for blood in stool. Negative for abdominal pain and vomiting.  Genitourinary: Positive for dysuria and frequency. Negative for hematuria.  Musculoskeletal: Negative for arthralgias and back pain.  Skin: Negative for color change and rash.  Neurological: Negative for seizures and syncope.  All other systems reviewed and are negative.    Physical Exam Updated Vital Signs BP (!) 156/94 (BP Location: Left Arm)   Pulse 87   Temp 99.2 F (37.3 C) (Oral)   Resp 17   Ht 6' (1.829 m)   Wt 95.6 kg (210 lb 12.2 oz) Comment: bed scale  SpO2 100%   BMI 28.58 kg/m   Physical Exam  Constitutional: He appears well-developed and well-nourished.  HENT:  Head: Normocephalic and atraumatic.  Mouth/Throat: Oropharynx  is clear and moist.  Eyes: Conjunctivae and EOM are normal.  Neck: Neck supple.  Cardiovascular: Normal rate, regular rhythm and intact distal pulses.  No murmur heard. Pulmonary/Chest: Effort normal and breath sounds normal. No stridor. No respiratory distress.  Abdominal: Soft. He exhibits no distension. There is no tenderness. There is no rebound and no guarding.  Genitourinary: Penis normal. Rectal exam shows guaiac negative stool. Right testis shows swelling. Left testis shows swelling. Uncircumcised.  Genitourinary Comments: Chaperone present in room for exam; light brown stool on DRE  Musculoskeletal: He exhibits no edema.  Neurological: He is alert.  Skin: Skin is warm and dry.  Psychiatric: He has a normal mood and affect.  Nursing note and vitals reviewed.    ED Treatments / Results  Labs (all labs ordered are listed, but only abnormal results are displayed) Labs Reviewed  URINALYSIS, [place]  MICROSCOPIC - Abnormal; Notable for the following components:      Result Value   APPearance HAZY (*)    Hgb urine dipstick SMALL (*)    Leukocytes, UA LARGE (*)    WBC, UA >50 (*)    Bacteria, UA MANY (*)    All other components within normal limits  BASIC METABOLIC PANEL - Abnormal; Notable for the following components:   Glucose, Bld 125 (*)    Creatinine, Ser 2.00 (*)    GFR calc non Af Amer 31 (*)    GFR calc Af Amer 36 (*)    All other components within normal limits  CBC - Abnormal; Notable for the following components:   RBC 3.49 (*)    Hemoglobin 11.9 (*)    HCT 36.8 (*)    MCV 105.4 (*)    MCH 34.1 (*)    Platelets 149 (*)    All other components within normal limits  CULTURE, BLOOD (ROUTINE X 2)  URINE CULTURE  CULTURE, BLOOD (ROUTINE X 2)  BASIC METABOLIC PANEL  CBC  POC OCCULT BLOOD, ED    EKG None  Radiology Ct Abdomen Pelvis Wo Contrast  Result Date: 06/26/2017 CLINICAL DATA:  77 y/o M; nausea, vomiting constipation, and sensation of  fullness for a month and painful urination for 1 week. Hematuria. EXAM: CT ABDOMEN AND PELVIS WITHOUT CONTRAST TECHNIQUE: Multidetector CT imaging of the abdomen and pelvis was performed following the standard protocol without IV contrast. COMPARISON:  06/26/2014 CT abdomen and pelvis. FINDINGS: Lower chest: No acute abnormality. Hepatobiliary: Multiple hepatic cysts the largest at the dome measuring 22 mm are stable. No cholelithiasis, gallbladder thickening, or biliary ductal dilatation. Pancreas: Unremarkable. No pancreatic ductal dilatation or surrounding inflammatory changes. Spleen: Normal in size without focal abnormality. Adrenals/Urinary Tract: Normal adrenal glands. Large right kidney caliceal stone measuring 10 x 16 mm. Punctate left kidney lower pole stone. No right hydronephrosis. No focal kidney lesion identified. Moderate left hydronephrosis and perinephric stranding. No obstructing stone identified. Stomach/Bowel: Stomach is within normal limits. Appendix appears normal. No evidence of bowel wall thickening, distention, or inflammatory changes. Mild sigmoid diverticulosis without findings of acute diverticulitis. Vascular/Lymphatic: Aortic atherosclerosis. No enlarged abdominal or pelvic lymph nodes. Reproductive: [place] densities within the prostate gland, probably prostate seeds. Other: Small right inguinal hernia containing fat. Paraumbilical hernia containing fat mildly increased in size. Musculoskeletal: No fracture is seen. Transitional L5 vertebral body. IMPRESSION: 1. Moderate left hydronephrosis and perinephric stranding. No obstructing stone identified. Findings may represent a recently passed stone or infection of the renal collecting system. 2. Right kidney nonobstructing caliceal stones measuring up to 16 mm. Punctate left kidney lower pole stone. 3. Multiple stable hepatic cysts. 4. Mild increase in size of paraumbilical and stable small right inguinal hernias containing fat.  Electronically Signed   By: Kristine Garbe M.D.   On: 06/26/2017 22:47    Procedures Procedures (including critical care time)  Medications Ordered in ED Medications  meropenem (MERREM) 1 g in sodium chloride 0.9 % 100 mL IVPB (0 g Intravenous Stopped 06/27/17 0034)  ALPRAZolam (XANAX) tablet 0.5 mg (has no administration in time range)  amLODipine (NORVASC) tablet 10 mg (has no administration in time range)  aspirin chewable tablet 81 mg (has no administration in time range)  atenolol (TENORMIN) tablet 50 mg (has no administration in time range)  B-complex with vitamin C tablet 1 tablet (has no administration in time range)  HYDROcodone-acetaminophen (NORCO) 10-325 MG per tablet 1 tablet (1  tablet Oral Given 06/27/17 0100)  timolol (TIMOPTIC) 0.5 % ophthalmic solution 1 drop (1 drop Both Eyes Given 06/27/17 0100)  brinzolamide (AZOPT) 1 % ophthalmic suspension 1 drop (1 drop Both Eyes Given 06/27/17 0128)  brimonidine (ALPHAGAN) 0.2 % ophthalmic solution 1 drop (1 drop Both Eyes Given 06/27/17 0100)  enoxaparin (LOVENOX) injection 40 mg (has no administration in time range)  acetaminophen (TYLENOL) tablet 650 mg (has no administration in time range)    Or  acetaminophen (TYLENOL) suppository 650 mg (has no administration in time range)  polyethylene glycol (MIRALAX / GLYCOLAX) packet 17 g (has no administration in time range)  ondansetron (ZOFRAN) tablet 4 mg (has no administration in time range)    Or  ondansetron (ZOFRAN) injection 4 mg (has no administration in time range)  hydrALAZINE (APRESOLINE) injection 5 mg (has no administration in time range)  zolpidem (AMBIEN) tablet 5 mg (has no administration in time range)  lactated ringers bolus 1,000 mL (0 mLs Intravenous Stopped 06/26/17 2300)  cefTRIAXone (ROCEPHIN) 1 g in sodium chloride 0.9 % 100 mL IVPB (0 g Intravenous Stopped 06/26/17 2300)     Initial Impression / Assessment and Plan / ED Course  I have reviewed the  triage vital signs and the nursing notes.  Pertinent labs & imaging results that were available during my care of the patient were reviewed by me and considered in my medical decision making (see chart for details).     SHAWNMICHAEL PARENTEAU is a 77 y.o. male with PMHx of nephrolithiasis, UTIs who p/w urinary symptoms x 2 wks. Reviewed and confirmed nursing documentation for past medical history, family history, social history. VS T99.2, otherwise wnl. Exam remarkable for large testicles bilaterally that pt states is chronic. Ddx includes UTI/pyelonephritis. Though no CVA tenderness or flank pain, question nephrolithiasis given extensive history.   BMP with AKI, Cr 2 from baseline 1.5-1.7, otherwise unremarkable. CBC with no leukocytosis, new macrocytic anemia with hgb 11.9. UA with small hgb, large leuks, >50 WBC. IVF, ceftriaxone ordered. Urine cultures pending. CT abd/pelvis w/o contrast with moderate L hydronephrosis and perinephric stranding, no obstructive stone. R kidney with non-obstructing stones.  Old records reviewed. Labs reviewed by me and used in the medical decision making.  Imaging viewed and interpreted by me and used in the medical decision making (formal interpretation from radiologist). Admitted to hospitalist.    Final Clinical Impressions(s) / ED Diagnoses   Final diagnoses:  Pyelonephritis     Norm Salt, MD 06/27/17 0345    Deno Etienne, DO 06/27/17 [place], [place], DO 07/04/17 [phone]

## 2017-06-26 NOTE — H&P (Signed)
History and Physical    [name] UMP:[id number] DOB: 01/08/1940 DOA: 06/26/2017  Referring MD/NP/PA:   PCP: Patient, No Pcp Per   Patient coming from:  The patient is coming from home.  At baseline, pt is independent for most of ADL.   Chief Complaint: Dysuria  HPI: [name] is a 77 y.o. male with medical history significant of kidney stone (s/p of bilateral stent placement by Dr. Diona Fanti), CKD-3, prostate cancer (s/p of surgery and radiation therapy per pt), chronic back pain, hypertension, hyperlipidemia, TIA, anxiety, history of positive urine culture for ESBL, who presents with dysuria.  Patient states that he has been having dysuria, burning on urination and increased urinary frequency in the past 2 weeks.  He also has intermittent fever and chills.  Has nausea, but no vomiting, diarrhea or abdominal pain.  Patient states that he had left flank pain a week ago, which has resolved.  Currently patient does not have flank pain.  Patient denies chest pain, shortness of breath, cough.  No unilateral weakness.  Did not note blood in urine.  ED Course: pt was found to have positive urinalysis with large amount of leukocyte and >50 of WBC in urine, negative FOBT, worsening renal function, temperature 99.2, no tachycardia, no tachypnea, oxygen saturation 100% on room air.  Patient is admitted to [place] bed as inpatient.  CT abdomen/pelvis showed: 1. Moderate left hydronephrosis and perinephric stranding. No obstructing stone identified. Findings may represent a recently passed stone or infection of the renal collecting system. 2. Right kidney nonobstructing caliceal stones measuring up to 16 mm. Punctate left kidney lower pole stone. 3. Multiple stable hepatic cysts. 4. Mild increase in size of paraumbilical and stable small right inguinal hernias containing fat.  Review of Systems:   General: has subjective fevers, chills, no body weight gain, has fatigue HEENT: no blurry  vision, hearing changes or sore throat Respiratory: no dyspnea, coughing, wheezing CV: no chest pain, no palpitations GI: has nausea, no vomiting, abdominal pain, diarrhea, constipation GU: no dysuria, burning on urination, increased urinary frequency, hematuria  Ext: no leg edema Neuro: no unilateral weakness, numbness, or tingling, no vision change or hearing loss Skin: no rash, no skin tear. MSK: No muscle spasm, no deformity, no limitation of range of movement in spin Heme: No easy bruising.  Travel history: No recent long distant travel.  Allergy:  Allergies  Allergen Reactions  . Tape Other (See Comments)    Redness and blisters/paper tape-not latex  . Doxycycline Hyclate Other (See Comments)    Mental changes   . Sulfa Antibiotics Hives    Past Medical History:  Diagnosis Date  . Anxiety   . Arthritis   . Bilateral flank pain    DUE TO KIDNEY STONES  . Chronic back pain   . Dyslipidemia   . Eczema   . Elevated PSA 01/11/2012   25.77  . Frequency of urination   . Glaucoma PT STATES RX RAN OUT  . Glaucoma   . Hematuria   . Herniated disc   . History of kidney stones   . History of radiation therapy 06/07/12-08/02/12   prostate, 7800 cGy/40 sessions/ 5600cGy pelvic lymph nodes/40 sessions  . History of shingles   . History of TIA (transient ischemic attack) 2010--  NO RESIDUAL   NONE SINCE  . Hypertension   . Nocturia   . Prostate cancer ([place]) 03/2012  . Renal calculi BILATERAL   . Urgency of urination   . Weakness  of both legs     Past Surgical History:  Procedure Laterality Date  . BILIARY STENT PLACEMENT  06/15/2011   Procedure: BILIARY STENT PLACEMENT;  Surgeon: Franchot Gallo, MD;  Location: [facility];  Service: Urology;  Laterality: Bilateral;  . CYSTO/ BILATERAL RETROGRADE PYELOGRAM/ LEFT URETERSCOPIC STONE EXTRACTION  11-14-2005   LEFT URETERAL CALCULUS  . CYSTO/ BILATERAL URETERAL STENTS  11-06-2005  . CYSTO/ LEFT RETROGRADE  PYELOGRAM/  LEFT STENT PLACMENT  05-21-2011  . CYSTOSCOPY W/ URETERAL STENT REMOVAL  06/15/2011   Procedure: CYSTOSCOPY WITH STENT REMOVAL;  Surgeon: Franchot Gallo, MD;  Location: [facility];  Service: Urology;  Laterality: Left;  . EXTRACORPOREAL SHOCK WAVE LITHOTRIPSY    . INTERNAL URETHROTOMY/ TURP  12-23-2002   BPH W/ STRICTURE  . PERCUTANEOUS NEPHROLITHOTOMY  12-10-2007  &  11-07-2005   RIGHT RENAL CALCULI  . REPAIR LEFT INGUINAL HERNIA W/ MESH  12-23-2002  . RIGHT URETERAL STENT PLACEMENT  02-10-2003  . RIGHT URETEROSCOPIC STONE EXTRACTION  05-29-2003  . TRANSTHORACIC ECHOCARDIOGRAM  12-19-2008   LVSF NORMAL/ EF 10-25%/ GRADE I DIASTOLIC DYSFUNCTION/   . TRANSURETHRAL RESECTION OF BLADDER  2004  . URETEROSCOPY  06/15/2011   Procedure: URETEROSCOPY;  Surgeon: Franchot Gallo, MD;  Location: [facility];  Service: Urology;  Laterality: Bilateral;    Social History:  reports that he quit smoking about 27 years ago. His smoking use included cigarettes. He quit after 40.00 years of use. He has never used smokeless tobacco. He reports that he does not drink alcohol or use drugs.  Family History:  Family History  Problem Relation Age of Onset  . Hypertension Mother   . Stroke Father   . Hypertension Sister   . Hypertension Brother      Prior to Admission medications   Medication Sig Start Date End Date Taking? Authorizing Provider  ALPRAZolam Duanne Moron) 0.5 MG tablet Take 0.5 mg by mouth daily as needed for anxiety. Anxiety/sleep    [provider]  amLODipine (NORVASC) 10 MG tablet Take 1 tablet (10 mg total) by mouth daily. 03/16/17   Jaynee Eagles, PA-C  aspirin EC 81 MG EC tablet Take 1 tablet (81 mg total) by mouth daily. 04/11/12   [name], MD  atenolol (TENORMIN) 50 MG tablet Take 1 tablet (50 mg total) by mouth daily. 03/16/17   Jaynee Eagles, PA-C  b complex vitamins tablet Take 1 tablet by mouth daily.     [provider]  HYDROcodone-acetaminophen (NORCO) 10-325 MG per tablet Take 1 tablet by mouth 4 (four) times daily as needed for moderate pain or severe pain.  02/12/14   [provider]  SIMBRINZA 1-0.2 % SUSP Place 1 drop into both eyes 2 (two) times daily. 02/19/17   [provider]  timolol (TIMOPTIC) 0.5 % ophthalmic solution Place 1 drop into both eyes 2 (two) times daily as needed. 01/11/17   [provider]    Physical Exam: Vitals:   06/27/17 0000 06/27/17 0049 06/27/17 0050 06/27/17 0435  BP: (!) 151/92 (!) 156/94  112/62  Pulse: 88 87  84  Resp:  17  18  Temp:  99.2 F (37.3 C)  98.6 F (37 C)  TempSrc:  Oral  Oral  SpO2: 99% 100%  100%  Weight:   95.6 kg (210 lb 12.2 oz)   Height:   6' (1.829 m)    General: Not in acute distress HEENT:       Eyes:  PERRL, EOMI, no scleral icterus.       ENT: No discharge from the ears and nose, no pharynx injection, no tonsillar enlargement.        Neck: No JVD, no bruit, no mass felt. Heme: No neck lymph node enlargement. Cardiac: S1/S2, RRR, No murmurs, No gallops or rubs. Respiratory: No rales, wheezing, rhonchi or rubs. GI: Soft, nondistended, nontender, no rebound pain, no organomegaly, BS present. GU: No hematuria Ext: No pitting leg edema bilaterally. 2+DP/PT pulse bilaterally. Musculoskeletal: No joint deformities, No joint redness or warmth, no limitation of ROM in spin. Skin: No rashes.  Neuro: Alert, oriented X3, cranial nerves II-XII grossly intact, moves all extremities normally. Psych: Patient is not psychotic, no suicidal or hemocidal ideation.  Labs on Admission: I have personally reviewed following labs and imaging studies  CBC: Recent Labs  Lab 06/26/17 1919  WBC 7.2  HGB 11.9*  HCT 36.8*  MCV 105.4*  PLT 947*   Basic Metabolic Panel: Recent Labs  Lab 06/26/17 1919  NA 140  K 4.5  CL 110  CO2 24  GLUCOSE 125*  BUN 19  CREATININE 2.00*  CALCIUM 9.2   GFR: Estimated  Creatinine Clearance: 37.7 mL/min (A) (by C-G formula based on SCr of 2 mg/dL (H)). Liver Function Tests: No results for input(s): AST, ALT, ALKPHOS, BILITOT, PROT, ALBUMIN in the last 168 hours. No results for input(s): LIPASE, AMYLASE in the last 168 hours. No results for input(s): AMMONIA in the last 168 hours. Coagulation Profile: No results for input(s): INR, PROTIME in the last 168 hours. Cardiac Enzymes: No results for input(s): CKTOTAL, CKMB, CKMBINDEX, TROPONINI in the last 168 hours. BNP (last 3 results) No results for input(s): PROBNP in the last 8760 hours. HbA1C: No results for input(s): HGBA1C in the last 72 hours. CBG: No results for input(s): GLUCAP in the last 168 hours. Lipid Profile: No results for input(s): CHOL, HDL, LDLCALC, TRIG, CHOLHDL, LDLDIRECT in the last 72 hours. Thyroid Function Tests: No results for input(s): TSH, T4TOTAL, FREET4, T3FREE, THYROIDAB in the last 72 hours. Anemia Panel: No results for input(s): VITAMINB12, FOLATE, FERRITIN, TIBC, IRON, RETICCTPCT in the last 72 hours. Urine analysis:    Component Value Date/Time   COLORURINE YELLOW 06/26/2017 1905   APPEARANCEUR HAZY (A) 06/26/2017 1905   LABSPEC 1.015 06/26/2017 1905   PHURINE 7.0 06/26/2017 1905   GLUCOSEU NEGATIVE 06/26/2017 1905   HGBUR SMALL (A) 06/26/2017 1905   BILIRUBINUR NEGATIVE 06/26/2017 1905   KETONESUR NEGATIVE 06/26/2017 1905   PROTEINUR NEGATIVE 06/26/2017 1905   UROBILINOGEN 0.2 09/24/2014 1336   NITRITE NEGATIVE 06/26/2017 1905   LEUKOCYTESUR LARGE (A) 06/26/2017 1905   Sepsis Labs: @LABRCNTIP (procalcitonin:4,lacticidven:4) ) Recent Results (from the past 240 hour(s))  Culture, blood (Routine X 2) w Reflex to ID Panel     Status: None (Preliminary result)   Collection Time: 06/26/17 11:40 PM  Result Value Ref Range Status   Specimen Description BLOOD LEFT HAND  Final   Special Requests   Final    BOTTLES DRAWN AEROBIC AND ANAEROBIC Blood Culture adequate  volume   Culture PENDING  Incomplete   Report Status PENDING  Incomplete     Radiological Exams on Admission: Ct Abdomen Pelvis Wo Contrast  Result Date: 06/26/2017 CLINICAL DATA:  77 y/o M; nausea, vomiting constipation, and sensation of fullness for a month and painful urination for 1 week. Hematuria. EXAM: CT ABDOMEN AND PELVIS WITHOUT CONTRAST TECHNIQUE: Multidetector CT imaging of the abdomen and pelvis was performed  following the standard protocol without IV contrast. COMPARISON:  06/26/2014 CT abdomen and pelvis. FINDINGS: Lower chest: No acute abnormality. Hepatobiliary: Multiple hepatic cysts the largest at the dome measuring 22 mm are stable. No cholelithiasis, gallbladder thickening, or biliary ductal dilatation. Pancreas: Unremarkable. No pancreatic ductal dilatation or surrounding inflammatory changes. Spleen: Normal in size without focal abnormality. Adrenals/Urinary Tract: Normal adrenal glands. Large right kidney caliceal stone measuring 10 x 16 mm. Punctate left kidney lower pole stone. No right hydronephrosis. No focal kidney lesion identified. Moderate left hydronephrosis and perinephric stranding. No obstructing stone identified. Stomach/Bowel: Stomach is within normal limits. Appendix appears normal. No evidence of bowel wall thickening, distention, or inflammatory changes. Mild sigmoid diverticulosis without findings of acute diverticulitis. Vascular/Lymphatic: Aortic atherosclerosis. No enlarged abdominal or pelvic lymph nodes. Reproductive: Bonney Roussel densities within the prostate gland, probably prostate seeds. Other: Small right inguinal hernia containing fat. Paraumbilical hernia containing fat mildly increased in size. Musculoskeletal: No fracture is seen. Transitional L5 vertebral body. IMPRESSION: 1. Moderate left hydronephrosis and perinephric stranding. No obstructing stone identified. Findings may represent a recently passed stone or infection of the renal collecting system. 2.  Right kidney nonobstructing caliceal stones measuring up to 16 mm. Punctate left kidney lower pole stone. 3. Multiple stable hepatic cysts. 4. Mild increase in size of paraumbilical and stable small right inguinal hernias containing fat. Electronically Signed   By: Kristine Garbe M.D.   On: 06/26/2017 22:47     EKG: Independently reviewed.  Sinus rhythm, QTC 435, early R wave progression, nonspecific T wave change  Assessment/Plan Principal Problem:   Complicated UTI (urinary tract infection) Active Problems:   Essential hypertension, benign   Acute renal failure superimposed on stage 3 chronic kidney disease (HCC)   TIA (transient ischemic attack)   Anxiety   Complicated UTI (urinary tract infection): Currently patient does not have fever or leukocytosis.  Clinically not septic.  Hemodynamically stable. -will admit to med-surg bed as inpt -Meropenem (patient received 1 dose of IV Rocephin in ED) -Follow-up blood culture and urine culture.  Acute renal failure superimposed on stage 3 chronic kidney disease: Baseline creatinine 1.53 on 03/15/2017, his creatinine is 2.00, BUN 19.  Likely due to combination of UTI and hydronephrosis. CT showed moderate left hydronephrosis without obstructing stone and right kidney nonobstructing caliceal stones.  -IV fluids: Patient received 1 L of Ringer's solution -FeNa -f/u renal Fx by BMP  HTN:  -Continue home medications: Amlodipine, atenolol -IV hydralazine prn  Hx of TIA (transient ischemic attack): -Continue aspirin  Depression and anxiety: Stable, no suicidal or homicidal ideations. -Continue home medications: Xanax as needed and Cymbalta   DVT ppx: SQ Lovenox Code Status: Full code Family Communication: None at bed side.   Disposition Plan:  Anticipate discharge back to previous home environment Consults called:  none Admission status:  medical floor/inpt  Date of Service 06/27/2017    Ivor Costa [facility] Pager  [phone]  If 7PM-7AM, please contact night-coverage [email or web address] Password [email or web address] 06/27/2017, 6:18 AM

## 2017-06-26 NOTE — ED Provider Notes (Signed)
Patient placed in Quick Look pathway, seen and evaluated   Chief Complaint: UTI  HPI:   [name] is a 77 y.o. male with hx of elevated PSA, arthritis, chronic back pain glaucoma, urinary frequency, herniated disc, kidney stones, TIA, HTN who presents to the ED for dysuria, frequency and urgency. The symptoms started a week ago and have gotten worse. Patient denies penile discharge. No new back pain or abdominal pain.   ROS:   Physical Exam:  BP (!) 163/96 (BP Location: Right Arm)   Pulse 83   Temp 99.2 F (37.3 C) (Oral)   Resp 18   Ht 6' (1.829 m)   Wt 98.9 kg (218 lb)   SpO2 100%   BMI 29.57 kg/m    Gen: No distress  Neuro: Awake and Alert  Skin: Warm and dry  Abdomen soft nontender, no CVA tenderness      Initiation of care has begun. The patient has been counseled on the process, plan, and necessity for staying for the completion/evaluation, and the remainder of the medical screening examination    Ashley Murrain, NP 06/26/17 [place], [place], DO 06/26/17 2307

## 2017-06-27 ENCOUNTER — Other Ambulatory Visit: Payer: Self-pay

## 2017-06-27 ENCOUNTER — Encounter (HOSPITAL_COMMUNITY): Payer: Self-pay | Admitting: *Deleted

## 2017-06-27 DIAGNOSIS — N390 Urinary tract infection, site not specified: Secondary | ICD-10-CM | POA: Diagnosis not present

## 2017-06-27 DIAGNOSIS — H409 Unspecified glaucoma: Secondary | ICD-10-CM | POA: Diagnosis present

## 2017-06-27 DIAGNOSIS — F419 Anxiety disorder, unspecified: Secondary | ICD-10-CM | POA: Diagnosis present

## 2017-06-27 DIAGNOSIS — N10 Acute tubulo-interstitial nephritis: Secondary | ICD-10-CM | POA: Diagnosis not present

## 2017-06-27 DIAGNOSIS — E785 Hyperlipidemia, unspecified: Secondary | ICD-10-CM | POA: Diagnosis present

## 2017-06-27 DIAGNOSIS — Z79899 Other long term (current) drug therapy: Secondary | ICD-10-CM | POA: Diagnosis not present

## 2017-06-27 DIAGNOSIS — Z882 Allergy status to sulfonamides status: Secondary | ICD-10-CM | POA: Diagnosis not present

## 2017-06-27 DIAGNOSIS — N12 Tubulo-interstitial nephritis, not specified as acute or chronic: Secondary | ICD-10-CM | POA: Diagnosis present

## 2017-06-27 DIAGNOSIS — N136 Pyonephrosis: Secondary | ICD-10-CM | POA: Diagnosis present

## 2017-06-27 DIAGNOSIS — N179 Acute kidney failure, unspecified: Secondary | ICD-10-CM | POA: Diagnosis present

## 2017-06-27 DIAGNOSIS — K4090 Unilateral inguinal hernia, without obstruction or gangrene, not specified as recurrent: Secondary | ICD-10-CM | POA: Diagnosis present

## 2017-06-27 DIAGNOSIS — F329 Major depressive disorder, single episode, unspecified: Secondary | ICD-10-CM | POA: Diagnosis present

## 2017-06-27 DIAGNOSIS — N183 Chronic kidney disease, stage 3 (moderate): Secondary | ICD-10-CM | POA: Diagnosis present

## 2017-06-27 DIAGNOSIS — Z87442 Personal history of urinary calculi: Secondary | ICD-10-CM | POA: Diagnosis not present

## 2017-06-27 DIAGNOSIS — Z9221 Personal history of antineoplastic chemotherapy: Secondary | ICD-10-CM | POA: Diagnosis not present

## 2017-06-27 DIAGNOSIS — Z8673 Personal history of transient ischemic attack (TIA), and cerebral infarction without residual deficits: Secondary | ICD-10-CM | POA: Diagnosis not present

## 2017-06-27 DIAGNOSIS — M545 Low back pain: Secondary | ICD-10-CM | POA: Diagnosis present

## 2017-06-27 DIAGNOSIS — Z91048 Other nonmedicinal substance allergy status: Secondary | ICD-10-CM | POA: Diagnosis not present

## 2017-06-27 DIAGNOSIS — Z8249 Family history of ischemic heart disease and other diseases of the circulatory system: Secondary | ICD-10-CM | POA: Diagnosis not present

## 2017-06-27 DIAGNOSIS — G8929 Other chronic pain: Secondary | ICD-10-CM | POA: Diagnosis present

## 2017-06-27 DIAGNOSIS — Z8619 Personal history of other infectious and parasitic diseases: Secondary | ICD-10-CM | POA: Diagnosis not present

## 2017-06-27 DIAGNOSIS — I129 Hypertensive chronic kidney disease with stage 1 through stage 4 chronic kidney disease, or unspecified chronic kidney disease: Secondary | ICD-10-CM | POA: Diagnosis present

## 2017-06-27 DIAGNOSIS — Z87891 Personal history of nicotine dependence: Secondary | ICD-10-CM | POA: Diagnosis not present

## 2017-06-27 DIAGNOSIS — Z923 Personal history of irradiation: Secondary | ICD-10-CM | POA: Diagnosis not present

## 2017-06-27 DIAGNOSIS — K7689 Other specified diseases of liver: Secondary | ICD-10-CM | POA: Diagnosis present

## 2017-06-27 DIAGNOSIS — Z888 Allergy status to other drugs, medicaments and biological substances status: Secondary | ICD-10-CM | POA: Diagnosis not present

## 2017-06-27 LAB — BASIC METABOLIC PANEL
Anion gap: 4 — ABNORMAL LOW (ref 5–15)
BUN: 18 mg/dL (ref 8–23)
CHLORIDE: 110 mmol/L (ref 98–111)
CO2: 27 mmol/L (ref 22–32)
CREATININE: 1.85 mg/dL — AB (ref 0.61–1.24)
Calcium: 8.6 mg/dL — ABNORMAL LOW (ref 8.9–10.3)
GFR calc non Af Amer: 34 mL/min — ABNORMAL LOW (ref >60)
GFR, EST AFRICAN AMERICAN: 39 mL/min — AB (ref >60)
Glucose, Bld: 130 mg/dL — ABNORMAL HIGH (ref 70–99)
Potassium: 3.9 mmol/L (ref 3.5–5.1)
SODIUM: 141 mmol/L (ref 135–145)

## 2017-06-27 LAB — CBC
HCT: 32.4 % — ABNORMAL LOW (ref 39.0–52.0)
Hemoglobin: 10.4 g/dL — ABNORMAL LOW (ref 13.0–17.0)
MCH: 34.3 pg — ABNORMAL HIGH (ref 26.0–34.0)
MCHC: 32.1 g/dL (ref 30.0–36.0)
MCV: 106.9 fL — AB (ref 78.0–100.0)
PLATELETS: 122 10*3/uL — AB (ref 150–400)
RBC: 3.03 MIL/uL — AB (ref 4.22–5.81)
RDW: 12.4 % (ref 11.5–15.5)
WBC: 4.7 10*3/uL (ref 4.0–10.5)

## 2017-06-27 MED ORDER — HYDRALAZINE HCL 20 MG/ML IJ SOLN: 5.0000 mg | INTRAMUSCULAR | Status: DC | PRN

## 2017-06-27 MED ORDER — DULOXETINE HCL 30 MG PO CPEP
30.0000 mg | ORAL_CAPSULE | Freq: Every day | ORAL | Status: DC
  Administered 2017-06-27: 30 mg via ORAL
  Filled 2017-06-27 (×3): qty 1

## 2017-06-27 MED ORDER — MAGNESIUM CITRATE PO SOLN: 1.0000 | Freq: Every day | ORAL | Status: DC | PRN

## 2017-06-27 MED ORDER — SENNOSIDES-DOCUSATE SODIUM 8.6-50 MG PO TABS
2.0000 | ORAL_TABLET | Freq: Two times a day (BID) | ORAL | Status: DC
  Administered 2017-06-27 – 2017-06-28 (×3): 2 via ORAL
  Filled 2017-06-27 (×5): qty 2

## 2017-06-27 MED ORDER — POLYETHYLENE GLYCOL 3350 17 G PO PACK
17.0000 g | PACK | Freq: Every day | ORAL | Status: DC
  Administered 2017-06-27 – 2017-06-28 (×2): 17 g via ORAL
  Filled 2017-06-27 (×3): qty 1

## 2017-06-27 MED ORDER — ZOLPIDEM TARTRATE 5 MG PO TABS: 5.0000 mg | ORAL_TABLET | Freq: Every evening | ORAL | Status: DC | PRN

## 2017-06-27 NOTE — Care Management Note (Signed)
Case Management Note  Patient Details  Name: [name] MRN: [id number] Date of Birth: 25-Dec-1940  Subjective/Objective:                    Action/Plan:  From home , independent. Will continue to follow for discharge needs. Expected Discharge Date:  06/29/17               Expected Discharge Plan:  Home/Self Care  In-House Referral:     Discharge planning Services  CM Consult  Post Acute Care Choice:  NA Choice offered to:     DME Arranged:    DME Agency:     HH Arranged:    HH Agency:     Status of Service:  In process, will continue to follow  If discussed at Long Length of Stay Meetings, dates discussed:    Additional Comments:  Marilu Favre, RN 06/27/2017, 9:58 AM

## 2017-06-27 NOTE — Progress Notes (Addendum)
PROGRESS NOTE    [name]  MOQ:[id number] DOB: April 24, 1940 DOA: 06/26/2017 PCP: Patient, No Pcp Per  Brief Narrative:  77 year old with past medical history relevant for recurrent nephrolithiasis status post bilateral stent placements in 2014, stage III CKD, prostate cancer status post surgery and radiation, chronic lumbago, hypertension,  TIA, history of ESBL UTI admitted with dysuria and subjective fevers and chills and found to have evidence of moderate left-sided hydronephrosis and pyelonephritis.   Assessment & Plan:   Principal Problem:   Complicated UTI (urinary tract infection) Active Problems:   Essential hypertension, benign   Acute renal failure superimposed on stage 3 chronic kidney disease (HCC)   TIA (transient ischemic attack)   Anxiety   #) Left-sided pyelonephritis with moderate obstruction: No evidence of nephrolithiasis on the left side however this is noted on the right.  With a history of having multiple stones this raises concern for possible ureteral stenosis: - Continue IV meropenem started 06/26/2016 for history of ESBL E. coli -Urology feels no acute intervention needed as no sepsis, AKI etc, will f/u as o/p -Follow-up urine culture obtained 06/26/2017 -Follow-up blood culture obtained 06/26/2017  #) Stage III CKD: Currently creatinine is stable.  Baseline is around 1.5-1.8. -Avoid nephrotoxins  #) Hypertension: -Continue amlodipine 10 mg daily -Continue atenolol 50 mg daily  #) Chronic pain: -Continue duloxetine 30 mg daily -Continue PRN alprazolam -Continue PRN narcotics  #) History of prostate cancer status post chemo radiation: Stable  #) History of TIA: Unclear why patient is not on aspirin. - Continue aspirin 81 mg  Fluids: Gentle IV fluids Letter lites: Monitor and supplement Nutrition: Heart healthy diet  Prophylaxis: Enoxaparin  Disposition: Pending speciation of urine culture and oral antibiotics  Full code  Consultants:    None  Procedures:   None  Antimicrobials:   IV meropenem started 06/26/2017   Subjective: Patient reports he is doing fairly well.  He denies any nausea, vomiting, diarrhea.  His dysuria has improved.  Objective: Vitals:   06/27/17 0000 06/27/17 0049 06/27/17 0050 06/27/17 0435  BP: (!) 151/92 (!) 156/94  112/62  Pulse: 88 87  84  Resp:  17  18  Temp:  99.2 F (37.3 C)  98.6 F (37 C)  TempSrc:  Oral  Oral  SpO2: 99% 100%  100%  Weight:   95.6 kg (210 lb 12.2 oz)   Height:   6' (1.829 m)     Intake/Output Summary (Last 24 hours) at 06/27/2017 1217 Last data filed at 06/27/2017 1025 Gross per 24 hour  Intake 1445.06 ml  Output 200 ml  Net 1245.06 ml   Filed Weights   06/26/17 1850 06/27/17 0050  Weight: 98.9 kg (218 lb) 95.6 kg (210 lb 12.2 oz)    Examination:  General exam: Appears calm and comfortable  Respiratory system: Clear to auscultation. Respiratory effort normal. Cardiovascular system: Regular rate and rhythm, no murmurs Gastrointestinal system: Abdomen is soft, mildly distended, no rebound or guarding, plus bowel sounds. Central nervous system: Alert and oriented. No focal neurological deficits. Extremities: No lower extremity edema. Skin: No rashes over skin visible Psychiatry: Judgement and insight appear normal. Mood & affect appropriate.     Data Reviewed: I have personally reviewed following labs and imaging studies  CBC: Recent Labs  Lab 06/26/17 1919 06/27/17 0608  WBC 7.2 4.7  HGB 11.9* 10.4*  HCT 36.8* 32.4*  MCV 105.4* 106.9*  PLT 149* 354*   Basic Metabolic Panel: Recent Labs  Lab 06/26/17 1919  06/27/17 0608  NA 140 141  K 4.5 3.9  CL 110 110  CO2 24 27  GLUCOSE 125* 130*  BUN 19 18  CREATININE 2.00* 1.85*  CALCIUM 9.2 8.6*   GFR: Estimated Creatinine Clearance: 40.7 mL/min (A) (by C-G formula based on SCr of 1.85 mg/dL (H)). Liver Function Tests: No results for input(s): AST, ALT, ALKPHOS, BILITOT, PROT,  ALBUMIN in the last 168 hours. No results for input(s): LIPASE, AMYLASE in the last 168 hours. No results for input(s): AMMONIA in the last 168 hours. Coagulation Profile: No results for input(s): INR, PROTIME in the last 168 hours. Cardiac Enzymes: No results for input(s): CKTOTAL, CKMB, CKMBINDEX, TROPONINI in the last 168 hours. BNP (last 3 results) No results for input(s): PROBNP in the last 8760 hours. HbA1C: No results for input(s): HGBA1C in the last 72 hours. CBG: No results for input(s): GLUCAP in the last 168 hours. Lipid Profile: No results for input(s): CHOL, HDL, LDLCALC, TRIG, CHOLHDL, LDLDIRECT in the last 72 hours. Thyroid Function Tests: No results for input(s): TSH, T4TOTAL, FREET4, T3FREE, THYROIDAB in the last 72 hours. Anemia Panel: No results for input(s): VITAMINB12, FOLATE, FERRITIN, TIBC, IRON, RETICCTPCT in the last 72 hours. Sepsis Labs: No results for input(s): PROCALCITON, LATICACIDVEN in the last 168 hours.  Recent Results (from the past 240 hour(s))  Culture, blood (Routine X 2) w Reflex to ID Panel     Status: None (Preliminary result)   Collection Time: 06/26/17 11:40 PM  Result Value Ref Range Status   Specimen Description BLOOD LEFT HAND  Final   Special Requests   Final    BOTTLES DRAWN AEROBIC AND ANAEROBIC Blood Culture adequate volume   Culture PENDING  Incomplete   Report Status PENDING  Incomplete         Radiology Studies: Ct Abdomen Pelvis Wo Contrast  Result Date: 06/26/2017 CLINICAL DATA:  77 y/o M; nausea, vomiting constipation, and sensation of fullness for a month and painful urination for 1 week. Hematuria. EXAM: CT ABDOMEN AND PELVIS WITHOUT CONTRAST TECHNIQUE: Multidetector CT imaging of the abdomen and pelvis was performed following the standard protocol without IV contrast. COMPARISON:  06/26/2014 CT abdomen and pelvis. FINDINGS: Lower chest: No acute abnormality. Hepatobiliary: Multiple hepatic cysts the largest at the dome  measuring 22 mm are stable. No cholelithiasis, gallbladder thickening, or biliary ductal dilatation. Pancreas: Unremarkable. No pancreatic ductal dilatation or surrounding inflammatory changes. Spleen: Normal in size without focal abnormality. Adrenals/Urinary Tract: Normal adrenal glands. Large right kidney caliceal stone measuring 10 x 16 mm. Punctate left kidney lower pole stone. No right hydronephrosis. No focal kidney lesion identified. Moderate left hydronephrosis and perinephric stranding. No obstructing stone identified. Stomach/Bowel: Stomach is within normal limits. Appendix appears normal. No evidence of bowel wall thickening, distention, or inflammatory changes. Mild sigmoid diverticulosis without findings of acute diverticulitis. Vascular/Lymphatic: Aortic atherosclerosis. No enlarged abdominal or pelvic lymph nodes. Reproductive: Bonney Roussel densities within the prostate gland, probably prostate seeds. Other: Small right inguinal hernia containing fat. Paraumbilical hernia containing fat mildly increased in size. Musculoskeletal: No fracture is seen. Transitional L5 vertebral body. IMPRESSION: 1. Moderate left hydronephrosis and perinephric stranding. No obstructing stone identified. Findings may represent a recently passed stone or infection of the renal collecting system. 2. Right kidney nonobstructing caliceal stones measuring up to 16 mm. Punctate left kidney lower pole stone. 3. Multiple stable hepatic cysts. 4. Mild increase in size of paraumbilical and stable small right inguinal hernias containing fat. Electronically Signed   By:  Kristine Garbe M.D.   On: 06/26/2017 22:47        Scheduled Meds: . amLODipine  10 mg Oral Daily  . aspirin  81 mg Oral Daily  . atenolol  50 mg Oral Daily  . B-complex with vitamin C  1 tablet Oral Daily  . brimonidine  1 drop Both Eyes BID  . brinzolamide  1 drop Both Eyes BID  . DULoxetine  30 mg Oral Daily  . enoxaparin (LOVENOX) injection  40 mg  Subcutaneous Q24H  . timolol  1 drop Both Eyes BID   Continuous Infusions: . meropenem (MERREM) IV Stopped (06/27/17 0034)     LOS: 0 days    Time spent: 49    Cristy Folks, MD [facility]  If 7PM-7AM, please contact night-coverage [email or web address] Password [facility] 06/27/2017, 12:17 PM

## 2017-06-27 NOTE — Plan of Care (Signed)
  Problem: Clinical Measurements: Goal: Ability to maintain clinical measurements within normal limits will improve Outcome: Progressing   Problem: Activity: Goal: Risk for activity intolerance will decrease Outcome: Progressing   

## 2017-06-28 DIAGNOSIS — N390 Urinary tract infection, site not specified: Secondary | ICD-10-CM

## 2017-06-28 LAB — URINE CULTURE

## 2017-06-28 LAB — CBC
HCT: 32.4 % — ABNORMAL LOW (ref 39.0–52.0)
Hemoglobin: 10.8 g/dL — ABNORMAL LOW (ref 13.0–17.0)
MCH: 34.3 pg — ABNORMAL HIGH (ref 26.0–34.0)
MCHC: 33.3 g/dL (ref 30.0–36.0)
MCV: 102.9 fL — ABNORMAL HIGH (ref 78.0–100.0)
Platelets: 127 10*3/uL — ABNORMAL LOW (ref 150–400)
RBC: 3.15 MIL/uL — ABNORMAL LOW (ref 4.22–5.81)
RDW: 12.3 % (ref 11.5–15.5)
WBC: 3.4 10*3/uL — ABNORMAL LOW (ref 4.0–10.5)

## 2017-06-28 LAB — BASIC METABOLIC PANEL WITH GFR
BUN: 17 mg/dL (ref 8–23)
CO2: 25 mmol/L (ref 22–32)
Chloride: 108 mmol/L (ref 98–111)
Creatinine, Ser: 1.51 mg/dL — ABNORMAL HIGH (ref 0.61–1.24)
Potassium: 4.2 mmol/L (ref 3.5–5.1)
Sodium: 140 mmol/L (ref 135–145)

## 2017-06-28 LAB — BASIC METABOLIC PANEL
Anion gap: 7 (ref 5–15)
Calcium: 8.7 mg/dL — ABNORMAL LOW (ref 8.9–10.3)
GFR calc Af Amer: 50 mL/min — ABNORMAL LOW (ref >60)
GFR calc non Af Amer: 43 mL/min — ABNORMAL LOW (ref >60)
Glucose, Bld: 119 mg/dL — ABNORMAL HIGH (ref 70–99)

## 2017-06-28 LAB — MAGNESIUM: Magnesium: 2.3 mg/dL (ref 1.7–2.4)

## 2017-06-28 MED ORDER — SODIUM CHLORIDE 0.9 % IV SOLN
1.0000 g | Freq: Three times a day (TID) | INTRAVENOUS | Status: DC
  Administered 2017-06-28 – 2017-06-29 (×4): 1 g via INTRAVENOUS
  Filled 2017-06-28 (×5): qty 1

## 2017-06-28 NOTE — Plan of Care (Signed)
  Problem: Urinary Elimination: Goal: Signs and symptoms of infection will decrease Outcome: Progressing   

## 2017-06-28 NOTE — Progress Notes (Signed)
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 Due to history of ESBL, he was empirically started on IV merrem.   New events last 24 hours / Subjective: Patient without any complaints this morning.  He states that his dysuria has improved.  Denies any nausea, vomiting, abdominal pain.  He denies any flank pain today.  Assessment & Plan:   Principal Problem:   Complicated UTI (urinary tract infection) Active Problems:   Essential hypertension, benign   Acute renal failure superimposed on stage 3 chronic kidney disease (HCC)   TIA (transient ischemic attack)   Anxiety   Left-sided pyelonephritis with moderate obstruction -CT A/P revealed moderate left hydronephrosis with perinephric stranding. No evidence of nephrolithiasis on the left side however this is noted on the right (nonobstructing). With a history of having multiple stones this raises concern for possible ureteral stenosis -Continue IV meropenem started 06/26/2016 for history of ESBL E. coli -Urine culture showed contaminant  -Blood cultures negative to date   CKD stage 3 -Baseline Cr around 1.5-1.8 -Avoid nephrotoxins -Remains stable   Hypertension -Continue amlodipine 10 mg daily -Continue atenolol 50 mg daily -BP stable   Chronic pain -Continue duloxetine 30 mg daily -Continue PRN alprazolam -Continue PRN pain control meds   History of prostate cancer status post chemo radiation -Stable  History of TIA -Continue aspirin 81  mg    DVT prophylaxis: Lovenox Code Status: Full Family Communication: No family at bedside Disposition Plan: Pending improvement, hopeful discharge home 6/27   Consultants:   None  Procedures:   None   Antimicrobials:  Anti-infectives (From admission, onward)   Start     Dose/Rate Route Frequency Ordered Stop   06/28/17 1200  meropenem (MERREM) 1 g in sodium chloride 0.9 % 100 mL IVPB     1 g 200 mL/hr over 30 Minutes Intravenous Every 8 hours 06/28/17 1056     06/26/17 2330  meropenem (MERREM) 1 g in sodium chloride 0.9 % 100 mL IVPB  Status:  Discontinued     1 g 200 mL/hr over 30 Minutes Intravenous Every 12 hours 06/26/17 2323 06/28/17 1056   06/26/17 2145  cefTRIAXone (ROCEPHIN) 1 g in sodium chloride 0.9 % 100 mL IVPB     1 g 200 mL/hr over 30 Minutes Intravenous  Once 06/26/17 2137 06/26/17 2300       Objective: Vitals:   06/27/17 2111 06/28/17 0537 06/28/17 0541 06/28/17 1339  BP: 129/79 (!) 155/102 (!) 157/98 (!) 142/82  Pulse: 84 83 75 70  Resp: 17 15  16   Temp: 98.7 F (37.1 C) 98.2 F (36.8 C)  97.8 F (36.6 C)  TempSrc: Oral Oral  Oral  SpO2: 100% 100%  100%  Weight:      Height:        Intake/Output Summary (Last 24 hours) at 06/28/2017 1349 Last data filed at 06/28/2017 1338 Gross per 24 hour  Intake 1327 ml  Output 350 ml  Net 977 ml   Filed Weights   06/26/17 1850 06/27/17 0050  Weight: 98.9 kg (218  lb) 95.6 kg (210 lb 12.2 oz)    Examination:  General exam: Appears calm and comfortable  Respiratory system: Clear to auscultation. Respiratory effort normal. Cardiovascular system: S1 & S2 heard, RRR. No JVD, murmurs, rubs, gallops or clicks. No pedal edema. Gastrointestinal system: Abdomen is nondistended, soft and nontender. No organomegaly or masses felt. Normal bowel sounds heard. Central nervous system: Alert and oriented. No focal neurological deficits. Extremities: Symmetric 5 x 5 power. Skin: No rashes, lesions or  ulcers Psychiatry: Judgement and insight appear normal. Mood & affect appropriate.   Data Reviewed: I have personally reviewed following labs and imaging studies  CBC: Recent Labs  Lab 06/26/17 1919 06/27/17 0608 06/28/17 0435  WBC 7.2 4.7 3.4*  HGB 11.9* 10.4* 10.8*  HCT 36.8* 32.4* 32.4*  MCV 105.4* 106.9* 102.9*  PLT 149* 122* 315*   Basic Metabolic Panel: Recent Labs  Lab 06/26/17 1919 06/27/17 0608 06/28/17 0435  NA 140 141 140  K 4.5 3.9 4.2  CL 110 110 108  CO2 24 27 25   GLUCOSE 125* 130* 119*  BUN 19 18 17   CREATININE 2.00* 1.85* 1.51*  CALCIUM 9.2 8.6* 8.7*  MG  --   --  2.3   GFR: Estimated Creatinine Clearance: 49.9 mL/min (A) (by C-G formula based on SCr of 1.51 mg/dL (H)). Liver Function Tests: No results for input(s): AST, ALT, ALKPHOS, BILITOT, PROT, ALBUMIN in the last 168 hours. No results for input(s): LIPASE, AMYLASE in the last 168 hours. No results for input(s): AMMONIA in the last 168 hours. Coagulation Profile: No results for input(s): INR, PROTIME in the last 168 hours. Cardiac Enzymes: No results for input(s): CKTOTAL, CKMB, CKMBINDEX, TROPONINI in the last 168 hours. BNP (last 3 results) No results for input(s): PROBNP in the last 8760 hours. HbA1C: No results for input(s): HGBA1C in the last 72 hours. CBG: No results for input(s): GLUCAP in the last 168 hours. Lipid Profile: No results for input(s): CHOL, HDL, LDLCALC, TRIG, CHOLHDL, LDLDIRECT in the last 72 hours. Thyroid Function Tests: No results for input(s): TSH, T4TOTAL, FREET4, T3FREE, THYROIDAB in the last 72 hours. Anemia Panel: No results for input(s): VITAMINB12, FOLATE, FERRITIN, TIBC, IRON, RETICCTPCT in the last 72 hours. Sepsis Labs: No results for input(s): PROCALCITON, LATICACIDVEN in the last 168 hours.  Recent Results (from the past 240 hour(s))  Urine culture     Status: Abnormal   Collection Time: 06/26/17 11:26 PM  Result Value Ref Range Status   Specimen  Description URINE, CLEAN CATCH  Final   Special Requests   Final    NONE Performed at [facility], [place] [address], [place], [place] [address]    Culture MULTIPLE SPECIES PRESENT, SUGGEST RECOLLECTION (A)  Final   Report Status 06/28/2017 FINAL  Final  Culture, blood (Routine X 2) w Reflex to ID Panel     Status: None (Preliminary result)   Collection Time: 06/26/17 11:40 PM  Result Value Ref Range Status   Specimen Description BLOOD LEFT ANTECUBITAL  Final   Special Requests   Final    BOTTLES DRAWN AEROBIC AND ANAEROBIC Blood Culture adequate volume   Culture   Final    NO GROWTH 1 DAY Performed at [facility], [place] [address], [place],  [address]    Report Status PENDING  Incomplete  Culture, blood (Routine X 2) w Reflex to ID Panel     Status: None (Preliminary result)   Collection Time: 06/26/17 11:40 PM  Result  Value Ref Range Status   Specimen Description BLOOD LEFT HAND  Final   Special Requests   Final    BOTTLES DRAWN AEROBIC AND ANAEROBIC Blood Culture adequate volume   Culture   Final    NO GROWTH 1 DAY Performed at [facility], [address] [place], [place] [address]    Report Status PENDING  Incomplete       Radiology Studies: Ct Abdomen Pelvis Wo Contrast  Result Date: 06/26/2017 CLINICAL DATA:  77 y/o M; nausea, vomiting constipation, and sensation of fullness for a month and painful urination for 1 week. Hematuria. EXAM: CT ABDOMEN AND PELVIS WITHOUT CONTRAST TECHNIQUE: Multidetector CT imaging of the abdomen and pelvis was performed following the standard protocol without IV contrast. COMPARISON:  06/26/2014 CT abdomen and pelvis. FINDINGS: Lower chest: No acute abnormality. Hepatobiliary: Multiple hepatic cysts the largest at the dome measuring 22 mm are stable. No cholelithiasis, gallbladder thickening, or biliary ductal dilatation. Pancreas: Unremarkable. No pancreatic ductal dilatation or surrounding inflammatory changes. Spleen:  Normal in size without focal abnormality. Adrenals/Urinary Tract: Normal adrenal glands. Large right kidney caliceal stone measuring 10 x 16 mm. Punctate left kidney lower pole stone. No right hydronephrosis. No focal kidney lesion identified. Moderate left hydronephrosis and perinephric stranding. No obstructing stone identified. Stomach/Bowel: Stomach is within normal limits. Appendix appears normal. No evidence of bowel wall thickening, distention, or inflammatory changes. Mild sigmoid diverticulosis without findings of acute diverticulitis. Vascular/Lymphatic: Aortic atherosclerosis. No enlarged abdominal or pelvic lymph nodes. Reproductive: Bonney Roussel densities within the prostate gland, probably prostate seeds. Other: Small right inguinal hernia containing fat. Paraumbilical hernia containing fat mildly increased in size. Musculoskeletal: No fracture is seen. Transitional L5 vertebral body. IMPRESSION: 1. Moderate left hydronephrosis and perinephric stranding. No obstructing stone identified. Findings may represent a recently passed stone or infection of the renal collecting system. 2. Right kidney nonobstructing caliceal stones measuring up to 16 mm. Punctate left kidney lower pole stone. 3. Multiple stable hepatic cysts. 4. Mild increase in size of paraumbilical and stable small right inguinal hernias containing fat. Electronically Signed   By: Kristine Garbe M.D.   On: 06/26/2017 22:47      Scheduled Meds: . amLODipine  10 mg Oral Daily  . aspirin  81 mg Oral Daily  . atenolol  50 mg Oral Daily  . B-complex with vitamin C  1 tablet Oral Daily  . brimonidine  1 drop Both Eyes BID  . brinzolamide  1 drop Both Eyes BID  . DULoxetine  30 mg Oral Daily  . enoxaparin (LOVENOX) injection  40 mg Subcutaneous Q24H  . polyethylene glycol  17 g Oral Daily  . senna-docusate  2 tablet Oral BID  . timolol  1 drop Both Eyes BID   Continuous Infusions: . meropenem (MERREM) IV 1 g (06/28/17 1139)      LOS: 1 day    Time spent: 35 minutes   Dessa Phi, DO [facility] [email or web address] Password [email or web address] 06/28/2017, 1:49 PM

## 2017-06-28 NOTE — Progress Notes (Signed)
Pharmacy Antibiotic Note  [name] is a 77 y.o. male admitted on 06/26/2017 with UTI with history of ESBL.  Pharmacy has been consulted for meropenem dosing.  Renal function is improving.  Afebrile, WBC low at 3.4.  Plan: Change Merrem to 1gm IV Q8H Monitor renal fxn, clinical progress, abx LOT   Height: 6' (182.9 cm) Weight: 210 lb 12.2 oz (95.6 kg)(bed scale) IBW/kg (Calculated) : 77.6  Temp (24hrs), Avg:98.2 F (36.8 C), Min:97.7 F (36.5 C), Max:98.7 F (37.1 C)  Recent Labs  Lab 06/26/17 1919 06/27/17 0608 06/28/17 0435  WBC 7.2 4.7 3.4*  CREATININE 2.00* 1.85* 1.51*    Estimated Creatinine Clearance: 49.9 mL/min (A) (by C-G formula based on SCr of 1.51 mg/dL (H)).    Allergies  Allergen Reactions  . Tape Other (See Comments)    Redness and blisters/paper tape-not latex  . Doxycycline Hyclate Other (See Comments)    Mental changes   . Sulfa Antibiotics Hives     Merrem 6/24 >>  6/24 UCx - suggest recollection 6/24 BCx - NGTD   Laurella Tull D. Mina Marble, PharmD, BCPS, BCCCP Pager:  [phone] 06/28/2017, 10:55 AM

## 2017-06-29 DIAGNOSIS — N10 Acute tubulo-interstitial nephritis: Secondary | ICD-10-CM

## 2017-06-29 DIAGNOSIS — N12 Tubulo-interstitial nephritis, not specified as acute or chronic: Secondary | ICD-10-CM

## 2017-06-29 LAB — CBC
HEMATOCRIT: 33.7 % — AB (ref 39.0–52.0)
HEMOGLOBIN: 11 g/dL — AB (ref 13.0–17.0)
MCH: 34.1 pg — ABNORMAL HIGH (ref 26.0–34.0)
MCHC: 32.6 g/dL (ref 30.0–36.0)
MCV: 104.3 fL — AB (ref 78.0–100.0)
Platelets: 128 10*3/uL — ABNORMAL LOW (ref 150–400)
RBC: 3.23 MIL/uL — ABNORMAL LOW (ref 4.22–5.81)
RDW: 12.4 % (ref 11.5–15.5)
WBC: 2.4 10*3/uL — AB (ref 4.0–10.5)

## 2017-06-29 LAB — BASIC METABOLIC PANEL
ANION GAP: 6 (ref 5–15)
BUN: 16 mg/dL (ref 8–23)
CHLORIDE: 109 mmol/L (ref 98–111)
CO2: 27 mmol/L (ref 22–32)
Calcium: 8.8 mg/dL — ABNORMAL LOW (ref 8.9–10.3)
Creatinine, Ser: 1.58 mg/dL — ABNORMAL HIGH (ref 0.61–1.24)
GFR calc non Af Amer: 41 mL/min — ABNORMAL LOW (ref >60)
GFR, EST AFRICAN AMERICAN: 47 mL/min — AB (ref >60)
Glucose, Bld: 109 mg/dL — ABNORMAL HIGH (ref 70–99)
POTASSIUM: 4.4 mmol/L (ref 3.5–5.1)
Sodium: 142 mmol/L (ref 135–145)

## 2017-06-29 MED ORDER — ASPIRIN 81 MG PO TBEC: 81.0000 mg | DELAYED_RELEASE_TABLET | Freq: Every day | ORAL | 1 refills | Status: DC

## 2017-06-29 MED ORDER — CIPROFLOXACIN HCL 500 MG PO TABS: 500.0000 mg | ORAL_TABLET | Freq: Two times a day (BID) | ORAL | 0 refills | Status: AC

## 2017-06-29 NOTE — Discharge Summary (Signed)
Physician Discharge Summary  [name] TFT:[id number] DOB: 24-Oct-1940 DOA: 06/26/2017  PCP: Patient, No Pcp Per  Admit date: 06/26/2017 Discharge date: 06/29/2017  Admitted From: Home Disposition:  Home  Recommendations for Outpatient Follow-up:  1. Follow up with PCP in 1 week 2. 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 Due to history of ESBL, he was empirically started on IV merrem. His urine culture resulted with contamination. Due to his imaging finding consistent with pyelonephritis, he will be discharge home on cipro for 7 days.   Discharge Diagnoses:  Principal Problem:   Acute pyelonephritis Active Problems:   Essential hypertension, benign   Complicated UTI (urinary tract infection)   Acute renal failure superimposed on stage 3 chronic kidney disease (HCC)   TIA (transient ischemic attack)   Anxiety  Left-sided pyelonephritis with moderate obstruction -CT A/P revealed moderate left hydronephrosis with perinephric stranding. No evidence of nephrolithiasis on the left side however this is noted on the right (nonobstructing).With a history of having multiple stones this raises concern for possible ureteral stenosis -Continue IV meropenem started 06/26/2016 for history of ESBL E. Coli --> transition to PO cipro to treat pyelonephritis for 7 days  -Urine culture showed contaminant  -Blood  cultures negative to date   CKD stage 3 -Baseline Cr around 1.5-1.8 -Avoid nephrotoxins -Remains stable   Hypertension -Continue amlodipine 10 mg daily -Continue atenolol 50 mg daily -BP stable   Chronic pain -Continue duloxetine 30 mg daily -Continue PRN alprazolam -Continue PRN pain control meds   History of prostate cancer status post chemo radiation -Stable  History of TIA -Continue aspirin 81 mg    Discharge Instructions  Discharge Instructions    Call MD for:  difficulty breathing, headache or visual disturbances   Complete by:  As directed    Call MD for:  extreme fatigue   Complete by:  As directed    Call MD for:  hives   Complete by:  As directed    Call MD for:  persistant dizziness or light-headedness   Complete by:  As directed    Call MD for:  persistant nausea and vomiting   Complete by:  As directed    Call MD for:  severe uncontrolled pain   Complete by:  As directed    Call MD for:  temperature >100.4   Complete by:  As directed    Diet - low sodium heart healthy   Complete by:  As directed    Discharge instructions   Complete by:  As directed    You were cared for by a hospitalist during your hospital stay. If you have any questions about your discharge medications or the care you received while you were in the hospital after you are discharged, you can call the unit and ask to speak with the hospitalist on call if the hospitalist that took care of you is not available. Once you are discharged, your primary care physician will handle any further medical issues. Please note that NO  REFILLS for any discharge medications will be authorized once you are discharged, as it is imperative that you return to your primary care physician (or establish a relationship with a primary care physician if you do not have one) for your aftercare needs so that they can reassess your need for medications and monitor your lab values.   Increase activity slowly    Complete by:  As directed      Allergies as of 06/29/2017      Reactions   Tape Other (See Comments)   Redness and blisters/paper tape-not latex   Doxycycline Hyclate Other (See Comments)   Mental changes    Sulfa Antibiotics Hives      Medication List    TAKE these medications   ALPRAZolam 0.5 MG tablet Commonly known as:  XANAX Take 0.5 mg by mouth daily as needed for anxiety. Anxiety/sleep   amLODipine 10 MG tablet Commonly known as:  NORVASC Take 1 tablet (10 mg total) by mouth daily.   aspirin 81 MG EC tablet Take 1 tablet (81 mg total) by mouth daily.   atenolol 50 MG tablet Commonly known as:  TENORMIN Take 1 tablet (50 mg total) by mouth daily.   ciprofloxacin 500 MG tablet Commonly known as:  CIPRO Take 1 tablet (500 mg total) by mouth 2 (two) times daily for 7 days.   DULoxetine 30 MG capsule Commonly known as:  CYMBALTA Take 30 mg by mouth daily.   HYDROcodone-acetaminophen 10-325 MG tablet Commonly known as:  NORCO Take 1 tablet by mouth 4 (four) times daily as needed for moderate pain or severe pain.   SIMBRINZA 1-0.2 % Susp Generic drug:  Brinzolamide-Brimonidine Place 1 drop into both eyes 2 (two) times daily.   timolol 0.5 % ophthalmic solution Commonly known as:  TIMOPTIC Place 1 drop into both eyes 2 (two) times daily as needed (pressure).       Allergies  Allergen Reactions  . Tape Other (See Comments)    Redness and blisters/paper tape-not latex  . Doxycycline Hyclate Other (See Comments)    Mental changes   . Sulfa Antibiotics Hives    Consultations:  None   Procedures/Studies: Ct Abdomen Pelvis Wo Contrast  Result Date: 06/26/2017 CLINICAL DATA:  77 y/o M; nausea, vomiting constipation, and sensation of fullness for a month and painful urination for 1 week. Hematuria. EXAM: CT ABDOMEN AND PELVIS WITHOUT CONTRAST TECHNIQUE: Multidetector CT imaging of the abdomen and pelvis was performed following the standard protocol without  IV contrast. COMPARISON:  06/26/2014 CT abdomen and pelvis. FINDINGS: Lower chest: No acute abnormality. Hepatobiliary: Multiple hepatic cysts the largest at the dome measuring 22 mm are stable. No cholelithiasis, gallbladder thickening, or biliary ductal dilatation. Pancreas: Unremarkable. No pancreatic ductal dilatation or surrounding inflammatory changes. Spleen: Normal in size without focal abnormality. Adrenals/Urinary Tract: Normal adrenal glands. Large right kidney caliceal stone measuring 10 x 16 mm. Punctate left kidney lower pole stone. No right hydronephrosis. No focal kidney lesion identified. Moderate left hydronephrosis and perinephric stranding. No obstructing stone identified. Stomach/Bowel: Stomach is within normal limits. Appendix appears normal. No evidence of bowel wall thickening, distention, or inflammatory changes. Mild sigmoid diverticulosis without findings of acute diverticulitis. Vascular/Lymphatic: Aortic atherosclerosis. No enlarged abdominal or pelvic lymph nodes. Reproductive: [place] densities within the prostate gland, probably prostate seeds. Other: Small right inguinal hernia containing fat. Paraumbilical hernia containing fat mildly increased in size. Musculoskeletal: No fracture is seen. Transitional L5 vertebral body. IMPRESSION: 1. Moderate left hydronephrosis and perinephric  stranding. No obstructing stone identified. Findings may represent a recently passed stone or infection of the renal collecting system. 2. Right kidney nonobstructing caliceal stones measuring up to 16 mm. Punctate left kidney lower pole stone. 3. Multiple stable hepatic cysts. 4. Mild increase in size of paraumbilical and stable small right inguinal hernias containing fat. Electronically Signed   By: Kristine Garbe M.D.   On: 06/26/2017 22:47       Discharge Exam: Vitals:   06/28/17 2115 06/29/17 0609  BP: (!) 147/92 136/88  Pulse: 71 71  Resp: 15 16  Temp: 98.4 F (36.9 C) 98.4 F  (36.9 C)  SpO2: 100% 100%    General: Pt is alert, awake, not in acute distress Cardiovascular: RRR, S1/S2 +, no rubs, no gallops Respiratory: CTA bilaterally, no wheezing, no rhonchi Abdominal: Soft, NT, ND, bowel sounds + Extremities: no edema, no cyanosis    The results of significant diagnostics from this hospitalization (including imaging, microbiology, ancillary and laboratory) are listed below for reference.     Microbiology: Recent Results (from the past 240 hour(s))  Urine culture     Status: Abnormal   Collection Time: 06/26/17 11:26 PM  Result Value Ref Range Status   Specimen Description URINE, CLEAN CATCH  Final   Special Requests   Final    NONE Performed at [facility], [place] [address], [place], [place] [address]    Culture MULTIPLE SPECIES PRESENT, SUGGEST RECOLLECTION (A)  Final   Report Status 06/28/2017 FINAL  Final  Culture, blood (Routine X 2) w Reflex to ID Panel     Status: None (Preliminary result)   Collection Time: 06/26/17 11:40 PM  Result Value Ref Range Status   Specimen Description BLOOD LEFT ANTECUBITAL  Final   Special Requests   Final    BOTTLES DRAWN AEROBIC AND ANAEROBIC Blood Culture adequate volume   Culture   Final    NO GROWTH 2 DAYS Performed at [facility], [place] [address], [place], [place] [address]    Report Status PENDING  Incomplete  Culture, blood (Routine X 2) w Reflex to ID Panel     Status: None (Preliminary result)   Collection Time: 06/26/17 11:40 PM  Result Value Ref Range Status   Specimen Description BLOOD LEFT HAND  Final   Special Requests   Final    BOTTLES DRAWN AEROBIC AND ANAEROBIC Blood Culture adequate volume   Culture   Final    NO GROWTH 2 DAYS Performed at [facility], [place] [address], [place], [place] [address]    Report Status PENDING  Incomplete     Labs: BNP (last 3 results) No results for input(s): BNP in the last 8760 hours. Basic Metabolic Panel: Recent Labs  Lab  06/26/17 1919 06/27/17 0608 06/28/17 0435 06/29/17 0622  NA 140 141 140 142  K 4.5 3.9 4.2 4.4  CL 110 110 108 109  CO2 24 27 25 27   GLUCOSE 125* 130* 119* 109*  BUN 19 18 17 16   CREATININE 2.00* 1.85* 1.51* 1.58*  CALCIUM 9.2 8.6* 8.7* 8.8*  MG  --   --  2.3  --    Liver Function Tests: No results for input(s): AST, ALT, ALKPHOS, BILITOT, PROT, ALBUMIN in the last 168 hours. No results for input(s): LIPASE, AMYLASE in the last 168 hours. No results for input(s): AMMONIA in the last 168 hours. CBC: Recent Labs  Lab 06/26/17 1919 06/27/17 0608 06/28/17 0435 06/29/17 0622  WBC  7.2 4.7 3.4* 2.4*  HGB 11.9* 10.4* 10.8* 11.0*  HCT 36.8* 32.4* 32.4* 33.7*  MCV 105.4* 106.9* 102.9* 104.3*  PLT 149* 122* 127* 128*   Cardiac Enzymes: No results for input(s): CKTOTAL, CKMB, CKMBINDEX, TROPONINI in the last 168 hours. BNP: Invalid input(s): POCBNP CBG: No results for input(s): GLUCAP in the last 168 hours. D-Dimer No results for input(s): DDIMER in the last 72 hours. Hgb A1c No results for input(s): HGBA1C in the last 72 hours. Lipid Profile No results for input(s): CHOL, HDL, LDLCALC, TRIG, CHOLHDL, LDLDIRECT in the last 72 hours. Thyroid function studies No results for input(s): TSH, T4TOTAL, T3FREE, THYROIDAB in the last 72 hours.  Invalid input(s): FREET3 Anemia work up No results for input(s): VITAMINB12, FOLATE, FERRITIN, TIBC, IRON, RETICCTPCT in the last 72 hours. Urinalysis    Component Value Date/Time   COLORURINE YELLOW 06/26/2017 1905   APPEARANCEUR HAZY (A) 06/26/2017 1905   LABSPEC 1.015 06/26/2017 1905   PHURINE 7.0 06/26/2017 1905   GLUCOSEU NEGATIVE 06/26/2017 1905   HGBUR SMALL (A) 06/26/2017 1905   BILIRUBINUR NEGATIVE 06/26/2017 1905   KETONESUR NEGATIVE 06/26/2017 1905   PROTEINUR NEGATIVE 06/26/2017 1905   UROBILINOGEN 0.2 09/24/2014 1336   NITRITE NEGATIVE 06/26/2017 1905   LEUKOCYTESUR LARGE (A) 06/26/2017 1905   Sepsis Labs Invalid  input(s): PROCALCITONIN,  WBC,  LACTICIDVEN Microbiology Recent Results (from the past 240 hour(s))  Urine culture     Status: Abnormal   Collection Time: 06/26/17 11:26 PM  Result Value Ref Range Status   Specimen Description URINE, CLEAN CATCH  Final   Special Requests   Final    NONE Performed at [facility], [place] [address] [place], [place] [address]    Culture MULTIPLE SPECIES PRESENT, SUGGEST RECOLLECTION (A)  Final   Report Status 06/28/2017 FINAL  Final  Culture, blood (Routine X 2) w Reflex to ID Panel     Status: None (Preliminary result)   Collection Time: 06/26/17 11:40 PM  Result Value Ref Range Status   Specimen Description BLOOD LEFT ANTECUBITAL  Final   Special Requests   Final    BOTTLES DRAWN AEROBIC AND ANAEROBIC Blood Culture adequate volume   Culture   Final    NO GROWTH 2 DAYS Performed at [facility], [place] [address], [place], [place] [address]    Report Status PENDING  Incomplete  Culture, blood (Routine X 2) w Reflex to ID Panel     Status: None (Preliminary result)   Collection Time: 06/26/17 11:40 PM  Result Value Ref Range Status   Specimen Description BLOOD LEFT HAND  Final   Special Requests   Final    BOTTLES DRAWN AEROBIC AND ANAEROBIC Blood Culture adequate volume   Culture   Final    NO GROWTH 2 DAYS Performed at [facility], [place] [address], [place], [place] [address]    Report Status PENDING  Incomplete     Patient was seen and examined on the day of discharge and was found to be in stable condition. Time coordinating discharge: 25 minutes including assessment and coordination of care, as well as examination of the patient.   SIGNED:  Dessa Phi, DO [facility] Pager [phone]  If 7PM-7AM, please contact night-coverage [email or web address] Password [facility] 06/29/2017, 11:54 AM

## 2017-06-29 NOTE — Progress Notes (Signed)
Discharge home. Home discharge instruction given, no question verbalized. 

## 2017-07-01 ENCOUNTER — Other Ambulatory Visit: Payer: Self-pay | Admitting: Urgent Care

## 2017-07-02 LAB — CULTURE, BLOOD (ROUTINE X 2)
CULTURE: NO GROWTH
CULTURE: NO GROWTH
Special Requests: ADEQUATE
Special Requests: ADEQUATE

## 2017-10-20 ENCOUNTER — Other Ambulatory Visit: Payer: Self-pay | Admitting: Urgent Care

## 2017-10-23 ENCOUNTER — Other Ambulatory Visit: Payer: Self-pay | Admitting: Family Medicine

## 2017-12-20 ENCOUNTER — Other Ambulatory Visit: Payer: Self-pay | Admitting: Physician Assistant

## 2017-12-20 DIAGNOSIS — R3129 Other microscopic hematuria: Secondary | ICD-10-CM

## 2017-12-25 ENCOUNTER — Ambulatory Visit
Admission: RE | Admit: 2017-12-25 | Discharge: 2017-12-25 | Disposition: A | Discharge status: Home / Self Care | Payer: Medicare Other | Source: Ambulatory Visit | Attending: Physician Assistant | Admitting: Physician Assistant

## 2017-12-25 DIAGNOSIS — R3129 Other microscopic hematuria: Secondary | ICD-10-CM

## 2017-12-27 ENCOUNTER — Other Ambulatory Visit: Payer: Self-pay

## 2017-12-27 ENCOUNTER — Encounter (HOSPITAL_COMMUNITY): Payer: Self-pay | Admitting: *Deleted

## 2017-12-27 ENCOUNTER — Emergency Department (HOSPITAL_COMMUNITY): Payer: Medicare Other

## 2017-12-27 ENCOUNTER — Emergency Department (HOSPITAL_COMMUNITY)
Admission: EM | Admit: 2017-12-27 | Discharge: 2017-12-27 | Disposition: A | Discharge status: Home / Self Care | Payer: Medicare Other | Attending: Emergency Medicine | Admitting: Emergency Medicine

## 2017-12-27 ENCOUNTER — Telehealth: Payer: Self-pay | Admitting: *Deleted

## 2017-12-27 DIAGNOSIS — R072 Precordial pain: Secondary | ICD-10-CM | POA: Diagnosis not present

## 2017-12-27 DIAGNOSIS — R0602 Shortness of breath: Secondary | ICD-10-CM | POA: Insufficient documentation

## 2017-12-27 DIAGNOSIS — Z87891 Personal history of nicotine dependence: Secondary | ICD-10-CM | POA: Insufficient documentation

## 2017-12-27 DIAGNOSIS — Z79899 Other long term (current) drug therapy: Secondary | ICD-10-CM | POA: Insufficient documentation

## 2017-12-27 DIAGNOSIS — F419 Anxiety disorder, unspecified: Secondary | ICD-10-CM | POA: Insufficient documentation

## 2017-12-27 DIAGNOSIS — N289 Disorder of kidney and ureter, unspecified: Secondary | ICD-10-CM | POA: Insufficient documentation

## 2017-12-27 DIAGNOSIS — Z8546 Personal history of malignant neoplasm of prostate: Secondary | ICD-10-CM | POA: Insufficient documentation

## 2017-12-27 LAB — BASIC METABOLIC PANEL
Anion gap: 12 (ref 5–15)
BUN: 25 mg/dL — AB (ref 8–23)
CO2: 21 mmol/L — ABNORMAL LOW (ref 22–32)
Calcium: 9.5 mg/dL (ref 8.9–10.3)
Chloride: 107 mmol/L (ref 98–111)
Creatinine, Ser: 1.86 mg/dL — ABNORMAL HIGH (ref 0.61–1.24)
GFR calc Af Amer: 40 mL/min — ABNORMAL LOW (ref >60)
GFR calc non Af Amer: 34 mL/min — ABNORMAL LOW (ref >60)
Glucose, Bld: 138 mg/dL — ABNORMAL HIGH (ref 70–99)
Potassium: 4.2 mmol/L (ref 3.5–5.1)
Sodium: 140 mmol/L (ref 135–145)

## 2017-12-27 LAB — CBC
HEMATOCRIT: 37.8 % — AB (ref 39.0–52.0)
Hemoglobin: 12.5 g/dL — ABNORMAL LOW (ref 13.0–17.0)
MCH: 33.8 pg (ref 26.0–34.0)
MCHC: 33.1 g/dL (ref 30.0–36.0)
MCV: 102.2 fL — ABNORMAL HIGH (ref 80.0–100.0)
Platelets: 101 10*3/uL — ABNORMAL LOW (ref 150–400)
RBC: 3.7 MIL/uL — ABNORMAL LOW (ref 4.22–5.81)
RDW: 12.5 % (ref 11.5–15.5)
WBC: 2.8 10*3/uL — ABNORMAL LOW (ref 4.0–10.5)
nRBC: 0 % (ref 0.0–0.2)

## 2017-12-27 LAB — I-STAT TROPONIN, ED: Troponin i, poc: 0.01 ng/mL (ref 0.00–0.08)

## 2017-12-27 LAB — D-DIMER, QUANTITATIVE (NOT AT ARMC): D DIMER QUANT: 0.67 ug{FEU}/mL — AB (ref 0.00–0.50)

## 2017-12-27 MED ORDER — ALPRAZOLAM 0.25 MG PO TABS
0.2500 mg | ORAL_TABLET | Freq: Once | ORAL | Status: AC
  Administered 2017-12-27: 0.25 mg via ORAL
  Filled 2017-12-27: qty 1

## 2017-12-27 MED ORDER — ALPRAZOLAM 0.25 MG PO TABS: 0.2500 mg | ORAL_TABLET | Freq: Every evening | ORAL | 0 refills | Status: DC | PRN

## 2017-12-27 NOTE — Discharge Instructions (Signed)
Please follow up with your PCP in 1 week.

## 2017-12-27 NOTE — Telephone Encounter (Signed)
Pt called regarding his Rx was faxed to a pharmacy that is closed on Christmas Day.  EDCM spoke with EDP who will confirm Rx with pharmacy when they call in.  EDCM advised pt to have pharmacy call EDP.

## 2017-12-27 NOTE — ED Provider Notes (Signed)
I assumed care of this patient from Dr. Christy Gentles who has been evaluating patient for chest pain.  Appears to be likely panic attacks which patient has a history of.  Used to be on Xanax in the past.  Feeling improved after receiving Xanax while in the ED.  EKG unremarkable.  Troponin within normal limits.  D-dimer is pending at this time.  Patient has no signs of pneumonia, pneumothorax on chest x-ray.  Will reevaluate and follow-up d-dimer.  Will order CT scan if needed. No concern for ACS.  Age-adjusted d-dimer is within normal limits.  Patient without any issues upon my evaluation.  Given discharge instructions.  Told to return to ED if symptoms worsen.  This chart was dictated using voice recognition software.  Despite best efforts to proofread,  errors can occur which can change the documentation meaning.      Lennice Sites, DO 12/27/17 [phone]

## 2017-12-27 NOTE — ED Provider Notes (Signed)
[facility] EMERGENCY DEPARTMENT Provider Note   CSN: [id number] Arrival date & time: 12/27/17  1443     History   Chief Complaint Chief Complaint  Patient presents with  . Chest Pain    HPI [name] is a 77 y.o. male.  The history is provided by the patient.  Chest Pain   This is a recurrent problem. The problem has been resolved. Associated with: Sleeping. The pain is present in the substernal region. The pain is moderate. The pain does not radiate. Associated symptoms include shortness of breath. Pertinent negatives include no fever. He has tried nothing for the symptoms. Risk factors include being elderly.    Patient with history of anxiety, chronic back pain, hypertension presents with chest pain/shortness of breath.  He reports that he has these episodes about every night for several months, but tonight's episode of shortness of breath seems worse.  Last episode of chest pain was approximately 7 PM.  While sleeping he began feeling more short of breath.  No fevers, no cough, no hemoptysis. reports recent diagnosis of UTI by PCP, is on antibiotics.  No significant abdominal pain this time. Past Medical History:  Diagnosis Date  . Anxiety   . Arthritis   . Bilateral flank pain    DUE TO KIDNEY STONES  . Chronic back pain   . Dyslipidemia   . Eczema   . Elevated PSA 01/11/2012   25.77  . Frequency of urination   . Glaucoma PT STATES RX RAN OUT  . Glaucoma   . Hematuria   . Herniated disc   . History of kidney stones   . History of radiation therapy 06/07/12-08/02/12   prostate, 7800 cGy/40 sessions/ 5600cGy pelvic lymph nodes/40 sessions  . History of shingles   . History of TIA (transient ischemic attack) 2010--  NO RESIDUAL   NONE SINCE  . Hypertension   . Nocturia   . Prostate cancer ([place]) 03/2012  . Renal calculi BILATERAL   . Urgency of urination   . Weakness of both legs     Patient Active Problem List   Diagnosis Date Noted  .  Acute pyelonephritis 06/29/2017  . Complicated UTI (urinary tract infection) 06/26/2017  . Acute renal failure superimposed on stage 3 chronic kidney disease ([place]) 06/26/2017  . TIA (transient ischemic attack) 06/26/2017  . Anxiety 06/26/2017  . Prostate cancer ([place]) 05/01/2012  . Other and unspecified hyperlipidemia 04/12/2012  . Chest pain, unspecified 04/12/2012  . 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 Essential hypertension, benign 04/11/2012    Past Surgical History:  Procedure Laterality Date  . BILIARY STENT PLACEMENT  06/15/2011   Procedure: BILIARY STENT PLACEMENT;  Surgeon: Franchot Gallo, MD;  Location: [facility];  Service: Urology;  Laterality: Bilateral;  . CYSTO/ BILATERAL RETROGRADE PYELOGRAM/ LEFT URETERSCOPIC STONE EXTRACTION  11-14-2005   LEFT URETERAL CALCULUS  . CYSTO/ BILATERAL URETERAL STENTS  11-06-2005  . CYSTO/ LEFT RETROGRADE PYELOGRAM/  LEFT STENT PLACMENT  05-21-2011  . CYSTOSCOPY W/ URETERAL STENT REMOVAL  06/15/2011   Procedure: CYSTOSCOPY WITH STENT REMOVAL;  Surgeon: Franchot Gallo, MD;  Location: [facility];  Service: Urology;  Laterality: Left;  . EXTRACORPOREAL SHOCK WAVE LITHOTRIPSY    . INTERNAL URETHROTOMY/ TURP  12-23-2002   BPH W/ STRICTURE  . PERCUTANEOUS NEPHROLITHOTOMY  12-10-2007  &  11-07-2005   RIGHT RENAL CALCULI  . REPAIR LEFT INGUINAL HERNIA W/ MESH  12-23-2002  . RIGHT URETERAL STENT PLACEMENT  02-10-2003  . RIGHT URETEROSCOPIC STONE EXTRACTION  05-29-2003  . TRANSTHORACIC ECHOCARDIOGRAM  12-19-2008   LVSF NORMAL/ EF 10-62%/ GRADE I DIASTOLIC DYSFUNCTION/   . TRANSURETHRAL RESECTION OF BLADDER  2004  . URETEROSCOPY  06/15/2011   Procedure: URETEROSCOPY;  Surgeon: Franchot Gallo, MD;  Location: [facility];  Service: Urology;  Laterality: Bilateral;        Home Medications    Prior to Admission medications   Medication Sig Start Date End Date Taking? Authorizing Provider    ALPRAZolam Duanne Moron) 0.5 MG tablet Take 0.5 mg by mouth daily as needed for anxiety. Anxiety/sleep   Yes [provider]  amLODipine (NORVASC) 10 MG tablet Take 1 tablet (10 mg total) by mouth daily. 03/16/17  Yes Jaynee Eagles, PA-C  atenolol (TENORMIN) 50 MG tablet Take 1 tablet (50 mg total) by mouth daily. Pt needs appointment for further refills 10/22/17  Yes Stallings, Zoe A, MD  DULoxetine (CYMBALTA) 30 MG capsule Take 30 mg by mouth daily.   Yes [provider]  HYDROcodone-acetaminophen (NORCO) 10-325 MG per tablet Take 1 tablet by mouth 4 (four) times daily as needed for moderate pain or severe pain.  02/12/14  Yes [provider]  [place] 1-0.2 % SUSP Place 1 drop into both eyes 3 (three) times a week.  02/19/17  Yes [provider]  timolol (TIMOPTIC) 0.5 % ophthalmic solution Place 1 drop into both eyes 2 (two) times daily as needed (pressure).  01/11/17  Yes [provider]  aspirin 81 MG EC tablet Take 1 tablet (81 mg total) by mouth daily. Patient not taking: Reported on 12/27/2017 06/29/17   Dessa Phi, DO    Family History Family History  Problem Relation Age of Onset  . Hypertension Mother   . Stroke Father   . Hypertension Sister   . Hypertension Brother     Social History Social History   Tobacco Use  . Smoking status: Former Smoker    Years: 40.00    Types: Cigarettes    Last attempt to quit: 06/10/1990    Years since quitting: 27.5  . Smokeless tobacco: Never Used  Substance Use Topics  . Alcohol use: No  . Drug use: No     Allergies   Tape; Doxycycline hyclate; and Sulfa antibiotics   Review of Systems Review of Systems  Constitutional: Negative for fever.  Respiratory: Positive for shortness of breath.   Cardiovascular: Positive for chest pain.  Psychiatric/Behavioral: The patient is nervous/anxious.   All other systems reviewed and are negative.    Physical Exam Updated Vital Signs BP (!) 152/91    Pulse 63   Temp 97.7 F (36.5 C) (Oral)   Resp 14   Ht 1.829 m (6')   Wt 97.5 kg   SpO2 99%   BMI 29.16 kg/m   Physical Exam CONSTITUTIONAL: Elderly, anxious HEAD: Normocephalic/atraumatic EYES: EOMI ENMT: Mucous membranes moist NECK: supple no meningeal signs SPINE/BACK:entire spine nontender CV: S1/S2 noted, no murmurs/rubs/gallops noted LUNGS: Lungs are clear to auscultation bilaterally, no apparent distress ABDOMEN: soft, nontender, no rebound or guarding, bowel sounds noted throughout abdomen GU:no cva tenderness NEURO: Pt is awake/alert/appropriate, moves all extremitiesx4.  No facial droop.   EXTREMITIES: pulses normal/equal, full ROM SKIN: warm, color normal PSYCH: Anxious  ED Treatments / Results  Labs (all labs ordered are listed, but only abnormal results are displayed) Labs Reviewed  BASIC METABOLIC PANEL - Abnormal; Notable for the following components:      Result  Value   CO2 21 (*)    Glucose, Bld 138 (*)    BUN 25 (*)    Creatinine, Ser 1.86 (*)    GFR calc non Af Amer 34 (*)    GFR calc Af Amer 40 (*)    All other components within normal limits  CBC - Abnormal; Notable for the following components:   WBC 2.8 (*)    RBC 3.70 (*)    Hemoglobin 12.5 (*)    HCT 37.8 (*)    MCV 102.2 (*)    Platelets 101 (*)    All other components within normal limits  D-DIMER, QUANTITATIVE (NOT AT [facility])  I-STAT TROPONIN, ED    EKG EKG Interpretation  Date/Time:  Wednesday December 27 2017 03:44:10 EST Ventricular Rate:  70 PR Interval:    QRS Duration: 84 QT Interval:  399 QTC Calculation: 431 R Axis:   25 Text Interpretation:  Sinus rhythm Consider left atrial enlargement Abnormal R-wave progression, early transition Interpretation limited secondary to artifact Abnormal ekg Confirmed by Ripley Fraise [phone]) on 12/27/2017 3:53:22 AM   Radiology Ct Abdomen Pelvis Wo Contrast  Result Date: 12/25/2017 CLINICAL DATA:  77 year old male with  microhematuria for 6 months with left flank pain. Prior hernia repair. Prostate cancer 2012. Kidney stones previously treated with stents. Subsequent encounter. EXAM: CT ABDOMEN AND PELVIS WITHOUT CONTRAST TECHNIQUE: Multidetector CT imaging of the abdomen and pelvis was performed following the standard protocol without IV contrast. COMPARISON:  06/26/2017 and 06/26/2014 CT. FINDINGS: Lower chest: Scarring lung bases. Heart size within normal limits. Thoracic aortic calcification. Hepatobiliary: Multiple liver cysts similar to prior exam. Taking into account limitation by non contrast imaging, no worrisome hepatic lesion. Partially contracted gallbladder without calcified stone or CT findings to suggest gallbladder inflammation. Pancreas: Taking into account limitation by non contrast imaging, no worrisome pancreatic mass or inflammation. Spleen: Taking into account limitation by non contrast imaging, no splenic mass or enlargement. Adrenals/Urinary Tract: Right renal pelvis 16 x 10 mm and 8 x 7 mm stone with very mild right-sided hydronephrosis similar to prior exam. Interval clearing of previously noted left-sided hydroureteronephrosis. Atrophic right kidney. Taking into account limitation by non contrast imaging, no worrisome renal or adrenal lesion. Noncontrast filled views of the urinary bladder without acute abnormality. Stomach/Bowel: Scattered diverticula without evidence of diverticulitis. Portions of the stomach, small bowel and colon are under distended limiting evaluation for detection of a mass. No gross abnormality noted. Vascular/Lymphatic: Atherosclerotic changes aorta and aortic branch vessels. No abdominal aortic aneurysm. Scattered normal size lymph nodes. Reproductive: Post prostatectomy.  Large slightly complex hydrocele. Other: No free air. Supraumbilical 6 cm fat and vessel containing hernia. Right inguinal canal fat and vessel containing hernia. No bowel containing hernia. Musculoskeletal:  Mild degenerative changes lumbar spine. No destructive lesion or worrisome sclerotic lesion. IMPRESSION: 1. Large slightly complex hydrocele. Testicular sonogram can be obtained for further delineation. 2. Interval clearing of previously noted left-sided hydroureteronephrosis. No obstructing left renal or ureteral calculi. 3. Right renal pelvis 16 x 10 mm and 8 x 7 mm stone with very mild right-sided hydronephrosis similar to prior exam. Atrophic right kidney. 4. Scattered colonic diverticula without evidence of diverticulitis. 5. Supraumbilical 6 cm fat and vessel containing hernia. Right inguinal canal fat and vessel containing hernia. 6. Multiple liver cysts similar to prior exam. 7. Post prostatectomy. 8.  Aortic Atherosclerosis (ICD10-I70.0). Electronically Signed   By: Genia Del M.D.   On: 12/25/2017 16:33   Dg Chest 2 View  Result Date: 12/27/2017 CLINICAL DATA:  Acute onset of central chest pain and shortness of breath. EXAM: CHEST - 2 VIEW COMPARISON:  Chest radiograph performed 03/15/2017 FINDINGS: The lungs are well-aerated. Minimal left basilar atelectasis is noted. There is no evidence of pleural effusion or pneumothorax. The heart is normal in size; the mediastinal contour is within normal limits. No acute osseous abnormalities are seen. IMPRESSION: Minimal left basilar atelectasis noted. Lungs otherwise clear. Electronically Signed   By: Garald Balding M.D.   On: 12/27/2017 04:54    Procedures Procedures  Medications Ordered in ED Medications  ALPRAZolam Duanne Moron) tablet 0.25 mg (0.25 mg Oral Given 12/27/17 0715)     Initial Impression / Assessment and Plan / ED Course  I have reviewed the triage vital signs and the nursing notes.  Pertinent labs & imaging results that were available during my care of the patient were reviewed by me and considered in my medical decision making (see chart for details).     7:02 AM Patient tells me he has these episodes most days of the week  for several months.  He reports at times he feels that he can not get a breath and feels anxious at night.  He is concerned he may be having panic attacks.  Due to his history, and his frequent episodes, it is very possible this is all anxiety related.  He reports it seemed to get worse when he stopped taking Xanax.  Narcotic database reveals that he has not had benzodiazepines since the spring We will give a dose of Xanax here He did have a brief drop in his oxygen saturation upon ambulation, the patient tells me he felt well. Will obtain d-dimer to rule out PE.  Low suspicion for dissection or ACS at this time.  8:04 AM D-dimer pending.  Patient is improved after Xanax.  I have sent Xanax to his pharmacy.  Plan to Dr. Ronnald Nian is to follow-up with d-dimer, if positive will need CT scan.  If negative he can be safely discharged home after he feels comfortable from taking the Xanax Final Clinical Impressions(s) / ED Diagnoses   Final diagnoses:  Precordial pain  Shortness of breath  Renal insufficiency  Anxiety    ED Discharge Orders    None       Ripley Fraise, MD 12/27/17 [phone]

## 2017-12-27 NOTE — ED Notes (Signed)
Pt O2 98% on RA while laying in bed. When ambulating pt stated he was becoming increasing SHOB. Did not show obvious signs of distress. Pt's O2 dropped to 82% initially while ambulating and then came back up to 96% after 10-15 seconds.

## 2017-12-27 NOTE — ED Triage Notes (Addendum)
Pt c/o sob for "some time" and central, nonradiating cheat pain. Reports abd feels bigger than normal. Denies chest pain at present. 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

## 2018-01-31 ENCOUNTER — Encounter: Payer: Self-pay | Admitting: Gastroenterology

## 2018-02-20 ENCOUNTER — Ambulatory Visit: Payer: Medicare Other | Admitting: Gastroenterology

## 2019-01-29 ENCOUNTER — Ambulatory Visit: Payer: Medicare PPO | Attending: Internal Medicine

## 2019-01-29 ENCOUNTER — Ambulatory Visit: Payer: Medicare PPO | Attending: Family

## 2019-01-29 ENCOUNTER — Other Ambulatory Visit: Payer: Self-pay

## 2019-01-29 DIAGNOSIS — Z20822 Contact with and (suspected) exposure to covid-19: Secondary | ICD-10-CM

## 2019-01-30 LAB — NOVEL CORONAVIRUS, NAA: SARS-CoV-2, NAA: NOT DETECTED

## 2019-01-31 ENCOUNTER — Telehealth: Payer: Self-pay | Admitting: General Practice

## 2019-01-31 NOTE — Telephone Encounter (Signed)
Patient called in and received his negative covid test result  °

## 2019-03-07 ENCOUNTER — Ambulatory Visit: Payer: Medicare PPO | Attending: Internal Medicine

## 2019-03-07 DIAGNOSIS — Z23 Encounter for immunization: Secondary | ICD-10-CM | POA: Insufficient documentation

## 2019-03-07 NOTE — Progress Notes (Signed)
   Covid-19 Vaccination Clinic  Name:  [name]    MRN: [id number] DOB: June 27, 1940  03/07/2019  [name] was observed post Covid-19 immunization for 15 minutes without incident. He was provided with Vaccine Information Sheet and instruction to access the V-Safe system.   [name] was instructed to call 911 with any severe reactions post vaccine: Marland Kitchen Difficulty breathing  . Swelling of face and throat  . A fast heartbeat  . A bad rash all over body  . Dizziness and weakness   Immunizations Administered    Name Date Dose VIS Date Route   Pfizer COVID-19 Vaccine 03/07/2019  8:29 AM 0.3 mL 12/14/2018 Intramuscular   Manufacturer: [place]   Lot: [id number]   [place]: [id number]

## 2019-04-02 ENCOUNTER — Ambulatory Visit: Payer: Medicare PPO | Attending: Internal Medicine

## 2019-04-02 DIAGNOSIS — Z23 Encounter for immunization: Secondary | ICD-10-CM

## 2019-04-02 NOTE — Progress Notes (Signed)
   Covid-19 Vaccination Clinic  Name:  HAYDEN GERA    MRN: [id number] DOB: 05-21-1940  04/02/2019  Mr. Iturralde was observed post Covid-19 immunization for 15 minutes without incident. He was provided with Vaccine Information Sheet and instruction to access the V-Safe system.   Mr. Cavey was instructed to call 911 with any severe reactions post vaccine: Marland Kitchen Difficulty breathing  . Swelling of face and throat  . A fast heartbeat  . A bad rash all over body  . Dizziness and weakness   Immunizations Administered    Name Date Dose VIS Date Route   Pfizer COVID-19 Vaccine 04/02/2019  1:23 PM 0.3 mL 12/14/2018 Intramuscular   Manufacturer: [place]   Lot: [id number]   [place]: [id number]

## 2019-06-24 IMAGING — DX DG CHEST 2V
2 series · 2 of 2 positions shown · non-contrast
Comparison: 06/26/2014

CLINICAL DATA: Chest pain

EXAM:
CHEST - 2 VIEW

[chest pa]
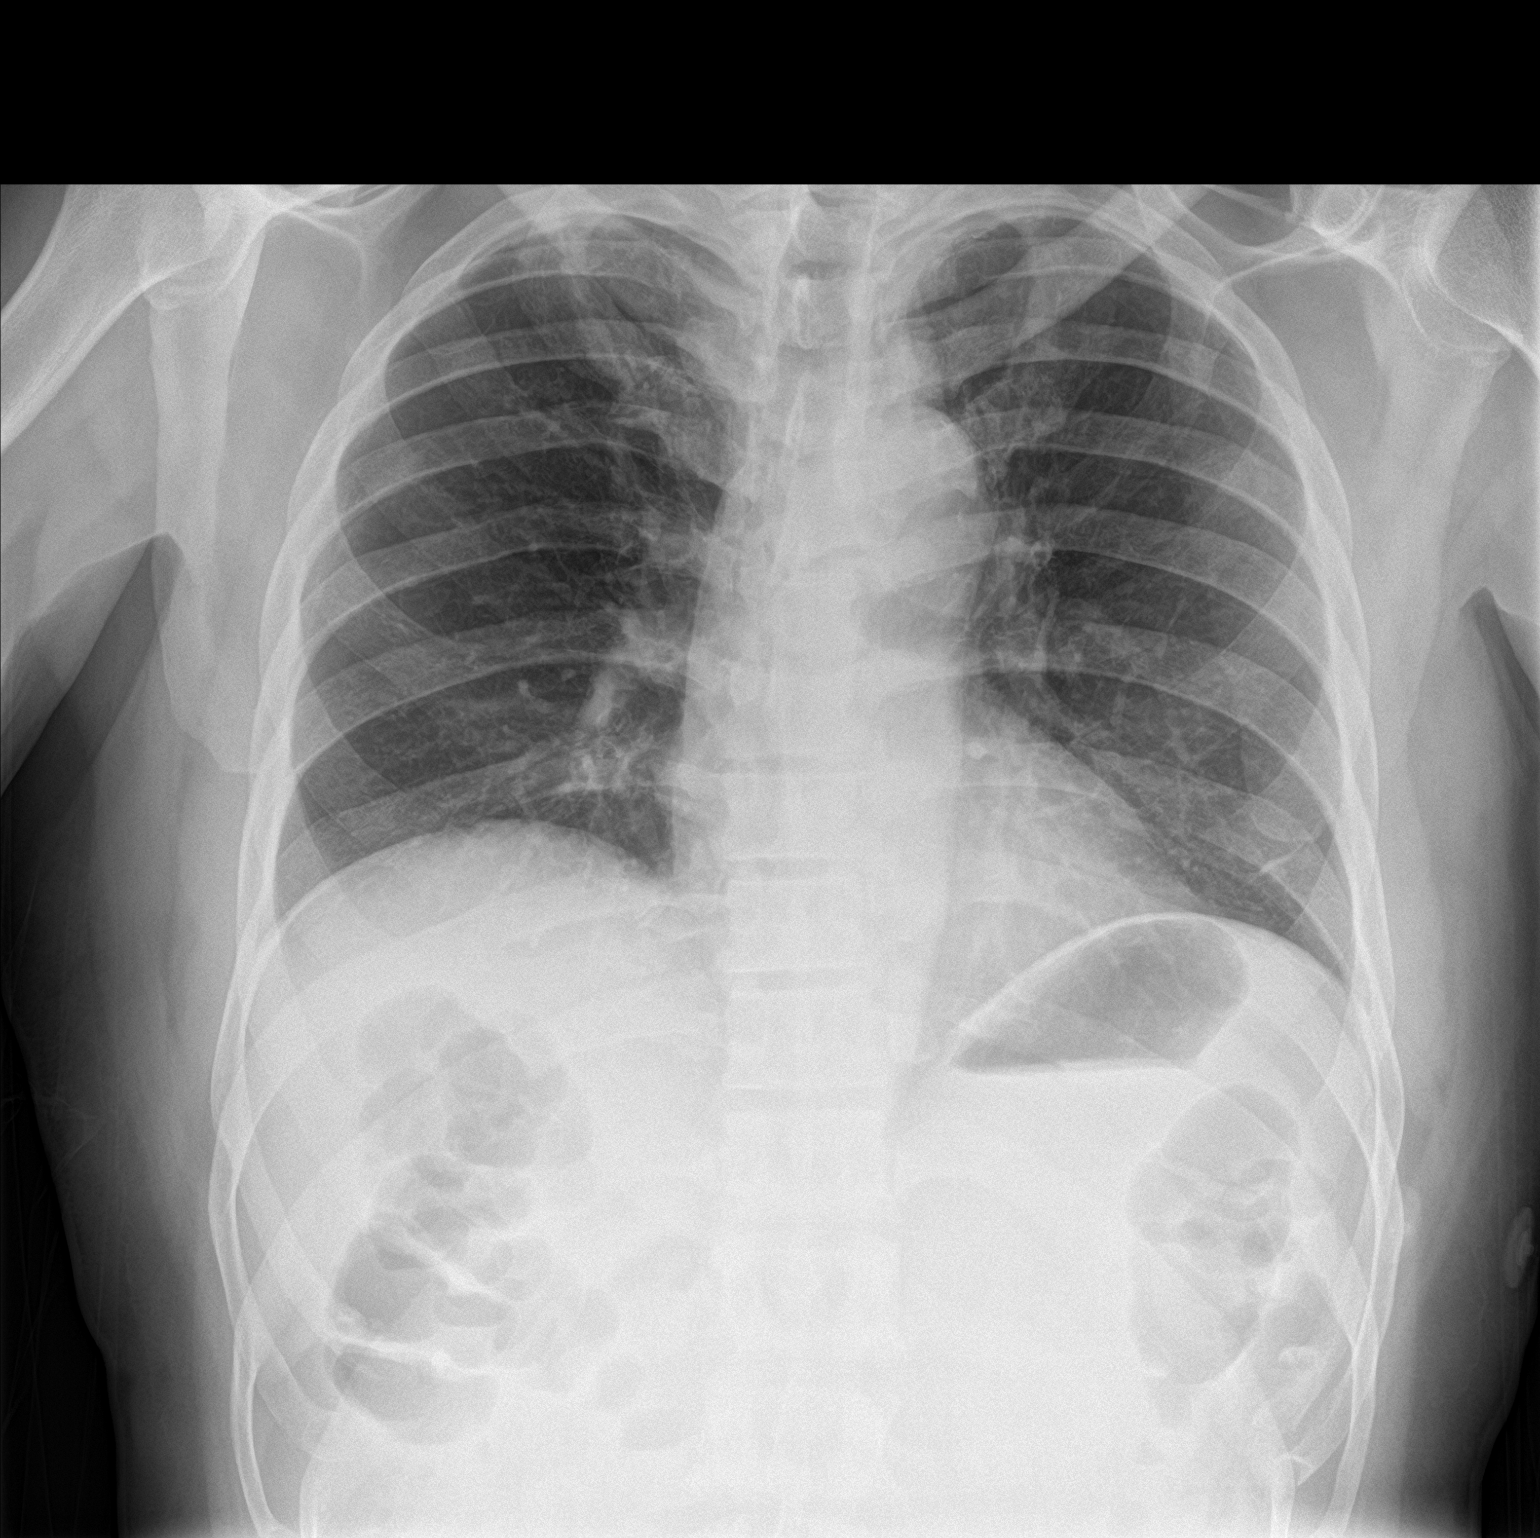

[chest lat]
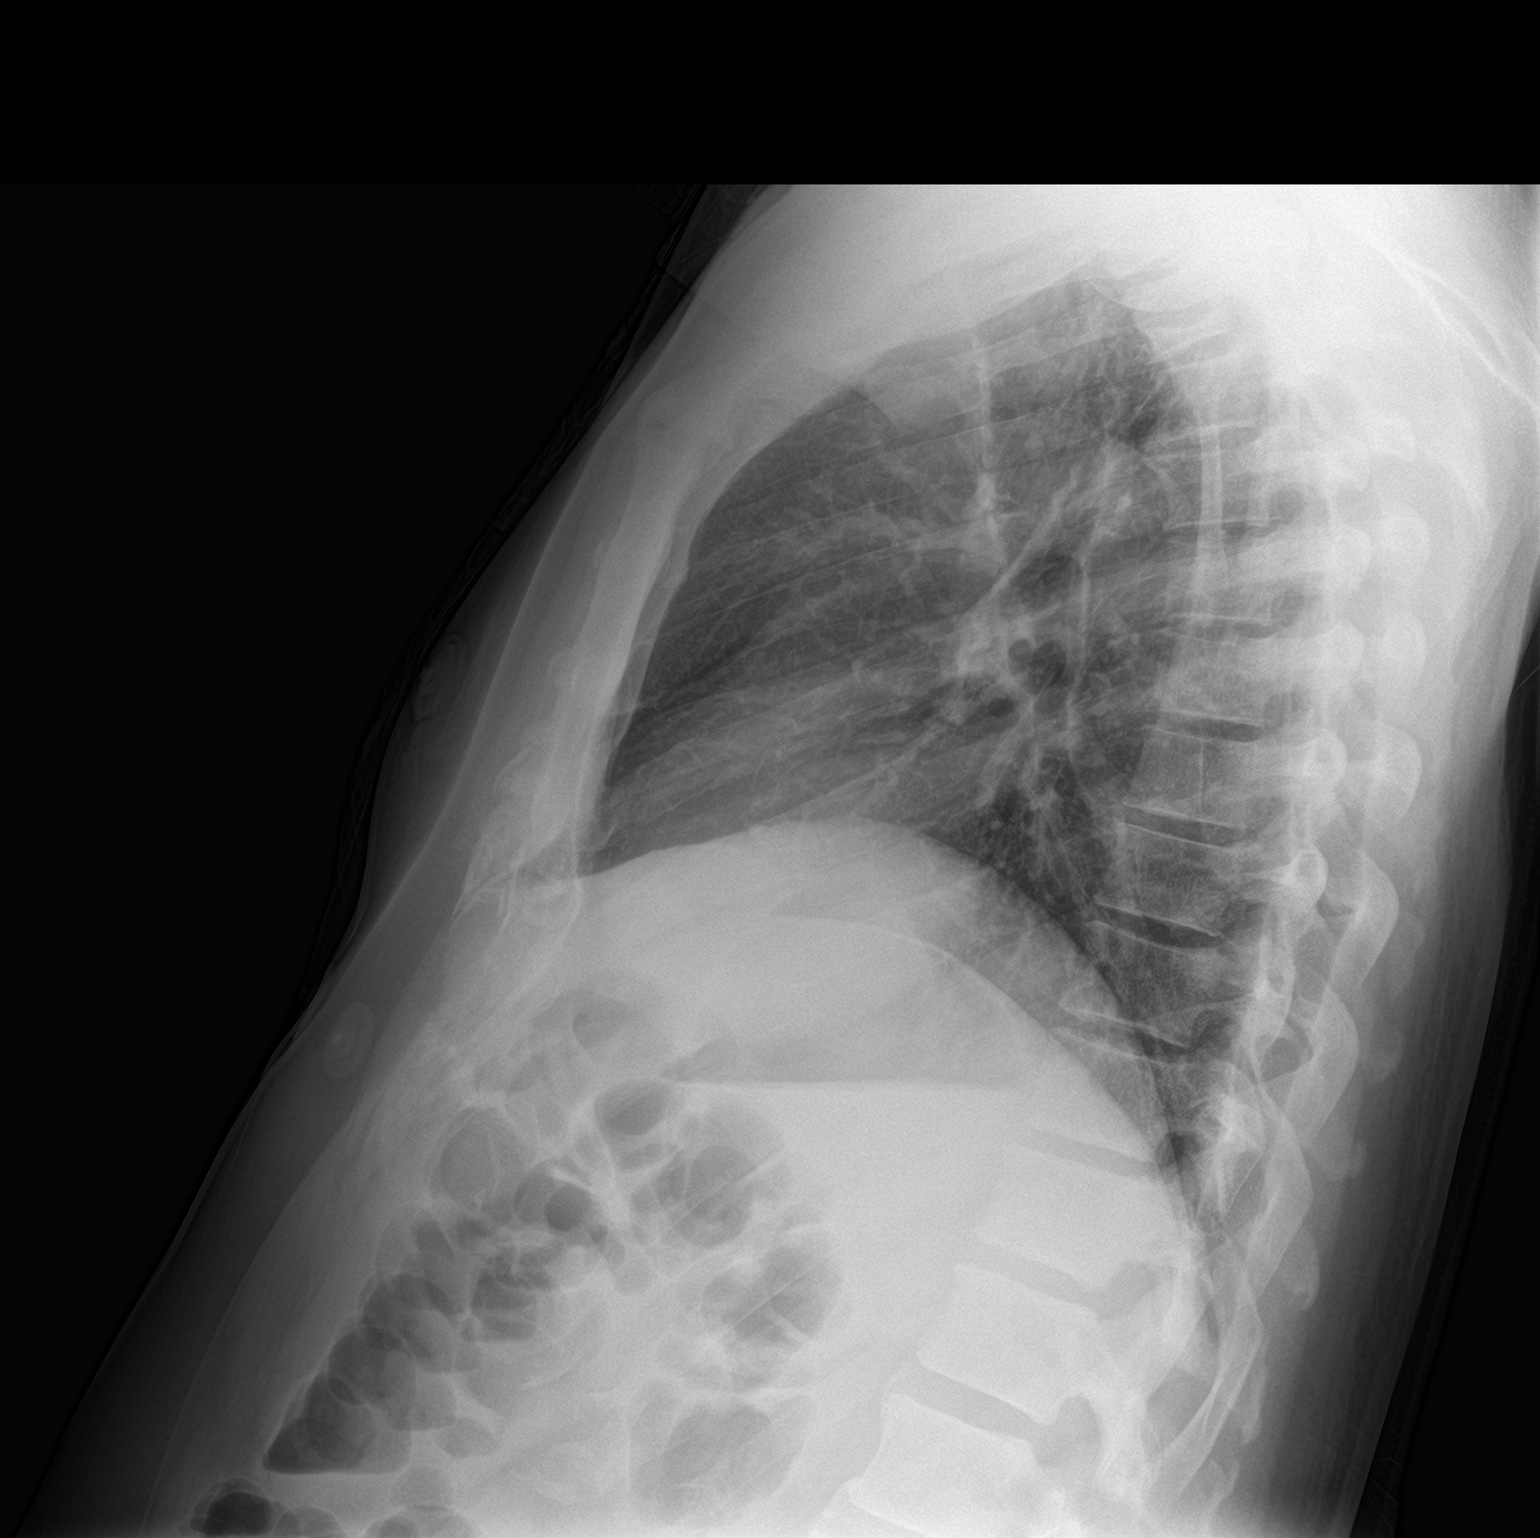

[2 of 2 positions shown; findings below may reference images not displayed]

FINDINGS: Heart and mediastinal contours are within normal limits. No focal
opacities or effusions. No acute bony abnormality.
IMPRESSION: No active cardiopulmonary disease.

## 2019-10-19 ENCOUNTER — Emergency Department (HOSPITAL_COMMUNITY): Payer: Medicare PPO

## 2019-10-19 ENCOUNTER — Emergency Department (HOSPITAL_COMMUNITY)
Admission: EM | Admit: 2019-10-19 | Discharge: 2019-10-19 | Disposition: A | Discharge status: Home / Self Care | Payer: Medicare PPO | Attending: Emergency Medicine | Admitting: Emergency Medicine

## 2019-10-19 ENCOUNTER — Encounter (HOSPITAL_COMMUNITY): Payer: Self-pay | Admitting: Emergency Medicine

## 2019-10-19 DIAGNOSIS — N183 Chronic kidney disease, stage 3 unspecified: Secondary | ICD-10-CM | POA: Insufficient documentation

## 2019-10-19 DIAGNOSIS — Z7982 Long term (current) use of aspirin: Secondary | ICD-10-CM | POA: Diagnosis not present

## 2019-10-19 DIAGNOSIS — N390 Urinary tract infection, site not specified: Secondary | ICD-10-CM | POA: Diagnosis not present

## 2019-10-19 DIAGNOSIS — I129 Hypertensive chronic kidney disease with stage 1 through stage 4 chronic kidney disease, or unspecified chronic kidney disease: Secondary | ICD-10-CM | POA: Insufficient documentation

## 2019-10-19 DIAGNOSIS — Z79899 Other long term (current) drug therapy: Secondary | ICD-10-CM | POA: Diagnosis not present

## 2019-10-19 DIAGNOSIS — Z87891 Personal history of nicotine dependence: Secondary | ICD-10-CM | POA: Diagnosis not present

## 2019-10-19 DIAGNOSIS — Z8546 Personal history of malignant neoplasm of prostate: Secondary | ICD-10-CM | POA: Insufficient documentation

## 2019-10-19 DIAGNOSIS — N200 Calculus of kidney: Secondary | ICD-10-CM | POA: Diagnosis not present

## 2019-10-19 DIAGNOSIS — R339 Retention of urine, unspecified: Secondary | ICD-10-CM | POA: Diagnosis present

## 2019-10-19 LAB — CBC WITH DIFFERENTIAL/PLATELET
Abs Immature Granulocytes: 0.17 10*3/uL — ABNORMAL HIGH (ref 0.00–0.07)
Basophils Absolute: 0 10*3/uL (ref 0.0–0.1)
Basophils Relative: 0 %
Eosinophils Absolute: 0 10*3/uL (ref 0.0–0.5)
Eosinophils Relative: 0 %
HCT: 35.7 % — ABNORMAL LOW (ref 39.0–52.0)
Hemoglobin: 11.4 g/dL — ABNORMAL LOW (ref 13.0–17.0)
Immature Granulocytes: 2 %
Lymphocytes Relative: 5 %
Lymphs Abs: 0.5 10*3/uL — ABNORMAL LOW (ref 0.7–4.0)
MCH: 33.3 pg (ref 26.0–34.0)
MCHC: 31.9 g/dL (ref 30.0–36.0)
MCV: 104.4 fL — ABNORMAL HIGH (ref 80.0–100.0)
Monocytes Absolute: 0.7 10*3/uL (ref 0.1–1.0)
Monocytes Relative: 7 %
Neutro Abs: 8.6 10*3/uL — ABNORMAL HIGH (ref 1.7–7.7)
Neutrophils Relative %: 86 %
Platelets: 137 10*3/uL — ABNORMAL LOW (ref 150–400)
RBC: 3.42 MIL/uL — ABNORMAL LOW (ref 4.22–5.81)
RDW: 12.9 % (ref 11.5–15.5)
WBC: 10 10*3/uL (ref 4.0–10.5)
nRBC: 0 % (ref 0.0–0.2)

## 2019-10-19 LAB — URINALYSIS, ROUTINE W REFLEX MICROSCOPIC
Bilirubin Urine: NEGATIVE
Glucose, UA: NEGATIVE mg/dL
Ketones, ur: NEGATIVE mg/dL
Nitrite: POSITIVE — AB
Protein, ur: 100 mg/dL — AB
Specific Gravity, Urine: 1.017 (ref 1.005–1.030)
pH: 5 (ref 5.0–8.0)

## 2019-10-19 LAB — BASIC METABOLIC PANEL
Anion gap: 11 (ref 5–15)
BUN: 15 mg/dL (ref 8–23)
CO2: 24 mmol/L (ref 22–32)
Calcium: 9.3 mg/dL (ref 8.9–10.3)
Chloride: 102 mmol/L (ref 98–111)
Creatinine, Ser: 1.89 mg/dL — ABNORMAL HIGH (ref 0.61–1.24)
GFR, Estimated: 33 mL/min — ABNORMAL LOW (ref >60)
Glucose, Bld: 157 mg/dL — ABNORMAL HIGH (ref 70–99)
Potassium: 3.6 mmol/L (ref 3.5–5.1)
Sodium: 137 mmol/L (ref 135–145)

## 2019-10-19 MED ORDER — ATENOLOL 50 MG PO TABS
50.0000 mg | ORAL_TABLET | Freq: Every day | ORAL | Status: DC
  Administered 2019-10-19: 50 mg via ORAL
  Filled 2019-10-19: qty 1

## 2019-10-19 MED ORDER — SODIUM CHLORIDE 0.9 % IV SOLN: 1.0000 g | Freq: Once | INTRAVENOUS | Status: DC

## 2019-10-19 MED ORDER — FENTANYL CITRATE (PF) 100 MCG/2ML IJ SOLN: 50.0000 ug | Freq: Once | INTRAMUSCULAR | Status: DC

## 2019-10-19 MED ORDER — AMLODIPINE BESYLATE 5 MG PO TABS
10.0000 mg | ORAL_TABLET | Freq: Every day | ORAL | Status: DC
  Administered 2019-10-19: 10 mg via ORAL
  Filled 2019-10-19: qty 2

## 2019-10-19 MED ORDER — NITROFURANTOIN MONOHYD MACRO 100 MG PO CAPS: 100.0000 mg | ORAL_CAPSULE | Freq: Two times a day (BID) | ORAL | 0 refills | Status: DC

## 2019-10-19 NOTE — ED Provider Notes (Signed)
[facility] EMERGENCY DEPARTMENT Provider Note   CSN: [id number] Arrival date & time: 10/19/19  0654     History Chief Complaint  Patient presents with  . Urinary Retention    [name] is a 79 y.o. male with past medical history of kidney stones 2019, CKD 3, prostate cancer status post surgery and radiation, who presents to the ED for frequency and dysuria.  Patient is alert and oriented during examination.  Patient states that he has been having the urge to urinate but very little urine came out each time.  He also reports burning sensation with urination.  Patient is wearing a diaper and states that he has little control over his urination.  Patient states that his diaper has been wet after each night, which is normal.  He reports an episode of fever with chill in the last 2-day.  Patient is also complaining of constipation that he thinks is also affecting his urination pattern.  Patient is not seeing a nephrologist.  He last saw his urologist a few years ago and states that his prostate cancer was " taken care of" after surgeries.  HPI     Past Medical History:  Diagnosis Date  . Anxiety   . Arthritis   . Bilateral flank pain    DUE TO KIDNEY STONES  . Chronic back pain   . Dyslipidemia   . Eczema   . Elevated PSA 01/11/2012   25.77  . Frequency of urination   . Glaucoma PT STATES RX RAN OUT  . Glaucoma   . Hematuria   . Herniated disc   . History of kidney stones   . History of radiation therapy 06/07/12-08/02/12   prostate, 7800 cGy/40 sessions/ 5600cGy pelvic lymph nodes/40 sessions  . History of shingles   . History of TIA (transient ischemic attack) 2010--  NO RESIDUAL   NONE SINCE  . Hypertension   . Nocturia   . Prostate cancer ([place]) 03/2012  . Renal calculi BILATERAL   . Urgency of urination   . Weakness of both legs     Patient Active Problem List   Diagnosis Date Noted  . Kidney stones 10/19/2019  . History of prostate cancer  10/19/2019  . Acute pyelonephritis 06/29/2017  . Lower urinary tract infectious disease 06/26/2017  . Acute renal failure superimposed on stage 3 chronic kidney disease ([place]) 06/26/2017  . TIA (transient ischemic attack) 06/26/2017  . Anxiety 06/26/2017  . Prostate cancer ([place]) 05/01/2012  . Other and unspecified hyperlipidemia 04/12/2012  . Chest pain, unspecified 04/12/2012  . 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 Essential hypertension, benign 04/11/2012    Past Surgical History:  Procedure Laterality Date  . BILIARY STENT PLACEMENT  06/15/2011   Procedure: BILIARY STENT PLACEMENT;  Surgeon: Franchot Gallo, MD;  Location: [facility];  Service: Urology;  Laterality: Bilateral;  . CYSTO/ BILATERAL RETROGRADE PYELOGRAM/ LEFT URETERSCOPIC STONE EXTRACTION  11-14-2005   LEFT URETERAL CALCULUS  . CYSTO/ BILATERAL URETERAL STENTS  11-06-2005  . CYSTO/ LEFT RETROGRADE PYELOGRAM/  LEFT STENT PLACMENT  05-21-2011  . CYSTOSCOPY W/ URETERAL STENT REMOVAL  06/15/2011   Procedure: CYSTOSCOPY WITH STENT REMOVAL;  Surgeon: Franchot Gallo, MD;  Location: [facility];  Service: Urology;  Laterality: Left;  . EXTRACORPOREAL SHOCK WAVE LITHOTRIPSY    . INTERNAL URETHROTOMY/ TURP  12-23-2002   BPH W/ STRICTURE  . PERCUTANEOUS NEPHROLITHOTOMY  12-10-2007  &  11-07-2005   RIGHT RENAL CALCULI  .  REPAIR LEFT INGUINAL HERNIA W/ MESH  12-23-2002  . RIGHT URETERAL STENT PLACEMENT  02-10-2003  . RIGHT URETEROSCOPIC STONE EXTRACTION  05-29-2003  . TRANSTHORACIC ECHOCARDIOGRAM  12-19-2008   LVSF NORMAL/ EF 28-78%/ GRADE I DIASTOLIC DYSFUNCTION/   . TRANSURETHRAL RESECTION OF BLADDER  2004  . URETEROSCOPY  06/15/2011   Procedure: URETEROSCOPY;  Surgeon: Franchot Gallo, MD;  Location: [facility];  Service: Urology;  Laterality: Bilateral;       Family History  Problem Relation Age of Onset  . Hypertension Mother   . Stroke Father   . Hypertension  Sister   . Hypertension Brother     Social History   Tobacco Use  . Smoking status: Former Smoker    Years: 40.00    Types: Cigarettes    Quit date: 06/10/1990    Years since quitting: 29.3  . Smokeless tobacco: Never Used  Substance Use Topics  . Alcohol use: No  . Drug use: No    Home Medications Prior to Admission medications   Medication Sig Start Date End Date Taking? Authorizing Provider  ALPRAZolam (XANAX) 0.25 MG tablet Take 1 tablet (0.25 mg total) by mouth at bedtime as needed for anxiety. 12/27/17   Ripley Fraise, MD  amLODipine (NORVASC) 10 MG tablet Take 1 tablet (10 mg total) by mouth daily. 03/16/17   Jaynee Eagles, PA-C  aspirin 81 MG EC tablet Take 1 tablet (81 mg total) by mouth daily. Patient not taking: Reported on 12/27/2017 06/29/17   Dessa Phi, DO  atenolol (TENORMIN) 50 MG tablet Take 1 tablet (50 mg total) by mouth daily. Pt needs appointment for further refills 10/22/17   Delia Chimes A, MD  DULoxetine (CYMBALTA) 30 MG capsule Take 30 mg by mouth daily.    [provider]  HYDROcodone-acetaminophen (NORCO) 10-325 MG per tablet Take 1 tablet by mouth 4 (four) times daily as needed for moderate pain or severe pain.  02/12/14   [provider]  nitrofurantoin, macrocrystal-monohydrate, (MACROBID) 100 MG capsule Take 1 capsule (100 mg total) by mouth 2 (two) times daily for 7 days. 10/19/19 10/26/19  Gaylan Gerold, DO  SIMBRINZA 1-0.2 % SUSP Place 1 drop into both eyes 3 (three) times a week.  02/19/17   [provider]  timolol (TIMOPTIC) 0.5 % ophthalmic solution Place 1 drop into both eyes 2 (two) times daily as needed (pressure).  01/11/17   [provider]    Allergies    Tape, Doxycycline hyclate, and Sulfa antibiotics  Review of Systems   Review of Systems  Constitutional: Positive for fever.  Gastrointestinal: Positive for constipation. Negative for abdominal pain.  Genitourinary: Positive for decreased urine  volume, difficulty urinating, dysuria, flank pain, frequency and urgency.  Musculoskeletal: Positive for back pain.  Neurological: Negative for speech difficulty.  Psychiatric/Behavioral: Negative for confusion.    Physical Exam Updated Vital Signs BP (!) 141/85   Pulse 98   Temp 100.1 F (37.8 C) (Oral)   Resp 18   SpO2 98%   Physical Exam Constitutional:      General: He is not in acute distress. HENT:     Head: Normocephalic.  Eyes:     General:        Right eye: No discharge.        Left eye: No discharge.  Cardiovascular:     Rate and Rhythm: Normal rate and regular rhythm.  Pulmonary:     Effort: No respiratory distress.     Breath sounds:  Normal breath sounds.  Abdominal:     General: Bowel sounds are normal. There is no distension.     Palpations: Abdomen is soft.     Tenderness: There is no right CVA tenderness or left CVA tenderness.     Comments: Very mild tenderness to palpation of right lower quadrant that radiates to his lower back.  No guarding.  Genitourinary:    Comments: Retracted penis, no penile discharge. Prostate non tender on digital exam Skin:    General: Skin is warm.     Coloration: Skin is not jaundiced.  Neurological:     Mental Status: He is alert.  Psychiatric:        Mood and Affect: Mood normal.     ED Results / Procedures / Treatments   Labs (all labs ordered are listed, but only abnormal results are displayed) Labs Reviewed  CBC WITH DIFFERENTIAL/PLATELET - Abnormal; Notable for the following components:      Result Value   RBC 3.42 (*)    Hemoglobin 11.4 (*)    HCT 35.7 (*)    MCV 104.4 (*)    Platelets 137 (*)    Neutro Abs 8.6 (*)    Lymphs Abs 0.5 (*)    Abs Immature Granulocytes 0.17 (*)    All other components within normal limits  BASIC METABOLIC PANEL - Abnormal; Notable for the following components:   Glucose, Bld 157 (*)    Creatinine, Ser 1.89 (*)    GFR, Estimated 33 (*)    All other components within  normal limits  URINALYSIS, ROUTINE W REFLEX MICROSCOPIC - Abnormal; Notable for the following components:   Color, Urine AMBER (*)    APPearance CLOUDY (*)    Hgb urine dipstick SMALL (*)    Protein, ur 100 (*)    Nitrite POSITIVE (*)    Leukocytes,Ua MODERATE (*)    Bacteria, UA FEW (*)    All other components within normal limits  URINE CULTURE    EKG None  Radiology CT Renal Stone Study  Result Date: 10/19/2019 CLINICAL DATA:  Flank pain, urinary retention. EXAM: CT ABDOMEN AND PELVIS WITHOUT CONTRAST TECHNIQUE: Multidetector CT imaging of the abdomen and pelvis was performed following the standard protocol without IV contrast. COMPARISON:  12/25/2017 FINDINGS: Lower chest: No acute abnormality Hepatobiliary: Numerous low-density areas within the liver, likely cysts. These are unchanged since prior study. Gallbladder unremarkable. Pancreas: No focal abnormality or ductal dilatation. Spleen: No focal abnormality.  Normal size. Adrenals/Urinary Tract: Large stone within the right renal pelvis measuring 15 mm. Right lower pole renal stone measuring 9 mm. No hydronephrosis. No ureteral stones. Adrenal glands unremarkable. Urinary bladder appears thick walled. Stomach/Bowel: Sigmoid diverticulosis. No active diverticulitis. Normal appendix. Stomach and small bowel decompressed, unremarkable. Vascular/Lymphatic: Aortic atherosclerosis. No evidence of aneurysm or adenopathy. Reproductive: Prostate enlargement. Low-density area within the left side of the prostate measures 3 cm. Brachytherapy seeds in the prostate. Other: No free fluid or free air. Supraumbilical ventral hernia containing fat. Musculoskeletal: No acute bony abnormality. IMPRESSION: Right renal pelvic and lower pole stones. No ureteral stones or hydronephrosis. Multiple hepatic cysts. Prominent prostate. Low-density area in the left side of the prostate with surrounding radiation seeds. This could reflect prosthetic mass or abscess.  Mild bladder wall thickening, likely related to prostate enlargement and bladder outlet obstruction. Aortic atherosclerosis. Electronically Signed   By: Rolm Baptise M.D.   On: 10/19/2019 17:41    Procedures Procedures (including critical care time)  Medications Ordered in  ED Medications  amLODipine (NORVASC) tablet 10 mg (10 mg Oral Given 10/19/19 1829)  atenolol (TENORMIN) tablet 50 mg (50 mg Oral Given 10/19/19 1829)    ED Course  I have reviewed the triage vital signs and the nursing notes.  Pertinent labs & imaging results that were available during my care of the patient were reviewed by me and considered in my medical decision making (see chart for details).  Patient seen and examined.  He is alert and oriented.  He reports symptoms of dysuria, frequency and urgency for the last few days.  He complains of flank pain but no CVA tenderness bilaterally on examination.  UA shows small Hgb with positive Nitrites and Leuko.  Creatine 1.89 which is stable compared to 2019. BUN WNL.  Given his history of kidney stones and pyelonephritis in the past, obtained CT of renal stone to rule out any obstructions or pyelonephritis.  POC ultrasound reveals a nondistended bladder of about 20 cc.  Bilateral kidneys appear grossly normal on ultrasound however this is a limited study.  Patient is high risk for UTI given his diaper wear and retracted penis.  Low suspicion for prostatitis due to unremarkable digital exam.  Per chart review, urine culture in August 2016 grew ESBL E. Coli, which he was given fosfomycin.  No growth in urine culture till today.  CT renal shows 2 kidney stones in the right renal pelvic and lower pole.  Unsure if this is a chronic or an acute issue.  No evidence of pyelonephritis or hydronephrosis.  Patient is clinically well with no abdominal pain.  He is instructed to call [facility] for outpatient follow-up and treatment of his kidney stones.  CT also show prominent prostate  with surrounding radiation seeds concerning for prostate mass or abscess.  Patient does have history of prostate cancer but have not been following a urologist for several years.  Instructed patient to call [facility] to make a follow-up appointment soon as possible.  Prescribed Macrobid 100 mg twice daily for 7 days for his UTI.  Come back to the ED for worsening pain or anuria.  Patient agrees with the plan    MDM Rules/Calculators/A&P                          Patient presents the ED with symptoms of dysuria and frequency.  UA is consistent with UTI.  He is high risk for UTI due to his chronic diaper use and retracted penis.  Low suspicion for prostatitis due to nontender prostate on digital exam.  Point-of-care ultrasound shows a nondistended bladder with a volume calculated of 20 cc.  CT renal showed 2 kidney stones in the right renal pelvic and lower pole.  No evidence for hydronephrosis or pyelonephritis.  Unsure if his kidney stones is an acute or chronic problem.  Due to his absence of symptoms and hemodynamically stable, patient will be instructed to follow-up with [facility] outpatient for treatment of his kidney stones.  CBC unremarkable.  BMP shows creatinine of 1.89 which is stable since 2019. Prescribed Macrobid for treatment of UTI.  Patient does have history of prostate cancer but lost to follow-up with his urologist for several years.  CT renal shows prominent prostate concerning for prostate mass or abscess.  Advised patient to follow-up with [facility] for further evaluation.  Come back to the ED for worsening pain or anuria.  Final Clinical Impression(s) / ED Diagnoses Final diagnoses:  Lower urinary tract infectious disease  Kidney stones  History of prostate cancer    Rx / DC Orders ED Discharge Orders         Ordered    nitrofurantoin, macrocrystal-monohydrate, (MACROBID) 100 MG capsule  2 times daily        10/19/19 1829           Gaylan Gerold,  DO 10/19/19 1842    Carmin Muskrat, MD 10/20/19 2324

## 2019-10-19 NOTE — ED Triage Notes (Signed)
Patient reports urinary retention , last voided yesterday , denies fever or chills , history of urethral stents and kidney stones.

## 2019-10-19 NOTE — Discharge Instructions (Addendum)
Mr. Dannemiller, you came to the ED for symptoms of frequency and pain with urination.  This is likely due to a bladder infection.  I will send out an antibiotic Macrobid, please take it 2 times a day for 7 days.  The CT scan of your abdomen also shows 2 kidney stones on the right kidney.  It also shows a prominent prostate with radiation seeds.  I would like you to follow-up with a urologist for further evaluation and treatment of the kidney stones and prostate cancer.    Please call this number for appointment with [facility] [address] [place], [place], [place] [address] Phone: [phone]  Please come back to ED for worsening pain or inability to urinate.  Take care

## 2019-10-19 NOTE — ED Notes (Signed)
Patient verbalizes understanding of discharge instructions. Opportunity for questioning and answers were provided. Armband removed by staff, pt discharged from ED ambulatory.   

## 2019-10-20 LAB — URINE CULTURE: Culture: >=100000 — AB

## 2019-10-25 ENCOUNTER — Inpatient Hospital Stay (HOSPITAL_COMMUNITY)
Admission: EM | Admit: 2019-10-25 | Discharge: 2019-11-03 | DRG: 872 | Disposition: A | Discharge status: Home Health | Payer: Medicare PPO | Attending: Family Medicine | Admitting: Family Medicine

## 2019-10-25 ENCOUNTER — Emergency Department (HOSPITAL_COMMUNITY): Payer: Medicare PPO

## 2019-10-25 ENCOUNTER — Encounter (HOSPITAL_COMMUNITY): Payer: Self-pay | Admitting: Emergency Medicine

## 2019-10-25 ENCOUNTER — Other Ambulatory Visit: Payer: Self-pay

## 2019-10-25 DIAGNOSIS — N183 Chronic kidney disease, stage 3 unspecified: Secondary | ICD-10-CM | POA: Diagnosis not present

## 2019-10-25 DIAGNOSIS — N12 Tubulo-interstitial nephritis, not specified as acute or chronic: Secondary | ICD-10-CM | POA: Diagnosis present

## 2019-10-25 DIAGNOSIS — Z823 Family history of stroke: Secondary | ICD-10-CM

## 2019-10-25 DIAGNOSIS — Z8546 Personal history of malignant neoplasm of prostate: Secondary | ICD-10-CM | POA: Diagnosis not present

## 2019-10-25 DIAGNOSIS — H409 Unspecified glaucoma: Secondary | ICD-10-CM | POA: Diagnosis present

## 2019-10-25 DIAGNOSIS — R338 Other retention of urine: Secondary | ICD-10-CM | POA: Diagnosis not present

## 2019-10-25 DIAGNOSIS — Z8249 Family history of ischemic heart disease and other diseases of the circulatory system: Secondary | ICD-10-CM

## 2019-10-25 DIAGNOSIS — Z881 Allergy status to other antibiotic agents status: Secondary | ICD-10-CM | POA: Diagnosis not present

## 2019-10-25 DIAGNOSIS — Z923 Personal history of irradiation: Secondary | ICD-10-CM

## 2019-10-25 DIAGNOSIS — D539 Nutritional anemia, unspecified: Secondary | ICD-10-CM | POA: Diagnosis present

## 2019-10-25 DIAGNOSIS — Z87891 Personal history of nicotine dependence: Secondary | ICD-10-CM | POA: Diagnosis not present

## 2019-10-25 DIAGNOSIS — N390 Urinary tract infection, site not specified: Secondary | ICD-10-CM | POA: Diagnosis not present

## 2019-10-25 DIAGNOSIS — C61 Malignant neoplasm of prostate: Secondary | ICD-10-CM | POA: Diagnosis present

## 2019-10-25 DIAGNOSIS — N433 Hydrocele, unspecified: Secondary | ICD-10-CM | POA: Diagnosis present

## 2019-10-25 DIAGNOSIS — Z87442 Personal history of urinary calculi: Secondary | ICD-10-CM

## 2019-10-25 DIAGNOSIS — K5900 Constipation, unspecified: Secondary | ICD-10-CM | POA: Diagnosis not present

## 2019-10-25 DIAGNOSIS — Z20822 Contact with and (suspected) exposure to covid-19: Secondary | ICD-10-CM | POA: Diagnosis present

## 2019-10-25 DIAGNOSIS — Z8673 Personal history of transient ischemic attack (TIA), and cerebral infarction without residual deficits: Secondary | ICD-10-CM | POA: Diagnosis not present

## 2019-10-25 DIAGNOSIS — Z882 Allergy status to sulfonamides status: Secondary | ICD-10-CM | POA: Diagnosis not present

## 2019-10-25 DIAGNOSIS — A419 Sepsis, unspecified organism: Principal | ICD-10-CM | POA: Diagnosis present

## 2019-10-25 DIAGNOSIS — N136 Pyonephrosis: Secondary | ICD-10-CM | POA: Diagnosis present

## 2019-10-25 DIAGNOSIS — Z79899 Other long term (current) drug therapy: Secondary | ICD-10-CM

## 2019-10-25 DIAGNOSIS — N179 Acute kidney failure, unspecified: Secondary | ICD-10-CM | POA: Diagnosis present

## 2019-10-25 DIAGNOSIS — F419 Anxiety disorder, unspecified: Secondary | ICD-10-CM | POA: Diagnosis present

## 2019-10-25 DIAGNOSIS — I129 Hypertensive chronic kidney disease with stage 1 through stage 4 chronic kidney disease, or unspecified chronic kidney disease: Secondary | ICD-10-CM | POA: Diagnosis present

## 2019-10-25 DIAGNOSIS — G894 Chronic pain syndrome: Secondary | ICD-10-CM | POA: Diagnosis present

## 2019-10-25 DIAGNOSIS — N189 Chronic kidney disease, unspecified: Secondary | ICD-10-CM | POA: Diagnosis present

## 2019-10-25 DIAGNOSIS — N412 Abscess of prostate: Secondary | ICD-10-CM | POA: Diagnosis present

## 2019-10-25 DIAGNOSIS — I10 Essential (primary) hypertension: Secondary | ICD-10-CM | POA: Diagnosis not present

## 2019-10-25 DIAGNOSIS — H5462 Unqualified visual loss, left eye, normal vision right eye: Secondary | ICD-10-CM | POA: Diagnosis present

## 2019-10-25 DIAGNOSIS — E872 Acidosis, unspecified: Secondary | ICD-10-CM | POA: Diagnosis present

## 2019-10-25 DIAGNOSIS — N1832 Chronic kidney disease, stage 3b: Secondary | ICD-10-CM | POA: Diagnosis present

## 2019-10-25 DIAGNOSIS — A4151 Sepsis due to Escherichia coli [E. coli]: Secondary | ICD-10-CM | POA: Diagnosis not present

## 2019-10-25 DIAGNOSIS — H5712 Ocular pain, left eye: Secondary | ICD-10-CM | POA: Diagnosis not present

## 2019-10-25 LAB — RESPIRATORY PANEL BY RT PCR (FLU A&B, COVID)
Influenza A by PCR: NEGATIVE
Influenza B by PCR: NEGATIVE
SARS Coronavirus 2 by RT PCR: NEGATIVE

## 2019-10-25 LAB — CBC
HCT: 34.4 % — ABNORMAL LOW (ref 39.0–52.0)
Hemoglobin: 10.6 g/dL — ABNORMAL LOW (ref 13.0–17.0)
MCH: 32.5 pg (ref 26.0–34.0)
MCHC: 30.8 g/dL (ref 30.0–36.0)
MCV: 105.5 fL — ABNORMAL HIGH (ref 80.0–100.0)
Platelets: 245 10*3/uL (ref 150–400)
RBC: 3.26 MIL/uL — ABNORMAL LOW (ref 4.22–5.81)
RDW: 12.7 % (ref 11.5–15.5)
WBC: 21.7 10*3/uL — ABNORMAL HIGH (ref 4.0–10.5)
nRBC: 0.1 % (ref 0.0–0.2)

## 2019-10-25 LAB — COMPREHENSIVE METABOLIC PANEL
ALT: 23 U/L (ref 0–44)
AST: 34 U/L (ref 15–41)
Albumin: 2.9 g/dL — ABNORMAL LOW (ref 3.5–5.0)
Alkaline Phosphatase: 89 U/L (ref 38–126)
Anion gap: 12 (ref 5–15)
BUN: 19 mg/dL (ref 8–23)
CO2: 25 mmol/L (ref 22–32)
Calcium: 9.1 mg/dL (ref 8.9–10.3)
Chloride: 98 mmol/L (ref 98–111)
Creatinine, Ser: 2.49 mg/dL — ABNORMAL HIGH (ref 0.61–1.24)
GFR, Estimated: 26 mL/min — ABNORMAL LOW (ref >60)
Glucose, Bld: 162 mg/dL — ABNORMAL HIGH (ref 70–99)
Potassium: 4 mmol/L (ref 3.5–5.1)
Sodium: 135 mmol/L (ref 135–145)
Total Bilirubin: 1.1 mg/dL (ref 0.3–1.2)
Total Protein: 7.6 g/dL (ref 6.5–8.1)

## 2019-10-25 LAB — URINALYSIS, ROUTINE W REFLEX MICROSCOPIC
Bilirubin Urine: NEGATIVE
Glucose, UA: NEGATIVE mg/dL
Ketones, ur: 5 mg/dL — AB
Nitrite: POSITIVE — AB
Protein, ur: 100 mg/dL — AB
RBC / HPF: >50 RBC/hpf — ABNORMAL HIGH (ref 0–5)
Specific Gravity, Urine: 1.012 (ref 1.005–1.030)
pH: 5 (ref 5.0–8.0)

## 2019-10-25 LAB — PROTIME-INR
INR: 1.4 — ABNORMAL HIGH (ref 0.8–1.2)
Prothrombin Time: 16.3 seconds — ABNORMAL HIGH (ref 11.4–15.2)

## 2019-10-25 LAB — LIPASE, BLOOD: Lipase: 32 U/L (ref 11–51)

## 2019-10-25 LAB — LACTIC ACID, PLASMA: Lactic Acid, Venous: 2.1 mmol/L (ref 0.5–1.9)

## 2019-10-25 LAB — APTT: aPTT: 38 seconds — ABNORMAL HIGH (ref 24–36)

## 2019-10-25 MED ORDER — ACETAMINOPHEN 500 MG PO TABS
1000.0000 mg | ORAL_TABLET | Freq: Once | ORAL | Status: AC
  Administered 2019-10-25: 1000 mg via ORAL
  Filled 2019-10-25: qty 2

## 2019-10-25 MED ORDER — SODIUM CHLORIDE 0.9 % IV SOLN
2.0000 g | INTRAVENOUS | Status: DC
  Administered 2019-10-25 – 2019-10-27 (×3): 2 g via INTRAVENOUS
  Filled 2019-10-25 (×3): qty 2

## 2019-10-25 MED ORDER — LACTATED RINGERS IV BOLUS (SEPSIS)
1000.0000 mL | Freq: Once | INTRAVENOUS | Status: AC
  Administered 2019-10-25: 1000 mL via INTRAVENOUS

## 2019-10-25 MED ORDER — LACTATED RINGERS IV BOLUS
500.0000 mL | Freq: Once | INTRAVENOUS | Status: AC
  Administered 2019-10-25: 500 mL via INTRAVENOUS

## 2019-10-25 MED ORDER — SODIUM CHLORIDE 0.9 % IV SOLN
1.0000 g | INTRAVENOUS | Status: DC
  Administered 2019-10-25: 1 g via INTRAVENOUS
  Filled 2019-10-25: qty 10

## 2019-10-25 MED ORDER — MORPHINE SULFATE (PF) 2 MG/ML IV SOLN
2.0000 mg | Freq: Once | INTRAVENOUS | Status: AC
  Administered 2019-10-25: 2 mg via INTRAVENOUS
  Filled 2019-10-25: qty 1

## 2019-10-25 MED ORDER — LACTATED RINGERS IV SOLN: INTRAVENOUS | Status: DC

## 2019-10-25 MED ORDER — ACETAMINOPHEN 325 MG PO TABS: 650.0000 mg | ORAL_TABLET | Freq: Once | ORAL | Status: DC

## 2019-10-25 NOTE — ED Notes (Signed)
Pt's spouse is at bedside. Pt is in radiology at this time.

## 2019-10-25 NOTE — ED Notes (Signed)
Dr. Ashok Cordia notified of pt's heart rate, BP, and temperature - 103.1 oral, 150's HR, and BP 164/115.

## 2019-10-25 NOTE — Progress Notes (Signed)
Pharmacy Antibiotic Note  [name] is a 78 y.o. male admitted on 10/25/2019 with Pyleo.  Pharmacy has been consulted for Cefepime dosing.   Height: 6' (182.9 cm) Weight: 93.9 kg (207 lb) IBW/kg (Calculated) : 77.6  Temp (24hrs), Avg:100.3 F (37.9 C), Min:98.7 F (37.1 C), Max:103.1 F (39.5 C)  Recent Labs  Lab 10/19/19 0732 10/25/19 1330 10/25/19 1537  WBC 10.0 21.7*  --   CREATININE 1.89* 2.49*  --   LATICACIDVEN  --   --  2.1*    Estimated Creatinine Clearance: 28.6 mL/min (A) (by C-G formula based on SCr of 2.49 mg/dL (H)).    Allergies  Allergen Reactions  . Tape Other (See Comments)    Redness and blisters/paper tape-not latex  . Doxycycline Hyclate Other (See Comments)    Mental changes   . Sulfa Antibiotics Hives    Antimicrobials this admission: 10/22 Cefepime >>      Dose adjustments this admission: N/a  Microbiology results: Pending   Plan:  - Cefepime 2g IV q24h - Monitor patients renal function and urine output  - De-escalate ABX when appropriate   Thank you for allowing pharmacy to be a part of this patient's care.  Duanne Limerick PharmD. BCPS 10/25/2019 10:41 PM

## 2019-10-25 NOTE — ED Triage Notes (Signed)
Pt endorses urinary retention and constipation. Pt unsure of last void cause he says he is incontinent. Endorses abd pain. Bladder scan in triage is 255 ml.

## 2019-10-25 NOTE — Consult Note (Addendum)
Urology Consult   Physician requesting consult: Roosevelt Locks, MD  Reason for consult: Urinary retention and difficult Foley catheter  History of Present Illness: [name] is a 79 y.o. male with a 1 week of worsening lower urinary tract symptoms and an inability to void.  The nursing staff made multiple attempts to place a Foley catheter while in the emergency department, but were unsuccessful.  The patient was seen in the emergency department for a similar issue on 10/19/2019 and treated for suspected UTI.  He had a CT scan at that time that revealed a 3 cm low-density area adjacent to the prostate, concerning for possible abscess along with a right sided staghorn calculus.  The patient is currently followed by Dr. Diona Fanti and has a significant history of kidney stones and required PCNL in 2012.  The patient also states that he had a TURP several years ago, but cannot recollect when or who his surgeon was.   Past Medical History:  Diagnosis Date  . Anxiety   . Arthritis   . Bilateral flank pain    DUE TO KIDNEY STONES  . Chronic back pain   . Dyslipidemia   . Eczema   . Elevated PSA 01/11/2012   25.77  . Frequency of urination   . Glaucoma PT STATES RX RAN OUT  . Glaucoma   . Hematuria   . Herniated disc   . History of kidney stones   . History of radiation therapy 06/07/12-08/02/12   prostate, 7800 cGy/40 sessions/ 5600cGy pelvic lymph nodes/40 sessions  . History of shingles   . History of TIA (transient ischemic attack) 2010--  NO RESIDUAL   NONE SINCE  . Hypertension   . Nocturia   . Prostate cancer ([place]) 03/2012  . Renal calculi BILATERAL   . Urgency of urination   . Weakness of both legs     Past Surgical History:  Procedure Laterality Date  . BILIARY STENT PLACEMENT  06/15/2011   Procedure: BILIARY STENT PLACEMENT;  Surgeon: Franchot Gallo, MD;  Location: [facility];  Service: Urology;  Laterality: Bilateral;  . CYSTO/ BILATERAL RETROGRADE  PYELOGRAM/ LEFT URETERSCOPIC STONE EXTRACTION  11-14-2005   LEFT URETERAL CALCULUS  . CYSTO/ BILATERAL URETERAL STENTS  11-06-2005  . CYSTO/ LEFT RETROGRADE PYELOGRAM/  LEFT STENT PLACMENT  05-21-2011  . CYSTOSCOPY W/ URETERAL STENT REMOVAL  06/15/2011   Procedure: CYSTOSCOPY WITH STENT REMOVAL;  Surgeon: Franchot Gallo, MD;  Location: [facility];  Service: Urology;  Laterality: Left;  . EXTRACORPOREAL SHOCK WAVE LITHOTRIPSY    . INTERNAL URETHROTOMY/ TURP  12-23-2002   BPH W/ STRICTURE  . PERCUTANEOUS NEPHROLITHOTOMY  12-10-2007  &  11-07-2005   RIGHT RENAL CALCULI  . REPAIR LEFT INGUINAL HERNIA W/ MESH  12-23-2002  . RIGHT URETERAL STENT PLACEMENT  02-10-2003  . RIGHT URETEROSCOPIC STONE EXTRACTION  05-29-2003  . TRANSTHORACIC ECHOCARDIOGRAM  12-19-2008   LVSF NORMAL/ EF 23-76%/ GRADE I DIASTOLIC DYSFUNCTION/   . TRANSURETHRAL RESECTION OF BLADDER  2004  . URETEROSCOPY  06/15/2011   Procedure: URETEROSCOPY;  Surgeon: Franchot Gallo, MD;  Location: [facility];  Service: Urology;  Laterality: Bilateral;    Current Hospital Medications:  Home Meds:  Current Meds  Medication Sig  . ALPHAGAN P 0.1 % SOLN Place 1 drop into the left eye every 8 (eight) hours as needed (eye pressure).   . ALPRAZolam (XANAX) 0.5 MG tablet Take 0.5 mg by mouth 2 (two) times daily as needed for  anxiety.   Marland Kitchen amLODipine (NORVASC) 10 MG tablet Take 1 tablet (10 mg total) by mouth daily.  Marland Kitchen atenolol (TENORMIN) 50 MG tablet Take 1 tablet (50 mg total) by mouth daily. Pt needs appointment for further refills  . HYDROcodone-acetaminophen (NORCO) 10-325 MG per tablet Take 1 tablet by mouth 4 (four) times daily as needed for moderate pain or severe pain.   . nitrofurantoin, macrocrystal-monohydrate, (MACROBID) 100 MG capsule Take 1 capsule (100 mg total) by mouth 2 (two) times daily for 7 days.  Marland Kitchen ROCKLATAN 0.02-0.005 % SOLN Apply 1 drop to eye at bedtime.  . timolol (TIMOPTIC) 0.5  % ophthalmic solution Place 1 drop into both eyes 2 (two) times daily as needed (pressure).     Scheduled Meds: Continuous Infusions: PRN Meds:.  Allergies:  Allergies  Allergen Reactions  . Tape Other (See Comments)    Redness and blisters/paper tape-not latex  . Doxycycline Hyclate Other (See Comments)    Mental changes   . Sulfa Antibiotics Hives    Family History  Problem Relation Age of Onset  . Hypertension Mother   . Stroke Father   . Hypertension Sister   . Hypertension Brother     Social History:  reports that he quit smoking about 29 years ago. His smoking use included cigarettes. He quit after 40.00 years of use. He has never used smokeless tobacco. He reports that he does not drink alcohol and does not use drugs.  ROS: A complete review of systems was performed.  All systems are negative except for pertinent findings as noted.  Physical Exam:  Vital signs in last 24 hours: Temp:  [98.7 F (37.1 C)-99 F (37.2 C)] 99 F (37.2 C) (10/22 1448) Pulse Rate:  [84-127] 111 (10/22 1730) Resp:  [15-23] 18 (10/22 1730) BP: (113-134)/(80-114) 132/114 (10/22 1730) SpO2:  [91 %-100 %] 91 % (10/22 1730) Weight:  [93.9 kg] 93.9 kg (10/22 1252) Constitutional:  Alert and oriented, No acute distress Cardiovascular: Regular rate and rhythm, No JVD Respiratory: Normal respiratory effort, Lungs clear bilaterally GI: Abdomen is soft, nontender, nondistended, no abdominal masses GU: No CVA tenderness.  Large, bilateral nontender hydroceles.  Penis is uncircumcised Lymphatic: No lymphadenopathy Neurologic: Grossly intact, no focal deficits Psychiatric: Normal mood and affect  Laboratory Data:  Recent Labs    10/25/19 1330  WBC 21.7*  HGB 10.6*  HCT 34.4*  PLT 245    Recent Labs    10/25/19 1330  NA 135  K 4.0  CL 98  GLUCOSE 162*  BUN 19  CALCIUM 9.1  CREATININE 2.49*     Results for orders placed or performed during the hospital encounter of 10/25/19  (from the past 24 hour(s))  Lipase, blood     Status: None   Collection Time: 10/25/19  1:30 PM  Result Value Ref Range   Lipase 32 11 - 51 U/L  Comprehensive metabolic panel     Status: Abnormal   Collection Time: 10/25/19  1:30 PM  Result Value Ref Range   Sodium 135 135 - 145 mmol/L   Potassium 4.0 3.5 - 5.1 mmol/L   Chloride 98 98 - 111 mmol/L   CO2 25 22 - 32 mmol/L   Glucose, Bld 162 (H) 70 - 99 mg/dL   BUN 19 8 - 23 mg/dL   Creatinine, Ser 2.49 (H) 0.61 - 1.24 mg/dL   Calcium 9.1 8.9 - 10.3 mg/dL   Total Protein 7.6 6.5 - 8.1 g/dL   Albumin 2.9 (L) 3.5 -  5.0 g/dL   AST 34 15 - 41 U/L   ALT 23 0 - 44 U/L   Alkaline Phosphatase 89 38 - 126 U/L   Total Bilirubin 1.1 0.3 - 1.2 mg/dL   GFR, Estimated 26 (L) >60 mL/min   Anion gap 12 5 - 15  CBC     Status: Abnormal   Collection Time: 10/25/19  1:30 PM  Result Value Ref Range   WBC 21.7 (H) 4.0 - 10.5 K/uL   RBC 3.26 (L) 4.22 - 5.81 MIL/uL   Hemoglobin 10.6 (L) 13.0 - 17.0 g/dL   HCT 34.4 (L) 39 - 52 %   MCV 105.5 (H) 80.0 - 100.0 fL   MCH 32.5 26.0 - 34.0 pg   MCHC 30.8 30.0 - 36.0 g/dL   RDW 12.7 11.5 - 15.5 %   Platelets 245 150 - 400 K/uL   nRBC 0.1 0.0 - 0.2 %  Lactic acid, plasma     Status: Abnormal   Collection Time: 10/25/19  3:37 PM  Result Value Ref Range   Lactic Acid, Venous 2.1 (HH) 0.5 - 1.9 mmol/L   Recent Results (from the past 240 hour(s))  Urine culture     Status: Abnormal   Collection Time: 10/19/19  4:38 PM   Specimen: Urine, Random  Result Value Ref Range Status   Specimen Description URINE, RANDOM  Final   Special Requests   Final    NONE Performed at [facility], [address] [place], [place] [address]    Culture (A)  Final    >=100,000 COLONIES/mL MULTIPLE SPECIES PRESENT, SUGGEST RECOLLECTION   Report Status 10/20/2019 FINAL  Final    Renal Function: Recent Labs    10/19/19 0732 10/25/19 1330  CREATININE 1.89* 2.49*   Estimated Creatinine Clearance: 28.6 mL/min (A)  (by C-G formula based on SCr of 2.49 mg/dL (H)).  Radiologic Imaging: DG Chest Portable 1 View  Result Date: 10/25/2019 CLINICAL DATA:  Fever urinary retention EXAM: PORTABLE CHEST 1 VIEW COMPARISON:  12/27/2017 FINDINGS: The heart size and mediastinal contours are within normal limits. Both lungs are clear. The visualized skeletal structures are unremarkable. IMPRESSION: No active disease. Electronically Signed   By: Donavan Foil M.D.   On: 10/25/2019 17:38   CLINICAL DATA:  Flank pain, urinary retention.  EXAM: CT ABDOMEN AND PELVIS WITHOUT CONTRAST  TECHNIQUE: Multidetector CT imaging of the abdomen and pelvis was performed following the standard protocol without IV contrast.  COMPARISON:  12/25/2017  FINDINGS: Lower chest: No acute abnormality  Hepatobiliary: Numerous low-density areas within the liver, likely cysts. These are unchanged since prior study. Gallbladder unremarkable.  Pancreas: No focal abnormality or ductal dilatation.  Spleen: No focal abnormality.  Normal size.  Adrenals/Urinary Tract: Large stone within the right renal pelvis measuring 15 mm. Right lower pole renal stone measuring 9 mm. No hydronephrosis. No ureteral stones. Adrenal glands unremarkable. Urinary bladder appears thick walled.  Stomach/Bowel: Sigmoid diverticulosis. No active diverticulitis. Normal appendix. Stomach and small bowel decompressed, unremarkable.  Vascular/Lymphatic: Aortic atherosclerosis. No evidence of aneurysm or adenopathy.  Reproductive: Prostate enlargement. Low-density area within the left side of the prostate measures 3 cm. Brachytherapy seeds in the prostate.  Other: No free fluid or free air. Supraumbilical ventral hernia containing fat.  Musculoskeletal: No acute bony abnormality.  IMPRESSION: Right renal pelvic and lower pole stones. No ureteral stones or hydronephrosis.  Multiple hepatic cysts.  Prominent prostate. Low-density area  in the left side of the prostate with  surrounding radiation seeds. This could reflect prosthetic mass or abscess.  Mild bladder wall thickening, likely related to prostate enlargement and bladder outlet obstruction.  Aortic atherosclerosis.   Electronically Signed   By: Rolm Baptise M.D.   On: 10/19/2019 17:41  I independently reviewed the above imaging studies.  Impression/Recommendation 79 year old male with a history of BPH with LUTS, now with acute urinary retention and difficult Foley catheter placement. Right staghorn calculus.   -An 61 French Silastic catheter was placed cystoscopically with return of approximately 1 L of amber urine.  There was an area of trauma in the bulbar urethra, likely from his previous failed catheter attempts. -Urine culture and sensitivity pending.  I will defer antibiotic coverage to the admitting team -Recommend follow-up CT to evaluate the low-density area adjacent to his prostate that was seen on his scan from 10/19/2019.   Ellison Hughs, MD [facility] 10/25/2019, 6:54 PM

## 2019-10-25 NOTE — ED Notes (Signed)
Multiple attempts to insert in and out cath and foley. Unsuccessful. MD notified.

## 2019-10-25 NOTE — Procedures (Signed)
Procedure Note  Preoperative diagnosis:  1.  Acute urinary retention 2.  Difficult Foley catheter placement  Postoperative diagnosis: 1.  Same  Procedure(s): 1.  Cystoscopy with insertion of urethral catheter  Surgeon: Ellison Hughs, MD  Assistants:  None  Anesthesia:  None  Complications:  None  EBL: 5 mL  Specimens: 1.  Urine specimen for culture and sensitivity  Drains/Catheters: 1.  18 French Silastic catheter with 10 mL in the balloon  Intraoperative findings:   1. Bulbar urethral trauma from prior catheter attempts  Indication:  [name] is a 79 y.o. male with a history of BPH, now with acute urinary retention.  Despite multiple attempts by the nursing staff and ER physicians, a catheter could not be placed.  Description of procedure:  After verbal consent was obtained, the patient was prepped and draped in usual sterile fashion.  A 16 French flexible cystoscope was then placed into the urethral meatus and advanced into the bulbar urethra where an area of catheter trauma was identified.  I was able to navigate a wire beyond this area of trauma and into the bladder.  Due to patient discomfort, the cystoscope was not advanced into the bladder.  I then removed the cystoscope, leaving the wire in place.  An 18 [place] council tip catheter was then advanced over the wire and into the bladder with return of amber urine.  Approximately 1 L of urine was drained.  The patient had instant relief.  The catheter was placed to gravity drainage and a urine specimen was sent for culture and sensitivity.  He tolerated the procedure well.  Plan: Recommend follow-up CT to evaluate the prostatic fluid density noted on his recent CT from 10/19/2019.  Keep Foley catheter in place for approximately 1 week.

## 2019-10-25 NOTE — ED Provider Notes (Signed)
[place] EMERGENCY DEPARTMENT Provider Note   CSN: [id number] Arrival date & time: 10/25/19  1244     History Chief Complaint  Patient presents with  . Urinary Retention  . Constipation    [name] is a 79 y.o. male.  The history is provided by the patient.  Dysuria Presenting symptoms: no penile pain and no scrotal pain   Relieved by:  Nothing Ineffective treatments:  Prescription drugs (antibiotics) Associated symptoms: diarrhea, fever, scrotal swelling, urinary frequency, urinary incontinence and urinary retention   Associated symptoms: no abdominal pain and no vomiting   Fever:    Duration:  3 days   Temp source:  Subjective Fever Associated symptoms: chills and diarrhea   Associated symptoms: no congestion, no cough, no headaches and no vomiting        Past Medical History:  Diagnosis Date  . Anxiety   . Arthritis   . Bilateral flank pain    DUE TO KIDNEY STONES  . Chronic back pain   . Dyslipidemia   . Eczema   . Elevated PSA 01/11/2012   25.77  . Frequency of urination   . Glaucoma PT STATES RX RAN OUT  . Glaucoma   . Hematuria   . Herniated disc   . History of kidney stones   . History of radiation therapy 06/07/12-08/02/12   prostate, 7800 cGy/40 sessions/ 5600cGy pelvic lymph nodes/40 sessions  . History of shingles   . History of TIA (transient ischemic attack) 2010--  NO RESIDUAL   NONE SINCE  . Hypertension   . Nocturia   . Prostate cancer ([place]) 03/2012  . Renal calculi BILATERAL   . Urgency of urination   . Weakness of both legs     Patient Active Problem List   Diagnosis Date Noted  . Sepsis secondary to UTI ([place]) 10/25/2019  . Kidney stones 10/19/2019  . History of prostate cancer 10/19/2019  . Acute pyelonephritis 06/29/2017  . Lower urinary tract infectious disease 06/26/2017  . Acute renal failure superimposed on stage 3 chronic kidney disease ([place]) 06/26/2017  . TIA (transient ischemic attack)  06/26/2017  . Anxiety 06/26/2017  . Prostate cancer ([place]) 05/01/2012  . Other and unspecified hyperlipidemia 04/12/2012  . Chest pain, unspecified 04/12/2012  . 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 Essential hypertension, benign 04/11/2012    Past Surgical History:  Procedure Laterality Date  . BILIARY STENT PLACEMENT  06/15/2011   Procedure: BILIARY STENT PLACEMENT;  Surgeon: Franchot Gallo, MD;  Location: [facility];  Service: Urology;  Laterality: Bilateral;  . CYSTO/ BILATERAL RETROGRADE PYELOGRAM/ LEFT URETERSCOPIC STONE EXTRACTION  11-14-2005   LEFT URETERAL CALCULUS  . CYSTO/ BILATERAL URETERAL STENTS  11-06-2005  . CYSTO/ LEFT RETROGRADE PYELOGRAM/  LEFT STENT PLACMENT  05-21-2011  . CYSTOSCOPY W/ URETERAL STENT REMOVAL  06/15/2011   Procedure: CYSTOSCOPY WITH STENT REMOVAL;  Surgeon: Franchot Gallo, MD;  Location: [facility];  Service: Urology;  Laterality: Left;  . EXTRACORPOREAL SHOCK WAVE LITHOTRIPSY    . INTERNAL URETHROTOMY/ TURP  12-23-2002   BPH W/ STRICTURE  . PERCUTANEOUS NEPHROLITHOTOMY  12-10-2007  &  11-07-2005   RIGHT RENAL CALCULI  . REPAIR LEFT INGUINAL HERNIA W/ MESH  12-23-2002  . RIGHT URETERAL STENT PLACEMENT  02-10-2003  . RIGHT URETEROSCOPIC STONE EXTRACTION  05-29-2003  . TRANSTHORACIC ECHOCARDIOGRAM  12-19-2008   LVSF NORMAL/ EF 42-70%/ GRADE I DIASTOLIC DYSFUNCTION/   . TRANSURETHRAL RESECTION OF BLADDER  2004  .  URETEROSCOPY  06/15/2011   Procedure: URETEROSCOPY;  Surgeon: Franchot Gallo, MD;  Location: [facility];  Service: Urology;  Laterality: Bilateral;       Family History  Problem Relation Age of Onset  . Hypertension Mother   . Stroke Father   . Hypertension Sister   . Hypertension Brother     Social History   Tobacco Use  . Smoking status: Former Smoker    Years: 40.00    Types: Cigarettes    Quit date: 06/10/1990    Years since quitting: 29.3  . Smokeless tobacco: Never  Used  Substance Use Topics  . Alcohol use: No  . Drug use: No    Home Medications Prior to Admission medications   Medication Sig Start Date End Date Taking? Authorizing Provider  ALPHAGAN P 0.1 % SOLN Place 1 drop into the left eye every 8 (eight) hours as needed (eye pressure).  09/25/19  Yes [provider]  ALPRAZolam Duanne Moron) 0.5 MG tablet Take 0.5 mg by mouth 2 (two) times daily as needed for anxiety.  10/01/19  Yes [provider]  amLODipine (NORVASC) 10 MG tablet Take 1 tablet (10 mg total) by mouth daily. 03/16/17  Yes Jaynee Eagles, PA-C  atenolol (TENORMIN) 50 MG tablet Take 1 tablet (50 mg total) by mouth daily. Pt needs appointment for further refills 10/22/17  Yes Stallings, Zoe A, MD  HYDROcodone-acetaminophen (NORCO) 10-325 MG per tablet Take 1 tablet by mouth 4 (four) times daily as needed for moderate pain or severe pain.  02/12/14  Yes [provider]  nitrofurantoin, macrocrystal-monohydrate, (MACROBID) 100 MG capsule Take 1 capsule (100 mg total) by mouth 2 (two) times daily for 7 days. 10/19/19 10/26/19 Yes Gaylan Gerold, DO  ROCKLATAN 0.02-0.005 % SOLN Apply 1 drop to eye at bedtime. 09/24/19  Yes [provider]  timolol (TIMOPTIC) 0.5 % ophthalmic solution Place 1 drop into both eyes 2 (two) times daily as needed (pressure).  01/11/17  Yes [provider]  ALPRAZolam (XANAX) 0.25 MG tablet Take 1 tablet (0.25 mg total) by mouth at bedtime as needed for anxiety. Patient not taking: Reported on 10/25/2019 12/27/17   Ripley Fraise, MD  aspirin 81 MG EC tablet Take 1 tablet (81 mg total) by mouth daily. Patient not taking: Reported on 12/27/2017 06/29/17   Dessa Phi, DO    Allergies    Tape, Doxycycline hyclate, and Sulfa antibiotics  Review of Systems   Review of Systems  Constitutional: Positive for appetite change, chills and fever.  HENT: Negative for congestion.   Respiratory: Negative for cough and shortness of breath.    Gastrointestinal: Positive for constipation and diarrhea. Negative for abdominal distention, abdominal pain, blood in stool and vomiting.  Endocrine: Negative for polyuria.  Genitourinary: Positive for bladder incontinence, frequency and scrotal swelling. Negative for penile pain.  Musculoskeletal: Positive for back pain.  Neurological: Negative for dizziness and headaches.  All other systems reviewed and are negative.   Physical Exam Updated Vital Signs BP 138/76   Pulse (!) 111   Temp 98.9 F (37.2 C) (Oral)   Resp 18   Ht 6' (1.829 m)   Wt 93.9 kg   SpO2 96%   BMI 28.07 kg/m   Physical Exam Vitals reviewed. Exam conducted with a chaperone present.  Constitutional:      General: He is not in acute distress. HENT:     Head: Normocephalic and atraumatic.     Nose: Nose normal.  Mouth/Throat:     Mouth: Mucous membranes are moist.  Eyes:     Conjunctiva/sclera: Conjunctivae normal.  Cardiovascular:     Rate and Rhythm: Tachycardia present.     Heart sounds: Normal heart sounds.  Pulmonary:     Effort: Pulmonary effort is normal.     Breath sounds: Normal breath sounds.  Abdominal:     General: There is distension.     Palpations: Abdomen is soft.     Tenderness: There is no abdominal tenderness.  Genitourinary:    Comments: Enlarged erythematous scrotum, non tender, non dusky appearing Musculoskeletal:     Cervical back: Neck supple. No tenderness.     Right lower leg: No edema.     Left lower leg: No edema.  Skin:    General: Skin is warm and dry.  Neurological:     Mental Status: He is alert.  Psychiatric:        Mood and Affect: Mood normal.        Behavior: Behavior normal.        ED Results / Procedures / Treatments   Labs (all labs ordered are listed, but only abnormal results are displayed) Labs Reviewed  COMPREHENSIVE METABOLIC PANEL - Abnormal; Notable for the following components:      Result Value   Glucose, Bld 162 (*)     Creatinine, Ser 2.49 (*)    Albumin 2.9 (*)    GFR, Estimated 26 (*)    All other components within normal limits  CBC - Abnormal; Notable for the following components:   WBC 21.7 (*)    RBC 3.26 (*)    Hemoglobin 10.6 (*)    HCT 34.4 (*)    MCV 105.5 (*)    All other components within normal limits  URINALYSIS, ROUTINE W REFLEX MICROSCOPIC - Abnormal; Notable for the following components:   APPearance HAZY (*)    Hgb urine dipstick LARGE (*)    Ketones, ur 5 (*)    Protein, ur 100 (*)    Nitrite POSITIVE (*)    Leukocytes,Ua SMALL (*)    RBC / HPF >50 (*)    Bacteria, UA RARE (*)    All other components within normal limits  LACTIC ACID, PLASMA - Abnormal; Notable for the following components:   Lactic Acid, Venous 2.1 (*)    All other components within normal limits  PROTIME-INR - Abnormal; Notable for the following components:   Prothrombin Time 16.3 (*)    INR 1.4 (*)    All other components within normal limits  APTT - Abnormal; Notable for the following components:   aPTT 38 (*)    All other components within normal limits  RESPIRATORY PANEL BY RT PCR (FLU A&B, COVID)  CULTURE, BLOOD (ROUTINE X 2)  CULTURE, BLOOD (ROUTINE X 2)  URINE CULTURE  LIPASE, BLOOD  LACTIC ACID, PLASMA  LACTIC ACID, PLASMA    EKG EKG Interpretation  Date/Time:  Friday October 25 2019 15:10:31 EDT Ventricular Rate:  116 PR Interval:    QRS Duration: 75 QT Interval:  335 QTC Calculation: 466 R Axis:   32 Text Interpretation: Sinus tachycardia Nonspecific ST abnormality Confirmed by Lajean Saver [phone]) on 10/25/2019 3:12:41 PM   Radiology CT ABDOMEN PELVIS WO CONTRAST  Result Date: 10/25/2019 CLINICAL DATA:  Sepsis EXAM: CT ABDOMEN AND PELVIS WITHOUT CONTRAST TECHNIQUE: Multidetector CT imaging of the abdomen and pelvis was performed following the standard protocol without IV contrast. COMPARISON:  10/19/2019 and 12/25/2017 FINDINGS: Lower  chest: The lung bases are clear.  Hepatobiliary: Multiple low-attenuation lesions throughout the liver, unchanged since prior studies. Likely cysts. Gallbladder and bile ducts are normal. Pancreas: Unremarkable. No pancreatic ductal dilatation or surrounding inflammatory changes. Spleen: Normal in size without focal abnormality. Adrenals/Urinary Tract: No adrenal gland nodules. Right renal atrophy. Large stones in the right renal pelvis measuring 10 and 14 mm in diameter. No change since prior study. Mild right hydronephrosis and hydroureter. No ureteral stones are demonstrated. Ureteral dilatation is new since the previous study. This may indicate reflux or pyelonephritis. A non radiopaque obstructing stone could also be present. The bladder is decompressed with a Foley catheter but there appears to be diffuse bladder wall thickening possibly due to cystitis or outlet obstruction. Mild stranding in the pelvis, likely inflammatory. Stomach/Bowel: Stomach is within normal limits. Appendix appears normal. No evidence of bowel wall thickening, distention, or inflammatory changes. Vascular/Lymphatic: Aortic atherosclerosis. No enlarged abdominal or pelvic lymph nodes. Reproductive: Prostate gland is diffusely enlarged with seed implants present. Low-attenuation region in the left prostate is unchanged since prior study. Possibly mass or abscess. No significant pelvic lymphadenopathy. Other: No free air or free fluid in the abdomen. Moderate-sized periumbilical hernia containing fat. Small right inguinal hernia containing fat. Musculoskeletal: Degenerative changes in the spine and hips. IMPRESSION: 1. Large stones in the right renal pelvis with mild right hydronephrosis and hydroureter. No ureteral stones are demonstrated. This may indicate reflux or pyelonephritis. A non radiopaque obstructing stone could also be present. 2. The bladder is decompressed with a Foley catheter but there appears to be diffuse bladder wall thickening possibly due to cystitis  or outlet obstruction. Mild stranding in the pelvis, likely inflammatory. 3. Prostate gland is diffusely enlarged with seed implants present. Low-attenuation region in the left prostate gland is unchanged since prior study. Possibly mass or abscess. 4. Moderate-sized periumbilical hernia containing fat. Small right inguinal hernia containing fat. Aortic Atherosclerosis (ICD10-I70.0). Electronically Signed   By: Lucienne Capers M.D.   On: 10/25/2019 19:55   DG Chest Portable 1 View  Result Date: 10/25/2019 CLINICAL DATA:  Fever urinary retention EXAM: PORTABLE CHEST 1 VIEW COMPARISON:  12/27/2017 FINDINGS: The heart size and mediastinal contours are within normal limits. Both lungs are clear. The visualized skeletal structures are unremarkable. IMPRESSION: No active disease. Electronically Signed   By: Donavan Foil M.D.   On: 10/25/2019 17:38    Procedures Procedures (including critical care time)  Medications Ordered in ED Medications  lactated ringers infusion ( Intravenous New Bag/Given 10/25/19 2047)  acetaminophen (TYLENOL) tablet 650 mg (has no administration in time range)  ceFEPIme (MAXIPIME) 2 g in sodium chloride 0.9 % 100 mL IVPB (has no administration in time range)  lactated ringers bolus 500 mL (0 mLs Intravenous Stopped 10/25/19 1902)  lactated ringers bolus 500 mL (0 mLs Intravenous Stopped 10/25/19 1902)  morphine 2 MG/ML injection 2 mg (2 mg Intravenous Given 10/25/19 1931)  lactated ringers bolus 1,000 mL (1,000 mLs Intravenous New Bag/Given 10/25/19 2005)  acetaminophen (TYLENOL) tablet 1,000 mg (1,000 mg Oral Given 10/25/19 2037)    ED Course  I have reviewed the triage vital signs and the nursing notes.  Pertinent labs & imaging results that were available during my care of the patient were reviewed by me and considered in my medical decision making (see chart for details).  Clinical Course as of Oct 24 2301  Fri Oct 25, 2019  2235 EKG 12-Lead [SL]    Clinical  Course User Index [  SL] Roosevelt Locks, MD   MDM Rules/Calculators/A&P                           Medical Decision Making: KAHLI FITZGERALD is a 79 y.o. male who presented to the ED today with urinary retention and constipation/diarrhea and fever and chills.   Pt reports he is unsure of his last void as he leaks urine into his diaper constantly. Pt reports mix of diarrhea and urine over last few days. Bladder scan in triage is 255 ml. Pt also reporting chills and subjective fever for 3 days. Pt diagnosed with UTI last week, reports he has been taking his abx and getting worse and not better.    Past medical history significant for HTN, TIA, HLD, Hx kidney stones (with stent placement), prostate cancer (s/p radiation), and urinary incontinence  Reviewed and confirmed nursing documentation for past medical history, family history, social history.  On my initial exam, the pt was in NAD, soft non tender abdomen. Tachycardic but Hemodynamically stable.  Pt tachycardic with leukocytosis, subjective fever at home. Concern for infection, likely urinary given recent UTI 10/16, will get UA and Ucx.  CXR unremarkable, no pneumonia.  Difficulty in obtaining urine sample, attempted catheter by nursing and attending in multiple sizes, urology consulted for foley placement.   UA with small leuks and positive nitrites.   Concern for sepsis, blood cultures obtained, lactic, empiric abx (rocephin) and 30  fluids given.  CT abdomen with Large stones in the right renal pelvis with mild right hydronephrosis and hydroureter may reflect pyelonephritis. Prostate enlarged with seeds present, possible mass or abscess. Periumbilical hernia, fat containing.   All radiology and laboratory studies reviewed independently and with my attending physician, agree with reading provided by radiologist unless otherwise noted.  Upon reassessing patient, patient was resting comfortably, Wife at bedside. Discussed need for  admission based on urosepsis.  Febrile to 103, given tylenol.   Based on the above findings, I believe patient requires admission.    The above care was discussed with and agreed upon by my attending physician.   Emergency Department Medication Summary:  Medications  lactated ringers infusion ( Intravenous New Bag/Given 10/25/19 2047)  acetaminophen (TYLENOL) tablet 650 mg (has no administration in time range)  ceFEPIme (MAXIPIME) 2 g in sodium chloride 0.9 % 100 mL IVPB (has no administration in time range)  lactated ringers bolus 500 mL (0 mLs Intravenous Stopped 10/25/19 1902)  lactated ringers bolus 500 mL (0 mLs Intravenous Stopped 10/25/19 1902)  morphine 2 MG/ML injection 2 mg (2 mg Intravenous Given 10/25/19 1931)  lactated ringers bolus 1,000 mL (1,000 mLs Intravenous New Bag/Given 10/25/19 2005)  acetaminophen (TYLENOL) tablet 1,000 mg (1,000 mg Oral Given 10/25/19 2037)        Final Clinical Impression(s) / ED Diagnoses Final diagnoses:  Sepsis secondary to UTI [facility])    Rx / Marina Orders ED Discharge Orders    None       Roosevelt Locks, MD 10/25/19 2303    Lajean Saver, MD 10/26/19 1549

## 2019-10-26 ENCOUNTER — Encounter (HOSPITAL_COMMUNITY): Payer: Self-pay | Admitting: Internal Medicine

## 2019-10-26 DIAGNOSIS — A419 Sepsis, unspecified organism: Principal | ICD-10-CM

## 2019-10-26 DIAGNOSIS — I10 Essential (primary) hypertension: Secondary | ICD-10-CM

## 2019-10-26 DIAGNOSIS — N179 Acute kidney failure, unspecified: Secondary | ICD-10-CM | POA: Diagnosis not present

## 2019-10-26 DIAGNOSIS — N12 Tubulo-interstitial nephritis, not specified as acute or chronic: Secondary | ICD-10-CM

## 2019-10-26 DIAGNOSIS — R338 Other retention of urine: Secondary | ICD-10-CM | POA: Diagnosis not present

## 2019-10-26 DIAGNOSIS — N390 Urinary tract infection, site not specified: Secondary | ICD-10-CM

## 2019-10-26 DIAGNOSIS — N183 Chronic kidney disease, stage 3 unspecified: Secondary | ICD-10-CM

## 2019-10-26 DIAGNOSIS — D539 Nutritional anemia, unspecified: Secondary | ICD-10-CM

## 2019-10-26 DIAGNOSIS — E872 Acidosis, unspecified: Secondary | ICD-10-CM | POA: Diagnosis present

## 2019-10-26 DIAGNOSIS — Z8546 Personal history of malignant neoplasm of prostate: Secondary | ICD-10-CM

## 2019-10-26 LAB — CBC WITH DIFFERENTIAL/PLATELET
Abs Immature Granulocytes: 0.31 10*3/uL — ABNORMAL HIGH (ref 0.00–0.07)
Basophils Absolute: 0 10*3/uL (ref 0.0–0.1)
Basophils Relative: 0 %
Eosinophils Absolute: 0 10*3/uL (ref 0.0–0.5)
Eosinophils Relative: 0 %
HCT: 28.5 % — ABNORMAL LOW (ref 39.0–52.0)
Hemoglobin: 8.7 g/dL — ABNORMAL LOW (ref 13.0–17.0)
Immature Granulocytes: 2 %
Lymphocytes Relative: 3 %
Lymphs Abs: 0.4 10*3/uL — ABNORMAL LOW (ref 0.7–4.0)
MCH: 32.5 pg (ref 26.0–34.0)
MCHC: 30.5 g/dL (ref 30.0–36.0)
MCV: 106.3 fL — ABNORMAL HIGH (ref 80.0–100.0)
Monocytes Absolute: 1.9 10*3/uL — ABNORMAL HIGH (ref 0.1–1.0)
Monocytes Relative: 13 %
Neutro Abs: 11.8 10*3/uL — ABNORMAL HIGH (ref 1.7–7.7)
Neutrophils Relative %: 82 %
Platelets: 174 10*3/uL (ref 150–400)
RBC: 2.68 MIL/uL — ABNORMAL LOW (ref 4.22–5.81)
RDW: 12.9 % (ref 11.5–15.5)
WBC: 14.5 10*3/uL — ABNORMAL HIGH (ref 4.0–10.5)
nRBC: 0 % (ref 0.0–0.2)

## 2019-10-26 LAB — CORTISOL-AM, BLOOD: Cortisol - AM: 13.3 ug/dL (ref 6.7–22.6)

## 2019-10-26 LAB — HEMOGLOBIN AND HEMATOCRIT, BLOOD
HCT: 32.1 % — ABNORMAL LOW (ref 39.0–52.0)
Hemoglobin: 9.8 g/dL — ABNORMAL LOW (ref 13.0–17.0)

## 2019-10-26 LAB — PROTIME-INR
INR: 1.4 — ABNORMAL HIGH (ref 0.8–1.2)
Prothrombin Time: 16.5 seconds — ABNORMAL HIGH (ref 11.4–15.2)

## 2019-10-26 LAB — COMPREHENSIVE METABOLIC PANEL
ALT: 20 U/L (ref 0–44)
AST: 28 U/L (ref 15–41)
Albumin: 2.2 g/dL — ABNORMAL LOW (ref 3.5–5.0)
Alkaline Phosphatase: 66 U/L (ref 38–126)
Anion gap: 10 (ref 5–15)
BUN: 20 mg/dL (ref 8–23)
CO2: 27 mmol/L (ref 22–32)
Calcium: 8.5 mg/dL — ABNORMAL LOW (ref 8.9–10.3)
Chloride: 102 mmol/L (ref 98–111)
Creatinine, Ser: 2.1 mg/dL — ABNORMAL HIGH (ref 0.61–1.24)
GFR, Estimated: 31 mL/min — ABNORMAL LOW (ref >60)
Glucose, Bld: 127 mg/dL — ABNORMAL HIGH (ref 70–99)
Potassium: 3.9 mmol/L (ref 3.5–5.1)
Sodium: 139 mmol/L (ref 135–145)
Total Bilirubin: 0.7 mg/dL (ref 0.3–1.2)
Total Protein: 6 g/dL — ABNORMAL LOW (ref 6.5–8.1)

## 2019-10-26 LAB — IRON AND TIBC
Iron: 28 ug/dL — ABNORMAL LOW (ref 45–182)
Saturation Ratios: 22 % (ref 17.9–39.5)
TIBC: 125 ug/dL — ABNORMAL LOW (ref 250–450)
UIBC: 97 ug/dL

## 2019-10-26 LAB — FOLATE: Folate: 16.9 ng/mL (ref >5.9)

## 2019-10-26 LAB — PROCALCITONIN: Procalcitonin: 9.47 ng/mL

## 2019-10-26 LAB — MAGNESIUM: Magnesium: 2.6 mg/dL — ABNORMAL HIGH (ref 1.7–2.4)

## 2019-10-26 LAB — LACTIC ACID, PLASMA
Lactic Acid, Venous: 1 mmol/L (ref 0.5–1.9)
Lactic Acid, Venous: 0.9 mmol/L (ref 0.5–1.9)

## 2019-10-26 LAB — VITAMIN B12: Vitamin B-12: 1012 pg/mL — ABNORMAL HIGH (ref 180–914)

## 2019-10-26 MED ORDER — ACETAMINOPHEN 325 MG PO TABS
650.0000 mg | ORAL_TABLET | Freq: Four times a day (QID) | ORAL | Status: DC | PRN
  Administered 2019-10-26 – 2019-11-02 (×11): 650 mg via ORAL
  Filled 2019-10-26 (×13): qty 2

## 2019-10-26 MED ORDER — ALPRAZOLAM 0.5 MG PO TABS
0.5000 mg | ORAL_TABLET | Freq: Two times a day (BID) | ORAL | Status: DC | PRN
  Administered 2019-10-27 – 2019-11-02 (×10): 0.5 mg via ORAL
  Filled 2019-10-26 (×11): qty 1

## 2019-10-26 MED ORDER — ONDANSETRON HCL 4 MG PO TABS: 4.0000 mg | ORAL_TABLET | Freq: Four times a day (QID) | ORAL | Status: DC | PRN

## 2019-10-26 MED ORDER — SENNA 8.6 MG PO TABS
1.0000 | ORAL_TABLET | Freq: Two times a day (BID) | ORAL | Status: DC | PRN
  Administered 2019-10-26 – 2019-10-28 (×4): 8.6 mg via ORAL
  Filled 2019-10-26 (×4): qty 1

## 2019-10-26 MED ORDER — HYDROCODONE-ACETAMINOPHEN 10-325 MG PO TABS
1.0000 | ORAL_TABLET | Freq: Four times a day (QID) | ORAL | Status: DC | PRN
  Administered 2019-10-26 – 2019-11-03 (×26): 1 via ORAL
  Filled 2019-10-26 (×28): qty 1

## 2019-10-26 MED ORDER — TIMOLOL MALEATE 0.5 % OP SOLN
1.0000 [drp] | Freq: Two times a day (BID) | OPHTHALMIC | Status: DC
  Administered 2019-10-26 – 2019-11-03 (×17): 1 [drp] via OPHTHALMIC
  Filled 2019-10-26 (×5): qty 5

## 2019-10-26 MED ORDER — CHLORHEXIDINE GLUCONATE CLOTH 2 % EX PADS
6.0000 | MEDICATED_PAD | Freq: Every day | CUTANEOUS | Status: DC
  Administered 2019-10-26 – 2019-11-03 (×9): 6 via TOPICAL

## 2019-10-26 MED ORDER — LACTATED RINGERS IV SOLN: INTRAVENOUS | Status: AC

## 2019-10-26 MED ORDER — ENOXAPARIN SODIUM 30 MG/0.3ML ~~LOC~~ SOLN
30.0000 mg | Freq: Every day | SUBCUTANEOUS | Status: DC
  Administered 2019-10-26: 30 mg via SUBCUTANEOUS
  Filled 2019-10-26: qty 0.3

## 2019-10-26 MED ORDER — ONDANSETRON HCL 4 MG/2ML IJ SOLN: 4.0000 mg | Freq: Four times a day (QID) | INTRAMUSCULAR | Status: DC | PRN

## 2019-10-26 MED ORDER — POLYETHYLENE GLYCOL 3350 17 G PO PACK
17.0000 g | PACK | Freq: Every day | ORAL | Status: DC | PRN
  Administered 2019-10-26: 17 g via ORAL
  Filled 2019-10-26: qty 1

## 2019-10-26 MED ORDER — BRIMONIDINE TARTRATE 0.2 % OP SOLN
1.0000 [drp] | Freq: Three times a day (TID) | OPHTHALMIC | Status: DC | PRN
  Administered 2019-10-28 – 2019-11-01 (×3): 1 [drp] via OPHTHALMIC
  Filled 2019-10-26 (×2): qty 5

## 2019-10-26 MED ORDER — SODIUM CHLORIDE 0.9 % IV SOLN
INTRAVENOUS | Status: DC | PRN
  Administered 2019-10-26: 250 mL via INTRAVENOUS

## 2019-10-26 MED ORDER — ATENOLOL 50 MG PO TABS
50.0000 mg | ORAL_TABLET | Freq: Every day | ORAL | Status: DC
  Administered 2019-10-26 – 2019-11-03 (×9): 50 mg via ORAL
  Filled 2019-10-26 (×9): qty 1

## 2019-10-26 MED ORDER — ACETAMINOPHEN 650 MG RE SUPP: 650.0000 mg | Freq: Four times a day (QID) | RECTAL | Status: DC | PRN

## 2019-10-26 MED ORDER — AMLODIPINE BESYLATE 10 MG PO TABS
10.0000 mg | ORAL_TABLET | Freq: Every day | ORAL | Status: DC
  Administered 2019-10-26 – 2019-11-03 (×9): 10 mg via ORAL
  Filled 2019-10-26 (×4): qty 1
  Filled 2019-10-26: qty 2
  Filled 2019-10-26 (×4): qty 1

## 2019-10-26 NOTE — ED Notes (Signed)
Breakfast ordered 

## 2019-10-26 NOTE — ED Notes (Signed)
Visitor at bedside.

## 2019-10-26 NOTE — H&P (Signed)
History and Physical    [name] JGG:[id number] DOB: 18-Jul-1940 DOA: 10/25/2019  PCP: Patient, No Pcp Per  Patient coming from:      Chief Complaint:  Chief Complaint  Patient presents with  . Urinary Retention  . Constipation     HPI:    79 year old male with past medical history of hypertension, chronic kidney disease stage III, anxiety disorder, chronic pain syndrome, prostate cancer (Stage IIB, Dx 2014 S/P radiation therapy) who presents to [facility] emergency department due to inability to urinate.  Patient explains that for approximately past 3 days he has had progressively increasing difficulty with urination.  This is been associated with complaints of lower abdominal pain, waxing and waning in quality, mild to moderate in intensity and associated with attempts to urinate.  Patient states that he has strain so hard to urinate that he exhibits loose stools.  He has been unsuccessful in urinating consistently for the past several days.  Upon further questioning patient complains of associated generalized weakness, poor appetite, nausea and occasional vomiting.  Patient is also complaining of worsening lower abdominal pain over the span of time.  Patient denies hematuria, fevers, sick contacts, recent travel or confirmed contact with COVID-19 infection.  Patient's symptoms continue to worsen until he eventually presented to [facility] emergency department for evaluation.  Upon evaluation in the emergency department patient was found to be suffering from urinary retention.  Multiple attempts to place a Foley catheter were made and failed.  Dr. Lovena Neighbours with urology was consulted who placed a urinary catheter successfully and recommended that it remain in place for at least 1 week.  It was also recommended that patient have CT imaging of the abdomen and pelvis.  CT imaging of the abdomen and pelvis revealed right-sided hydronephrosis and hydroureter with concerns  for pyelonephritis.  Additionally, prostate gland was noted to be diffusely enlarged with a low-attenuation region in the left prostate gland concerning for possible mass or abscess.  Patient was initiated on intravenous ceftriaxone, intravenous fluids were initiated and the hospitalist group was then called to assess the patient for mission the hospital.  Review of Systems:   Review of Systems  Constitutional: Positive for malaise/fatigue.  Gastrointestinal: Positive for abdominal pain.  Genitourinary: Positive for dysuria.       Inability to urinate  All other systems reviewed and are negative.   Past Medical History:  Diagnosis Date  . Anxiety   . Arthritis   . Bilateral flank pain    DUE TO KIDNEY STONES  . Chronic back pain   . Dyslipidemia   . Eczema   . Elevated PSA 01/11/2012   25.77  . Frequency of urination   . Glaucoma PT STATES RX RAN OUT  . Glaucoma   . Hematuria   . Herniated disc   . History of kidney stones   . History of radiation therapy 06/07/12-08/02/12   prostate, 7800 cGy/40 sessions/ 5600cGy pelvic lymph nodes/40 sessions  . History of shingles   . History of TIA (transient ischemic attack) 2010--  NO RESIDUAL   NONE SINCE  . Hypertension   . Nocturia   . Prostate cancer ([place]) 03/2012  . Renal calculi BILATERAL   . Urgency of urination   . Weakness of both legs     Past Surgical History:  Procedure Laterality Date  . BILIARY STENT PLACEMENT  06/15/2011   Procedure: BILIARY STENT PLACEMENT;  Surgeon: Franchot Gallo, MD;  Location: [facility];  Service: Urology;  Laterality: Bilateral;  . CYSTO/ BILATERAL RETROGRADE PYELOGRAM/ LEFT URETERSCOPIC STONE EXTRACTION  11-14-2005   LEFT URETERAL CALCULUS  . CYSTO/ BILATERAL URETERAL STENTS  11-06-2005  . CYSTO/ LEFT RETROGRADE PYELOGRAM/  LEFT STENT PLACMENT  05-21-2011  . CYSTOSCOPY W/ URETERAL STENT REMOVAL  06/15/2011   Procedure: CYSTOSCOPY WITH STENT REMOVAL;  Surgeon: Franchot Gallo, MD;  Location: [facility];  Service: Urology;  Laterality: Left;  . EXTRACORPOREAL SHOCK WAVE LITHOTRIPSY    . INTERNAL URETHROTOMY/ TURP  12-23-2002   BPH W/ STRICTURE  . PERCUTANEOUS NEPHROLITHOTOMY  12-10-2007  &  11-07-2005   RIGHT RENAL CALCULI  . REPAIR LEFT INGUINAL HERNIA W/ MESH  12-23-2002  . RIGHT URETERAL STENT PLACEMENT  02-10-2003  . RIGHT URETEROSCOPIC STONE EXTRACTION  05-29-2003  . TRANSTHORACIC ECHOCARDIOGRAM  12-19-2008   LVSF NORMAL/ EF 00-86%/ GRADE I DIASTOLIC DYSFUNCTION/   . TRANSURETHRAL RESECTION OF BLADDER  2004  . URETEROSCOPY  06/15/2011   Procedure: URETEROSCOPY;  Surgeon: Franchot Gallo, MD;  Location: [facility];  Service: Urology;  Laterality: Bilateral;     reports that he quit smoking about 29 years ago. His smoking use included cigarettes. He quit after 40.00 years of use. He has never used smokeless tobacco. He reports that he does not drink alcohol and does not use drugs.  Allergies  Allergen Reactions  . Tape Other (See Comments)    Redness and blisters/paper tape-not latex  . Doxycycline Hyclate Other (See Comments)    Mental changes   . Sulfa Antibiotics Hives    Family History  Problem Relation Age of Onset  . Hypertension Mother   . Stroke Father   . Hypertension Sister   . Hypertension Brother      Prior to Admission medications   Medication Sig Start Date End Date Taking? Authorizing Provider  ALPHAGAN P 0.1 % SOLN Place 1 drop into the left eye every 8 (eight) hours as needed (eye pressure).  09/25/19  Yes [provider]  ALPRAZolam Duanne Moron) 0.5 MG tablet Take 0.5 mg by mouth 2 (two) times daily as needed for anxiety.  10/01/19  Yes [provider]  amLODipine (NORVASC) 10 MG tablet Take 1 tablet (10 mg total) by mouth daily. 03/16/17  Yes Jaynee Eagles, PA-C  atenolol (TENORMIN) 50 MG tablet Take 1 tablet (50 mg total) by mouth daily. Pt needs appointment for further  refills 10/22/17  Yes Stallings, Zoe A, MD  HYDROcodone-acetaminophen (NORCO) 10-325 MG per tablet Take 1 tablet by mouth 4 (four) times daily as needed for moderate pain or severe pain.  02/12/14  Yes [provider]  nitrofurantoin, macrocrystal-monohydrate, (MACROBID) 100 MG capsule Take 1 capsule (100 mg total) by mouth 2 (two) times daily for 7 days. 10/19/19 10/26/19 Yes Gaylan Gerold, DO  ROCKLATAN 0.02-0.005 % SOLN Apply 1 drop to eye at bedtime. 09/24/19  Yes [provider]  timolol (TIMOPTIC) 0.5 % ophthalmic solution Place 1 drop into both eyes 2 (two) times daily as needed (pressure).  01/11/17  Yes [provider]  ALPRAZolam (XANAX) 0.25 MG tablet Take 1 tablet (0.25 mg total) by mouth at bedtime as needed for anxiety. Patient not taking: Reported on 10/25/2019 12/27/17   Ripley Fraise, MD  aspirin 81 MG EC tablet Take 1 tablet (81 mg total) by mouth daily. Patient not taking: Reported on 12/27/2017 06/29/17   Dessa Phi, DO    Physical Exam: Vitals:   10/25/19 1730 10/25/19 1958 10/25/19 2245  10/25/19 2252  BP: (!) 132/114 (!) 152/111 138/76   Pulse: (!) 111 (!) 152 (!) 111   Resp: 18 20 18    Temp:  (!) 103.1 F (39.5 C)  98.9 F (37.2 C)  TempSrc:  Oral  Oral  SpO2: 91% 94% 96%   Weight:      Height:        Constitutional: Lethargic but arousable and oriented x3, no associated distress.   Skin: no rashes, no lesions, poor skin turgor noted. Eyes: Pupils are equally reactive to light.  No evidence of scleral icterus or conjunctival pallor.  ENMT: Dry mucous membranes noted.  Posterior pharynx clear of any exudate or lesions.   Neck: normal, supple, no masses, no thyromegaly.  No evidence of jugular venous distension.   Respiratory: clear to auscultation bilaterally, no wheezing, no crackles. Normal respiratory effort. No accessory muscle use.  Cardiovascular: Tachycardic but regular, no murmurs / rubs / gallops. No extremity edema. 2+ pedal  pulses. No carotid bruits.  Chest:   Nontender without crepitus or deformity.   Back:   Nontender without crepitus or deformity. Abdomen: Notable lower abdominal tenderness, no evidence of intra-abdominal masses.  Positive bowel sounds noted in all quadrants.   GU: Foley catheter is in place draining dark cloudy urine. Musculoskeletal: No joint deformity upper and lower extremities. Good ROM, no contractures. Normal muscle tone.  Neurologic: CN 2-12 grossly intact. Sensation intact.  Patient moving all 4 extremities spontaneously.  Patient is following all commands.  Patient is responsive to verbal stimuli.   Psychiatric: Patient exhibits depressed mood and flat affect.  Patient seems to possess insight as to their current situation.     Labs on Admission: I have personally reviewed following labs and imaging studies -   CBC: Recent Labs  Lab 10/19/19 0732 10/25/19 1330  WBC 10.0 21.7*  NEUTROABS 8.6*  --   HGB 11.4* 10.6*  HCT 35.7* 34.4*  MCV 104.4* 105.5*  PLT 137* 119   Basic Metabolic Panel: Recent Labs  Lab 10/19/19 0732 10/25/19 1330  NA 137 135  K 3.6 4.0  CL 102 98  CO2 24 25  GLUCOSE 157* 162*  BUN 15 19  CREATININE 1.89* 2.49*  CALCIUM 9.3 9.1   GFR: Estimated Creatinine Clearance: 28.6 mL/min (A) (by C-G formula based on SCr of 2.49 mg/dL (H)). Liver Function Tests: Recent Labs  Lab 10/25/19 1330  AST 34  ALT 23  ALKPHOS 89  BILITOT 1.1  PROT 7.6  ALBUMIN 2.9*   Recent Labs  Lab 10/25/19 1330  LIPASE 32   No results for input(s): AMMONIA in the last 168 hours. Coagulation Profile: Recent Labs  Lab 10/25/19 2048  INR 1.4*   Cardiac Enzymes: No results for input(s): CKTOTAL, CKMB, CKMBINDEX, TROPONINI in the last 168 hours. BNP (last 3 results) No results for input(s): PROBNP in the last 8760 hours. HbA1C: No results for input(s): HGBA1C in the last 72 hours. CBG: No results for input(s): GLUCAP in the last 168 hours. Lipid Profile: No  results for input(s): CHOL, HDL, LDLCALC, TRIG, CHOLHDL, LDLDIRECT in the last 72 hours. Thyroid Function Tests: No results for input(s): TSH, T4TOTAL, FREET4, T3FREE, THYROIDAB in the last 72 hours. Anemia Panel: No results for input(s): VITAMINB12, FOLATE, FERRITIN, TIBC, IRON, RETICCTPCT in the last 72 hours. Urine analysis:    Component Value Date/Time   COLORURINE YELLOW 10/25/2019 1830   APPEARANCEUR HAZY (A) 10/25/2019 1830   LABSPEC 1.012 10/25/2019 1830   PHURINE 5.0  10/25/2019 [place] 10/25/2019 1830   HGBUR LARGE (A) 10/25/2019 1830   BILIRUBINUR NEGATIVE 10/25/2019 1830   KETONESUR 5 (A) 10/25/2019 1830   PROTEINUR 100 (A) 10/25/2019 1830   UROBILINOGEN 0.2 09/24/2014 1336   NITRITE POSITIVE (A) 10/25/2019 1830   LEUKOCYTESUR SMALL (A) 10/25/2019 1830    Radiological Exams on Admission - Personally Reviewed: CT ABDOMEN PELVIS WO CONTRAST  Result Date: 10/25/2019 CLINICAL DATA:  Sepsis EXAM: CT ABDOMEN AND PELVIS WITHOUT CONTRAST TECHNIQUE: Multidetector CT imaging of the abdomen and pelvis was performed following the standard protocol without IV contrast. COMPARISON:  10/19/2019 and 12/25/2017 FINDINGS: Lower chest: The lung bases are clear. Hepatobiliary: Multiple low-attenuation lesions throughout the liver, unchanged since prior studies. Likely cysts. Gallbladder and bile ducts are normal. Pancreas: Unremarkable. No pancreatic ductal dilatation or surrounding inflammatory changes. Spleen: Normal in size without focal abnormality. Adrenals/Urinary Tract: No adrenal gland nodules. Right renal atrophy. Large stones in the right renal pelvis measuring 10 and 14 mm in diameter. No change since prior study. Mild right hydronephrosis and hydroureter. No ureteral stones are demonstrated. Ureteral dilatation is new since the previous study. This may indicate reflux or pyelonephritis. A non radiopaque obstructing stone could also be present. The bladder is decompressed  with a Foley catheter but there appears to be diffuse bladder wall thickening possibly due to cystitis or outlet obstruction. Mild stranding in the pelvis, likely inflammatory. Stomach/Bowel: Stomach is within normal limits. Appendix appears normal. No evidence of bowel wall thickening, distention, or inflammatory changes. Vascular/Lymphatic: Aortic atherosclerosis. No enlarged abdominal or pelvic lymph nodes. Reproductive: Prostate gland is diffusely enlarged with seed implants present. Low-attenuation region in the left prostate is unchanged since prior study. Possibly mass or abscess. No significant pelvic lymphadenopathy. Other: No free air or free fluid in the abdomen. Moderate-sized periumbilical hernia containing fat. Small right inguinal hernia containing fat. Musculoskeletal: Degenerative changes in the spine and hips. IMPRESSION: 1. Large stones in the right renal pelvis with mild right hydronephrosis and hydroureter. No ureteral stones are demonstrated. This may indicate reflux or pyelonephritis. A non radiopaque obstructing stone could also be present. 2. The bladder is decompressed with a Foley catheter but there appears to be diffuse bladder wall thickening possibly due to cystitis or outlet obstruction. Mild stranding in the pelvis, likely inflammatory. 3. Prostate gland is diffusely enlarged with seed implants present. Low-attenuation region in the left prostate gland is unchanged since prior study. Possibly mass or abscess. 4. Moderate-sized periumbilical hernia containing fat. Small right inguinal hernia containing fat. Aortic Atherosclerosis (ICD10-I70.0). Electronically Signed   By: Lucienne Capers M.D.   On: 10/25/2019 19:55   DG Chest Portable 1 View  Result Date: 10/25/2019 CLINICAL DATA:  Fever urinary retention EXAM: PORTABLE CHEST 1 VIEW COMPARISON:  12/27/2017 FINDINGS: The heart size and mediastinal contours are within normal limits. Both lungs are clear. The visualized skeletal  structures are unremarkable. IMPRESSION: No active disease. Electronically Signed   By: Donavan Foil M.D.   On: 10/25/2019 17:38    EKG: Personally reviewed.  Rhythm is sinus tachycardia with heart rate of 116 bpm.  No dynamic ST segment changes appreciated.  Assessment/Plan Principal Problem:   Sepsis secondary to UTI [facility])   Patient presenting with multiple sirs criteria in the setting of lactic acidosis and acute kidney injury all thought to be secondary from right-sided pyelonephritis and possible prostatitis with prostatic abscess with secondary sepsis.  Patient has been initiated on intravenous ceftriaxone in the emergency  department which I  will transition to intravenous cefepime until urine cultures return  Foley catheter has been graciously placed by urology which should hopefully assist in source control  There is diffuse swelling of the prostate with the possibility of an abscess but considering patient's known history of prostate malignancy I feel that recurrent prostate mass is just as likely -we will discuss further with urology throughout the hospitalization to help discern this.  Hydrating patient aggressively with intravenous isotonic fluids  Blood and urine cultures obtained  Monitoring closely for symptomatic improvement  Active Problems:   Complicated UTI (urinary tract infection)   Please see assessment and plan above  Sources of infection are suspected right-sided pyelonephritis as well as possible prostatitis and prostatic abscess  Urology has placed Foley, and is to continue to follow.    Acute renal failure superimposed on stage 3 chronic kidney disease ([place])   Patient is suffering from acute kidney injury likely secondary to postobstructive uropathy in addition to sepsis  Foley catheter has been placed by urology in the emergency department to relieve postobstructive uropathy  Hydrating patient with intravenous isotonic fluids, continuing  broad-spectrum intravenous antibiotic therapy  Strict input and output monitoring  Monitoring renal function electrolytes with serial chemistries    Pyelonephritis of right kidney   Please see assessment and plan above    Lactic acidosis   Lactic acidosis likely secondary to sepsis with concurrent volume depletion  Hydrate patient intravenous isotonic fluids while concurrently treating underlying infection  Monitoring serial lactic acid levels to ensure downtrending and resolution.    Acute urinary retention   Patient presenting with 3-day history of difficulty with urination  Emergency room staff was unsuccessful in placing Foley catheter requiring assistance of urology and DEXA  Per urology recommendations, Foley catheter will need to remain in place for a minimum of 1 week  Etiology is possibly prostatitis with prostatic abscess versus recurrence of prostate cancer with prostatic mass  Will seek further direction from urology throughout the hospitalization to help discern whether we are dealing with recurrent prostate malignancy versus infection.  Patient will certainly need outpatient follow-up with urology as well.    Essential hypertension, benign   Continue home regimen of antihypertensive therapy while monitoring for episodic hypotension in setting of sepsis    History of prostate cancer   Known history of prostate cancer, diagnosed in 2014 by Dr. Diona Fanti  Patient was treated with radiation therapy in 2014  When asking the patient, he denies any knowledge of having prostate cancer  CT revealing diffuse prostate enlargement with area of low-attenuation - findings could possibly be secondary to recurrence of malignancy.    Macrocytic anemia  No clinical evidence of bleeding  Monitoring hemoglobin and hematocrit with serial CBCs  Obtaining iron panel, folate, B12.    Code Status:  Full code Family Communication: deferred   Status is:  Inpatient  Remains inpatient appropriate because:IV treatments appropriate due to intensity of illness or inability to take PO and Inpatient level of care appropriate due to severity of illness   Dispo: The patient is from: Home              Anticipated d/c is to: Home              Anticipated d/c date is: > 3 days              Patient currently is not medically stable to d/c.        Sherryll Burger  Zari Cly MD [facility] Pager [phone]  If 7PM-7AM, please contact night-coverage [email or web address] Use universal [place] password for that web site. If you do not have the password, please call the hospital operator.  10/26/2019, 2:02 AM

## 2019-10-26 NOTE — ED Notes (Signed)
hospitalist at bedside

## 2019-10-26 NOTE — Care Plan (Signed)
This 79 years old Male with PMH of hypertension, CKD stage III, anxiety disorder, chronic pain syndrome, prostate cancer s/p radiation therapy presenting in the ED with inability to urinate.  Patient has progressively increasing difficulty with urination for last 3 days.  Multiple attempts to place Foley catheter in the ED were unsuccessful.  Urology was consulted and patient successfully had urinary catheter.  CT abdomen and pelvis revealed right-sided hydronephrosis and hydroureter with concerns for pyelonephritis.  Patient is started on IV fluids,  IV ceftriaxone and admitted for further evaluation. Antibiotics changed to cefepime. Patient was seen and examined,  seems comfortable.

## 2019-10-26 NOTE — Plan of Care (Signed)
  Problem: Education: Goal: Knowledge of General Education information will improve Description: Including pain rating scale, medication(s)/side effects and non-pharmacologic comfort measures Outcome: Progressing   Problem: Clinical Measurements: Goal: Diagnostic test results will improve Outcome: Progressing   Problem: Coping: Goal: Level of anxiety will decrease Outcome: Progressing   Problem: Elimination: Goal: Will not experience complications related to bowel motility Outcome: Progressing Goal: Will not experience complications related to urinary retention Outcome: Progressing   Problem: Pain Managment: Goal: General experience of comfort will improve Outcome: Progressing

## 2019-10-27 DIAGNOSIS — N390 Urinary tract infection, site not specified: Secondary | ICD-10-CM | POA: Diagnosis not present

## 2019-10-27 DIAGNOSIS — A419 Sepsis, unspecified organism: Secondary | ICD-10-CM | POA: Diagnosis not present

## 2019-10-27 LAB — COMPREHENSIVE METABOLIC PANEL
ALT: 28 U/L (ref 0–44)
AST: 40 U/L (ref 15–41)
Albumin: 2.2 g/dL — ABNORMAL LOW (ref 3.5–5.0)
Alkaline Phosphatase: 60 U/L (ref 38–126)
Anion gap: 11 (ref 5–15)
BUN: 15 mg/dL (ref 8–23)
CO2: 25 mmol/L (ref 22–32)
Calcium: 8.7 mg/dL — ABNORMAL LOW (ref 8.9–10.3)
Chloride: 102 mmol/L (ref 98–111)
Creatinine, Ser: 1.73 mg/dL — ABNORMAL HIGH (ref 0.61–1.24)
GFR, Estimated: 40 mL/min — ABNORMAL LOW (ref >60)
Glucose, Bld: 157 mg/dL — ABNORMAL HIGH (ref 70–99)
Potassium: 3.7 mmol/L (ref 3.5–5.1)
Sodium: 138 mmol/L (ref 135–145)
Total Bilirubin: 0.6 mg/dL (ref 0.3–1.2)
Total Protein: 6.4 g/dL — ABNORMAL LOW (ref 6.5–8.1)

## 2019-10-27 LAB — CBC
HCT: 28 % — ABNORMAL LOW (ref 39.0–52.0)
Hemoglobin: 8.8 g/dL — ABNORMAL LOW (ref 13.0–17.0)
MCH: 32.7 pg (ref 26.0–34.0)
MCHC: 31.4 g/dL (ref 30.0–36.0)
MCV: 104.1 fL — ABNORMAL HIGH (ref 80.0–100.0)
Platelets: 178 10*3/uL (ref 150–400)
RBC: 2.69 MIL/uL — ABNORMAL LOW (ref 4.22–5.81)
RDW: 12.8 % (ref 11.5–15.5)
WBC: 10.9 10*3/uL — ABNORMAL HIGH (ref 4.0–10.5)
nRBC: 0 % (ref 0.0–0.2)

## 2019-10-27 LAB — MAGNESIUM: Magnesium: 1.9 mg/dL (ref 1.7–2.4)

## 2019-10-27 LAB — PHOSPHORUS: Phosphorus: 2.2 mg/dL — ABNORMAL LOW (ref 2.5–4.6)

## 2019-10-27 MED ORDER — SODIUM CHLORIDE 0.9 % IV SOLN
INTRAVENOUS | Status: DC
Start: 2019-10-27 — End: 2019-10-27

## 2019-10-27 MED ORDER — POLYETHYLENE GLYCOL 3350 17 G PO PACK
17.0000 g | PACK | Freq: Every day | ORAL | Status: DC | PRN
  Administered 2019-10-27 – 2019-10-31 (×4): 17 g via ORAL
  Filled 2019-10-27 (×4): qty 1

## 2019-10-27 MED ORDER — SODIUM CHLORIDE 0.9 % IV SOLN: INTRAVENOUS | Status: AC

## 2019-10-27 MED ORDER — ENOXAPARIN SODIUM 40 MG/0.4ML ~~LOC~~ SOLN
40.0000 mg | Freq: Every day | SUBCUTANEOUS | Status: DC
  Administered 2019-10-27 – 2019-11-03 (×8): 40 mg via SUBCUTANEOUS
  Filled 2019-10-27 (×8): qty 0.4

## 2019-10-27 NOTE — Progress Notes (Signed)
10/27/19 01:00 Patient's BP 151/85 HR 116 T 100.3 O2 Sat 95% on RA, Yellow MEWS ,High sepsis score. Opyd,MD text paged. Will continue to monitor.

## 2019-10-27 NOTE — Plan of Care (Signed)
Problem: Pain Managment: Goal: General experience of comfort will improve Outcome: Completed/Met

## 2019-10-27 NOTE — Progress Notes (Signed)
Subjective: No acute events overnight.  The patient is primarily complaining of lower abdominal discomfort that he describes as an urge to have a bowel movement.  He did have a small bowel movement yesterday, but states that he has been dealing with constipation over the past several days.  He specifically denies flank pain, nausea/vomiting or chills.  Objective: Vital signs in last 24 hours: Temp:  [97.8 F (36.6 C)-100.5 F (38.1 C)] 97.8 F (36.6 C) (10/24 0849) Pulse Rate:  [95-121] 104 (10/24 0849) Resp:  [12-22] 19 (10/24 0849) BP: (118-157)/(75-109) 154/90 (10/24 0849) SpO2:  [94 %-100 %] 95 % (10/24 0849) Weight:  [87.4 kg-88.4 kg] 87.4 kg (10/23 2100)  Intake/Output from previous day: 10/23 0701 - 10/24 0700 In: 487.2 [P.O.:360; I.V.:27.2; IV Piggyback:100] Out: 4726 [Urine:4725; Stool:1]  Intake/Output this shift: No intake/output data recorded.  Physical Exam:  General: Alert and oriented CV: RRR, palpable distal pulses Lungs: CTAB, equal chest rise Abdomen: Soft, NTND, no rebound or guarding Gu: Foley catheter in place and draining clear-yellow urine Ext: NT, No erythema  Lab Results: Recent Labs    10/26/19 0631 10/26/19 0818 10/27/19 0248  HGB 8.7* 9.8* 8.8*  HCT 28.5* 32.1* 28.0*   BMET Recent Labs    10/26/19 0631 10/27/19 0248  NA 139 138  K 3.9 3.7  CL 102 102  CO2 27 25  GLUCOSE 127* 157*  BUN 20 15  CREATININE 2.10* 1.73*  CALCIUM 8.5* 8.7*     Studies/Results: CT ABDOMEN PELVIS WO CONTRAST  Result Date: 10/25/2019 CLINICAL DATA:  Sepsis EXAM: CT ABDOMEN AND PELVIS WITHOUT CONTRAST TECHNIQUE: Multidetector CT imaging of the abdomen and pelvis was performed following the standard protocol without IV contrast. COMPARISON:  10/19/2019 and 12/25/2017 FINDINGS: Lower chest: The lung bases are clear. Hepatobiliary: Multiple low-attenuation lesions throughout the liver, unchanged since prior studies. Likely cysts. Gallbladder and bile ducts  are normal. Pancreas: Unremarkable. No pancreatic ductal dilatation or surrounding inflammatory changes. Spleen: Normal in size without focal abnormality. Adrenals/Urinary Tract: No adrenal gland nodules. Right renal atrophy. Large stones in the right renal pelvis measuring 10 and 14 mm in diameter. No change since prior study. Mild right hydronephrosis and hydroureter. No ureteral stones are demonstrated. Ureteral dilatation is new since the previous study. This may indicate reflux or pyelonephritis. A non radiopaque obstructing stone could also be present. The bladder is decompressed with a Foley catheter but there appears to be diffuse bladder wall thickening possibly due to cystitis or outlet obstruction. Mild stranding in the pelvis, likely inflammatory. Stomach/Bowel: Stomach is within normal limits. Appendix appears normal. No evidence of bowel wall thickening, distention, or inflammatory changes. Vascular/Lymphatic: Aortic atherosclerosis. No enlarged abdominal or pelvic lymph nodes. Reproductive: Prostate gland is diffusely enlarged with seed implants present. Low-attenuation region in the left prostate is unchanged since prior study. Possibly mass or abscess. No significant pelvic lymphadenopathy. Other: No free air or free fluid in the abdomen. Moderate-sized periumbilical hernia containing fat. Small right inguinal hernia containing fat. Musculoskeletal: Degenerative changes in the spine and hips. IMPRESSION: 1. Large stones in the right renal pelvis with mild right hydronephrosis and hydroureter. No ureteral stones are demonstrated. This may indicate reflux or pyelonephritis. A non radiopaque obstructing stone could also be present. 2. The bladder is decompressed with a Foley catheter but there appears to be diffuse bladder wall thickening possibly due to cystitis or outlet obstruction. Mild stranding in the pelvis, likely inflammatory. 3. Prostate gland is diffusely enlarged with seed implants present.  Low-attenuation region in the left prostate gland is unchanged since prior study. Possibly mass or abscess. 4. Moderate-sized periumbilical hernia containing fat. Small right inguinal hernia containing fat. Aortic Atherosclerosis (ICD10-I70.0). Electronically Signed   By: Lucienne Capers M.D.   On: 10/25/2019 19:55   DG Chest Portable 1 View  Result Date: 10/25/2019 CLINICAL DATA:  Fever urinary retention EXAM: PORTABLE CHEST 1 VIEW COMPARISON:  12/27/2017 FINDINGS: The heart size and mediastinal contours are within normal limits. Both lungs are clear. The visualized skeletal structures are unremarkable. IMPRESSION: No active disease. Electronically Signed   By: Donavan Foil M.D.   On: 10/25/2019 17:38    Assessment/Plan: 1.  Urinary retention with possible urethral stricture: Keep Foley catheter in place for approximately 1 week.  Will plan for a voiding trial as an outpatient.  He will need to have a PSA check once his catheter is removed.  2.  Indeterminate periprostatic lesion/fluid collection.  History of prostate cancer: CT scans from 10/19/2019 and 10/25/2019 show a stable, indeterminate lesion adjacent to his prostate.  Unclear etiology.  He will need to have a PSA checked once his Foley catheter is removed.  PSA in 2020 was 0.09.   3.  Right staghorn calculus: His renal function and leukocytosis are progressively improving with IV fluid resuscitation and antibiotics, respectively.  He is not having any kidney stone related symptoms at this time.  Blood and urine cultures are negative to date.  He will likely need to have his large right renal stone addressed as an outpatient.    LOS: 2 days   Ellison Hughs, MD [facility] Pager: [phone]  10/27/2019, 8:57 AM

## 2019-10-27 NOTE — Plan of Care (Signed)
°  Problem: Education: Goal: Knowledge of General Education information will improve Description: Including pain rating scale, medication(s)/side effects and non-pharmacologic comfort measures Outcome: Progressing   Problem: Clinical Measurements: Goal: Diagnostic test results will improve Outcome: Progressing   Problem: Activity: Goal: Risk for activity intolerance will decrease Outcome: Progressing   Problem: Coping: Goal: Level of anxiety will decrease 10/27/2019 0741 by Dolores Hoose, RN Outcome: Progressing 10/26/2019 1815 by Dolores Hoose, RN Outcome: Progressing   Problem: Elimination: Goal: Will not experience complications related to bowel motility Outcome: Progressing   Problem: Elimination: Goal: Will not experience complications related to urinary retention Outcome: Progressing

## 2019-10-27 NOTE — Progress Notes (Signed)
PROGRESS NOTE    [name]  IZT:[id number] DOB: [birth year] DOA: 10/25/2019 PCP: Patient, No Pcp Per   Brief Narrative: This 79 years old Male with PMH of hypertension, CKD stage III, anxiety disorder, chronic pain syndrome, prostate cancer s/p radiation therapy presenting in the ED with inability to urinate.  Patient has progressively increasing difficulty with urination for last 3 days.  Multiple attempts to place Foley catheter in the ED were unsuccessful.  Urology was consulted and patient successfully had urinary catheter.  CT abdomen and pelvis revealed right-sided hydronephrosis and hydroureter with concerns for pyelonephritis.  Patient is started on IV fluids,  IV ceftriaxone and admitted for further evaluation. Antibiotics changed to cefepime. Urology recommended outpatient management , keep foley catheter for one week, voiding trial as an outpatient.  Assessment & Plan:   Principal Problem:   Sepsis secondary to UTI [facility]) Active Problems:   Essential hypertension, benign   Complicated UTI (urinary tract infection)   Acute renal failure superimposed on stage 3 chronic kidney disease (HCC)   Pyelonephritis of right kidney   History of prostate cancer   Lactic acidosis   Macrocytic anemia   Acute urinary retention  Sepsis secondary to UTI/ Pyelonephritis :   Patient presented with multiple sirs criteria in the setting of lactic acidosis and acute kidney injury all thought to be secondary from right-sided pyelonephritis and possible prostatitis with prostatic abscess with secondary sepsis.  Patient has been initiated on intravenous ceftriaxone in the emergency department which was changed to cefepime until urine cultures return  Foley catheter has been graciously placed by urology due to multiple failed attempts.  There is diffuse swelling of the prostate with the possibility of an abscess but considering patient's known history of prostate malignancy I feel that  recurrent prostate mass is just as likely -Urology is following  Hydrating patient aggressively with intravenous isotonic fluids  Blood  Culture no growth so far, urine culture pending  Monitoring closely for symptomatic improvement.       Urology  recommended outpatient management , keep foley catheter for one week, voiding trial as an outpatient.     Acute renal failure superimposed on stage 3 chronic kidney disease ([place])   Patient is suffering from acute kidney injury likely secondary to postobstructive uropathy in addition to sepsis.  Foley catheter has been placed by urology in the emergency department to relieve postobstructive uropathy.  Hydrating patient with intravenous isotonic fluids, continuing broad-spectrum intravenous antibiotic therapy.  Strict input and output monitoring  Monitoring renal function electrolytes with serial chemistries     Lactic acidosis   Lactic acidosis likely secondary to sepsis with concurrent volume depletion.  Hydrate patient intravenous isotonic fluids while concurrently treating underlying infection  Monitoring serial lactic acid levels to ensure downtrending and resolution.    Acute urinary retention   Patient presenting with 3-day history of difficulty with urination.  Emergency room staff was unsuccessful in placing Foley catheter requiring assistance of urology.  Per urology recommendations, Foley catheter will need to remain in place for a minimum of 1 week  Etiology is possibly prostatitis with prostatic abscess versus recurrence of prostate cancer with prostatic mass  Patient will  need outpatient follow-up with urology as well.    Essential hypertension, benign   Continue home regimen of antihypertensive therapy while monitoring for episodic hypotension in setting of sepsis.    History of prostate cancer   Known history of prostate cancer, diagnosed in 2014 by Dr. Diona Fanti  Patient  was treated with  radiation therapy in 2014  When asking the patient, he denies any knowledge of having prostate cancer.  CT revealing diffuse prostate enlargement with area of low-attenuation - findings could possibly be secondary to recurrence of malignancy.    Macrocytic anemia  No clinical evidence of bleeding  Monitoring hemoglobin and hematocrit with serial CBCs  Obtaining iron panel, folate, B12.  DVT prophylaxis:  Lovenox Code Status:  Full code. Family Communication: No one at bed side. Disposition Plan:  Status is: Inpatient  Remains inpatient appropriate because:Inpatient level of care appropriate due to severity of illness   Dispo: The patient is from: Home              Anticipated d/c is to: SNF              Anticipated d/c date is: 2 days              Patient currently is not medically stable to d/c.      Consultants:   Urology  Procedures:  Foley catheterization  Antimicrobials:  Anti-infectives (From admission, onward)   Start     Dose/Rate Route Frequency Ordered Stop   10/25/19 2300  ceFEPIme (MAXIPIME) 2 g in sodium chloride 0.9 % 100 mL IVPB        2 g 200 mL/hr over 30 Minutes Intravenous Every 24 hours 10/25/19 2240     10/25/19 1930  cefTRIAXone (ROCEPHIN) 1 g in sodium chloride 0.9 % 100 mL IVPB  Status:  Discontinued        1 g 200 mL/hr over 30 Minutes Intravenous Every 24 hours 10/25/19 1921 10/25/19 2238      Subjective: Patient was seen and examined at bed side, Overnight events noted, He reports feeling better, states has a small bowel movement yesterday but still feels full.  Objective: Vitals:   10/27/19 0053 10/27/19 0239 10/27/19 0429 10/27/19 0849  BP: (!) 151/85 124/83 139/86 (!) 154/90  Pulse: (!) 116 (!) 104 97 (!) 104  Resp: 19 18 16 19   Temp: 100.3 F (37.9 C) (!) 100.5 F (38.1 C) 99.2 F (37.3 C) 97.8 F (36.6 C)  TempSrc: Oral Oral Oral   SpO2: 95% 95% 94% 95%  Weight:      Height:        Intake/Output Summary (Last 24  hours) at 10/27/2019 1333 Last data filed at 10/27/2019 1200 Gross per 24 hour  Intake 787.17 ml  Output 2826 ml  Net -2038.83 ml   Filed Weights   10/25/19 1252 10/26/19 1707 10/26/19 2100  Weight: 93.9 kg 88.4 kg 87.4 kg    Examination:  General exam: Appears calm and comfortable  Respiratory system: Clear to auscultation. Respiratory effort normal. Cardiovascular system: S1 & S2 heard, RRR. No JVD, murmurs, rubs, gallops or clicks. No pedal edema. Gastrointestinal system: Abdomen is nondistended, soft and nontender. No organomegaly or masses felt. Normal bowel sounds heard. Central nervous system: Alert and oriented. No focal neurological deficits. Extremities:  No edema, no cyanosis, no clubbing. Skin: No rashes, lesions or ulcers Psychiatry: Judgement and insight appear normal. Mood & affect appropriate.     Data Reviewed: I have personally reviewed following labs and imaging studies  CBC: Recent Labs  Lab 10/25/19 1330 10/26/19 0631 10/26/19 0818 10/27/19 0248  WBC 21.7* 14.5*  --  10.9*  NEUTROABS  --  11.8*  --   --   HGB 10.6* 8.7* 9.8* 8.8*  HCT 34.4* 28.5* 32.1* 28.0*  MCV  105.5* 106.3*  --  104.1*  PLT 245 174  --  209   Basic Metabolic Panel: Recent Labs  Lab 10/25/19 1330 10/26/19 0631 10/27/19 0248  NA 135 139 138  K 4.0 3.9 3.7  CL 98 102 102  CO2 25 27 25   GLUCOSE 162* 127* 157*  BUN 19 20 15   CREATININE 2.49* 2.10* 1.73*  CALCIUM 9.1 8.5* 8.7*  MG  --  2.6* 1.9  PHOS  --   --  2.2*   GFR: Estimated Creatinine Clearance: 38 mL/min (A) (by C-G formula based on SCr of 1.73 mg/dL (H)). Liver Function Tests: Recent Labs  Lab 10/25/19 1330 10/26/19 0631 10/27/19 0248  AST 34 28 40  ALT 23 20 28   ALKPHOS 89 66 60  BILITOT 1.1 0.7 0.6  PROT 7.6 6.0* 6.4*  ALBUMIN 2.9* 2.2* 2.2*   Recent Labs  Lab 10/25/19 1330  LIPASE 32   No results for input(s): AMMONIA in the last 168 hours. Coagulation Profile: Recent Labs  Lab  10/25/19 2048 10/26/19 0631  INR 1.4* 1.4*   Cardiac Enzymes: No results for input(s): CKTOTAL, CKMB, CKMBINDEX, TROPONINI in the last 168 hours. BNP (last 3 results) No results for input(s): PROBNP in the last 8760 hours. HbA1C: No results for input(s): HGBA1C in the last 72 hours. CBG: No results for input(s): GLUCAP in the last 168 hours. Lipid Profile: No results for input(s): CHOL, HDL, LDLCALC, TRIG, CHOLHDL, LDLDIRECT in the last 72 hours. Thyroid Function Tests: No results for input(s): TSH, T4TOTAL, FREET4, T3FREE, THYROIDAB in the last 72 hours. Anemia Panel: Recent Labs    10/26/19 0631  VITAMINB12 1,012*  FOLATE 16.9  TIBC 125*  IRON 28*   Sepsis Labs: Recent Labs  Lab 10/25/19 1537 10/26/19 0029 10/26/19 0631  PROCALCITON  --   --  9.47  LATICACIDVEN 2.1* 1.0 0.9    Recent Results (from the past 240 hour(s))  Urine culture     Status: Abnormal   Collection Time: 10/19/19  4:38 PM   Specimen: Urine, Random  Result Value Ref Range Status   Specimen Description URINE, RANDOM  Final   Special Requests   Final    NONE Performed at [facility], [place] [address], [place], [place] [address]    Culture (A)  Final    >=100,000 COLONIES/mL MULTIPLE SPECIES PRESENT, SUGGEST RECOLLECTION   Report Status 10/20/2019 FINAL  Final  Blood culture (routine x 2)     Status: None (Preliminary result)   Collection Time: 10/25/19  3:10 PM   Specimen: BLOOD  Result Value Ref Range Status   Specimen Description BLOOD RIGHT ANTECUBITAL  Final   Special Requests   Final    BOTTLES DRAWN AEROBIC AND ANAEROBIC Blood Culture results may not be optimal due to an inadequate volume of blood received in culture bottles   Culture   Final    NO GROWTH 2 DAYS Performed at [facility], [place] [address], [place], [place] [address]    Report Status PENDING  Incomplete  Blood culture (routine x 2)     Status: None (Preliminary result)   Collection Time: 10/25/19  3:10 PM    Specimen: BLOOD  Result Value Ref Range Status   Specimen Description BLOOD LEFT ANTECUBITAL  Final   Special Requests   Final    BOTTLES DRAWN AEROBIC AND ANAEROBIC Blood Culture results may not be optimal due to an inadequate volume of blood received in culture bottles  Culture   Final    NO GROWTH 2 DAYS Performed at [facility], [place] [address], [place], [place] [address]    Report Status PENDING  Incomplete  Urine Culture     Status: None (Preliminary result)   Collection Time: 10/25/19  3:38 PM   Specimen: Urine, Random  Result Value Ref Range Status   Specimen Description URINE, RANDOM  Final   Special Requests NONE  Final   Culture   Final    CULTURE REINCUBATED FOR BETTER GROWTH Performed at [facility], [place] [address], [place], [place] [address]    Report Status PENDING  Incomplete  Respiratory Panel by RT PCR (Flu A&B, Covid) - Nasopharyngeal Swab     Status: None   Collection Time: 10/25/19  5:30 PM   Specimen: Nasopharyngeal Swab  Result Value Ref Range Status   SARS Coronavirus 2 by RT PCR NEGATIVE NEGATIVE Final    Comment: (NOTE) SARS-CoV-2 target nucleic acids are NOT DETECTED.  The SARS-CoV-2 RNA is generally detectable in upper respiratoy specimens during the acute phase of infection. The lowest concentration of SARS-CoV-2 viral copies this assay can detect is 131 copies/mL. A negative result does not preclude SARS-Cov-2 infection and should not be used as the sole basis for treatment or other patient management decisions. A negative result may occur with  improper specimen collection/handling, submission of specimen other than nasopharyngeal swab, presence of viral mutation(s) within the areas targeted by this assay, and inadequate number of viral copies (<131 copies/mL). A negative result must be combined with clinical observations, patient history, and epidemiological information. The expected result is Negative.  Fact Sheet for  Patients:  PinkCheek.be  Fact Sheet for Healthcare Providers:  GravelBags.it  This test is no t yet approved or cleared by the [place] FDA and  has been authorized for detection and/or diagnosis of SARS-CoV-2 by FDA under an Emergency Use Authorization (EUA). This EUA will remain  in effect (meaning this test can be used) for the duration of the COVID-19 declaration under Section 564(b)(1) of the Act, 21 U.S.C. section 360bbb-3(b)(1), unless the authorization is terminated or revoked sooner.     Influenza A by PCR NEGATIVE NEGATIVE Final   Influenza B by PCR NEGATIVE NEGATIVE Final    Comment: (NOTE) The Xpert Xpress SARS-CoV-2/FLU/RSV assay is intended as an aid in  the diagnosis of influenza from Nasopharyngeal swab specimens and  should not be used as a sole basis for treatment. Nasal washings and  aspirates are unacceptable for Xpert Xpress SARS-CoV-2/FLU/RSV  testing.  Fact Sheet for Patients: PinkCheek.be  Fact Sheet for Healthcare Providers: GravelBags.it  This test is not yet approved or cleared by the [place] FDA and  has been authorized for detection and/or diagnosis of SARS-CoV-2 by  FDA under an Emergency Use Authorization (EUA). This EUA will remain  in effect (meaning this test can be used) for the duration of the  Covid-19 declaration under Section 564(b)(1) of the Act, 21  U.S.C. section 360bbb-3(b)(1), unless the authorization is  terminated or revoked. Performed at [facility], [place] [address] [place], [place] [address]      Radiology Studies: CT ABDOMEN PELVIS WO CONTRAST  Result Date: 10/25/2019 CLINICAL DATA:  Sepsis EXAM: CT ABDOMEN AND PELVIS WITHOUT CONTRAST TECHNIQUE: Multidetector CT imaging of the abdomen and pelvis was performed following the standard protocol without IV contrast. COMPARISON:  10/19/2019 and  12/25/2017 FINDINGS: Lower chest: The lung bases are clear. Hepatobiliary:  Multiple low-attenuation lesions throughout the liver, unchanged since prior studies. Likely cysts. Gallbladder and bile ducts are normal. Pancreas: Unremarkable. No pancreatic ductal dilatation or surrounding inflammatory changes. Spleen: Normal in size without focal abnormality. Adrenals/Urinary Tract: No adrenal gland nodules. Right renal atrophy. Large stones in the right renal pelvis measuring 10 and 14 mm in diameter. No change since prior study. Mild right hydronephrosis and hydroureter. No ureteral stones are demonstrated. Ureteral dilatation is new since the previous study. This may indicate reflux or pyelonephritis. A non radiopaque obstructing stone could also be present. The bladder is decompressed with a Foley catheter but there appears to be diffuse bladder wall thickening possibly due to cystitis or outlet obstruction. Mild stranding in the pelvis, likely inflammatory. Stomach/Bowel: Stomach is within normal limits. Appendix appears normal. No evidence of bowel wall thickening, distention, or inflammatory changes. Vascular/Lymphatic: Aortic atherosclerosis. No enlarged abdominal or pelvic lymph nodes. Reproductive: Prostate gland is diffusely enlarged with seed implants present. Low-attenuation region in the left prostate is unchanged since prior study. Possibly mass or abscess. No significant pelvic lymphadenopathy. Other: No free air or free fluid in the abdomen. Moderate-sized periumbilical hernia containing fat. Small right inguinal hernia containing fat. Musculoskeletal: Degenerative changes in the spine and hips. IMPRESSION: 1. Large stones in the right renal pelvis with mild right hydronephrosis and hydroureter. No ureteral stones are demonstrated. This may indicate reflux or pyelonephritis. A non radiopaque obstructing stone could also be present. 2. The bladder is decompressed with a Foley catheter but there appears to  be diffuse bladder wall thickening possibly due to cystitis or outlet obstruction. Mild stranding in the pelvis, likely inflammatory. 3. Prostate gland is diffusely enlarged with seed implants present. Low-attenuation region in the left prostate gland is unchanged since prior study. Possibly mass or abscess. 4. Moderate-sized periumbilical hernia containing fat. Small right inguinal hernia containing fat. Aortic Atherosclerosis (ICD10-I70.0). Electronically Signed   By: Lucienne Capers M.D.   On: 10/25/2019 19:55   DG Chest Portable 1 View  Result Date: 10/25/2019 CLINICAL DATA:  Fever urinary retention EXAM: PORTABLE CHEST 1 VIEW COMPARISON:  12/27/2017 FINDINGS: The heart size and mediastinal contours are within normal limits. Both lungs are clear. The visualized skeletal structures are unremarkable. IMPRESSION: No active disease. Electronically Signed   By: Donavan Foil M.D.   On: 10/25/2019 17:38   Scheduled Meds: . acetaminophen  650 mg Oral Once  . amLODipine  10 mg Oral Daily  . atenolol  50 mg Oral Daily  . Chlorhexidine Gluconate Cloth  6 each Topical Daily  . enoxaparin (LOVENOX) injection  40 mg Subcutaneous Daily  . timolol  1 drop Both Eyes BID   Continuous Infusions: . sodium chloride Stopped (10/27/19 0153)  . ceFEPime (MAXIPIME) IV Stopped (10/27/19 0303)     LOS: 2 days    Time spent: 35 mins.    Shawna Clamp, MD [facility]   If 7PM-7AM, please contact night-coverage

## 2019-10-28 DIAGNOSIS — N390 Urinary tract infection, site not specified: Secondary | ICD-10-CM | POA: Diagnosis not present

## 2019-10-28 DIAGNOSIS — A419 Sepsis, unspecified organism: Secondary | ICD-10-CM | POA: Diagnosis not present

## 2019-10-28 LAB — BASIC METABOLIC PANEL
Anion gap: 13 (ref 5–15)
BUN: 14 mg/dL (ref 8–23)
CO2: 23 mmol/L (ref 22–32)
Calcium: 8.8 mg/dL — ABNORMAL LOW (ref 8.9–10.3)
Chloride: 102 mmol/L (ref 98–111)
Creatinine, Ser: 1.64 mg/dL — ABNORMAL HIGH (ref 0.61–1.24)
GFR, Estimated: 42 mL/min — ABNORMAL LOW (ref >60)
Glucose, Bld: 123 mg/dL — ABNORMAL HIGH (ref 70–99)
Potassium: 4.1 mmol/L (ref 3.5–5.1)
Sodium: 138 mmol/L (ref 135–145)

## 2019-10-28 LAB — CBC
HCT: 31.5 % — ABNORMAL LOW (ref 39.0–52.0)
Hemoglobin: 9.9 g/dL — ABNORMAL LOW (ref 13.0–17.0)
MCH: 33.1 pg (ref 26.0–34.0)
MCHC: 31.4 g/dL (ref 30.0–36.0)
MCV: 105.4 fL — ABNORMAL HIGH (ref 80.0–100.0)
Platelets: 203 10*3/uL (ref 150–400)
RBC: 2.99 MIL/uL — ABNORMAL LOW (ref 4.22–5.81)
RDW: 12.8 % (ref 11.5–15.5)
WBC: 8.4 10*3/uL (ref 4.0–10.5)
nRBC: 0 % (ref 0.0–0.2)

## 2019-10-28 LAB — PHOSPHORUS: Phosphorus: 2.5 mg/dL (ref 2.5–4.6)

## 2019-10-28 LAB — URINE CULTURE: Culture: 80000 — AB

## 2019-10-28 LAB — MAGNESIUM: Magnesium: 1.9 mg/dL (ref 1.7–2.4)

## 2019-10-28 MED ORDER — SODIUM CHLORIDE 0.9 % IV SOLN
1.0000 g | Freq: Two times a day (BID) | INTRAVENOUS | Status: AC
  Administered 2019-10-28 – 2019-10-30 (×6): 1 g via INTRAVENOUS
  Filled 2019-10-28 (×6): qty 1

## 2019-10-28 NOTE — Plan of Care (Signed)
Problem: Education: °Goal: Knowledge of General Education information will improve °Description: Including pain rating scale, medication(s)/side effects and non-pharmacologic comfort measures °Outcome: Completed/Met °  °

## 2019-10-28 NOTE — Progress Notes (Signed)
Pharmacy Antibiotic Note  [name] is a 79 y.o. male admitted on 10/25/2019 with Pyleo.  Pharmacy has been consulted for Cefepime dosing. Urine now growing 80K E.coli ESBL in urine. D/W Dr. Dwyane Dee. Will convert to meropenem for ESBL infection.    Height: 6' (182.9 cm) Weight: 85.1 kg (187 lb 9.8 oz) IBW/kg (Calculated) : 77.6  Temp (24hrs), Avg:99.1 F (37.3 C), Min:97.8 F (36.6 C), Max:100.1 F (37.8 C)  Recent Labs  Lab 10/25/19 1330 10/25/19 1537 10/26/19 0029 10/26/19 0631 10/27/19 0248 10/28/19 0337  WBC 21.7*  --   --  14.5* 10.9* 8.4  CREATININE 2.49*  --   --  2.10* 1.73* 1.64*  LATICACIDVEN  --  2.1* 1.0 0.9  --   --     Estimated Creatinine Clearance: 40.1 mL/min (A) (by C-G formula based on SCr of 1.64 mg/dL (H)).    Allergies  Allergen Reactions  . Tape Other (See Comments)    Redness and blisters/paper tape-not latex  . Doxycycline Hyclate Other (See Comments)    Mental changes   . Sulfa Antibiotics Hives    Antimicrobials this admission: 10/22 Cefepime >>10/25 10/25 Meropenem>>      Dose adjustments this admission: N/a  Microbiology results: 10/22: Urine 80K E.coli ESBL   Plan:  - D/C Cefepime - Meropenem 1gm IV q12h - Monitor patients renal function and urine output  - De-escalate ABX when appropriate   Emmaleah Meroney A. Levada Dy, PharmD, BCPS, FNKF Clinical Pharmacist [place] Please utilize Amion for appropriate phone number to reach the unit pharmacist ([place])   10/28/2019 8:19 AM

## 2019-10-28 NOTE — Progress Notes (Signed)
PROGRESS NOTE    [name]  JJK:[id number] DOB: 04/12/1940 DOA: 10/25/2019 PCP: Patient, No Pcp Per   Brief Narrative: This 79 years old Male with PMH of hypertension, CKD stage III, anxiety disorder, chronic pain syndrome, prostate cancer s/p radiation therapy presenting in the ED with inability to urinate.  Patient has progressively increasing difficulty with urination for last 3 days.  Multiple attempts to place Foley catheter in the ED were unsuccessful. Urology was consulted and patient successfully had urinary catheter.  CT abdomen and pelvis revealed right-sided hydronephrosis and hydroureter with concerns for pyelonephritis.  Patient is started on IV fluids,  IV ceftriaxone and admitted for further evaluation. Urology recommended outpatient management , keep foley catheter for one week, voiding trial as an outpatient. Urine culture grew ESBL, antibiotics changed to meropenum.  Assessment & Plan:   Principal Problem:   Sepsis secondary to UTI [facility]) Active Problems:   Essential hypertension, benign   Complicated UTI (urinary tract infection)   Acute renal failure superimposed on stage 3 chronic kidney disease (HCC)   Pyelonephritis of right kidney   History of prostate cancer   Lactic acidosis   Macrocytic anemia   Acute urinary retention  Sepsis secondary to UTI/ Pyelonephritis :   Patient presented with multiple sirs criteria in the setting of lactic acidosis and acute kidney injury all thought to be secondary from right-sided pyelonephritis and possible prostatitis with prostatic abscess with secondary sepsis.  Patient has been initiated on intravenous ceftriaxone in the emergency department which was changed to cefepime until urine cultures return  Foley catheter has been graciously placed by urology due to multiple failed attempts.  There is diffuse swelling of the prostate with the possibility of an abscess but considering patient's known history of prostate  malignancy I feel that recurrent prostate mass is just as likely -Urology is following  Hydrating patient aggressively with intravenous isotonic fluids  Blood  Culture no growth so far, urine culture grew ESBL, Antibiotics changed to meropenem.  Monitoring closely for symptomatic improvement.       Urology  recommended outpatient management , keep foley catheter for one week, voiding trial as an outpatient.     Acute renal failure superimposed on stage 3 chronic kidney disease (HCC)  >>>> Improving.   Patient is suffering from acute kidney injury likely secondary to postobstructive uropathy in addition to sepsis.  Foley catheter has been placed by urology in the emergency department to relieve postobstructive uropathy.  Hydrating patient with intravenous isotonic fluids, continuing broad-spectrum intravenous antibiotic therapy.  Strict input and output monitoring  Monitoring renal function electrolytes with serial chemistries     Lactic acidosis >>> Resolved.   Lactic acidosis likely secondary to sepsis with concurrent volume depletion.  Hydrate patient intravenous isotonic fluids while concurrently treating underlying infection  Monitoring serial lactic acid levels to ensure downtrending and resolution.    Acute urinary retention >>>> Improved.   Patient presenting with 3-day history of difficulty with urination.  Emergency room staff was unsuccessful in placing Foley catheter requiring assistance of urology.  Per urology recommendations, Foley catheter will need to remain in place for a minimum of 1 week  Etiology is possibly prostatitis with prostatic abscess versus recurrence of prostate cancer with prostatic mass  Patient will  need outpatient follow-up with urology as well.    Essential hypertension, benign   Continue home regimen of antihypertensive therapy while monitoring for episodic hypotension in setting of sepsis.    History of prostate  cancer   Known history of prostate cancer, diagnosed in 2014 by Dr. Diona Fanti  Patient was treated with radiation therapy in 2014  When asking the patient, he denies any knowledge of having prostate cancer.  CT revealing diffuse prostate enlargement with area of low-attenuation - findings could possibly be secondary to recurrence of malignancy.    Macrocytic anemia  No clinical evidence of bleeding  Monitoring hemoglobin and hematocrit with serial CBCs  Obtaining iron panel, folate, B12.  DVT prophylaxis:  Lovenox Code Status:  Full code. Family Communication: No one at bed side. Disposition Plan:  Status is: Inpatient  Remains inpatient appropriate because:Inpatient level of care appropriate due to severity of illness   Dispo: The patient is from: Home              Anticipated d/c is to: SNF              Anticipated d/c date is: 2 days              Patient currently is not medically stable to d/c. Ongoing treatment for UTI  requring meropenem.     Consultants:   Urology  Procedures:  Foley catheterization  Antimicrobials:  Anti-infectives (From admission, onward)   Start     Dose/Rate Route Frequency Ordered Stop   10/28/19 1000  meropenem (MERREM) 1 g in sodium chloride 0.9 % 100 mL IVPB        1 g 200 mL/hr over 30 Minutes Intravenous Every 12 hours 10/28/19 0818     10/25/19 2300  ceFEPIme (MAXIPIME) 2 g in sodium chloride 0.9 % 100 mL IVPB  Status:  Discontinued        2 g 200 mL/hr over 30 Minutes Intravenous Every 24 hours 10/25/19 2240 10/28/19 0818   10/25/19 1930  cefTRIAXone (ROCEPHIN) 1 g in sodium chloride 0.9 % 100 mL IVPB  Status:  Discontinued        1 g 200 mL/hr over 30 Minutes Intravenous Every 24 hours 10/25/19 1921 10/25/19 2238      Subjective: Patient was seen and examined at bed side, Overnight events noted, He spiked fever 100.5 last evening. He reports feeling better, had small bowel movement yesterday , reports able to urinate  well.  Objective: Vitals:   10/27/19 1735 10/27/19 2058 10/28/19 0444 10/28/19 0841  BP: (!) 147/73 (!) 151/89 128/83 (!) 145/89  Pulse: (!) 107 96 98 94  Resp: 19 (!) 22 20 20   Temp: 98.4 F (36.9 C) 100.1 F (37.8 C) 100.1 F (37.8 C) 99 F (37.2 C)  TempSrc:  Oral Oral Oral  SpO2: 97% 95% 98% 95%  Weight:  85.1 kg    Height:        Intake/Output Summary (Last 24 hours) at 10/28/2019 1515 Last data filed at 10/28/2019 1024 Gross per 24 hour  Intake 340 ml  Output 2150 ml  Net -1810 ml   Filed Weights   10/26/19 1707 10/26/19 2100 10/27/19 2058  Weight: 88.4 kg 87.4 kg 85.1 kg    Examination:  General exam: Appears calm and comfortable. Respiratory system: Clear to auscultation. Respiratory effort normal. Cardiovascular system: S1 & S2 heard, RRR. No JVD, murmurs, rubs, gallops or clicks. No pedal edema. Gastrointestinal system: Abdomen is nondistended, soft and nontender. No organomegaly or masses felt. Normal bowel sounds heard. Central nervous system: Alert and oriented. No focal neurological deficits. GU: Scrotal edema noted. Extremities:  No edema, no cyanosis, no clubbing. Skin: No rashes, lesions or ulcers  Psychiatry: Judgement and insight appear normal. Mood & affect appropriate.     Data Reviewed: I have personally reviewed following labs and imaging studies  CBC: Recent Labs  Lab 10/25/19 1330 10/26/19 0631 10/26/19 0818 10/27/19 0248 10/28/19 0337  WBC 21.7* 14.5*  --  10.9* 8.4  NEUTROABS  --  11.8*  --   --   --   HGB 10.6* 8.7* 9.8* 8.8* 9.9*  HCT 34.4* 28.5* 32.1* 28.0* 31.5*  MCV 105.5* 106.3*  --  104.1* 105.4*  PLT 245 174  --  178 017   Basic Metabolic Panel: Recent Labs  Lab 10/25/19 1330 10/26/19 0631 10/27/19 0248 10/28/19 0337  NA 135 139 138 138  K 4.0 3.9 3.7 4.1  CL 98 102 102 102  CO2 25 27 25 23   GLUCOSE 162* 127* 157* 123*  BUN 19 20 15 14   CREATININE 2.49* 2.10* 1.73* 1.64*  CALCIUM 9.1 8.5* 8.7* 8.8*  MG  --   2.6* 1.9 1.9  PHOS  --   --  2.2* 2.5   GFR: Estimated Creatinine Clearance: 40.1 mL/min (A) (by C-G formula based on SCr of 1.64 mg/dL (H)). Liver Function Tests: Recent Labs  Lab 10/25/19 1330 10/26/19 0631 10/27/19 0248  AST 34 28 40  ALT 23 20 28   ALKPHOS 89 66 60  BILITOT 1.1 0.7 0.6  PROT 7.6 6.0* 6.4*  ALBUMIN 2.9* 2.2* 2.2*   Recent Labs  Lab 10/25/19 1330  LIPASE 32   No results for input(s): AMMONIA in the last 168 hours. Coagulation Profile: Recent Labs  Lab 10/25/19 2048 10/26/19 0631  INR 1.4* 1.4*   Cardiac Enzymes: No results for input(s): CKTOTAL, CKMB, CKMBINDEX, TROPONINI in the last 168 hours. BNP (last 3 results) No results for input(s): PROBNP in the last 8760 hours. HbA1C: No results for input(s): HGBA1C in the last 72 hours. CBG: No results for input(s): GLUCAP in the last 168 hours. Lipid Profile: No results for input(s): CHOL, HDL, LDLCALC, TRIG, CHOLHDL, LDLDIRECT in the last 72 hours. Thyroid Function Tests: No results for input(s): TSH, T4TOTAL, FREET4, T3FREE, THYROIDAB in the last 72 hours. Anemia Panel: Recent Labs    10/26/19 0631  VITAMINB12 1,012*  FOLATE 16.9  TIBC 125*  IRON 28*   Sepsis Labs: Recent Labs  Lab 10/25/19 1537 10/26/19 0029 10/26/19 0631  PROCALCITON  --   --  9.47  LATICACIDVEN 2.1* 1.0 0.9    Recent Results (from the past 240 hour(s))  Urine culture     Status: Abnormal   Collection Time: 10/19/19  4:38 PM   Specimen: Urine, Random  Result Value Ref Range Status   Specimen Description URINE, RANDOM  Final   Special Requests   Final    NONE Performed at [facility], [place] [address] [place], [place] [address]    Culture (A)  Final    >=100,000 COLONIES/mL MULTIPLE SPECIES PRESENT, SUGGEST RECOLLECTION   Report Status 10/20/2019 FINAL  Final  Blood culture (routine x 2)     Status: None (Preliminary result)   Collection Time: 10/25/19  3:10 PM   Specimen: BLOOD  Result Value Ref Range  Status   Specimen Description BLOOD RIGHT ANTECUBITAL  Final   Special Requests   Final    BOTTLES DRAWN AEROBIC AND ANAEROBIC Blood Culture results may not be optimal due to an inadequate volume of blood received in culture bottles   Culture   Final    NO GROWTH 3 DAYS Performed  at [facility], [place] [address] [place], [place] [address]    Report Status PENDING  Incomplete  Blood culture (routine x 2)     Status: None (Preliminary result)   Collection Time: 10/25/19  3:10 PM   Specimen: BLOOD  Result Value Ref Range Status   Specimen Description BLOOD LEFT ANTECUBITAL  Final   Special Requests   Final    BOTTLES DRAWN AEROBIC AND ANAEROBIC Blood Culture results may not be optimal due to an inadequate volume of blood received in culture bottles   Culture   Final    NO GROWTH 3 DAYS Performed at [facility], [place] [address], [place], [place] [address]    Report Status PENDING  Incomplete  Urine Culture     Status: Abnormal   Collection Time: 10/25/19  3:38 PM   Specimen: Urine, Random  Result Value Ref Range Status   Specimen Description URINE, RANDOM  Final   Special Requests   Final    NONE Performed at [facility], [place] [address] [place], [place] [address]    Culture (A)  Final    80,000 COLONIES/mL ESCHERICHIA COLI Confirmed Extended Spectrum Beta-Lactamase Producer (ESBL).  In bloodstream infections from ESBL organisms, carbapenems are preferred over piperacillin/tazobactam. They are shown to have a lower risk of mortality.    Report Status 10/28/2019 FINAL  Final   Organism ID, Bacteria ESCHERICHIA COLI (A)  Final      Susceptibility   Escherichia coli - MIC*    AMPICILLIN >=32 RESISTANT Resistant     CEFAZOLIN >=64 RESISTANT Resistant     CEFTRIAXONE >=64 RESISTANT Resistant     CIPROFLOXACIN >=4 RESISTANT Resistant     GENTAMICIN 4 SENSITIVE Sensitive     IMIPENEM <=0.25 SENSITIVE Sensitive     NITROFURANTOIN 64 INTERMEDIATE Intermediate      TRIMETH/SULFA <=20 SENSITIVE Sensitive     AMPICILLIN/SULBACTAM >=32 RESISTANT Resistant     PIP/TAZO <=4 SENSITIVE Sensitive     * 80,000 COLONIES/mL ESCHERICHIA COLI  Respiratory Panel by RT PCR (Flu A&B, Covid) - Nasopharyngeal Swab     Status: None   Collection Time: 10/25/19  5:30 PM   Specimen: Nasopharyngeal Swab  Result Value Ref Range Status   SARS Coronavirus 2 by RT PCR NEGATIVE NEGATIVE Final    Comment: (NOTE) SARS-CoV-2 target nucleic acids are NOT DETECTED.  The SARS-CoV-2 RNA is generally detectable in upper respiratoy specimens during the acute phase of infection. The lowest concentration of SARS-CoV-2 viral copies this assay can detect is 131 copies/mL. A negative result does not preclude SARS-Cov-2 infection and should not be used as the sole basis for treatment or other patient management decisions. A negative result may occur with  improper specimen collection/handling, submission of specimen other than nasopharyngeal swab, presence of viral mutation(s) within the areas targeted by this assay, and inadequate number of viral copies (<131 copies/mL). A negative result must be combined with clinical observations, patient history, and epidemiological information. The expected result is Negative.  Fact Sheet for Patients:  PinkCheek.be  Fact Sheet for Healthcare Providers:  GravelBags.it  This test is no t yet approved or cleared by the [place] FDA and  has been authorized for detection and/or diagnosis of SARS-CoV-2 by FDA under an Emergency Use Authorization (EUA). This EUA will remain  in effect (meaning this test can be used) for the duration of the COVID-19 declaration under Section 564(b)(1) of the Act, 21 U.S.C. section 360bbb-3(b)(1), unless  the authorization is terminated or revoked sooner.     Influenza A by PCR NEGATIVE NEGATIVE Final   Influenza B by PCR NEGATIVE NEGATIVE Final     Comment: (NOTE) The Xpert Xpress SARS-CoV-2/FLU/RSV assay is intended as an aid in  the diagnosis of influenza from Nasopharyngeal swab specimens and  should not be used as a sole basis for treatment. Nasal washings and  aspirates are unacceptable for Xpert Xpress SARS-CoV-2/FLU/RSV  testing.  Fact Sheet for Patients: PinkCheek.be  Fact Sheet for Healthcare Providers: GravelBags.it  This test is not yet approved or cleared by the [place] FDA and  has been authorized for detection and/or diagnosis of SARS-CoV-2 by  FDA under an Emergency Use Authorization (EUA). This EUA will remain  in effect (meaning this test can be used) for the duration of the  Covid-19 declaration under Section 564(b)(1) of the Act, 21  U.S.C. section 360bbb-3(b)(1), unless the authorization is  terminated or revoked. Performed at [facility], [place] [address] [place],  [address]      Radiology Studies: No results found. Scheduled Meds: . acetaminophen  650 mg Oral Once  . amLODipine  10 mg Oral Daily  . atenolol  50 mg Oral Daily  . Chlorhexidine Gluconate Cloth  6 each Topical Daily  . enoxaparin (LOVENOX) injection  40 mg Subcutaneous Daily  . timolol  1 drop Both Eyes BID   Continuous Infusions: . sodium chloride Stopped (10/27/19 0153)  . meropenem (MERREM) IV 1 g (10/28/19 1110)     LOS: 3 days    Time spent: 25 mins.    Shawna Clamp, MD [facility]   If 7PM-7AM, please contact night-coverage

## 2019-10-29 DIAGNOSIS — N390 Urinary tract infection, site not specified: Secondary | ICD-10-CM | POA: Diagnosis not present

## 2019-10-29 DIAGNOSIS — A419 Sepsis, unspecified organism: Secondary | ICD-10-CM | POA: Diagnosis not present

## 2019-10-29 LAB — BASIC METABOLIC PANEL
Anion gap: 10 (ref 5–15)
BUN: 19 mg/dL (ref 8–23)
CO2: 26 mmol/L (ref 22–32)
Calcium: 8.8 mg/dL — ABNORMAL LOW (ref 8.9–10.3)
Chloride: 101 mmol/L (ref 98–111)
Creatinine, Ser: 1.75 mg/dL — ABNORMAL HIGH (ref 0.61–1.24)
GFR, Estimated: 39 mL/min — ABNORMAL LOW (ref >60)
Glucose, Bld: 128 mg/dL — ABNORMAL HIGH (ref 70–99)
Potassium: 3.9 mmol/L (ref 3.5–5.1)
Sodium: 137 mmol/L (ref 135–145)

## 2019-10-29 LAB — MAGNESIUM: Magnesium: 2.1 mg/dL (ref 1.7–2.4)

## 2019-10-29 LAB — HEMOGLOBIN AND HEMATOCRIT, BLOOD
HCT: 34.2 % — ABNORMAL LOW (ref 39.0–52.0)
Hemoglobin: 10.4 g/dL — ABNORMAL LOW (ref 13.0–17.0)

## 2019-10-29 LAB — PHOSPHORUS: Phosphorus: 2.3 mg/dL — ABNORMAL LOW (ref 2.5–4.6)

## 2019-10-29 MED ORDER — SENNA 8.6 MG PO TABS
1.0000 | ORAL_TABLET | Freq: Every day | ORAL | Status: DC
  Administered 2019-10-29 – 2019-11-02 (×5): 8.6 mg via ORAL
  Filled 2019-10-29 (×5): qty 1

## 2019-10-29 MED ORDER — K PHOS MONO-SOD PHOS DI & MONO 155-852-130 MG PO TABS
250.0000 mg | ORAL_TABLET | Freq: Three times a day (TID) | ORAL | Status: AC
  Administered 2019-10-29 – 2019-10-30 (×6): 250 mg via ORAL
  Filled 2019-10-29 (×6): qty 1

## 2019-10-29 NOTE — Plan of Care (Signed)
  Problem: Clinical Measurements: Goal: Diagnostic test results will improve Outcome: Completed/Met   Problem: Coping: Goal: Level of anxiety will decrease Outcome: Completed/Met   Problem: Urinary Elimination: Goal: Signs and symptoms of infection will decrease Outcome: Completed/Met

## 2019-10-29 NOTE — Progress Notes (Signed)
PROGRESS NOTE    [name]  ZOX:[id number] DOB: 08-14-1940 DOA: 10/25/2019 PCP: Patient, No Pcp Per   Brief Narrative: This 79 years old Male with PMH of hypertension, CKD stage III, anxiety disorder, chronic pain syndrome, prostate cancer s/p radiation therapy presenting in the ED with inability to urinate.  Patient has progressively increasing difficulty with urination for last 3 days.  Multiple attempts to place Foley catheter in the ED were unsuccessful. Urology was consulted and patient successfully had urinary catheter.  CT abdomen and pelvis revealed right-sided hydronephrosis and hydroureter with concerns for pyelonephritis.  Patient is started on IV fluids,  IV ceftriaxone and admitted for further evaluation. Urology recommended outpatient management , keep foley catheter for one week, voiding trial as an outpatient. Urine culture grew ESBL, antibiotics changed to meropenum.  Assessment & Plan:   Principal Problem:   Sepsis secondary to UTI [facility]) Active Problems:   Essential hypertension, benign   Complicated UTI (urinary tract infection)   Acute renal failure superimposed on stage 3 chronic kidney disease (HCC)   Pyelonephritis of right kidney   History of prostate cancer   Lactic acidosis   Macrocytic anemia   Acute urinary retention  Sepsis secondary to UTI/ Pyelonephritis :   Patient presented with multiple sirs criteria in the setting of lactic acidosis and acute kidney injury all thought to be secondary from right-sided pyelonephritis and possible prostatitis with prostatic abscess with secondary sepsis.  Patient has been initiated on intravenous ceftriaxone in the emergency department which was changed to cefepime until urine cultures return  Foley catheter has been graciously placed by urology due to multiple failed attempts.  There is diffuse swelling of the prostate with the possibility of an abscess but considering patient's known history of prostate  malignancy we feel that recurrent prostate mass is just as likely -Urology is following  Hydrating patient aggressively with intravenous isotonic fluids  Blood  Culture no growth so far, urine culture grew ESBL, Antibiotics changed to meropenem.  Monitoring closely for symptomatic improvement.       Urology  recommended outpatient management , keep foley catheter for one week, voiding trial as an outpatient.     Acute renal failure superimposed on stage 3 chronic kidney disease (HCC)  >>>> Improving.   Patient is suffering from acute kidney injury likely secondary to postobstructive uropathy in addition to sepsis.  Foley catheter has been placed by urology in the emergency department to relieve postobstructive uropathy.  Hydrating patient with intravenous isotonic fluids, continuing broad-spectrum intravenous antibiotic therapy.  Strict input and output monitoring  Monitoring renal function electrolytes with serial chemistries     Lactic acidosis >>> Resolved.   Lactic acidosis likely secondary to sepsis with concurrent volume depletion.  Hydrate patient intravenous isotonic fluids while concurrently treating underlying infection  Monitoring serial lactic acid levels to ensure downtrending and resolution.    Acute urinary retention >>>> Improved.   Patient presenting with 3-day history of difficulty with urination.  Emergency room staff was unsuccessful in placing Foley catheter requiring assistance of urology.  Per urology recommendations, Foley catheter will need to remain in place for a minimum of 1 week  Etiology is possibly prostatitis with prostatic abscess versus recurrence of prostate cancer with prostatic mass  Patient will  need outpatient follow-up with urology as well.    Essential hypertension, benign   Continue home regimen of antihypertensive therapy while monitoring for episodic hypotension in setting of sepsis.    History of prostate  cancer   Known history of prostate cancer, diagnosed in 2014 by Dr. Diona Fanti  Patient was treated with radiation therapy in 2014  When asking the patient, he denies any knowledge of having prostate cancer.  CT revealing diffuse prostate enlargement with area of low-attenuation - findings could possibly be secondary to recurrence of malignancy.    Macrocytic anemia  No clinical evidence of bleeding  Monitoring hemoglobin and hematocrit with serial CBCs  B12 and folate normal. Iron low.  DVT prophylaxis:  Lovenox Code Status:  Full code. Family Communication: No one at bed side. Disposition Plan:  Status is: Inpatient  Remains inpatient appropriate because:Inpatient level of care appropriate due to severity of illness   Dispo: The patient is from: Home              Anticipated d/c is to: SNF              Anticipated d/c date is: 2 days              Patient currently is not medically stable to d/c. Ongoing treatment for UTI  requring meropenem.     Consultants:   Urology  Procedures:  Foley catheterization  Antimicrobials:  Anti-infectives (From admission, onward)   Start     Dose/Rate Route Frequency Ordered Stop   10/28/19 1000  meropenem (MERREM) 1 g in sodium chloride 0.9 % 100 mL IVPB        1 g 200 mL/hr over 30 Minutes Intravenous Every 12 hours 10/28/19 0818     10/25/19 2300  ceFEPIme (MAXIPIME) 2 g in sodium chloride 0.9 % 100 mL IVPB  Status:  Discontinued        2 g 200 mL/hr over 30 Minutes Intravenous Every 24 hours 10/25/19 2240 10/28/19 0818   10/25/19 1930  cefTRIAXone (ROCEPHIN) 1 g in sodium chloride 0.9 % 100 mL IVPB  Status:  Discontinued        1 g 200 mL/hr over 30 Minutes Intravenous Every 24 hours 10/25/19 1921 10/25/19 2238      Subjective: Patient was seen and examined at bed side, Overnight events noted,  He reports feeling better, had no bowel movement in 2 days, states he feels full  , reports able to urinate  well.  Objective: Vitals:   10/28/19 2122 10/28/19 2300 10/29/19 0329 10/29/19 0919  BP: (!) 146/84  128/81 122/77  Pulse: 93  94 96  Resp:   18 16  Temp:  98.6 F (37 C) 98.3 F (36.8 C) 98.9 F (37.2 C)  TempSrc:  Oral Oral Oral  SpO2: 96%  96% 94%  Weight: 84.9 kg     Height:        Intake/Output Summary (Last 24 hours) at 10/29/2019 1511 Last data filed at 10/29/2019 0925 Gross per 24 hour  Intake 560 ml  Output 1150 ml  Net -590 ml   Filed Weights   10/26/19 2100 10/27/19 2058 10/28/19 2122  Weight: 87.4 kg 85.1 kg 84.9 kg    Examination:  General exam: Appears calm and comfortable. Respiratory system: Clear to auscultation. Respiratory effort normal. Cardiovascular system: S1 & S2 heard, RRR. No JVD, murmurs, rubs, gallops or clicks. No pedal edema. Gastrointestinal system: Abdomen is nondistended, soft and nontender. No organomegaly or masses felt. Normal bowel sounds heard. Central nervous system: Alert and oriented. No focal neurological deficits. GU: Scrotal edema noted. Extremities:  No edema, no cyanosis, no clubbing. Skin: No rashes, lesions or ulcers Psychiatry: Judgement and insight  appear normal. Mood & affect appropriate.     Data Reviewed: I have personally reviewed following labs and imaging studies  CBC: Recent Labs  Lab 10/25/19 1330 10/25/19 1330 10/26/19 0631 10/26/19 0818 10/27/19 0248 10/28/19 0337 10/29/19 0203  WBC 21.7*  --  14.5*  --  10.9* 8.4  --   NEUTROABS  --   --  11.8*  --   --   --   --   HGB 10.6*   < > 8.7* 9.8* 8.8* 9.9* 10.4*  HCT 34.4*   < > 28.5* 32.1* 28.0* 31.5* 34.2*  MCV 105.5*  --  106.3*  --  104.1* 105.4*  --   PLT 245  --  174  --  178 203  --    < > = values in this interval not displayed.   Basic Metabolic Panel: Recent Labs  Lab 10/25/19 1330 10/26/19 0631 10/27/19 0248 10/28/19 0337 10/29/19 0203  NA 135 139 138 138 137  K 4.0 3.9 3.7 4.1 3.9  CL 98 102 102 102 101  CO2 25 27 25 23 26    GLUCOSE 162* 127* 157* 123* 128*  BUN 19 20 15 14 19   CREATININE 2.49* 2.10* 1.73* 1.64* 1.75*  CALCIUM 9.1 8.5* 8.7* 8.8* 8.8*  MG  --  2.6* 1.9 1.9 2.1  PHOS  --   --  2.2* 2.5 2.3*   GFR: Estimated Creatinine Clearance: 37.6 mL/min (A) (by C-G formula based on SCr of 1.75 mg/dL (H)). Liver Function Tests: Recent Labs  Lab 10/25/19 1330 10/26/19 0631 10/27/19 0248  AST 34 28 40  ALT 23 20 28   ALKPHOS 89 66 60  BILITOT 1.1 0.7 0.6  PROT 7.6 6.0* 6.4*  ALBUMIN 2.9* 2.2* 2.2*   Recent Labs  Lab 10/25/19 1330  LIPASE 32   No results for input(s): AMMONIA in the last 168 hours. Coagulation Profile: Recent Labs  Lab 10/25/19 2048 10/26/19 0631  INR 1.4* 1.4*   Cardiac Enzymes: No results for input(s): CKTOTAL, CKMB, CKMBINDEX, TROPONINI in the last 168 hours. BNP (last 3 results) No results for input(s): PROBNP in the last 8760 hours. HbA1C: No results for input(s): HGBA1C in the last 72 hours. CBG: No results for input(s): GLUCAP in the last 168 hours. Lipid Profile: No results for input(s): CHOL, HDL, LDLCALC, TRIG, CHOLHDL, LDLDIRECT in the last 72 hours. Thyroid Function Tests: No results for input(s): TSH, T4TOTAL, FREET4, T3FREE, THYROIDAB in the last 72 hours. Anemia Panel: No results for input(s): VITAMINB12, FOLATE, FERRITIN, TIBC, IRON, RETICCTPCT in the last 72 hours. Sepsis Labs: Recent Labs  Lab 10/25/19 1537 10/26/19 0029 10/26/19 0631  PROCALCITON  --   --  9.47  LATICACIDVEN 2.1* 1.0 0.9    Recent Results (from the past 240 hour(s))  Urine culture     Status: Abnormal   Collection Time: 10/19/19  4:38 PM   Specimen: Urine, Random  Result Value Ref Range Status   Specimen Description URINE, RANDOM  Final   Special Requests   Final    NONE Performed at [facility], [address], [place], [place] [address]    Culture (A)  Final    >=100,000 COLONIES/mL MULTIPLE SPECIES PRESENT, SUGGEST RECOLLECTION   Report Status 10/20/2019  FINAL  Final  Blood culture (routine x 2)     Status: None (Preliminary result)   Collection Time: 10/25/19  3:10 PM   Specimen: BLOOD  Result Value Ref Range Status   Specimen Description BLOOD  RIGHT ANTECUBITAL  Final   Special Requests   Final    BOTTLES DRAWN AEROBIC AND ANAEROBIC Blood Culture results may not be optimal due to an inadequate volume of blood received in culture bottles   Culture   Final    NO GROWTH 4 DAYS Performed at [facility], [place] [address], [place], [place] [address]    Report Status PENDING  Incomplete  Blood culture (routine x 2)     Status: None (Preliminary result)   Collection Time: 10/25/19  3:10 PM   Specimen: BLOOD  Result Value Ref Range Status   Specimen Description BLOOD LEFT ANTECUBITAL  Final   Special Requests   Final    BOTTLES DRAWN AEROBIC AND ANAEROBIC Blood Culture results may not be optimal due to an inadequate volume of blood received in culture bottles   Culture   Final    NO GROWTH 4 DAYS Performed at [facility], [place] [address] [place], [place] [address]    Report Status PENDING  Incomplete  Urine Culture     Status: Abnormal   Collection Time: 10/25/19  3:38 PM   Specimen: Urine, Random  Result Value Ref Range Status   Specimen Description URINE, RANDOM  Final   Special Requests   Final    NONE Performed at [facility], [place] [address], [place], [place] [address]    Culture (A)  Final    80,000 COLONIES/mL ESCHERICHIA COLI Confirmed Extended Spectrum Beta-Lactamase Producer (ESBL).  In bloodstream infections from ESBL organisms, carbapenems are preferred over piperacillin/tazobactam. They are shown to have a lower risk of mortality.    Report Status 10/28/2019 FINAL  Final   Organism ID, Bacteria ESCHERICHIA COLI (A)  Final      Susceptibility   Escherichia coli - MIC*    AMPICILLIN >=32 RESISTANT Resistant     CEFAZOLIN >=64 RESISTANT Resistant     CEFTRIAXONE >=64 RESISTANT Resistant      CIPROFLOXACIN >=4 RESISTANT Resistant     GENTAMICIN 4 SENSITIVE Sensitive     IMIPENEM <=0.25 SENSITIVE Sensitive     NITROFURANTOIN 64 INTERMEDIATE Intermediate     TRIMETH/SULFA <=20 SENSITIVE Sensitive     AMPICILLIN/SULBACTAM >=32 RESISTANT Resistant     PIP/TAZO <=4 SENSITIVE Sensitive     * 80,000 COLONIES/mL ESCHERICHIA COLI  Respiratory Panel by RT PCR (Flu A&B, Covid) - Nasopharyngeal Swab     Status: None   Collection Time: 10/25/19  5:30 PM   Specimen: Nasopharyngeal Swab  Result Value Ref Range Status   SARS Coronavirus 2 by RT PCR NEGATIVE NEGATIVE Final    Comment: (NOTE) SARS-CoV-2 target nucleic acids are NOT DETECTED.  The SARS-CoV-2 RNA is generally detectable in upper respiratoy specimens during the acute phase of infection. The lowest concentration of SARS-CoV-2 viral copies this assay can detect is 131 copies/mL. A negative result does not preclude SARS-Cov-2 infection and should not be used as the sole basis for treatment or other patient management decisions. A negative result may occur with  improper specimen collection/handling, submission of specimen other than nasopharyngeal swab, presence of viral mutation(s) within the areas targeted by this assay, and inadequate number of viral copies (<131 copies/mL). A negative result must be combined with clinical observations, patient history, and epidemiological information. The expected result is Negative.  Fact Sheet for Patients:  PinkCheek.be  Fact Sheet for Healthcare Providers:  GravelBags.it  This test is no t yet approved or cleared by the [place]  FDA and  has been authorized for detection and/or diagnosis of SARS-CoV-2 by FDA under an Emergency Use Authorization (EUA). This EUA will remain  in effect (meaning this test can be used) for the duration of the COVID-19 declaration under Section 564(b)(1) of the Act, 21 U.S.C. section  360bbb-3(b)(1), unless the authorization is terminated or revoked sooner.     Influenza A by PCR NEGATIVE NEGATIVE Final   Influenza B by PCR NEGATIVE NEGATIVE Final    Comment: (NOTE) The Xpert Xpress SARS-CoV-2/FLU/RSV assay is intended as an aid in  the diagnosis of influenza from Nasopharyngeal swab specimens and  should not be used as a sole basis for treatment. Nasal washings and  aspirates are unacceptable for Xpert Xpress SARS-CoV-2/FLU/RSV  testing.  Fact Sheet for Patients: PinkCheek.be  Fact Sheet for Healthcare Providers: GravelBags.it  This test is not yet approved or cleared by the [place] FDA and  has been authorized for detection and/or diagnosis of SARS-CoV-2 by  FDA under an Emergency Use Authorization (EUA). This EUA will remain  in effect (meaning this test can be used) for the duration of the  Covid-19 declaration under Section 564(b)(1) of the Act, 21  U.S.C. section 360bbb-3(b)(1), unless the authorization is  terminated or revoked. Performed at [facility], [place] [address] [place], [place] [address]      Radiology Studies: No results found. Scheduled Meds: . amLODipine  10 mg Oral Daily  . atenolol  50 mg Oral Daily  . Chlorhexidine Gluconate Cloth  6 each Topical Daily  . enoxaparin (LOVENOX) injection  40 mg Subcutaneous Daily  . phosphorus  250 mg Oral TID  . senna  1 tablet Oral QHS  . timolol  1 drop Both Eyes BID   Continuous Infusions: . sodium chloride Stopped (10/27/19 0153)  . meropenem (MERREM) IV 1 g (10/29/19 1153)     LOS: 4 days    Time spent: 25 mins.    Shawna Clamp, MD [facility]   If 7PM-7AM, please contact night-coverage

## 2019-10-30 DIAGNOSIS — A419 Sepsis, unspecified organism: Secondary | ICD-10-CM | POA: Diagnosis not present

## 2019-10-30 DIAGNOSIS — C61 Malignant neoplasm of prostate: Secondary | ICD-10-CM

## 2019-10-30 DIAGNOSIS — A4151 Sepsis due to Escherichia coli [E. coli]: Secondary | ICD-10-CM

## 2019-10-30 DIAGNOSIS — I129 Hypertensive chronic kidney disease with stage 1 through stage 4 chronic kidney disease, or unspecified chronic kidney disease: Secondary | ICD-10-CM

## 2019-10-30 DIAGNOSIS — Z87891 Personal history of nicotine dependence: Secondary | ICD-10-CM

## 2019-10-30 DIAGNOSIS — N390 Urinary tract infection, site not specified: Secondary | ICD-10-CM | POA: Diagnosis not present

## 2019-10-30 LAB — MAGNESIUM: Magnesium: 1.9 mg/dL (ref 1.7–2.4)

## 2019-10-30 LAB — CULTURE, BLOOD (ROUTINE X 2)
Culture: NO GROWTH
Culture: NO GROWTH

## 2019-10-30 LAB — BASIC METABOLIC PANEL
Anion gap: 10 (ref 5–15)
BUN: 18 mg/dL (ref 8–23)
CO2: 23 mmol/L (ref 22–32)
Calcium: 8.4 mg/dL — ABNORMAL LOW (ref 8.9–10.3)
Chloride: 98 mmol/L (ref 98–111)
Creatinine, Ser: 1.61 mg/dL — ABNORMAL HIGH (ref 0.61–1.24)
GFR, Estimated: 43 mL/min — ABNORMAL LOW (ref >60)
Glucose, Bld: 127 mg/dL — ABNORMAL HIGH (ref 70–99)
Potassium: 3.8 mmol/L (ref 3.5–5.1)
Sodium: 131 mmol/L — ABNORMAL LOW (ref 135–145)

## 2019-10-30 LAB — PHOSPHORUS: Phosphorus: 2.4 mg/dL — ABNORMAL LOW (ref 2.5–4.6)

## 2019-10-30 NOTE — Consult Note (Signed)
[facility]    Reason for Consult: UTI/Pyelo   Referring Physician: Dr. Dwyane Dee  Assessment: [name] is 79yo male with hypertension, CKD III, prostate cancer s/p radiation therapy admitted 10/22 with inability to urinate. He was originally seen in ED 10/16 and prescribed Macrobid x7d for UTI. During this admission, presented with sepsis and lactic acidosis, started on IVF and cefepime. Imaging revealed hydronephrosis, hydroureter, and possible pyelonephritis. BCx NGTD, UCx revealed 80,000 CFU/mL ESBL E. coli and was switched to meropenem. Today is day 3 of meropenem. Goal for 7d total, can switch to po Bactrim tomorrow. Discussed with pharmacy regarding Bactrim and patient's chronic kidney disease, okay to prescribe given short course. Patient to follow-up with urology for further management of Foley, prostate abscess/cancer.  Plan: - Switch to Bactrim tomorrow w/ renal dosing, end date 10/31 - F/u with urology for Foley management and further prostate investigation  Antibiotics: Day 3 meropenem S/p 3d cefepime  HPI: [name] is a 79 y.o. male with hypertension, CKDIII, prostate cancer s/p radiation therapy presenting to [facility] 10/22 with inability to urinate after progressive increasing difficulty over the previous three days. HPI per chart review and interview with patient. Patient was seen 10/16 in ED for UTI and was prescribed Macrobid x7d. Urology successfully placed urinary catheter in ED. Imaging revealed right-sided hydronephrosis and hydroureter with possible pyelonephritis. Per urology, will manage conservatively and attempt voiding trial after a minimum of one week. In addition, CT revealed diffuse prostate enlargement, 2/2 either recurrence of malignancy or abscess. Patient to follow-up with urology for this as well. Today patient states he is feeling better. Denies fevers, chest pain, palpitations, shortness of breath, abdominal pain. Endorses  frustration over enlarged scrotum, which is chronic.  Review of Systems: All other systems reviewed and are negative    Past Medical History:  Diagnosis Date   Anxiety    Arthritis    Bilateral flank pain    DUE TO KIDNEY STONES   Chronic back pain    Dyslipidemia    Eczema    Elevated PSA 01/11/2012   25.77   Frequency of urination    Glaucoma PT STATES RX RAN OUT   Glaucoma    Hematuria    Herniated disc    History of kidney stones    History of radiation therapy 06/07/12-08/02/12   prostate, 7800 cGy/40 sessions/ 5600cGy pelvic lymph nodes/40 sessions   History of shingles    History of TIA (transient ischemic attack) 2010--  NO RESIDUAL   NONE SINCE   Hypertension    Nocturia    Prostate cancer ([place]) 03/2012   Renal calculi BILATERAL    Urgency of urination    Weakness of both legs     Social History   Tobacco Use   Smoking status: Former Smoker    Years: 40.00    Types: Cigarettes    Quit date: 06/10/1990    Years since quitting: 29.4   Smokeless tobacco: Never Used  Substance Use Topics   Alcohol use: No   Drug use: No    Family History  Problem Relation Age of Onset   Hypertension Mother    Stroke Father    Hypertension Sister    Hypertension Brother     Allergies  Allergen Reactions   Tape Other (See Comments)    Redness and blisters/paper tape-not latex   Doxycycline Hyclate Other (See Comments)    Mental changes    Sulfa Antibiotics Hives  Physical Exam: Vitals:   10/30/19 0557 10/30/19 0905  BP: 131/82 123/72  Pulse: 98 96  Resp: 17 18  Temp: 99.6 F (37.6 C) 98.5 F (36.9 C)  SpO2: 97% 96%   General: Pleasant, no acute distress HENT: Normocephalic, atraumatic EYES: anicteric Cardiovascular: Regular rate, rhythm. No m/r/g Respiratory: Clear to auscultation bilaterally GI: Soft, non-tender, non-distended. GU: Scrotal edema. No erythema, tenderness. Extremities: Peripheral pulses 2+.  No peripheral  edema bilaterally. Skin: Warm, dry. No rashes or lesions appreciated. Neuro: Alert, awake, oriented x3  Lab Results  Component Value Date   WBC 8.4 10/28/2019   HGB 10.4 (L) 10/29/2019   HCT 34.2 (L) 10/29/2019   MCV 105.4 (H) 10/28/2019   PLT 203 10/28/2019    Lab Results  Component Value Date   CREATININE 1.61 (H) 10/30/2019   BUN 18 10/30/2019   NA 131 (L) 10/30/2019   K 3.8 10/30/2019   CL 98 10/30/2019   CO2 23 10/30/2019    Lab Results  Component Value Date   ALT 28 10/27/2019   AST 40 10/27/2019   ALKPHOS 60 10/27/2019     Microbiology: Recent Results (from the past 240 hour(s))  Blood culture (routine x 2)     Status: None   Collection Time: 10/25/19  3:10 PM   Specimen: BLOOD  Result Value Ref Range Status   Specimen Description BLOOD RIGHT ANTECUBITAL  Final   Special Requests   Final    BOTTLES DRAWN AEROBIC AND ANAEROBIC Blood Culture results may not be optimal due to an inadequate volume of blood received in culture bottles   Culture   Final    NO GROWTH 5 DAYS Performed at [facility], [place] [address], [place], [place] [address]    Report Status 10/30/2019 FINAL  Final  Blood culture (routine x 2)     Status: None   Collection Time: 10/25/19  3:10 PM   Specimen: BLOOD  Result Value Ref Range Status   Specimen Description BLOOD LEFT ANTECUBITAL  Final   Special Requests   Final    BOTTLES DRAWN AEROBIC AND ANAEROBIC Blood Culture results may not be optimal due to an inadequate volume of blood received in culture bottles   Culture   Final    NO GROWTH 5 DAYS Performed at [facility], [place] [address] [place], [place] [address]    Report Status 10/30/2019 FINAL  Final  Urine Culture     Status: Abnormal   Collection Time: 10/25/19  3:38 PM   Specimen: Urine, Random  Result Value Ref Range Status   Specimen Description URINE, RANDOM  Final   Special Requests   Final    NONE Performed at [facility], [place] [address]  [place], [place] [address]    Culture (A)  Final    80,000 COLONIES/mL ESCHERICHIA COLI Confirmed Extended Spectrum Beta-Lactamase Producer (ESBL).  In bloodstream infections from ESBL organisms, carbapenems are preferred over piperacillin/tazobactam. They are shown to have a lower risk of mortality.    Report Status 10/28/2019 FINAL  Final   Organism ID, Bacteria ESCHERICHIA COLI (A)  Final      Susceptibility   Escherichia coli - MIC*    AMPICILLIN >=32 RESISTANT Resistant     CEFAZOLIN >=64 RESISTANT Resistant     CEFTRIAXONE >=64 RESISTANT Resistant     CIPROFLOXACIN >=4 RESISTANT Resistant     GENTAMICIN 4 SENSITIVE Sensitive     IMIPENEM <=0.25 SENSITIVE Sensitive  NITROFURANTOIN 64 INTERMEDIATE Intermediate     TRIMETH/SULFA <=20 SENSITIVE Sensitive     AMPICILLIN/SULBACTAM >=32 RESISTANT Resistant     PIP/TAZO <=4 SENSITIVE Sensitive     * 80,000 COLONIES/mL ESCHERICHIA COLI  Respiratory Panel by RT PCR (Flu A&B, Covid) - Nasopharyngeal Swab     Status: None   Collection Time: 10/25/19  5:30 PM   Specimen: Nasopharyngeal Swab  Result Value Ref Range Status   SARS Coronavirus 2 by RT PCR NEGATIVE NEGATIVE Final    Comment: (NOTE) SARS-CoV-2 target nucleic acids are NOT DETECTED.  The SARS-CoV-2 RNA is generally detectable in upper respiratoy specimens during the acute phase of infection. The lowest concentration of SARS-CoV-2 viral copies this assay can detect is 131 copies/mL. A negative result does not preclude SARS-Cov-2 infection and should not be used as the sole basis for treatment or other patient management decisions. A negative result may occur with  improper specimen collection/handling, submission of specimen other than nasopharyngeal swab, presence of viral mutation(s) within the areas targeted by this assay, and inadequate number of viral copies (<131 copies/mL). A negative result must be combined with clinical observations, patient history, and epidemiological  information. The expected result is Negative.  Fact Sheet for Patients:  PinkCheek.be  Fact Sheet for Healthcare Providers:  GravelBags.it  This test is no t yet approved or cleared by the [place] FDA and  has been authorized for detection and/or diagnosis of SARS-CoV-2 by FDA under an Emergency Use Authorization (EUA). This EUA will remain  in effect (meaning this test can be used) for the duration of the COVID-19 declaration under Section 564(b)(1) of the Act, 21 U.S.C. section 360bbb-3(b)(1), unless the authorization is terminated or revoked sooner.     Influenza A by PCR NEGATIVE NEGATIVE Final   Influenza B by PCR NEGATIVE NEGATIVE Final    Comment: (NOTE) The Xpert Xpress SARS-CoV-2/FLU/RSV assay is intended as an aid in  the diagnosis of influenza from Nasopharyngeal swab specimens and  should not be used as a sole basis for treatment. Nasal washings and  aspirates are unacceptable for Xpert Xpress SARS-CoV-2/FLU/RSV  testing.  Fact Sheet for Patients: PinkCheek.be  Fact Sheet for Healthcare Providers: GravelBags.it  This test is not yet approved or cleared by the [place] FDA and  has been authorized for detection and/or diagnosis of SARS-CoV-2 by  FDA under an Emergency Use Authorization (EUA). This EUA will remain  in effect (meaning this test can be used) for the duration of the  Covid-19 declaration under Section 564(b)(1) of the Act, 21  U.S.C. section 360bbb-3(b)(1), unless the authorization is  terminated or revoked. Performed at [facility], [place] [address], [place], [place] [address]     Sanjuan Dame, MD Internal Medicine PGY-1 [phone]

## 2019-10-30 NOTE — Care Management Important Message (Signed)
Important Message  Patient Details  Name: [name] MRN: [id number] Date of Birth: March 02, 1940   Medicare Important Message Given:  Yes - Important Message mailed due to current National Emergency  Verbal consent obtained due to current National Emergency  Relationship to patient: Self Contact Name: Amogh Komatsu Call Date: 10/30/19  Time: [place] Phone: [phone] Outcome: No Answer/Busy Important Message mailed to: Patient address on file    Delorse Lek 10/30/2019, 8:41 AM

## 2019-10-30 NOTE — Progress Notes (Signed)
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

## 2019-10-30 NOTE — Progress Notes (Signed)
PROGRESS NOTE    [name]  AYT:[id number] DOB: [birth year] DOA: 10/25/2019 PCP: Patient, No Pcp Per   Brief Narrative: This 79 years old Male with PMH of hypertension, CKD stage III, anxiety disorder, chronic pain syndrome, prostate cancer s/p radiation therapy presenting in the ED with inability to urinate.  Patient has progressively increasing difficulty with urination for last 3 days.  Multiple attempts to place Foley catheter in the ED were unsuccessful. Urology was consulted and patient successfully had urinary catheter.  CT abdomen and pelvis revealed right-sided hydronephrosis and hydroureter with concerns for pyelonephritis.  Patient is started on IV fluids,  IV ceftriaxone and admitted for further evaluation. Urology recommended outpatient management , keep foley catheter for one week, voiding trial as an outpatient. Urine culture grew ESBL, antibiotics changed to meropenum. ID consulted for Antibiotics regimen at discharge  Assessment & Plan:   Principal Problem:   Sepsis secondary to UTI [facility]) Active Problems:   Essential hypertension, benign   Complicated UTI (urinary tract infection)   Acute renal failure superimposed on stage 3 chronic kidney disease (HCC)   Pyelonephritis of right kidney   History of prostate cancer   Lactic acidosis   Macrocytic anemia   Acute urinary retention  Sepsis secondary to UTI/ Pyelonephritis :   Patient presented with multiple sirs criteria in the setting of lactic acidosis and acute kidney injury all thought to be secondary from right-sided pyelonephritis and possible prostatitis with prostatic abscess with secondary sepsis.  Patient has been initiated on intravenous ceftriaxone in the emergency department which was changed to cefepime until urine cultures return  Foley catheter has been graciously placed by urology due to multiple failed attempts.  There is diffuse swelling of the prostate with the possibility of an abscess but  considering patient's known history of prostate malignancy we feel that recurrent prostate mass is just as likely -Urology is following  Hydrating patient aggressively with intravenous isotonic fluids  Blood  Culture no growth so far, urine culture grew ESBL, Antibiotics changed to meropenem.  Monitoring closely for symptomatic improvement.       Urology  recommended outpatient management , keep foley catheter for one week, voiding trial as an outpatient.  .   ID consulted for antibiotics regimen at discharge, awaiting recommendation     Acute renal failure superimposed on stage 3 chronic kidney disease (HCC)  >>>> Improving.   Patient is suffering from acute kidney injury likely secondary to postobstructive uropathy in addition to sepsis.  Foley catheter has been placed by urology in the emergency department to relieve postobstructive uropathy.  Hydrating patient with intravenous isotonic fluids, continuing broad-spectrum intravenous antibiotic therapy.  Strict input and output monitoring  Monitoring renal function electrolytes with serial chemistries     Lactic acidosis >>> Resolved.   Lactic acidosis likely secondary to sepsis with concurrent volume depletion.  Hydrate patient intravenous isotonic fluids while concurrently treating underlying infection  Monitoring serial lactic acid levels to ensure downtrending and resolution.    Acute urinary retention >>>> Improved.   Patient presenting with 3-day history of difficulty with urination.  Emergency room staff was unsuccessful in placing Foley catheter requiring assistance of urology.  Per urology recommendations, Foley catheter will need to remain in place for a minimum of 1 week  Etiology is possibly prostatitis with prostatic abscess versus recurrence of prostate cancer with prostatic mass  Patient will  need outpatient follow-up with urology as well.    Essential hypertension, benign   Continue home  regimen of antihypertensive therapy while monitoring for episodic hypotension in setting of sepsis.    History of prostate cancer   Known history of prostate cancer, diagnosed in 2014 by Dr. Diona Fanti  Patient was treated with radiation therapy in 2014  When asking the patient, he denies any knowledge of having prostate cancer.  CT revealing diffuse prostate enlargement with area of low-attenuation - findings could possibly be secondary to recurrence of malignancy.    Macrocytic anemia  No clinical evidence of bleeding.  Monitoring hemoglobin and hematocrit with serial CBCs  B12 and folate normal. Iron low.  DVT prophylaxis:  Lovenox Code Status:  Full code. Family Communication: No one at bed side. Disposition Plan:  Status is: Inpatient  Remains inpatient appropriate because:Inpatient level of care appropriate due to severity of illness   Dispo: The patient is from: Home              Anticipated d/c is to: Home              Anticipated d/c date is: 10/28              Patient currently is not medically stable to d/c. Ongoing treatment for UTI  requring meropenem.     Consultants:   Urology  Procedures:  Foley catheterization  Antimicrobials:  Anti-infectives (From admission, onward)   Start     Dose/Rate Route Frequency Ordered Stop   10/28/19 1000  meropenem (MERREM) 1 g in sodium chloride 0.9 % 100 mL IVPB        1 g 200 mL/hr over 30 Minutes Intravenous Every 12 hours 10/28/19 0818     10/25/19 2300  ceFEPIme (MAXIPIME) 2 g in sodium chloride 0.9 % 100 mL IVPB  Status:  Discontinued        2 g 200 mL/hr over 30 Minutes Intravenous Every 24 hours 10/25/19 2240 10/28/19 0818   10/25/19 1930  cefTRIAXone (ROCEPHIN) 1 g in sodium chloride 0.9 % 100 mL IVPB  Status:  Discontinued        1 g 200 mL/hr over 30 Minutes Intravenous Every 24 hours 10/25/19 1921 10/25/19 2238      Subjective: Patient was seen and examined at bed side, Overnight events noted,   He reports feeling better, still not had a bowel movement, has significant scrotal edema, states it is getting bigger.  Objective: Vitals:   10/29/19 1815 10/29/19 2140 10/30/19 0557 10/30/19 0905  BP: 114/75 (!) 155/83 131/82 123/72  Pulse: 93 96 98 96  Resp: 18 17 17 18   Temp: 97.7 F (36.5 C) (!) 97.5 F (36.4 C) 99.6 F (37.6 C) 98.5 F (36.9 C)  TempSrc: Oral Oral Oral Oral  SpO2: 100% (!) 75% 97% 96%  Weight:  89.4 kg    Height:        Intake/Output Summary (Last 24 hours) at 10/30/2019 1603 Last data filed at 10/30/2019 1400 Gross per 24 hour  Intake 860 ml  Output 2400 ml  Net -1540 ml   Filed Weights   10/27/19 2058 10/28/19 2122 10/29/19 2140  Weight: 85.1 kg 84.9 kg 89.4 kg    Examination:  General exam: Appears calm and comfortable. Respiratory system: Clear to auscultation. Respiratory effort normal. Cardiovascular system: S1 & S2 heard, RRR. No JVD, murmurs, rubs, gallops or clicks. No pedal edema. Gastrointestinal system: Abdomen is nondistended, soft and nontender. No organomegaly or masses felt. Normal bowel sounds heard. Central nervous system: Alert and oriented. No focal neurological deficits. GU: Scrotal  edema noted. Extremities:  No edema, no cyanosis, no clubbing. Skin: No rashes, lesions or ulcers Psychiatry: Judgement and insight appear normal. Mood & affect appropriate.     Data Reviewed: I have personally reviewed following labs and imaging studies  CBC: Recent Labs  Lab 10/25/19 1330 10/25/19 1330 10/26/19 0631 10/26/19 0818 10/27/19 0248 10/28/19 0337 10/29/19 0203  WBC 21.7*  --  14.5*  --  10.9* 8.4  --   NEUTROABS  --   --  11.8*  --   --   --   --   HGB 10.6*   < > 8.7* 9.8* 8.8* 9.9* 10.4*  HCT 34.4*   < > 28.5* 32.1* 28.0* 31.5* 34.2*  MCV 105.5*  --  106.3*  --  104.1* 105.4*  --   PLT 245  --  174  --  178 203  --    < > = values in this interval not displayed.   Basic Metabolic Panel: Recent Labs  Lab  10/26/19 0631 10/27/19 0248 10/28/19 0337 10/29/19 0203 10/30/19 0201  NA 139 138 138 137 131*  K 3.9 3.7 4.1 3.9 3.8  CL 102 102 102 101 98  CO2 27 25 23 26 23   GLUCOSE 127* 157* 123* 128* 127*  BUN 20 15 14 19 18   CREATININE 2.10* 1.73* 1.64* 1.75* 1.61*  CALCIUM 8.5* 8.7* 8.8* 8.8* 8.4*  MG 2.6* 1.9 1.9 2.1 1.9  PHOS  --  2.2* 2.5 2.3* 2.4*   GFR: Estimated Creatinine Clearance: 40.8 mL/min (A) (by C-G formula based on SCr of 1.61 mg/dL (H)). Liver Function Tests: Recent Labs  Lab 10/25/19 1330 10/26/19 0631 10/27/19 0248  AST 34 28 40  ALT 23 20 28   ALKPHOS 89 66 60  BILITOT 1.1 0.7 0.6  PROT 7.6 6.0* 6.4*  ALBUMIN 2.9* 2.2* 2.2*   Recent Labs  Lab 10/25/19 1330  LIPASE 32   No results for input(s): AMMONIA in the last 168 hours. Coagulation Profile: Recent Labs  Lab 10/25/19 2048 10/26/19 0631  INR 1.4* 1.4*   Cardiac Enzymes: No results for input(s): CKTOTAL, CKMB, CKMBINDEX, TROPONINI in the last 168 hours. BNP (last 3 results) No results for input(s): PROBNP in the last 8760 hours. HbA1C: No results for input(s): HGBA1C in the last 72 hours. CBG: No results for input(s): GLUCAP in the last 168 hours. Lipid Profile: No results for input(s): CHOL, HDL, LDLCALC, TRIG, CHOLHDL, LDLDIRECT in the last 72 hours. Thyroid Function Tests: No results for input(s): TSH, T4TOTAL, FREET4, T3FREE, THYROIDAB in the last 72 hours. Anemia Panel: No results for input(s): VITAMINB12, FOLATE, FERRITIN, TIBC, IRON, RETICCTPCT in the last 72 hours. Sepsis Labs: Recent Labs  Lab 10/25/19 1537 10/26/19 0029 10/26/19 0631  PROCALCITON  --   --  9.47  LATICACIDVEN 2.1* 1.0 0.9    Recent Results (from the past 240 hour(s))  Blood culture (routine x 2)     Status: None   Collection Time: 10/25/19  3:10 PM   Specimen: BLOOD  Result Value Ref Range Status   Specimen Description BLOOD RIGHT ANTECUBITAL  Final   Special Requests   Final    BOTTLES DRAWN AEROBIC AND  ANAEROBIC Blood Culture results may not be optimal due to an inadequate volume of blood received in culture bottles   Culture   Final    NO GROWTH 5 DAYS Performed at [facility], [place] [address] [place], [place] [address]    Report Status 10/30/2019 FINAL  Final  Blood culture (routine x 2)     Status: None   Collection Time: 10/25/19  3:10 PM   Specimen: BLOOD  Result Value Ref Range Status   Specimen Description BLOOD LEFT ANTECUBITAL  Final   Special Requests   Final    BOTTLES DRAWN AEROBIC AND ANAEROBIC Blood Culture results may not be optimal due to an inadequate volume of blood received in culture bottles   Culture   Final    NO GROWTH 5 DAYS Performed at [facility], [place] [address], [place], [place] [address]    Report Status 10/30/2019 FINAL  Final  Urine Culture     Status: Abnormal   Collection Time: 10/25/19  3:38 PM   Specimen: Urine, Random  Result Value Ref Range Status   Specimen Description URINE, RANDOM  Final   Special Requests   Final    NONE Performed at [facility], [place] [address] [place], [place] [address]    Culture (A)  Final    80,000 COLONIES/mL ESCHERICHIA COLI Confirmed Extended Spectrum Beta-Lactamase Producer (ESBL).  In bloodstream infections from ESBL organisms, carbapenems are preferred over piperacillin/tazobactam. They are shown to have a lower risk of mortality.    Report Status 10/28/2019 FINAL  Final   Organism ID, Bacteria ESCHERICHIA COLI (A)  Final      Susceptibility   Escherichia coli - MIC*    AMPICILLIN >=32 RESISTANT Resistant     CEFAZOLIN >=64 RESISTANT Resistant     CEFTRIAXONE >=64 RESISTANT Resistant     CIPROFLOXACIN >=4 RESISTANT Resistant     GENTAMICIN 4 SENSITIVE Sensitive     IMIPENEM <=0.25 SENSITIVE Sensitive     NITROFURANTOIN 64 INTERMEDIATE Intermediate     TRIMETH/SULFA <=20 SENSITIVE Sensitive     AMPICILLIN/SULBACTAM >=32 RESISTANT Resistant     PIP/TAZO <=4 SENSITIVE Sensitive     *  80,000 COLONIES/mL ESCHERICHIA COLI  Respiratory Panel by RT PCR (Flu A&B, Covid) - Nasopharyngeal Swab     Status: None   Collection Time: 10/25/19  5:30 PM   Specimen: Nasopharyngeal Swab  Result Value Ref Range Status   SARS Coronavirus 2 by RT PCR NEGATIVE NEGATIVE Final    Comment: (NOTE) SARS-CoV-2 target nucleic acids are NOT DETECTED.  The SARS-CoV-2 RNA is generally detectable in upper respiratoy specimens during the acute phase of infection. The lowest concentration of SARS-CoV-2 viral copies this assay can detect is 131 copies/mL. A negative result does not preclude SARS-Cov-2 infection and should not be used as the sole basis for treatment or other patient management decisions. A negative result may occur with  improper specimen collection/handling, submission of specimen other than nasopharyngeal swab, presence of viral mutation(s) within the areas targeted by this assay, and inadequate number of viral copies (<131 copies/mL). A negative result must be combined with clinical observations, patient history, and epidemiological information. The expected result is Negative.  Fact Sheet for Patients:  PinkCheek.be  Fact Sheet for Healthcare Providers:  GravelBags.it  This test is no t yet approved or cleared by the [place] FDA and  has been authorized for detection and/or diagnosis of SARS-CoV-2 by FDA under an Emergency Use Authorization (EUA). This EUA will remain  in effect (meaning this test can be used) for the duration of the COVID-19 declaration under Section 564(b)(1) of the Act, 21 U.S.C. section 360bbb-3(b)(1), unless the authorization is terminated or revoked sooner.     Influenza A by PCR NEGATIVE NEGATIVE Final  Influenza B by PCR NEGATIVE NEGATIVE Final    Comment: (NOTE) The Xpert Xpress SARS-CoV-2/FLU/RSV assay is intended as an aid in  the diagnosis of influenza from Nasopharyngeal swab  specimens and  should not be used as a sole basis for treatment. Nasal washings and  aspirates are unacceptable for Xpert Xpress SARS-CoV-2/FLU/RSV  testing.  Fact Sheet for Patients: PinkCheek.be  Fact Sheet for Healthcare Providers: GravelBags.it  This test is not yet approved or cleared by the [place] FDA and  has been authorized for detection and/or diagnosis of SARS-CoV-2 by  FDA under an Emergency Use Authorization (EUA). This EUA will remain  in effect (meaning this test can be used) for the duration of the  Covid-19 declaration under Section 564(b)(1) of the Act, 21  U.S.C. section 360bbb-3(b)(1), unless the authorization is  terminated or revoked. Performed at [facility], [place] [address], [place], [place] [address]      Radiology Studies: No results found. Scheduled Meds: . amLODipine  10 mg Oral Daily  . atenolol  50 mg Oral Daily  . Chlorhexidine Gluconate Cloth  6 each Topical Daily  . enoxaparin (LOVENOX) injection  40 mg Subcutaneous Daily  . phosphorus  250 mg Oral TID  . senna  1 tablet Oral QHS  . timolol  1 drop Both Eyes BID   Continuous Infusions: . sodium chloride Stopped (10/27/19 0153)  . meropenem (MERREM) IV 1 g (10/30/19 1012)     LOS: 5 days    Time spent: 25 mins.    Shawna Clamp, MD [facility]   If 7PM-7AM, please contact night-coverage

## 2019-10-30 NOTE — Plan of Care (Signed)
  Problem: Health Behavior/Discharge Planning: Goal: Ability to manage health-related needs will improve Outcome: Progressing   Problem: Elimination: Goal: Will not experience complications related to bowel motility Outcome: Progressing   

## 2019-10-31 DIAGNOSIS — A419 Sepsis, unspecified organism: Secondary | ICD-10-CM | POA: Diagnosis not present

## 2019-10-31 DIAGNOSIS — N390 Urinary tract infection, site not specified: Secondary | ICD-10-CM | POA: Diagnosis not present

## 2019-10-31 LAB — CBC
HCT: 29.3 % — ABNORMAL LOW (ref 39.0–52.0)
Hemoglobin: 9.1 g/dL — ABNORMAL LOW (ref 13.0–17.0)
MCH: 32.3 pg (ref 26.0–34.0)
MCHC: 31.1 g/dL (ref 30.0–36.0)
MCV: 103.9 fL — ABNORMAL HIGH (ref 80.0–100.0)
Platelets: 189 10*3/uL (ref 150–400)
RBC: 2.82 MIL/uL — ABNORMAL LOW (ref 4.22–5.81)
RDW: 12.4 % (ref 11.5–15.5)
WBC: 6 10*3/uL (ref 4.0–10.5)
nRBC: 0 % (ref 0.0–0.2)

## 2019-10-31 LAB — BASIC METABOLIC PANEL
Anion gap: 10 (ref 5–15)
BUN: 17 mg/dL (ref 8–23)
CO2: 24 mmol/L (ref 22–32)
Calcium: 8.8 mg/dL — ABNORMAL LOW (ref 8.9–10.3)
Chloride: 103 mmol/L (ref 98–111)
Creatinine, Ser: 1.59 mg/dL — ABNORMAL HIGH (ref 0.61–1.24)
GFR, Estimated: 44 mL/min — ABNORMAL LOW (ref >60)
Glucose, Bld: 118 mg/dL — ABNORMAL HIGH (ref 70–99)
Potassium: 4.1 mmol/L (ref 3.5–5.1)
Sodium: 137 mmol/L (ref 135–145)

## 2019-10-31 LAB — PHOSPHORUS: Phosphorus: 3.4 mg/dL (ref 2.5–4.6)

## 2019-10-31 LAB — MAGNESIUM: Magnesium: 2 mg/dL (ref 1.7–2.4)

## 2019-10-31 NOTE — TOC Initial Note (Signed)
Transition of Care [facility]) - Initial/Assessment Note    Patient Details  Name: [name] MRN: [id number] Date of Birth: [birth year]  Transition [facility]) CM/SW Contact:    Bartholomew Crews, RN Phone Number: [phone] 10/31/2019, 4:29 PM  Clinical Narrative:                  Spoke with patient at the bedside. PTA home with spouse. Independent. No DME or [place] services in place. Feels that he may need a walker moving forward. Discussed PT evaluation and pending recommendations. His goal would be to be back to his independence. States that if he has transition needs that he will coordinate NCM speaking with his wife. PCP is Docia Barrier, PA-C at [facility].   TOC following for transition needs.   Expected Discharge Plan: [place] Barriers to Discharge: Continued Medical Work up   Patient Goals and CMS Choice Patient states their goals for this hospitalization and ongoing recovery are:: return home with wife CMS Medicare.gov Compare Post Acute Care list provided to:: Patient Choice offered to / list presented to : Patient  Expected Discharge Plan and Services Expected Discharge Plan: [place] In-house Referral: NA Discharge Planning Services: CM Consult Post Acute Care Choice: Home Health, Durable Medical Equipment Living arrangements for the past 2 months: Single Family Home                                      Prior Living Arrangements/Services Living arrangements for the past 2 months: Single Family Home Lives with:: Self, Spouse Patient language and need for interpreter reviewed:: Yes Do you feel safe going back to the place where you live?: Yes      Need for Family Participation in Patient Care: Yes (Comment) Care giver support system in place?: Yes (comment)   Criminal Activity/Legal Involvement Pertinent to Current Situation/Hospitalization: No - Comment as needed  Activities of Daily Living Home  Assistive Devices/Equipment: Walker (specify type) ADL Screening (condition at time of admission) Patient's cognitive ability adequate to safely complete daily activities?: No Is the patient deaf or have difficulty hearing?: No Does the patient have difficulty seeing, even when wearing glasses/contacts?: No Does the patient have difficulty concentrating, remembering, or making decisions?: Yes Patient able to express need for assistance with ADLs?: Yes Does the patient have difficulty dressing or bathing?: Yes Independently performs ADLs?: No Communication: Needs assistance Is this a change from baseline?: Pre-admission baseline Dressing (OT): Needs assistance Is this a change from baseline?: Pre-admission baseline Grooming: Dependent Is this a change from baseline?: Pre-admission baseline Feeding: Needs assistance Is this a change from baseline?: Pre-admission baseline Bathing: Dependent Is this a change from baseline?: Pre-admission baseline Toileting: Dependent Is this a change from baseline?: Pre-admission baseline In/Out Bed: Needs assistance Is this a change from baseline?: Pre-admission baseline Walks in Home: Dependent Is this a change from baseline?: Pre-admission baseline Does the patient have difficulty walking or climbing stairs?: Yes Weakness of Legs: Both Weakness of Arms/Hands: None  Permission Sought/Granted                  Emotional Assessment Appearance:: Appears stated age Attitude/Demeanor/Rapport: Engaged Affect (typically observed): Accepting Orientation: : Oriented to Self, Oriented to  Time, Oriented to Place, Oriented to Situation Alcohol / Substance Use: Not Applicable Psych Involvement: No (comment)  Admission diagnosis:  Sepsis secondary  to UTI ([place]) [A41.9, N39.0] Patient Active Problem List   Diagnosis Date Noted  . Lactic acidosis 10/26/2019  . Macrocytic anemia 10/26/2019  . Acute urinary retention 10/26/2019  . Sepsis secondary to  UTI ([place]) 10/25/2019  . Kidney stones 10/19/2019  . History of prostate cancer 10/19/2019  . Pyelonephritis of right kidney 06/29/2017  . Complicated UTI (urinary tract infection) 06/26/2017  . Acute renal failure superimposed on stage 3 chronic kidney disease ([place]) 06/26/2017  . TIA (transient ischemic attack) 06/26/2017  . Anxiety 06/26/2017  . Prostate cancer ([place]) 05/01/2012  . Other and unspecified hyperlipidemia 04/12/2012  . Chest pain, unspecified 04/12/2012  . 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 Essential hypertension, benign 04/11/2012   PCP:  Milford Cage, PA Pharmacy:   [facility] [place], [place] [facility] [address] Phone: [phone] Fax: [phone]     Social Determinants of Health (SDOH) Interventions    Readmission Risk Interventions Readmission Risk Prevention Plan 10/31/2019  Transportation Screening Complete  Some recent data might be hidden

## 2019-10-31 NOTE — Progress Notes (Signed)
PROGRESS NOTE    [name]  PQZ:[id number]  DOB: 31-Aug-1940  DOA: 10/25/2019 PCP: Milford Cage, PA Outpatient Specialists:   Hospital course:  79 year old man with HTN, CKD 3, chronic pain syndrome and prostate cancer status post XRT who was admitted with urinary retention.  Urologist successfully placed a catheter however CT of abdomen pelvis revealed right hydronephrosis and hydroureter with stranding concerning for pyelonephritis.  Urine has grown out ESBL and patient is now on meropenem.  Subjective:  Patient is without any complaints.  Notes he is quite sleepy.  Denies back pain abdominal pain fevers or chills.   Objective: Vitals:   10/31/19 0952 10/31/19 1027 10/31/19 1029 10/31/19 1629  BP: 120/73  111/82 125/78  Pulse: (!) 114 (!) 108 (!) 108 85  Resp: 18  18 18   Temp: 97.9 F (36.6 C)  98.9 F (37.2 C) 98.2 F (36.8 C)  TempSrc: Oral  Oral Oral  SpO2: 96% 95% 97% 98%  Weight:      Height:        Intake/Output Summary (Last 24 hours) at 10/31/2019 1720 Last data filed at 10/31/2019 1300 Gross per 24 hour  Intake 460 ml  Output 700 ml  Net -240 ml   Filed Weights   10/28/19 2122 10/29/19 2140 10/30/19 2054  Weight: 84.9 kg 89.4 kg 88 kg     Exam:  General: Sleepy gentleman who is easily arousable by voice alone lying in bed no acute distress Eyes: sclera anicteric, conjuctiva mild injection bilaterally CVS: S1-S2, regular  Respiratory:  decreased air entry bilaterally secondary to decreased inspiratory effort, rales at bases  GI: NABS, soft, minimal tenderness to light palpation right mid quadrant, minimal to no CVA tenderness. LE: No edema.  Neuro: A/O x 3, Moving all extremities equally with normal strength, CN 3-12 intact, grossly nonfocal.  Psych: patient is logical and coherent, judgement and insight appear normal, mood and affect appropriate to situation.   Assessment & Plan:   Pyelonephritis SIRS has resolved with treatment  of infection and IV fluid resuscitation. Continue meropenem for ESBL  Urinary retention Foley to be kept in place for 1 week and then voiding trial prior to removing Foley as it was difficult to put in. Apparently there is some concern for possible prostatic abscess versus recurrence of prostate cancer with prostatic mass. Urology will see patient as an outpatient in follow-up  Acute on chronic renal failure. Creatinine is improving down from 2 to 1.6 today.   DVT prophylaxis: Lovenox Code Status: Full Family Communication: None Disposition Plan:   Patient is from: Home  Anticipated Discharge Location: Home  Barriers to Discharge: Ongoing meropenem  Is patient medically stable for Discharge: Not yet   Consultants:  Urology  Procedures:  None  Antimicrobials:  Meropenem   Data Reviewed:  Basic Metabolic Panel: Recent Labs  Lab 10/27/19 0248 10/28/19 0337 10/29/19 0203 10/30/19 0201 10/31/19 0115  NA 138 138 137 131* 137  K 3.7 4.1 3.9 3.8 4.1  CL 102 102 101 98 103  CO2 25 23 26 23 24   GLUCOSE 157* 123* 128* 127* 118*  BUN 15 14 19 18 17   CREATININE 1.73* 1.64* 1.75* 1.61* 1.59*  CALCIUM 8.7* 8.8* 8.8* 8.4* 8.8*  MG 1.9 1.9 2.1 1.9 2.0  PHOS 2.2* 2.5 2.3* 2.4* 3.4   Liver Function Tests: Recent Labs  Lab 10/25/19 1330 10/26/19 0631 10/27/19 0248  AST 34 28 40  ALT 23 20 28   ALKPHOS 89 66  60  BILITOT 1.1 0.7 0.6  PROT 7.6 6.0* 6.4*  ALBUMIN 2.9* 2.2* 2.2*   Recent Labs  Lab 10/25/19 1330  LIPASE 32   No results for input(s): AMMONIA in the last 168 hours. CBC: Recent Labs  Lab 10/25/19 1330 10/25/19 1330 10/26/19 0631 10/26/19 0631 10/26/19 0818 10/27/19 0248 10/28/19 0337 10/29/19 0203 10/31/19 0115  WBC 21.7*  --  14.5*  --   --  10.9* 8.4  --  6.0  NEUTROABS  --   --  11.8*  --   --   --   --   --   --   HGB 10.6*   < > 8.7*   < > 9.8* 8.8* 9.9* 10.4* 9.1*  HCT 34.4*   < > 28.5*   < > 32.1* 28.0* 31.5* 34.2* 29.3*  MCV 105.5*   --  106.3*  --   --  104.1* 105.4*  --  103.9*  PLT 245  --  174  --   --  178 203  --  189   < > = values in this interval not displayed.   Cardiac Enzymes: No results for input(s): CKTOTAL, CKMB, CKMBINDEX, TROPONINI in the last 168 hours. BNP (last 3 results) No results for input(s): PROBNP in the last 8760 hours. CBG: No results for input(s): GLUCAP in the last 168 hours.  Recent Results (from the past 240 hour(s))  Blood culture (routine x 2)     Status: None   Collection Time: 10/25/19  3:10 PM   Specimen: BLOOD  Result Value Ref Range Status   Specimen Description BLOOD RIGHT ANTECUBITAL  Final   Special Requests   Final    BOTTLES DRAWN AEROBIC AND ANAEROBIC Blood Culture results may not be optimal due to an inadequate volume of blood received in culture bottles   Culture   Final    NO GROWTH 5 DAYS Performed at [facility], [place] [address] [place], [place] [address]    Report Status 10/30/2019 FINAL  Final  Blood culture (routine x 2)     Status: None   Collection Time: 10/25/19  3:10 PM   Specimen: BLOOD  Result Value Ref Range Status   Specimen Description BLOOD LEFT ANTECUBITAL  Final   Special Requests   Final    BOTTLES DRAWN AEROBIC AND ANAEROBIC Blood Culture results may not be optimal due to an inadequate volume of blood received in culture bottles   Culture   Final    NO GROWTH 5 DAYS Performed at [facility], [place] [address], [place], [place] [address]    Report Status 10/30/2019 FINAL  Final  Urine Culture     Status: Abnormal   Collection Time: 10/25/19  3:38 PM   Specimen: Urine, Random  Result Value Ref Range Status   Specimen Description URINE, RANDOM  Final   Special Requests   Final    NONE Performed at [facility], [place] [address], [place], [place] [address]    Culture (A)  Final    80,000 COLONIES/mL ESCHERICHIA COLI Confirmed Extended Spectrum Beta-Lactamase Producer (ESBL).  In bloodstream infections from ESBL organisms,  carbapenems are preferred over piperacillin/tazobactam. They are shown to have a lower risk of mortality.    Report Status 10/28/2019 FINAL  Final   Organism ID, Bacteria ESCHERICHIA COLI (A)  Final      Susceptibility   Escherichia coli - MIC*    AMPICILLIN >=32 RESISTANT Resistant  CEFAZOLIN >=64 RESISTANT Resistant     CEFTRIAXONE >=64 RESISTANT Resistant     CIPROFLOXACIN >=4 RESISTANT Resistant     GENTAMICIN 4 SENSITIVE Sensitive     IMIPENEM <=0.25 SENSITIVE Sensitive     NITROFURANTOIN 64 INTERMEDIATE Intermediate     TRIMETH/SULFA <=20 SENSITIVE Sensitive     AMPICILLIN/SULBACTAM >=32 RESISTANT Resistant     PIP/TAZO <=4 SENSITIVE Sensitive     * 80,000 COLONIES/mL ESCHERICHIA COLI  Respiratory Panel by RT PCR (Flu A&B, Covid) - Nasopharyngeal Swab     Status: None   Collection Time: 10/25/19  5:30 PM   Specimen: Nasopharyngeal Swab  Result Value Ref Range Status   SARS Coronavirus 2 by RT PCR NEGATIVE NEGATIVE Final    Comment: (NOTE) SARS-CoV-2 target nucleic acids are NOT DETECTED.  The SARS-CoV-2 RNA is generally detectable in upper respiratoy specimens during the acute phase of infection. The lowest concentration of SARS-CoV-2 viral copies this assay can detect is 131 copies/mL. A negative result does not preclude SARS-Cov-2 infection and should not be used as the sole basis for treatment or other patient management decisions. A negative result may occur with  improper specimen collection/handling, submission of specimen other than nasopharyngeal swab, presence of viral mutation(s) within the areas targeted by this assay, and inadequate number of viral copies (<131 copies/mL). A negative result must be combined with clinical observations, patient history, and epidemiological information. The expected result is Negative.  Fact Sheet for Patients:  PinkCheek.be  Fact Sheet for Healthcare Providers:    GravelBags.it  This test is no t yet approved or cleared by the [place] FDA and  has been authorized for detection and/or diagnosis of SARS-CoV-2 by FDA under an Emergency Use Authorization (EUA). This EUA will remain  in effect (meaning this test can be used) for the duration of the COVID-19 declaration under Section 564(b)(1) of the Act, 21 U.S.C. section 360bbb-3(b)(1), unless the authorization is terminated or revoked sooner.     Influenza A by PCR NEGATIVE NEGATIVE Final   Influenza B by PCR NEGATIVE NEGATIVE Final    Comment: (NOTE) The Xpert Xpress SARS-CoV-2/FLU/RSV assay is intended as an aid in  the diagnosis of influenza from Nasopharyngeal swab specimens and  should not be used as a sole basis for treatment. Nasal washings and  aspirates are unacceptable for Xpert Xpress SARS-CoV-2/FLU/RSV  testing.  Fact Sheet for Patients: PinkCheek.be  Fact Sheet for Healthcare Providers: GravelBags.it  This test is not yet approved or cleared by the [place] FDA and  has been authorized for detection and/or diagnosis of SARS-CoV-2 by  FDA under an Emergency Use Authorization (EUA). This EUA will remain  in effect (meaning this test can be used) for the duration of the  Covid-19 declaration under Section 564(b)(1) of the Act, 21  U.S.C. section 360bbb-3(b)(1), unless the authorization is  terminated or revoked. Performed at [facility], [place] [address], [place], [place] [address]       Studies: No results found.   Scheduled Meds: . amLODipine  10 mg Oral Daily  . atenolol  50 mg Oral Daily  . Chlorhexidine Gluconate Cloth  6 each Topical Daily  . enoxaparin (LOVENOX) injection  40 mg Subcutaneous Daily  . senna  1 tablet Oral QHS  . timolol  1 drop Both Eyes BID   Continuous Infusions: . sodium chloride Stopped (10/27/19 0153)    Principal Problem:   Sepsis  secondary to UTI [facility]) Active Problems:   Essential  hypertension, benign   Complicated UTI (urinary tract infection)   Acute renal failure superimposed on stage 3 chronic kidney disease ([place])   Pyelonephritis of right kidney   History of prostate cancer   Lactic acidosis   Macrocytic anemia   Acute urinary retention     Vashti Hey, [facility]  If 7PM-7AM, please contact night-coverage [email or web address] Password [email or web address] 10/31/2019, 5:20 PM    LOS: 6 days

## 2019-11-01 DIAGNOSIS — N390 Urinary tract infection, site not specified: Secondary | ICD-10-CM | POA: Diagnosis not present

## 2019-11-01 DIAGNOSIS — A419 Sepsis, unspecified organism: Secondary | ICD-10-CM | POA: Diagnosis not present

## 2019-11-01 MED ORDER — SODIUM CHLORIDE 0.9 % IV SOLN
1.0000 g | Freq: Two times a day (BID) | INTRAVENOUS | Status: AC
  Administered 2019-11-01 – 2019-11-02 (×4): 1 g via INTRAVENOUS
  Filled 2019-11-01 (×4): qty 1

## 2019-11-01 MED ORDER — SULFAMETHOXAZOLE-TRIMETHOPRIM 400-80 MG PO TABS: 1.0000 | ORAL_TABLET | Freq: Two times a day (BID) | ORAL | Status: DC

## 2019-11-01 NOTE — Progress Notes (Addendum)
PROGRESS NOTE    [name]  HEN:[id number]  DOB: 04-27-1940  DOA: 10/25/2019 PCP: Milford Cage, PA Outpatient Specialists:   Hospital course:  79 year old man with HTN, CKD 3, chronic pain syndrome and prostate cancer status post XRT who was admitted with urinary retention.  Urologist successfully placed a catheter however CT of abdomen pelvis revealed right hydronephrosis and hydroureter with stranding concerning for pyelonephritis.  Urine has grown out ESBL and patient is now on meropenem.  Subjective:  Patient states he is quite tired.  Does not feel like he is ready to go home, thinks he needs time for "the bacteria to leave my body".  Patient does admit to not being able to sleep very well and being rather unrested.  Long discussion with patient and his wife today about plans for Foley catheter need for voiding trial as well as previous plans for discharge on Bactrim.  However it appears that patient has a sulfa allergy so Bactrim was never started.  Discussed again with ID, will restart meropenem and complete course to finish on Sunday 10/31.  Patient's wife is relieved that he will be in house for IV antibiotics until Sunday.   Objective: Vitals:   10/31/19 1629 10/31/19 2107 11/01/19 0433 11/01/19 0910  BP: 125/78 (!) 109/57 129/71 112/67  Pulse: 85 100 96 100  Resp: 18 18 16 16   Temp: 98.2 F (36.8 C) 98 F (36.7 C) 98.4 F (36.9 C) 98.3 F (36.8 C)  TempSrc: Oral   Oral  SpO2: 98% 99% 100% 98%  Weight:      Height:        Intake/Output Summary (Last 24 hours) at 11/01/2019 1650 Last data filed at 11/01/2019 0930 Gross per 24 hour  Intake 720 ml  Output 650 ml  Net 70 ml   Filed Weights   10/28/19 2122 10/29/19 2140 10/30/19 2054  Weight: 84.9 kg 89.4 kg 88 kg     Exam:  General: Articulate and friendly patient lying in bed in no apparent distress with attentive wife at bedside. Eyes: sclera anicteric, conjuctiva mild injection  bilaterally CVS: S1-S2, regular  Respiratory:  decreased air entry bilaterally secondary to decreased inspiratory effort, rales at bases  GI: NABS, soft, minimal tenderness to light palpation right mid quadrant, minimal to no CVA tenderness. LE: No edema.  Neuro: A/O x 3, Moving all extremities equally with normal strength, CN 3-12 intact, grossly nonfocal.  Psych: patient is logical and coherent, judgement and insight appear normal, mood and affect appropriate to situation.   Assessment & Plan:   Pyelonephritis SIRS has resolved with treatment of infection and IV fluid resuscitation. Per ID note on 10/30/2019, previous plan had been to discontinue meropenem and start patient on oral Bactrim for discharge on oral Bactrim.  However it appears that oral Bactrim was never started due to sulfa allergy. Will restart meropenem today, Discussed again with ID who recommend continuing meropenem until 11/03/2019 when he can be discharged home off antibiotics.  Urinary retention Foley had to be placed by urology as it was a difficult placement in ED. Plan is to keep Foley in place for 7 to 10 days and follow-up with urology for voiding trial. Apparently there is some concern for possible prostatic abscess versus recurrence of prostate cancer with prostatic mass. Urology will see patient as an outpatient in follow-up  Acute on chronic renal failure. Creatinine is improved, seems to have stabilized around 1.6, baseline appears 1.9.  Disposition Patient is to  go home with wife who is a nurse's aide. He has 2 daughters 1 of whom is an Therapist, sports the other is an LPN. They decline need for RN or home health aide Patient will get home PT, order already placed.   DVT prophylaxis: Lovenox Code Status: Full Family Communication: Spoke at length with patient's wife about plans for antibiotic treatment and need for urology outpatient follow-up with voiding trial. Disposition Plan:   Patient is from: Home   Anticipated Discharge Location: Home  Barriers to Discharge: Ongoing meropenem  Is patient medically stable for Discharge: Not yet, should be discharged 11/03/2019   Consultants:  Urology  Procedures:  None  Antimicrobials:  Meropenem   Data Reviewed:  Basic Metabolic Panel: Recent Labs  Lab 10/27/19 0248 10/28/19 0337 10/29/19 0203 10/30/19 0201 10/31/19 0115  NA 138 138 137 131* 137  K 3.7 4.1 3.9 3.8 4.1  CL 102 102 101 98 103  CO2 25 23 26 23 24   GLUCOSE 157* 123* 128* 127* 118*  BUN 15 14 19 18 17   CREATININE 1.73* 1.64* 1.75* 1.61* 1.59*  CALCIUM 8.7* 8.8* 8.8* 8.4* 8.8*  MG 1.9 1.9 2.1 1.9 2.0  PHOS 2.2* 2.5 2.3* 2.4* 3.4   Liver Function Tests: Recent Labs  Lab 10/26/19 0631 10/27/19 0248  AST 28 40  ALT 20 28  ALKPHOS 66 60  BILITOT 0.7 0.6  PROT 6.0* 6.4*  ALBUMIN 2.2* 2.2*   No results for input(s): LIPASE, AMYLASE in the last 168 hours. No results for input(s): AMMONIA in the last 168 hours. CBC: Recent Labs  Lab 10/26/19 0631 10/26/19 0631 10/26/19 0818 10/27/19 0248 10/28/19 0337 10/29/19 0203 10/31/19 0115  WBC 14.5*  --   --  10.9* 8.4  --  6.0  NEUTROABS 11.8*  --   --   --   --   --   --   HGB 8.7*   < > 9.8* 8.8* 9.9* 10.4* 9.1*  HCT 28.5*   < > 32.1* 28.0* 31.5* 34.2* 29.3*  MCV 106.3*  --   --  104.1* 105.4*  --  103.9*  PLT 174  --   --  178 203  --  189   < > = values in this interval not displayed.   Cardiac Enzymes: No results for input(s): CKTOTAL, CKMB, CKMBINDEX, TROPONINI in the last 168 hours. BNP (last 3 results) No results for input(s): PROBNP in the last 8760 hours. CBG: No results for input(s): GLUCAP in the last 168 hours.  Recent Results (from the past 240 hour(s))  Blood culture (routine x 2)     Status: None   Collection Time: 10/25/19  3:10 PM   Specimen: BLOOD  Result Value Ref Range Status   Specimen Description BLOOD RIGHT ANTECUBITAL  Final   Special Requests   Final    BOTTLES DRAWN  AEROBIC AND ANAEROBIC Blood Culture results may not be optimal due to an inadequate volume of blood received in culture bottles   Culture   Final    NO GROWTH 5 DAYS Performed at [facility], [place] [address] [place], [place] [address]    Report Status 10/30/2019 FINAL  Final  Blood culture (routine x 2)     Status: None   Collection Time: 10/25/19  3:10 PM   Specimen: BLOOD  Result Value Ref Range Status   Specimen Description BLOOD LEFT ANTECUBITAL  Final   Special Requests   Final    BOTTLES DRAWN AEROBIC AND  ANAEROBIC Blood Culture results may not be optimal due to an inadequate volume of blood received in culture bottles   Culture   Final    NO GROWTH 5 DAYS Performed at [place] [address], [place], [place] [address]    Report Status 10/30/2019 FINAL  Final  Urine Culture     Status: Abnormal   Collection Time: 10/25/19  3:38 PM   Specimen: Urine, Random  Result Value Ref Range Status   Specimen Description URINE, RANDOM  Final   Special Requests   Final    NONE Performed at [facility], [place] [address], [place], [place] [address]    Culture (A)  Final    80,000 COLONIES/mL ESCHERICHIA COLI Confirmed Extended Spectrum Beta-Lactamase Producer (ESBL).  In bloodstream infections from ESBL organisms, carbapenems are preferred over piperacillin/tazobactam. They are shown to have a lower risk of mortality.    Report Status 10/28/2019 FINAL  Final   Organism ID, Bacteria ESCHERICHIA COLI (A)  Final      Susceptibility   Escherichia coli - MIC*    AMPICILLIN >=32 RESISTANT Resistant     CEFAZOLIN >=64 RESISTANT Resistant     CEFTRIAXONE >=64 RESISTANT Resistant     CIPROFLOXACIN >=4 RESISTANT Resistant     GENTAMICIN 4 SENSITIVE Sensitive     IMIPENEM <=0.25 SENSITIVE Sensitive     NITROFURANTOIN 64 INTERMEDIATE Intermediate     TRIMETH/SULFA <=20 SENSITIVE Sensitive     AMPICILLIN/SULBACTAM >=32 RESISTANT Resistant     PIP/TAZO <=4 SENSITIVE  Sensitive     * 80,000 COLONIES/mL ESCHERICHIA COLI  Respiratory Panel by RT PCR (Flu A&B, Covid) - Nasopharyngeal Swab     Status: None   Collection Time: 10/25/19  5:30 PM   Specimen: Nasopharyngeal Swab  Result Value Ref Range Status   SARS Coronavirus 2 by RT PCR NEGATIVE NEGATIVE Final    Comment: (NOTE) SARS-CoV-2 target nucleic acids are NOT DETECTED.  The SARS-CoV-2 RNA is generally detectable in upper respiratoy specimens during the acute phase of infection. The lowest concentration of SARS-CoV-2 viral copies this assay can detect is 131 copies/mL. A negative result does not preclude SARS-Cov-2 infection and should not be used as the sole basis for treatment or other patient management decisions. A negative result may occur with  improper specimen collection/handling, submission of specimen other than nasopharyngeal swab, presence of viral mutation(s) within the areas targeted by this assay, and inadequate number of viral copies (<131 copies/mL). A negative result must be combined with clinical observations, patient history, and epidemiological information. The expected result is Negative.  Fact Sheet for Patients:  PinkCheek.be  Fact Sheet for Healthcare Providers:  GravelBags.it  This test is no t yet approved or cleared by the [place] FDA and  has been authorized for detection and/or diagnosis of SARS-CoV-2 by FDA under an Emergency Use Authorization (EUA). This EUA will remain  in effect (meaning this test can be used) for the duration of the COVID-19 declaration under Section 564(b)(1) of the Act, 21 U.S.C. section 360bbb-3(b)(1), unless the authorization is terminated or revoked sooner.     Influenza A by PCR NEGATIVE NEGATIVE Final   Influenza B by PCR NEGATIVE NEGATIVE Final    Comment: (NOTE) The Xpert Xpress SARS-CoV-2/FLU/RSV assay is intended as an aid in  the diagnosis of influenza from  Nasopharyngeal swab specimens and  should not be used as a sole basis for treatment. Nasal washings and  aspirates are unacceptable for Xpert  Xpress SARS-CoV-2/FLU/RSV  testing.  Fact Sheet for Patients: PinkCheek.be  Fact Sheet for Healthcare Providers: GravelBags.it  This test is not yet approved or cleared by the [place] FDA and  has been authorized for detection and/or diagnosis of SARS-CoV-2 by  FDA under an Emergency Use Authorization (EUA). This EUA will remain  in effect (meaning this test can be used) for the duration of the  Covid-19 declaration under Section 564(b)(1) of the Act, 21  U.S.C. section 360bbb-3(b)(1), unless the authorization is  terminated or revoked. Performed at [facility], [place] [address], [place], [place] [address]       Studies: No results found.   Scheduled Meds: . amLODipine  10 mg Oral Daily  . atenolol  50 mg Oral Daily  . Chlorhexidine Gluconate Cloth  6 each Topical Daily  . enoxaparin (LOVENOX) injection  40 mg Subcutaneous Daily  . senna  1 tablet Oral QHS  . timolol  1 drop Both Eyes BID   Continuous Infusions: . sodium chloride Stopped (10/27/19 0153)  . meropenem (MERREM) IV 1 g (11/01/19 1436)    Principal Problem:   Sepsis secondary to UTI [facility]) Active Problems:   Essential hypertension, benign   Complicated UTI (urinary tract infection)   Acute renal failure superimposed on stage 3 chronic kidney disease ([facility])   Pyelonephritis of right kidney   History of prostate cancer   Lactic acidosis   Macrocytic anemia   Acute urinary retention     Vashti Hey, [facility]  If 7PM-7AM, please contact night-coverage [email or web address] Password [email or web address] 11/01/2019, 4:50 PM    LOS: 7 days

## 2019-11-01 NOTE — Evaluation (Signed)
Physical Therapy Evaluation Patient Details Name: [name] MRN: [id number] DOB: 03-29-1940 Today's Date: 11/01/2019   History of Present Illness  79 year old male with past medical history of hypertension, chronic kidney disease stage III, anxiety disorder, chronic pain syndrome, prostate cancer (Stage IIB, Dx 2014 S/P radiation therapy) who presents to [facility] emergency department due to inability to urinate.  Clinical Impression  Patient received in bed, he is agreeable to PT assessment. He reports generalized pain, unable to specify. Requires min assist for supine to sit. Min guard for sit to stand and for ambulation with RW 25 feet. He is steady using RW, but requires increased time with ambulation and asks to turn around after about 12 feet. He will continue to benefit from skilled PT while here to improve strength, functional independence and activity tolerance for return home.      Follow Up Recommendations Home health PT;Supervision for mobility/OOB    Equipment Recommendations  Rolling walker with 5" wheels    Recommendations for Other Services       Precautions / Restrictions Precautions Precautions: Fall Restrictions Weight Bearing Restrictions: No      Mobility  Bed Mobility Overal bed mobility: Needs Assistance Bed Mobility: Supine to Sit     Supine to sit: Min assist     General bed mobility comments: increased time and effort needed for supine to sit. Min guard to safely get seated on edge of bed. Min assist to raise trunk to full seated position. Patient Response: Cooperative  Transfers Overall transfer level: Needs assistance Equipment used: Rolling walker (2 wheeled) Transfers: Sit to/from Stand              Ambulation/Gait Ambulation/Gait assistance: Min guard Gait Distance (Feet): 25 Feet Assistive device: Rolling walker (2 wheeled) Gait Pattern/deviations: Step-through pattern;Decreased stride length;Shuffle Gait velocity:  decreased   General Gait Details: patient fatigued with ambulation, generally steady without LOB.  Stairs            Wheelchair Mobility    Modified Rankin (Stroke Patients Only)       Balance Overall balance assessment: Needs assistance Sitting-balance support: Feet supported Sitting balance-Leahy Scale: Good     Standing balance support: Bilateral upper extremity supported;During functional activity Standing balance-Leahy Scale: Fair Standing balance comment: reliant on RW at this time for safety                             Pertinent Vitals/Pain Pain Assessment: Faces Faces Pain Scale: Hurts a little bit Pain Location: generalized Pain Intervention(s): Monitored during session    Home Living Family/patient expects to be discharged to:: Private residence Living Arrangements: Spouse/significant other Available Help at Discharge: Family;Available PRN/intermittently Type of Home: House Home Access: Level entry     Home Layout: Two level;Able to live on main level with bedroom/bathroom Home Equipment: None      Prior Function Level of Independence: Independent         Comments: reports he was not using AD, driving prior to this.     Hand Dominance        Extremity/Trunk Assessment   Upper Extremity Assessment Upper Extremity Assessment: Generalized weakness    Lower Extremity Assessment Lower Extremity Assessment: Generalized weakness    Cervical / Trunk Assessment Cervical / Trunk Assessment: Normal  Communication   Communication: No difficulties  Cognition Arousal/Alertness: Awake/alert Behavior During Therapy: WFL for tasks assessed/performed Overall Cognitive Status: Within Functional Limits for  tasks assessed                                        General Comments      Exercises     Assessment/Plan    PT Assessment Patient needs continued PT services  PT Problem List Decreased strength;Decreased  mobility;Decreased activity tolerance;Decreased safety awareness;Decreased balance;Decreased knowledge of use of DME;Decreased knowledge of precautions       PT Treatment Interventions DME instruction;Therapeutic activities;Gait training;Therapeutic exercise;Patient/family education;Functional mobility training    PT Goals (Current goals can be found in the Care Plan section)  Acute Rehab PT Goals Patient Stated Goal: to return home PT Goal Formulation: With patient Time For Goal Achievement: 11/14/19 Potential to Achieve Goals: Good    Frequency Min 3X/week   Barriers to discharge        Co-evaluation               AM-PAC PT "6 Clicks" Mobility  Outcome Measure Help needed turning from your back to your side while in a flat bed without using bedrails?: A Little Help needed moving from lying on your back to sitting on the side of a flat bed without using bedrails?: A Little Help needed moving to and from a bed to a chair (including a wheelchair)?: A Little Help needed standing up from a chair using your arms (e.g., wheelchair or bedside chair)?: A Little Help needed to walk in hospital room?: A Little Help needed climbing 3-5 steps with a railing? : A Lot 6 Click Score: 17    End of Session Equipment Utilized During Treatment: Gait belt Activity Tolerance: Patient limited by fatigue Patient left: in chair;with call bell/phone within reach;with chair alarm set Nurse Communication: Mobility status PT Visit Diagnosis: Muscle weakness (generalized) (M62.81);Difficulty in walking, not elsewhere classified (R26.2);Other abnormalities of gait and mobility (R26.89)    Time: 6734-1937 PT Time Calculation (min) (ACUTE ONLY): 25 min   Charges:   PT Evaluation $PT Eval Moderate Complexity: 1 Mod PT Treatments $Gait Training: 8-22 mins        Mattix Imhof, PT, GCS 11/01/19,10:37 AM

## 2019-11-01 NOTE — TOC Progression Note (Signed)
Transition of Care [facility]) - Progression Note    Patient Details  Name: [name] MRN: [id number] Date of Birth: April 19, 1940  Transition of [facility]) CM/SW Contact  Bartholomew Crews, RN Phone Number: [phone] 11/01/2019, 11:22 AM  Clinical Narrative:     Spoke with patient at the bedside to discuss transition plans. Patient stated that he discussed home health with his wife and she was not onboard saying "not at home." Patient agreeable to NCM calling his wife to discuss what she needs at home. Voicemail x 2, pending call back.  Patient agreeable to RW. DME order placed and referral sent to [employer] for delivery to the room.   TOC following for transition needs.   Expected Discharge Plan: [place] Barriers to Discharge: Continued Medical Work up  Expected Discharge Plan and Services Expected Discharge Plan: [place] In-house Referral: NA Discharge Planning Services: CM Consult Post Acute Care Choice: Home Health, Durable Medical Equipment Living arrangements for the past 2 months: Single Family Home                                       Social Determinants of Health (SDOH) Interventions    Readmission Risk Interventions Readmission Risk Prevention Plan 10/31/2019  Transportation Screening Complete  Some recent data might be hidden

## 2019-11-01 NOTE — TOC Progression Note (Signed)
Transition of Care [facility]) - Progression Note    Patient Details  Name: [name] MRN: [id number] Date of Birth: 1940-08-22  Transition of Care [facility]) CM/SW Contact  Bartholomew Crews, RN Phone Number: [phone] 11/01/2019, 3:15 PM  Clinical Narrative:     Spoke with patient and spouse at the bedside. Wife asked about patient going to [facility] for a couple weeks to get his strength stating that her daughter is a Marine scientist there. Discussed therapy recommendations and Medicare guidelines. She verbalized understanding and is agreeable to home health. Discussed agency choice. Referral accepted by [facility] for RN, PT, OT, Aide and SW. Patient will need [place] orders for RN, PT, OT, Aide, SW and Face to Face prior to discharge. Wife stated that she had received call from [employer] concerning copay for walker, and that walker will be delivered to the room. TOC following for transition needs.   Expected Discharge Plan: [place] Barriers to Discharge: Continued Medical Work up  Expected Discharge Plan and Services Expected Discharge Plan: [place] In-house Referral: NA Discharge Planning Services: CM Consult Post Acute Care Choice: Home Health, Durable Medical Equipment Living arrangements for the past 2 months: Single Family Home                 DME Arranged: Walker rolling DME Agency: [facility] Date DME Agency Contacted: 11/01/19 Time DME Agency Contacted: 37   HH Arranged: RN, PT, OT, Nurse's Aide, Social Work [employer] Agency: [facility] [place] Date [place]: 11/01/19 Time [place]: [place] Representative spoke with at [place]: [place] ([place]) Interventions    Readmission Risk Interventions Readmission Risk Prevention Plan 10/31/2019  Transportation Screening Complete  Some recent data might be hidden

## 2019-11-01 NOTE — Evaluation (Signed)
Occupational Therapy Evaluation Patient Details Name: [name] MRN: [id number] DOB: 01/24/1940 Today's Date: 11/01/2019    History of Present Illness 79 year old male with past medical history of hypertension, chronic kidney disease stage III, anxiety disorder, chronic pain syndrome, prostate cancer (Stage IIB, Dx 2014 S/P radiation therapy) who presents to [facility] emergency department due to inability to urinate.   Clinical Impression   PTA patient independent and driving. Admitted for above and limited by decreased activity tolerance, impaired balance, generalized weakness, pain, scrotal edema.  Patient currently requires min guard for transfers and in room mobility using RW, min-mod assist for ADLs.  Discussed possible use of scrotal sling to promote edema reduction, pain relief and positioning, pt agreeable and requested order from MD.  Will follow acutely in order to optimize independence, safety and tolerance for ADLs/mobility prior to dc home.  Recommend HHOT at discharge.     Follow Up Recommendations  Home health OT;Supervision/Assistance - 24 hour    Equipment Recommendations  3 in 1 bedside commode    Recommendations for Other Services       Precautions / Restrictions Precautions Precautions: Fall Precaution Comments: scrotal edema Restrictions Weight Bearing Restrictions: No      Mobility Bed Mobility Overal bed mobility: Needs Assistance Bed Mobility: Sit to Supine     Supine to sit: Min assist Sit to supine: Supervision   General bed mobility comments: increased time and effort, no physical assist required    Transfers Overall transfer level: Needs assistance Equipment used: Rolling walker (2 wheeled) Transfers: Sit to/from Stand Sit to Stand: Min guard         General transfer comment: cueing for hand placement and safety     Balance Overall balance assessment: Needs assistance Sitting-balance support: Feet supported Sitting  balance-Leahy Scale: Good     Standing balance support: Bilateral upper extremity supported;During functional activity Standing balance-Leahy Scale: Fair Standing balance comment: relies on UE support                           ADL either performed or assessed with clinical judgement   ADL Overall ADL's : Needs assistance/impaired     Grooming: Set up;Sitting   Upper Body Bathing: Set up;Sitting   Lower Body Bathing: Minimal assistance;Sit to/from stand   Upper Body Dressing : Set up;Sitting   Lower Body Dressing: Sit to/from stand;Moderate assistance Lower Body Dressing Details (indicate cue type and reason): requires assist with socks, min guard sit to stand  Toilet Transfer: Min guard;Ambulation;RW           Functional mobility during ADLs: Min guard;Rolling walker;Cueing for safety General ADL Comments: pt limited by pain, weakness      Vision         Perception     Praxis      Pertinent Vitals/Pain Pain Assessment: Faces Faces Pain Scale: Hurts even more Pain Location: generalized  Pain Descriptors / Indicators: Discomfort;Grimacing Pain Intervention(s): Monitored during session;Repositioned     Hand Dominance Right   Extremity/Trunk Assessment Upper Extremity Assessment Upper Extremity Assessment: Generalized weakness   Lower Extremity Assessment Lower Extremity Assessment: Defer to PT evaluation   Cervical / Trunk Assessment Cervical / Trunk Assessment: Normal   Communication Communication Communication: No difficulties   Cognition Arousal/Alertness: Awake/alert Behavior During Therapy: WFL for tasks assessed/performed Overall Cognitive Status: Within Functional Limits for tasks assessed  General Comments  scrotal edema- asked MD for scrotal sling order    Exercises     Shoulder Instructions      Home Living Family/patient expects to be discharged to:: Private  residence Living Arrangements: Spouse/significant other Available Help at Discharge: Family;Available PRN/intermittently Type of Home: House Home Access: Level entry     Home Layout: Two level;Able to live on main level with bedroom/bathroom               Home Equipment: None   Additional Comments: need shower setup      Prior Functioning/Environment Level of Independence: Independent        Comments: independent ADLs, IADLs and driving         OT Problem List: Decreased strength;Decreased activity tolerance;Impaired balance (sitting and/or standing);Decreased safety awareness;Decreased knowledge of use of DME or AE;Decreased knowledge of precautions;Pain;Increased edema      OT Treatment/Interventions: Self-care/ADL training;DME and/or AE instruction;Energy conservation;Therapeutic activities;Patient/family education;Balance training    OT Goals(Current goals can be found in the care plan section) Acute Rehab OT Goals Patient Stated Goal: to return home OT Goal Formulation: With patient Time For Goal Achievement: 11/15/19 Potential to Achieve Goals: Good  OT Frequency: Min 2X/week   Barriers to D/C:            Co-evaluation              AM-PAC OT "6 Clicks" Daily Activity     Outcome Measure Help from another person eating meals?: None Help from another person taking care of personal grooming?: A Little Help from another person toileting, which includes using toliet, bedpan, or urinal?: A Little Help from another person bathing (including washing, rinsing, drying)?: A Little Help from another person to put on and taking off regular upper body clothing?: A Little Help from another person to put on and taking off regular lower body clothing?: A Lot 6 Click Score: 18   End of Session Equipment Utilized During Treatment: Rolling walker Nurse Communication: Mobility status  Activity Tolerance: Patient limited by pain Patient left: in bed;with call  bell/phone within reach;with bed alarm set  OT Visit Diagnosis: Other abnormalities of gait and mobility (R26.89);Muscle weakness (generalized) (M62.81);Pain Pain - part of body:  (generalized, scrotum )                Time: 7412-8786 OT Time Calculation (min): 18 min Charges:  OT General Charges $OT Visit: 1 Visit OT Evaluation $OT Eval Moderate Complexity: 1 Mod  Jolaine Artist, OT Acute Rehabilitation Services Pager [phone] Office [phone]   Delight Stare 11/01/2019, 12:38 PM

## 2019-11-01 NOTE — Progress Notes (Signed)
Occupational Therapy Treatment Patient Details Name: [name] MRN: [id number] DOB: 1940-05-16 Today's Date: 11/01/2019    History of present illness 79 year old male with past medical history of hypertension, chronic kidney disease stage III, anxiety disorder, chronic pain syndrome, prostate cancer (Stage IIB, Dx 2014 S/P radiation therapy) who presents to [facility] emergency department due to inability to urinate.   OT comments  Patient supine in bed and agreeable to OT session.  Fit patient with customized scrotal sling after clearance from MD (Dr. Jamse Arn), educated patient and spouse on benefits and wear of sling, hung sign in room and reviewed with RN Laverda Sorenson) as well. Pt reports increased comfort with support of scrotal sling, noted decreased guarded movement with mobility and increased ease.  Completing transfers with min guard and bed mobility with min assist.  Will follow acutely.    Follow Up Recommendations  Home health OT;Supervision/Assistance - 24 hour    Equipment Recommendations  3 in 1 bedside commode    Recommendations for Other Services      Precautions / Restrictions Precautions Precautions: Fall Precaution Comments: scrotal edema Required Braces or Orthoses:  (customized scrotal sling ) Restrictions Weight Bearing Restrictions: No       Mobility Bed Mobility Overal bed mobility: Needs Assistance Bed Mobility: Supine to Sit;Sit to Supine     Supine to sit: Min assist;HOB elevated Sit to supine: Supervision   General bed mobility comments: min assist for trunk support to ascend, supervision for return to supine; increased time and effort   Transfers Overall transfer level: Needs assistance Equipment used: Rolling walker (2 wheeled) Transfers: Sit to/from Stand Sit to Stand: Min guard         General transfer comment: cueing for hand placement and safety, steadying support upon standing     Balance Overall balance assessment:  Needs assistance Sitting-balance support: Feet supported Sitting balance-Leahy Scale: Good     Standing balance support: Bilateral upper extremity supported;During functional activity Standing balance-Leahy Scale: Fair Standing balance comment: relies on UE support                           ADL either performed or assessed with clinical judgement   ADL Overall ADL's : Needs assistance/impaired     Grooming: Set up;Sitting   Upper Body Bathing: Set up;Sitting   Lower Body Bathing: Minimal assistance;Sit to/from stand   Upper Body Dressing : Set up;Sitting   Lower Body Dressing: Sit to/from stand;Moderate assistance Lower Body Dressing Details (indicate cue type and reason): requires assist with socks, min guard sit to stand  Toilet Transfer: Min guard;Ambulation;RW;BSC Toilet Transfer Details (indicate cue type and reason): 3:1 over commode using RW          Functional mobility during ADLs: Min guard;Rolling walker;Cueing for safety General ADL Comments: session focused on scrotal sling application, reviewed benefits with pt and spouse      Vision       Perception     Praxis      Cognition Arousal/Alertness: Awake/alert Behavior During Therapy: WFL for tasks assessed/performed Overall Cognitive Status: Impaired/Different from baseline Area of Impairment: Problem solving;Safety/judgement                         Safety/Judgement: Decreased awareness of safety   Problem Solving: Slow processing;Requires verbal cues General Comments: some slow processing and decreased safety awareness, requires cueing for probelm solving  Exercises     Shoulder Instructions       General Comments customized scrotal sling for patient, donned in supine and completed functional mobility with sling on.  Sling stayed in place and pt reports "I have more support now" and was much less guarded during mobility. Educated on benefits to pt and spouse, hung  sign in room, and reviewed sling with RN Laverda Sorenson).      Pertinent Vitals/ Pain       Pain Assessment: Faces Faces Pain Scale: Hurts little more Pain Location: groin  Pain Descriptors / Indicators: Discomfort;Grimacing Pain Intervention(s): Limited activity within patient's tolerance;Monitored during session;Repositioned;Other (comment) (applied scrotal sling )  Home Living Family/patient expects to be discharged to:: Private residence Living Arrangements: Spouse/significant other Available Help at Discharge: Family;Available PRN/intermittently Type of Home: House Home Access: Level entry     Home Layout: Two level;Able to live on main level with bedroom/bathroom               Home Equipment: None   Additional Comments: need shower setup      Prior Functioning/Environment Level of Independence: Independent        Comments: independent ADLs, IADLs and driving    Frequency  Min 2X/week        Progress Toward Goals  OT Goals(current goals can now be found in the care plan section)  Progress towards OT goals: Progressing toward goals  Acute Rehab OT Goals Patient Stated Goal: to return home OT Goal Formulation: With patient Time For Goal Achievement: 11/15/19 Potential to Achieve Goals: Good ADL Goals Pt Will Perform Grooming: with modified independence;standing Pt Will Perform Lower Body Dressing: with modified independence;sit to/from stand;with adaptive equipment Pt Will Transfer to Toilet: with modified independence;ambulating;bedside commode Pt Will Perform Toileting - Clothing Manipulation and hygiene: with modified independence;sit to/from stand Pt Will Perform Tub/Shower Transfer: Tub transfer;Shower transfer;with modified independence;ambulating;3 in 1;rolling walker Additional ADL Goal #1: Pt will manage scrotal sling with independence.  Plan Discharge plan remains appropriate;Frequency remains appropriate    Co-evaluation                  AM-PAC OT "6 Clicks" Daily Activity     Outcome Measure   Help from another person eating meals?: None Help from another person taking care of personal grooming?: A Little Help from another person toileting, which includes using toliet, bedpan, or urinal?: A Little Help from another person bathing (including washing, rinsing, drying)?: A Little Help from another person to put on and taking off regular upper body clothing?: A Little Help from another person to put on and taking off regular lower body clothing?: A Lot 6 Click Score: 18    End of Session Equipment Utilized During Treatment: Rolling walker  OT Visit Diagnosis: Other abnormalities of gait and mobility (R26.89);Muscle weakness (generalized) (M62.81);Pain Pain - part of body:  (groin)   Activity Tolerance Patient tolerated treatment well   Patient Left in bed;with call bell/phone within reach;with bed alarm set;with family/visitor present   Nurse Communication Mobility status        Time: 3382-5053 OT Time Calculation (min): 33 min  Charges: OT General Charges $OT Visit: 1 Visit OT Evaluation $OT Eval Moderate Complexity: 1 Mod OT Treatments $Self Care/Home Management : 23-37 mins  Jolaine Artist, OT [place] Pager [phone] Office [phone]    Delight Stare 11/01/2019, 3:20 PM

## 2019-11-02 DIAGNOSIS — N390 Urinary tract infection, site not specified: Secondary | ICD-10-CM | POA: Diagnosis not present

## 2019-11-02 DIAGNOSIS — A419 Sepsis, unspecified organism: Secondary | ICD-10-CM | POA: Diagnosis not present

## 2019-11-02 NOTE — Progress Notes (Addendum)
PROGRESS NOTE    [name]  BZJ:[id number]  DOB: 1940/08/07  DOA: 10/25/2019 PCP: Milford Cage, PA Outpatient Specialists:   Hospital course:  79 year old man with HTN, CKD 3, chronic pain syndrome and prostate cancer status post XRT who was admitted with urinary retention.  Urologist successfully placed a catheter however CT of abdomen pelvis revealed right hydronephrosis and hydroureter with stranding concerning for pyelonephritis.  Urine has grown out ESBL and patient is now on meropenem.  Subjective:  Fair no distress no fever no chills Main complaint is there L eye pain and redness He tells me he goes to see Dr. Corky Crafts and is shceduled to have surgery on it soon? otherwise no new issues   Objective: Vitals:   11/01/19 1708 11/01/19 2034 11/02/19 0525 11/02/19 0900  BP: 123/79 111/68 118/68 116/71  Pulse: 90 91 96 94  Resp: 20 18 17 18   Temp: 98.5 F (36.9 C) 98.6 F (37 C) 98 F (36.7 C) 98.2 F (36.8 C)  TempSrc:  Oral Oral Oral  SpO2: 99% 98% 97% 98%  Weight:      Height:        Intake/Output Summary (Last 24 hours) at 11/02/2019 1404 Last data filed at 11/02/2019 1253 Gross per 24 hour  Intake 920 ml  Output 1225 ml  Net -305 ml   Filed Weights   10/28/19 2122 10/29/19 2140 10/30/19 2054  Weight: 84.9 kg 89.4 kg 88 kg     Exam:  Awake alert pleasant in na dno focal deficit L eye redder> R eye no discharge ctab no added sound no rales  RRR, s1 s 2no m, Not on montiors abd soft nt dn  Psych eutymic Skin soft supple no edema ROM intact to major joints   Assessment & Plan:   Pyelonephritis SIRS has resolved with treatment of infection and IV fluid resuscitation. Per ID note on 10/30/2019, previous plan had been to discontinue meropenem and start patient on oral Bactrim for discharge on oral Bactrim.  However it appears that oral Bactrim was never started due to sulfa allergy. Will restart meropenem today, Discussed again with  ID who recommend continuing meropenem until 11/03/2019 when he can be discharged home off antibiotics.  Urinary retention Foley had to be placed by urology as it was a difficult placement in ED. Plan is to keep Foley in place for 7 to 10 days and follow-up with urology for voiding trial. Apparently there is some concern for possible prostatic abscess versus recurrence of prostate cancer with prostatic mass. Urology to see as outpatient in follow-up  Acute on chronic renal failure. Creatinine is improved, seems to have stabilized around 1.6, baseline appears 1.9. 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 He has 2 daughters 1 of whom is an Therapist, sports the other is an LPN. They decline need for RN or home health aide Patient will get home PT, order already placed.   DVT prophylaxis: Lovenox Code Status: Full Family Communication: Spoke at length with patient's daugther and needs OP follow up Disposition Plan:   Patient is from: Home  Anticipated Discharge Location: Home  Barriers to Discharge: Ongoing meropenem  Is patient medically stable for Discharge: liekly d/c 10/31   Consultants:  Urology  Procedures:  None  Antimicrobials:  Meropenem   Data Reviewed:  Basic Metabolic Panel: Recent Labs  Lab 10/27/19 0248 10/28/19 0337 10/29/19 0203 10/30/19 0201 10/31/19 0115  NA 138 138 137 131* 137  K 3.7 4.1 3.9 3.8 4.1  CL 102 102 101 98 103  CO2 25 23 26 23 24   GLUCOSE 157* 123* 128* 127* 118*  BUN 15 14 19 18 17   CREATININE 1.73* 1.64* 1.75* 1.61* 1.59*  CALCIUM 8.7* 8.8* 8.8* 8.4* 8.8*  MG 1.9 1.9 2.1 1.9 2.0  PHOS 2.2* 2.5 2.3* 2.4* 3.4   Liver Function Tests: Recent Labs  Lab 10/27/19 0248  AST 40  ALT 28  ALKPHOS 60  BILITOT 0.6  PROT 6.4*  ALBUMIN 2.2*    No results for input(s): LIPASE, AMYLASE in the last 168 hours. No results for input(s): AMMONIA in the last 168 hours. CBC: Recent Labs  Lab 10/27/19 0248 10/28/19 0337 10/29/19 0203 10/31/19 0115  WBC 10.9* 8.4  --  6.0  HGB 8.8* 9.9* 10.4* 9.1*  HCT 28.0* 31.5* 34.2* 29.3*  MCV 104.1* 105.4*  --  103.9*  PLT 178 203  --  189   Cardiac Enzymes: No results for input(s): CKTOTAL, CKMB, CKMBINDEX, TROPONINI in the last 168 hours. BNP (last 3 results) No results for input(s): PROBNP in the last 8760 hours. CBG: No results for input(s): GLUCAP in the last 168 hours.  Recent Results (from the past 240 hour(s))  Blood culture (routine x 2)     Status: None   Collection Time: 10/25/19  3:10 PM   Specimen: BLOOD  Result Value Ref Range Status   Specimen Description BLOOD RIGHT ANTECUBITAL  Final   Special Requests   Final    BOTTLES DRAWN AEROBIC AND ANAEROBIC Blood Culture results may not be optimal due to an inadequate volume of blood received in culture bottles   Culture   Final    NO GROWTH 5 DAYS Performed at [facility], [place] [address], [place], [place] [address]    Report Status 10/30/2019 FINAL  Final  Blood culture (routine x 2)     Status: None   Collection Time: 10/25/19  3:10 PM   Specimen: BLOOD  Result Value Ref Range Status   Specimen Description BLOOD LEFT ANTECUBITAL  Final   Special Requests   Final    BOTTLES DRAWN AEROBIC AND ANAEROBIC Blood Culture results may not be optimal due to an inadequate volume of blood received in culture bottles   Culture   Final    NO GROWTH 5 DAYS Performed at [facility], [place] [address] [place], [place] [address]    Report Status 10/30/2019 FINAL  Final  Urine Culture     Status: Abnormal   Collection Time: 10/25/19  3:38 PM   Specimen: Urine, Random  Result Value Ref Range Status   Specimen Description URINE, RANDOM  Final   Special Requests   Final    NONE Performed at [facility], [place] [address] [place], [place] [address]    Culture (A)  Final    80,000 COLONIES/mL ESCHERICHIA COLI Confirmed Extended Spectrum Beta-Lactamase Producer (ESBL).  In bloodstream infections from ESBL organisms, carbapenems are preferred over piperacillin/tazobactam. They are shown to have a lower risk of mortality.    Report Status 10/28/2019 FINAL  Final   Organism ID, Bacteria ESCHERICHIA COLI (A)  Final      Susceptibility   Escherichia coli - MIC*    AMPICILLIN >=32  RESISTANT Resistant     CEFAZOLIN >=64 RESISTANT Resistant     CEFTRIAXONE >=64 RESISTANT Resistant     CIPROFLOXACIN >=4 RESISTANT Resistant     GENTAMICIN 4 SENSITIVE Sensitive     IMIPENEM <=0.25 SENSITIVE Sensitive     NITROFURANTOIN 64 INTERMEDIATE Intermediate     TRIMETH/SULFA <=20 SENSITIVE Sensitive     AMPICILLIN/SULBACTAM >=32 RESISTANT Resistant     PIP/TAZO <=4 SENSITIVE Sensitive     * 80,000 COLONIES/mL ESCHERICHIA COLI  Respiratory Panel by RT PCR (Flu A&B, Covid) - Nasopharyngeal Swab     Status: None   Collection Time: 10/25/19  5:30 PM   Specimen: Nasopharyngeal Swab  Result Value Ref Range Status   SARS Coronavirus 2 by RT PCR NEGATIVE NEGATIVE Final    Comment: (NOTE) SARS-CoV-2 target nucleic acids are NOT DETECTED.  The SARS-CoV-2 RNA is generally detectable in upper respiratoy specimens during the acute phase of infection. The lowest concentration of SARS-CoV-2 viral copies this assay can detect is 131 copies/mL. A negative result does not preclude SARS-Cov-2 infection and should not be used as the sole basis for treatment or other patient management decisions. A negative result may occur with  improper specimen collection/handling, submission of specimen other than nasopharyngeal swab, presence of viral mutation(s) within the areas targeted by this assay, and inadequate number of viral copies (<131 copies/mL). A negative result must be combined with clinical observations, patient history, and  epidemiological information. The expected result is Negative.  Fact Sheet for Patients:  PinkCheek.be  Fact Sheet for Healthcare Providers:  GravelBags.it  This test is no t yet approved or cleared by the [place] FDA and  has been authorized for detection and/or diagnosis of SARS-CoV-2 by FDA under an Emergency Use Authorization (EUA). This EUA will remain  in effect (meaning this test can be used) for the duration of the COVID-19 declaration under Section 564(b)(1) of the Act, 21 U.S.C. section 360bbb-3(b)(1), unless the authorization is terminated or revoked sooner.     Influenza A by PCR NEGATIVE NEGATIVE Final   Influenza B by PCR NEGATIVE NEGATIVE Final    Comment: (NOTE) The Xpert Xpress SARS-CoV-2/FLU/RSV assay is intended as an aid in  the diagnosis of influenza from Nasopharyngeal swab specimens and  should not be used as a sole basis for treatment. Nasal washings and  aspirates are unacceptable for Xpert Xpress SARS-CoV-2/FLU/RSV  testing.  Fact Sheet for Patients: PinkCheek.be  Fact Sheet for Healthcare Providers: GravelBags.it  This test is not yet approved or cleared by the [place] FDA and  has been authorized for detection and/or diagnosis of SARS-CoV-2 by  FDA under an Emergency Use Authorization (EUA). This EUA will remain  in effect (meaning this test can be used) for the duration of the  Covid-19 declaration under Section 564(b)(1) of the Act, 21  U.S.C. section 360bbb-3(b)(1), unless the authorization is  terminated or revoked. Performed at [facility], [place] [address], [place], [place] [address]       Studies: No results found.   Scheduled Meds: . amLODipine  10 mg Oral Daily  . atenolol  50 mg Oral Daily  . Chlorhexidine Gluconate Cloth  6 each Topical Daily  . enoxaparin (LOVENOX) injection  40 mg Subcutaneous  Daily  . senna  1 tablet Oral QHS  . timolol  1 drop Both Eyes BID   Continuous Infusions: . sodium chloride Stopped (10/27/19 0153)  . meropenem (MERREM) IV 1 g (11/02/19 0921)  Principal Problem:   Sepsis secondary to UTI [facility]) Active Problems:   Essential hypertension, benign   Complicated UTI (urinary tract infection)   Acute renal failure superimposed on stage 3 chronic kidney disease (HCC)   Pyelonephritis of right kidney   History of prostate cancer   Lactic acidosis   Macrocytic anemia   Acute urinary retention     Jai-Gurmukh Shirleyann Montero, [facility]  If 7PM-7AM, please contact night-coverage [email or web address] Password [email or web address] 11/02/2019, 2:04 PM    LOS: 8 days

## 2019-11-03 DIAGNOSIS — N390 Urinary tract infection, site not specified: Secondary | ICD-10-CM | POA: Diagnosis not present

## 2019-11-03 DIAGNOSIS — A419 Sepsis, unspecified organism: Secondary | ICD-10-CM | POA: Diagnosis not present

## 2019-11-03 MED ORDER — POLYETHYLENE GLYCOL 3350 17 G PO PACK: 17.0000 g | PACK | Freq: Every day | ORAL | 0 refills | Status: DC | PRN

## 2019-11-03 MED ORDER — ACETAMINOPHEN 325 MG PO TABS: 650.0000 mg | ORAL_TABLET | Freq: Four times a day (QID) | ORAL | Status: AC | PRN

## 2019-11-03 NOTE — Discharge Summary (Signed)
Physician Discharge Summary  [name] QPY:[id number] DOB: May 27, 1940 DOA: 10/25/2019  PCP: Milford Cage, PA  Admit date: 10/25/2019 Discharge date: 11/03/2019  Time spent: 35 minutes  Recommendations for Outpatient Follow-up:  1. Will need outpatient follow-up with [facility] set up-number given so that family can call and make appointment 2. We will get home health RN PT OT aide social worker and walker 3. Needs Chem-12, CBC in about 1 week post discharge   Discharge Diagnoses:  Principal Problem:   Sepsis secondary to UTI [facility]) Active Problems:   Essential hypertension, benign   Complicated UTI (urinary tract infection)   Acute renal failure superimposed on stage 3 chronic kidney disease (HCC)   Pyelonephritis of right kidney   History of prostate cancer   Lactic acidosis   Macrocytic anemia   Acute urinary retention   Discharge Condition: Improved  Diet recommendation: Heart healthy  Filed Weights   10/29/19 2140 10/30/19 2054 11/02/19 1952  Weight: 89.4 kg 88 kg 82.6 kg    History of present illness:  79 year old man with HTN, CKD 3, chronic pain syndrome and prostate cancer status post XRT who was admitted with urinary retention.  Urologist successfully placed a catheter however CT of abdomen pelvis revealed right hydronephrosis and hydroureter with stranding concerning for pyelonephritis.  Urine has grown out ESBL and patient is now on meropenem.   Hospital Course:  Pyelonephritis SIRS has resolved with treatment of infection and IV fluid resuscitation. Per ID note on 10/30/2019, previous plan had been to discontinue meropenem and start patient on oral Bactrim for discharge on oral Bactrim.  However it appears that oral Bactrim was never started due to sulfa allergy. Will restart meropenem today, Discussed again with ID who recommend continuing meropenem until 11/03/2019 when he can be discharged home off antibiotics.  Urinary retention Foley  had to be placed by urology as it was a difficult placement in ED. Plan is to keep Foley in place for 7 to 10 days and follow-up with urology for voiding trial. Apparently there is some concern for possible prostatic abscess versus recurrence of prostate cancer with prostatic mass. Urology to see as outpatient in follow-up I have CCed Dr. Matilde Sprang of urology to ensure that this is arranged  Acute on chronic renal failure. Creatinine is improved, seems to have stabilized around 1.6, baseline appears 1.9. 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 He has 2 daughters 1 of whom is an Therapist, sports the other is an LPN. They decline need for RN or home health aide Patient will get home PT, order already placed.   Discharge Exam: Vitals:   11/02/19 1952 11/03/19 0359  BP: (!) 144/80 129/73  Pulse: 99 84  Resp: 18 14  Temp: 97.6 F (36.4 C) 98.6 F (37 C)  SpO2: 98% 97%    General: Awake alert coherent no distress his left eye feels and looks a little better he is having some issues with constipation this morning Cardiovascular: S1-S2 no murmur no rub no gallop Respiratory: Clear no added sound Abdomen soft no rebound no lower extremity edema Neurologically intact  Discharge Instructions   Discharge Instructions    Diet - low sodium heart healthy   Complete by: As directed    Discharge instructions  Complete by: As directed    Make sure you follow with your primary care physician in about 1 week You will need to discuss your retention of urine with the urologist in the outpatient setting-I have placed a number for [facility] on the chart you will need to call them and get follow-up with the same and an appointment as a hospital follow-up for urinary  retention Suggest getting labs in about 1 week If you have high fevers chills or other issues report to your regular doctor and let them know We will teach you how to use a leg bag prior to discharge   Increase activity slowly   Complete by: As directed      Allergies as of 11/03/2019      Reactions   Tape Other (See Comments)   Redness and blisters/paper tape-not latex   Doxycycline Hyclate Other (See Comments)   Mental changes    Sulfa Antibiotics Hives      Medication List    STOP taking these medications   nitrofurantoin (macrocrystal-monohydrate) 100 MG capsule Commonly known as: Macrobid     TAKE these medications   acetaminophen 325 MG tablet Commonly known as: TYLENOL Take 2 tablets (650 mg total) by mouth every 6 (six) hours as needed for mild pain (or Fever >/= 101).   Alphagan P 0.1 % Soln Generic drug: brimonidine Place 1 drop into the left eye every 8 (eight) hours as needed (eye pressure).   ALPRAZolam 0.5 MG tablet Commonly known as: XANAX Take 0.5 mg by mouth 2 (two) times daily as needed for anxiety. What changed: Another medication with the same name was removed. Continue taking this medication, and follow the directions you see here.   amLODipine 10 MG tablet Commonly known as: NORVASC Take 1 tablet (10 mg total) by mouth daily.   aspirin 81 MG EC tablet Take 1 tablet (81 mg total) by mouth daily.   atenolol 50 MG tablet Commonly known as: TENORMIN Take 1 tablet (50 mg total) by mouth daily. Pt needs appointment for further refills   HYDROcodone-acetaminophen 10-325 MG tablet Commonly known as: NORCO Take 1 tablet by mouth 4 (four) times daily as needed for moderate pain or severe pain.   polyethylene glycol 17 g packet Commonly known as: MIRALAX / GLYCOLAX Take 17 g by mouth daily as needed for mild constipation (For mild constipation not relieved by Senokot).   Rocklatan 0.02-0.005 % Soln Generic drug: Netarsudil-Latanoprost Apply 1 drop  to eye at bedtime.   timolol 0.5 % ophthalmic solution Commonly known as: TIMOPTIC Place 1 drop into both eyes 2 (two) times daily as needed (pressure).            Durable Medical Equipment  (From admission, onward)         Start     Ordered   11/01/19 1111  For home use only DME Walker rolling  Once       Question Answer Comment  Walker: With 5 Inch Wheels   Patient needs a walker to treat with the following condition Decreased functional mobility and endurance      11/01/19 1111         Allergies  Allergen Reactions  . Tape Other (See Comments)    Redness and blisters/paper tape-not latex  . Doxycycline Hyclate Other (See Comments)    Mental changes   . Sulfa Antibiotics Hives    Follow-up Information    [facility].   Specialty:  Home Health Services Why: the office to call to schedule home health visits for nursing, physical therapy, occupational therapy, aide, and social work Contact information: [address] [place] [address] [phone]        [facility]. Schedule an appointment as soon as possible for a visit in 1 week(s).   Contact information: [place] [phone]               The results of significant diagnostics from this hospitalization (including imaging, microbiology, ancillary and laboratory) are listed below for reference.    Significant Diagnostic Studies: CT ABDOMEN PELVIS WO CONTRAST  Result Date: 10/25/2019 CLINICAL DATA:  Sepsis EXAM: CT ABDOMEN AND PELVIS WITHOUT CONTRAST TECHNIQUE: Multidetector CT imaging of the abdomen and pelvis was performed following the standard protocol without IV contrast. COMPARISON:  10/19/2019 and 12/25/2017 FINDINGS: Lower chest: The lung bases are clear. Hepatobiliary: Multiple low-attenuation lesions throughout the liver, unchanged since prior studies. Likely cysts. Gallbladder and bile ducts are  normal. Pancreas: Unremarkable. No pancreatic ductal dilatation or surrounding inflammatory changes. Spleen: Normal in size without focal abnormality. Adrenals/Urinary Tract: No adrenal gland nodules. Right renal atrophy. Large stones in the right renal pelvis measuring 10 and 14 mm in diameter. No change since prior study. Mild right hydronephrosis and hydroureter. No ureteral stones are demonstrated. Ureteral dilatation is new since the previous study. This may indicate reflux or pyelonephritis. A non radiopaque obstructing stone could also be present. The bladder is decompressed with a Foley catheter but there appears to be diffuse bladder wall thickening possibly due to cystitis or outlet obstruction. Mild stranding in the pelvis, likely inflammatory. Stomach/Bowel: Stomach is within normal limits. Appendix appears normal. No evidence of bowel wall thickening, distention, or inflammatory changes. Vascular/Lymphatic: Aortic atherosclerosis. No enlarged abdominal or pelvic lymph nodes. Reproductive: Prostate gland is diffusely enlarged with seed implants present. Low-attenuation region in the left prostate is unchanged since prior study. Possibly mass or abscess. No significant pelvic lymphadenopathy. Other: No free air or free fluid in the abdomen. Moderate-sized periumbilical hernia containing fat. Small right inguinal hernia containing fat. Musculoskeletal: Degenerative changes in the spine and hips. IMPRESSION: 1. Large stones in the right renal pelvis with mild right hydronephrosis and hydroureter. No ureteral stones are demonstrated. This may indicate reflux or pyelonephritis. A non radiopaque obstructing stone could also be present. 2. The bladder is decompressed with a Foley catheter but there appears to be diffuse bladder wall thickening possibly due to cystitis or outlet obstruction. Mild stranding in the pelvis, likely inflammatory. 3. Prostate gland is diffusely enlarged with seed implants present.  Low-attenuation region in the left prostate gland is unchanged since prior study. Possibly mass or abscess. 4. Moderate-sized periumbilical hernia containing fat. Small right inguinal hernia containing fat. Aortic Atherosclerosis (ICD10-I70.0). Electronically Signed   By: Lucienne Capers M.D.   On: 10/25/2019 19:55   DG Chest Portable 1 View  Result Date: 10/25/2019 CLINICAL DATA:  Fever urinary retention EXAM: PORTABLE CHEST 1 VIEW COMPARISON:  12/27/2017 FINDINGS: The heart size and mediastinal contours are within normal limits. Both lungs are clear. The visualized skeletal structures are unremarkable. IMPRESSION: No active disease. Electronically Signed   By: Donavan Foil M.D.   On: 10/25/2019 17:38   CT Renal Stone Study  Result Date: 10/19/2019 CLINICAL DATA:  Flank pain, urinary retention. EXAM: CT ABDOMEN AND PELVIS WITHOUT CONTRAST TECHNIQUE: Multidetector CT imaging of the abdomen and pelvis was  performed following the standard protocol without IV contrast. COMPARISON:  12/25/2017 FINDINGS: Lower chest: No acute abnormality Hepatobiliary: Numerous low-density areas within the liver, likely cysts. These are unchanged since prior study. Gallbladder unremarkable. Pancreas: No focal abnormality or ductal dilatation. Spleen: No focal abnormality.  Normal size. Adrenals/Urinary Tract: Large stone within the right renal pelvis measuring 15 mm. Right lower pole renal stone measuring 9 mm. No hydronephrosis. No ureteral stones. Adrenal glands unremarkable. Urinary bladder appears thick walled. Stomach/Bowel: Sigmoid diverticulosis. No active diverticulitis. Normal appendix. Stomach and small bowel decompressed, unremarkable. Vascular/Lymphatic: Aortic atherosclerosis. No evidence of aneurysm or adenopathy. Reproductive: Prostate enlargement. Low-density area within the left side of the prostate measures 3 cm. Brachytherapy seeds in the prostate. Other: No free fluid or free air. Supraumbilical ventral  hernia containing fat. Musculoskeletal: No acute bony abnormality. IMPRESSION: Right renal pelvic and lower pole stones. No ureteral stones or hydronephrosis. Multiple hepatic cysts. Prominent prostate. Low-density area in the left side of the prostate with surrounding radiation seeds. This could reflect prosthetic mass or abscess. Mild bladder wall thickening, likely related to prostate enlargement and bladder outlet obstruction. Aortic atherosclerosis. Electronically Signed   By: Rolm Baptise M.D.   On: 10/19/2019 17:41    Microbiology: Recent Results (from the past 240 hour(s))  Blood culture (routine x 2)     Status: None   Collection Time: 10/25/19  3:10 PM   Specimen: BLOOD  Result Value Ref Range Status   Specimen Description BLOOD RIGHT ANTECUBITAL  Final   Special Requests   Final    BOTTLES DRAWN AEROBIC AND ANAEROBIC Blood Culture results may not be optimal due to an inadequate volume of blood received in culture bottles   Culture   Final    NO GROWTH 5 DAYS Performed at [facility], [place] [address] [place], [place] [address]    Report Status 10/30/2019 FINAL  Final  Blood culture (routine x 2)     Status: None   Collection Time: 10/25/19  3:10 PM   Specimen: BLOOD  Result Value Ref Range Status   Specimen Description BLOOD LEFT ANTECUBITAL  Final   Special Requests   Final    BOTTLES DRAWN AEROBIC AND ANAEROBIC Blood Culture results may not be optimal due to an inadequate volume of blood received in culture bottles   Culture   Final    NO GROWTH 5 DAYS Performed at [facility], [place] [address], [place], [place] [address]    Report Status 10/30/2019 FINAL  Final  Urine Culture     Status: Abnormal   Collection Time: 10/25/19  3:38 PM   Specimen: Urine, Random  Result Value Ref Range Status   Specimen Description URINE, RANDOM  Final   Special Requests   Final    NONE Performed at [facility], [place] [address] [place], [place] [address]    Culture  (A)  Final    80,000 COLONIES/mL ESCHERICHIA COLI Confirmed Extended Spectrum Beta-Lactamase Producer (ESBL).  In bloodstream infections from ESBL organisms, carbapenems are preferred over piperacillin/tazobactam. They are shown to have a lower risk of mortality.    Report Status 10/28/2019 FINAL  Final   Organism ID, Bacteria ESCHERICHIA COLI (A)  Final      Susceptibility   Escherichia coli - MIC*    AMPICILLIN >=32 RESISTANT Resistant     CEFAZOLIN >=64 RESISTANT Resistant     CEFTRIAXONE >=64 RESISTANT Resistant     CIPROFLOXACIN >=4 RESISTANT Resistant  GENTAMICIN 4 SENSITIVE Sensitive     IMIPENEM <=0.25 SENSITIVE Sensitive     NITROFURANTOIN 64 INTERMEDIATE Intermediate     TRIMETH/SULFA <=20 SENSITIVE Sensitive     AMPICILLIN/SULBACTAM >=32 RESISTANT Resistant     PIP/TAZO <=4 SENSITIVE Sensitive     * 80,000 COLONIES/mL ESCHERICHIA COLI  Respiratory Panel by RT PCR (Flu A&B, Covid) - Nasopharyngeal Swab     Status: None   Collection Time: 10/25/19  5:30 PM   Specimen: Nasopharyngeal Swab  Result Value Ref Range Status   SARS Coronavirus 2 by RT PCR NEGATIVE NEGATIVE Final    Comment: (NOTE) SARS-CoV-2 target nucleic acids are NOT DETECTED.  The SARS-CoV-2 RNA is generally detectable in upper respiratoy specimens during the acute phase of infection. The lowest concentration of SARS-CoV-2 viral copies this assay can detect is 131 copies/mL. A negative result does not preclude SARS-Cov-2 infection and should not be used as the sole basis for treatment or other patient management decisions. A negative result may occur with  improper specimen collection/handling, submission of specimen other than nasopharyngeal swab, presence of viral mutation(s) within the areas targeted by this assay, and inadequate number of viral copies (<131 copies/mL). A negative result must be combined with clinical observations, patient history, and epidemiological information. The expected result  is Negative.  Fact Sheet for Patients:  PinkCheek.be  Fact Sheet for Healthcare Providers:  GravelBags.it  This test is no t yet approved or cleared by the [place] FDA and  has been authorized for detection and/or diagnosis of SARS-CoV-2 by FDA under an Emergency Use Authorization (EUA). This EUA will remain  in effect (meaning this test can be used) for the duration of the COVID-19 declaration under Section 564(b)(1) of the Act, 21 U.S.C. section 360bbb-3(b)(1), unless the authorization is terminated or revoked sooner.     Influenza A by PCR NEGATIVE NEGATIVE Final   Influenza B by PCR NEGATIVE NEGATIVE Final    Comment: (NOTE) The Xpert Xpress SARS-CoV-2/FLU/RSV assay is intended as an aid in  the diagnosis of influenza from Nasopharyngeal swab specimens and  should not be used as a sole basis for treatment. Nasal washings and  aspirates are unacceptable for Xpert Xpress SARS-CoV-2/FLU/RSV  testing.  Fact Sheet for Patients: PinkCheek.be  Fact Sheet for Healthcare Providers: GravelBags.it  This test is not yet approved or cleared by the [place] FDA and  has been authorized for detection and/or diagnosis of SARS-CoV-2 by  FDA under an Emergency Use Authorization (EUA). This EUA will remain  in effect (meaning this test can be used) for the duration of the  Covid-19 declaration under Section 564(b)(1) of the Act, 21  U.S.C. section 360bbb-3(b)(1), unless the authorization is  terminated or revoked. Performed at [facility], [place] [address], [place], [place] [address]      Labs: Basic Metabolic Panel: Recent Labs  Lab 10/28/19 [phone]) [phone] 10/29/19 0203 10/30/19 0201 10/31/19 0115  NA 138 137 131* 137  K 4.1 3.9 3.8 4.1  CL 102 101 98 103  CO2 23 26 23 24   GLUCOSE 123* 128* 127* 118*  BUN 14 19 18 17   CREATININE 1.64* 1.75* 1.61* 1.59*   CALCIUM 8.8* 8.8* 8.4* 8.8*  MG 1.9 2.1 1.9 2.0  PHOS 2.5 2.3* 2.4* 3.4   Liver Function Tests: No results for input(s): AST, ALT, ALKPHOS, BILITOT, PROT, ALBUMIN in the last 168 hours. No results for input(s): LIPASE, AMYLASE in the last 168 hours. No results for input(s): AMMONIA in  the last 168 hours. CBC: Recent Labs  Lab 10/28/19 0337 10/29/19 0203 10/31/19 0115  WBC 8.4  --  6.0  HGB 9.9* 10.4* 9.1*  HCT 31.5* 34.2* 29.3*  MCV 105.4*  --  103.9*  PLT 203  --  189   Cardiac Enzymes: No results for input(s): CKTOTAL, CKMB, CKMBINDEX, TROPONINI in the last 168 hours. BNP: BNP (last 3 results) No results for input(s): BNP in the last 8760 hours.  ProBNP (last 3 results) No results for input(s): PROBNP in the last 8760 hours.  CBG: No results for input(s): GLUCAP in the last 168 hours.     Signed:  Nita Sells MD   [facility] 11/03/2019, 8:17 AM

## 2020-07-20 ENCOUNTER — Encounter (HOSPITAL_COMMUNITY)
Admission: EM | Disposition: A | Discharge status: Home Health | Payer: Self-pay | Source: Home / Self Care | Attending: Internal Medicine

## 2020-07-20 ENCOUNTER — Emergency Department (HOSPITAL_COMMUNITY): Payer: Medicare PPO

## 2020-07-20 ENCOUNTER — Inpatient Hospital Stay (HOSPITAL_COMMUNITY): Payer: Medicare PPO | Admitting: Anesthesiology

## 2020-07-20 ENCOUNTER — Inpatient Hospital Stay (HOSPITAL_COMMUNITY): Payer: Medicare PPO

## 2020-07-20 ENCOUNTER — Inpatient Hospital Stay (HOSPITAL_COMMUNITY)
Admission: EM | Admit: 2020-07-20 | Discharge: 2020-07-23 | DRG: 853 | Disposition: A | Discharge status: Home Health | Payer: Medicare PPO | Attending: Internal Medicine | Admitting: Internal Medicine

## 2020-07-20 ENCOUNTER — Encounter (HOSPITAL_COMMUNITY): Payer: Self-pay | Admitting: Emergency Medicine

## 2020-07-20 DIAGNOSIS — Z881 Allergy status to other antibiotic agents status: Secondary | ICD-10-CM | POA: Diagnosis not present

## 2020-07-20 DIAGNOSIS — Z888 Allergy status to other drugs, medicaments and biological substances status: Secondary | ICD-10-CM

## 2020-07-20 DIAGNOSIS — I129 Hypertensive chronic kidney disease with stage 1 through stage 4 chronic kidney disease, or unspecified chronic kidney disease: Secondary | ICD-10-CM | POA: Diagnosis present

## 2020-07-20 DIAGNOSIS — A419 Sepsis, unspecified organism: Secondary | ICD-10-CM | POA: Diagnosis not present

## 2020-07-20 DIAGNOSIS — E872 Acidosis, unspecified: Secondary | ICD-10-CM | POA: Diagnosis present

## 2020-07-20 DIAGNOSIS — Z8673 Personal history of transient ischemic attack (TIA), and cerebral infarction without residual deficits: Secondary | ICD-10-CM

## 2020-07-20 DIAGNOSIS — E869 Volume depletion, unspecified: Secondary | ICD-10-CM | POA: Diagnosis present

## 2020-07-20 DIAGNOSIS — N136 Pyonephrosis: Secondary | ICD-10-CM | POA: Diagnosis present

## 2020-07-20 DIAGNOSIS — A4151 Sepsis due to Escherichia coli [E. coli]: Principal | ICD-10-CM | POA: Diagnosis present

## 2020-07-20 DIAGNOSIS — Z923 Personal history of irradiation: Secondary | ICD-10-CM | POA: Diagnosis not present

## 2020-07-20 DIAGNOSIS — Z7982 Long term (current) use of aspirin: Secondary | ICD-10-CM | POA: Diagnosis not present

## 2020-07-20 DIAGNOSIS — R6520 Severe sepsis without septic shock: Secondary | ICD-10-CM | POA: Diagnosis present

## 2020-07-20 DIAGNOSIS — F419 Anxiety disorder, unspecified: Secondary | ICD-10-CM | POA: Diagnosis present

## 2020-07-20 DIAGNOSIS — Z8546 Personal history of malignant neoplasm of prostate: Secondary | ICD-10-CM | POA: Diagnosis not present

## 2020-07-20 DIAGNOSIS — Z8249 Family history of ischemic heart disease and other diseases of the circulatory system: Secondary | ICD-10-CM | POA: Diagnosis not present

## 2020-07-20 DIAGNOSIS — N1330 Unspecified hydronephrosis: Secondary | ICD-10-CM | POA: Diagnosis not present

## 2020-07-20 DIAGNOSIS — Z882 Allergy status to sulfonamides status: Secondary | ICD-10-CM

## 2020-07-20 DIAGNOSIS — Z79899 Other long term (current) drug therapy: Secondary | ICD-10-CM | POA: Diagnosis not present

## 2020-07-20 DIAGNOSIS — N200 Calculus of kidney: Secondary | ICD-10-CM

## 2020-07-20 DIAGNOSIS — G9341 Metabolic encephalopathy: Secondary | ICD-10-CM | POA: Diagnosis present

## 2020-07-20 DIAGNOSIS — I10 Essential (primary) hypertension: Secondary | ICD-10-CM | POA: Diagnosis not present

## 2020-07-20 DIAGNOSIS — Z87891 Personal history of nicotine dependence: Secondary | ICD-10-CM | POA: Diagnosis not present

## 2020-07-20 DIAGNOSIS — N179 Acute kidney failure, unspecified: Secondary | ICD-10-CM | POA: Diagnosis present

## 2020-07-20 DIAGNOSIS — Z823 Family history of stroke: Secondary | ICD-10-CM | POA: Diagnosis not present

## 2020-07-20 DIAGNOSIS — N1832 Chronic kidney disease, stage 3b: Secondary | ICD-10-CM | POA: Diagnosis present

## 2020-07-20 DIAGNOSIS — E785 Hyperlipidemia, unspecified: Secondary | ICD-10-CM | POA: Diagnosis present

## 2020-07-20 DIAGNOSIS — R4182 Altered mental status, unspecified: Secondary | ICD-10-CM | POA: Diagnosis present

## 2020-07-20 DIAGNOSIS — M1990 Unspecified osteoarthritis, unspecified site: Secondary | ICD-10-CM | POA: Diagnosis present

## 2020-07-20 DIAGNOSIS — H409 Unspecified glaucoma: Secondary | ICD-10-CM | POA: Diagnosis present

## 2020-07-20 DIAGNOSIS — N201 Calculus of ureter: Secondary | ICD-10-CM | POA: Diagnosis present

## 2020-07-20 DIAGNOSIS — Z20822 Contact with and (suspected) exposure to covid-19: Secondary | ICD-10-CM | POA: Diagnosis present

## 2020-07-20 DIAGNOSIS — N390 Urinary tract infection, site not specified: Secondary | ICD-10-CM | POA: Diagnosis not present

## 2020-07-20 HISTORY — PX: CYSTOSCOPY W/ URETERAL STENT PLACEMENT: SHX1429

## 2020-07-20 LAB — URINALYSIS, ROUTINE W REFLEX MICROSCOPIC
Bilirubin Urine: NEGATIVE
Glucose, UA: NEGATIVE mg/dL
Ketones, ur: 5 mg/dL — AB
Nitrite: NEGATIVE
Protein, ur: 100 mg/dL — AB
Specific Gravity, Urine: 1.023 (ref 1.005–1.030)
pH: 5 (ref 5.0–8.0)

## 2020-07-20 LAB — CBC WITH DIFFERENTIAL/PLATELET
Abs Immature Granulocytes: 0.1 10*3/uL — ABNORMAL HIGH (ref 0.00–0.07)
Basophils Absolute: 0 10*3/uL (ref 0.0–0.1)
Basophils Relative: 0 %
Eosinophils Absolute: 0 10*3/uL (ref 0.0–0.5)
Eosinophils Relative: 0 %
HCT: 32.2 % — ABNORMAL LOW (ref 39.0–52.0)
Hemoglobin: 10.1 g/dL — ABNORMAL LOW (ref 13.0–17.0)
Immature Granulocytes: 1 %
Lymphocytes Relative: 2 %
Lymphs Abs: 0.1 10*3/uL — ABNORMAL LOW (ref 0.7–4.0)
MCH: 32.8 pg (ref 26.0–34.0)
MCHC: 31.4 g/dL (ref 30.0–36.0)
MCV: 104.5 fL — ABNORMAL HIGH (ref 80.0–100.0)
Monocytes Absolute: 0.1 10*3/uL (ref 0.1–1.0)
Monocytes Relative: 2 %
Neutro Abs: 7.6 10*3/uL (ref 1.7–7.7)
Neutrophils Relative %: 95 %
Platelets: 95 10*3/uL — ABNORMAL LOW (ref 150–400)
RBC: 3.08 MIL/uL — ABNORMAL LOW (ref 4.22–5.81)
RDW: 13.1 % (ref 11.5–15.5)
WBC: 7.9 10*3/uL (ref 4.0–10.5)
nRBC: 0 % (ref 0.0–0.2)

## 2020-07-20 LAB — COMPREHENSIVE METABOLIC PANEL
ALT: 15 U/L (ref 0–44)
AST: 21 U/L (ref 15–41)
Albumin: 3.5 g/dL (ref 3.5–5.0)
Alkaline Phosphatase: 55 U/L (ref 38–126)
Anion gap: 8 (ref 5–15)
BUN: 27 mg/dL — ABNORMAL HIGH (ref 8–23)
CO2: 24 mmol/L (ref 22–32)
Calcium: 9 mg/dL (ref 8.9–10.3)
Chloride: 103 mmol/L (ref 98–111)
Creatinine, Ser: 2.61 mg/dL — ABNORMAL HIGH (ref 0.61–1.24)
GFR, Estimated: 24 mL/min — ABNORMAL LOW (ref >60)
Glucose, Bld: 148 mg/dL — ABNORMAL HIGH (ref 70–99)
Potassium: 4.1 mmol/L (ref 3.5–5.1)
Sodium: 135 mmol/L (ref 135–145)
Total Bilirubin: 0.7 mg/dL (ref 0.3–1.2)
Total Protein: 7.6 g/dL (ref 6.5–8.1)

## 2020-07-20 LAB — LACTIC ACID, PLASMA
Lactic Acid, Venous: 2 mmol/L (ref 0.5–1.9)
Lactic Acid, Venous: 1.3 mmol/L (ref 0.5–1.9)

## 2020-07-20 LAB — RESP PANEL BY RT-PCR (FLU A&B, COVID) ARPGX2
Influenza A by PCR: NEGATIVE
Influenza B by PCR: NEGATIVE
SARS Coronavirus 2 by RT PCR: NEGATIVE

## 2020-07-20 LAB — APTT: aPTT: 37 seconds — ABNORMAL HIGH (ref 24–36)

## 2020-07-20 LAB — PROTIME-INR
INR: 1.5 — ABNORMAL HIGH (ref 0.8–1.2)
Prothrombin Time: 18.3 seconds — ABNORMAL HIGH (ref 11.4–15.2)

## 2020-07-20 SURGERY — CYSTOSCOPY, WITH RETROGRADE PYELOGRAM AND URETERAL STENT INSERTION
Anesthesia: General | Laterality: Right

## 2020-07-20 SURGERY — CYSTOSCOPY, WITH RETROGRADE PYELOGRAM AND URETERAL STENT INSERTION
Anesthesia: General | Site: Urethra | Laterality: Right

## 2020-07-20 MED ORDER — SODIUM CHLORIDE 0.9 % IV SOLN
1.0000 g | INTRAVENOUS | Status: AC
  Administered 2020-07-20: 1 g via INTRAVENOUS
  Filled 2020-07-20: qty 1

## 2020-07-20 MED ORDER — PHENYLEPHRINE 40 MCG/ML (10ML) SYRINGE FOR IV PUSH (FOR BLOOD PRESSURE SUPPORT)
PREFILLED_SYRINGE | INTRAVENOUS | Status: DC | PRN
  Administered 2020-07-20 (×3): 80 ug via INTRAVENOUS

## 2020-07-20 MED ORDER — PROPOFOL 500 MG/50ML IV EMUL
INTRAVENOUS | Status: DC | PRN
  Administered 2020-07-20: 150 mg via INTRAVENOUS

## 2020-07-20 MED ORDER — LACTATED RINGERS IV BOLUS (SEPSIS)
1000.0000 mL | Freq: Once | INTRAVENOUS | Status: AC
  Administered 2020-07-20: 1000 mL via INTRAVENOUS

## 2020-07-20 MED ORDER — SODIUM CHLORIDE 0.9 % IV SOLN
500.0000 mg | Freq: Two times a day (BID) | INTRAVENOUS | Status: DC
  Administered 2020-07-21: 500 mg via INTRAVENOUS
  Filled 2020-07-20 (×2): qty 0.5

## 2020-07-20 MED ORDER — ONDANSETRON HCL 4 MG/2ML IJ SOLN
INTRAMUSCULAR | Status: DC | PRN
  Administered 2020-07-20: 4 mg via INTRAVENOUS

## 2020-07-20 MED ORDER — FENTANYL CITRATE (PF) 100 MCG/2ML IJ SOLN: 25.0000 ug | INTRAMUSCULAR | Status: DC | PRN

## 2020-07-20 MED ORDER — ONDANSETRON HCL 4 MG/2ML IJ SOLN
INTRAMUSCULAR | Status: AC
  Filled 2020-07-20: qty 2

## 2020-07-20 MED ORDER — FENTANYL CITRATE (PF) 100 MCG/2ML IJ SOLN
INTRAMUSCULAR | Status: AC
  Filled 2020-07-20: qty 2

## 2020-07-20 MED ORDER — LACTATED RINGERS IV SOLN: INTRAVENOUS | Status: DC

## 2020-07-20 MED ORDER — OXYCODONE HCL 5 MG PO TABS
5.0000 mg | ORAL_TABLET | Freq: Once | ORAL | Status: DC | PRN
Start: 2020-07-20 — End: 2020-07-20

## 2020-07-20 MED ORDER — LIDOCAINE 2% (20 MG/ML) 5 ML SYRINGE
INTRAMUSCULAR | Status: AC
  Filled 2020-07-20: qty 5

## 2020-07-20 MED ORDER — LACTATED RINGERS IV SOLN: INTRAVENOUS | Status: AC

## 2020-07-20 MED ORDER — PROPOFOL 10 MG/ML IV BOLUS
INTRAVENOUS | Status: AC
  Filled 2020-07-20: qty 20

## 2020-07-20 MED ORDER — DEXAMETHASONE SODIUM PHOSPHATE 10 MG/ML IJ SOLN
INTRAMUSCULAR | Status: AC
  Filled 2020-07-20: qty 1

## 2020-07-20 MED ORDER — LIDOCAINE 2% (20 MG/ML) 5 ML SYRINGE
INTRAMUSCULAR | Status: DC | PRN
  Administered 2020-07-20: 100 mg via INTRAVENOUS

## 2020-07-20 MED ORDER — FENTANYL CITRATE (PF) 100 MCG/2ML IJ SOLN
INTRAMUSCULAR | Status: DC | PRN
  Administered 2020-07-20 (×2): 50 ug via INTRAVENOUS

## 2020-07-20 MED ORDER — WATER FOR IRRIGATION, STERILE IR SOLN
Status: DC | PRN
  Administered 2020-07-20: 3000 mL

## 2020-07-20 MED ORDER — ONDANSETRON HCL 4 MG/2ML IJ SOLN: 4.0000 mg | Freq: Once | INTRAMUSCULAR | Status: DC | PRN

## 2020-07-20 MED ORDER — OXYCODONE HCL 5 MG/5ML PO SOLN: 5.0000 mg | Freq: Once | ORAL | Status: DC | PRN

## 2020-07-20 MED ORDER — SODIUM CHLORIDE 0.9 % IV SOLN
1.0000 g | INTRAVENOUS | Status: DC
  Filled 2020-07-20: qty 10

## 2020-07-20 MED ORDER — IOHEXOL 300 MG/ML  SOLN
INTRAMUSCULAR | Status: DC | PRN
  Administered 2020-07-20: 10 mL via URETHRAL

## 2020-07-20 MED ORDER — DEXAMETHASONE SODIUM PHOSPHATE 10 MG/ML IJ SOLN
INTRAMUSCULAR | Status: DC | PRN
  Administered 2020-07-20: 10 mg via INTRAVENOUS

## 2020-07-20 SURGICAL SUPPLY — 15 items
BAG DRN RND TRDRP ANRFLXCHMBR (UROLOGICAL SUPPLIES) ×1
BAG URINE DRAIN 2000ML AR STRL (UROLOGICAL SUPPLIES) ×1 IMPLANT
BAG URO CATCHER STRL LF (MISCELLANEOUS) ×2 IMPLANT
CATH FOLEY 2W COUNCIL 5CC 18FR (CATHETERS) ×1 IMPLANT
CATH URET 5FR 28IN OPEN ENDED (CATHETERS) ×2 IMPLANT
CLOTH BEACON ORANGE TIMEOUT ST (SAFETY) ×2 IMPLANT
GLOVE SURG ENC TEXT LTX SZ7.5 (GLOVE) ×2 IMPLANT
GOWN STRL REUS W/TWL XL LVL3 (GOWN DISPOSABLE) ×2 IMPLANT
GUIDEWIRE STR DUAL SENSOR (WIRE) IMPLANT
GUIDEWIRE ZIPWRE .038 STRAIGHT (WIRE) ×2 IMPLANT
KIT TURNOVER KIT A (KITS) ×2 IMPLANT
MANIFOLD NEPTUNE II (INSTRUMENTS) ×2 IMPLANT
PACK CYSTO (CUSTOM PROCEDURE TRAY) ×2 IMPLANT
STENT URET 6FRX26 CONTOUR (STENTS) ×1 IMPLANT
TUBING CONNECTING 10 (TUBING) ×2 IMPLANT

## 2020-07-20 NOTE — H&P (Signed)
History and Physical    [name] LKT:[id number] DOB: 11-14-1940 DOA: 07/20/2020  PCP: Milford Cage, PA  Patient coming from: Home via EMS   Chief Complaint:  Chief Complaint  Patient presents with   Altered Mental Status     HPI:    80 year old male with past medical history of hypertension, left eye glaucoma with chronic blindness, history of prostate cancer (s/p IMRT in 2014), chronic kidney disease stage IIIb and history of multiple complicated urinary tract infections who presents to [facility] emergency department via EMS due to abdominal pain and confusion.  Patient is a somewhat poor historian due to waxing and waning confusion.  History is been obtained both from the patient, discussion with emergency department staff, discussion with urology and review of EMS notes.  I have attempted to contact the wife via phone call for further history but failed.  Patient has been experiencing progressively worsening lower abdominal pain for the past several weeks.  Patient describes his abdominal pain is waxing and waning, moderate in intensity, sharp in quality and associated with intermittent dysuria.  Patient explains that his pain is regularly radiating to his lower back.  Patient complains of nausea but denies vomiting.  Patient denies recent travel, sick contacts or contact with confirmed COVID-19 infection.  Patient has been experiencing progressively worsening weakness lethargy and poor appetite as a result.  Of note, the patient was recently diagnosed with ESBL E. coli urinary tract infection and completed a course of antibiotics for it.  Patient presented to see his urologist on 7/18 with his spouse for evaluation due to progressively worsening symptoms.  During that visit a urinalysis was obtained and the patient was informed that he likely had a urinary tract infection.  Patient was prescribed a course of Augmentin and sent home with his spouse.  Upon arriving  home patient did take 1 dose of antibiotics but continued to exhibit progressively worsening weakness lethargy and confusion prompting the wife to contact EMS.  Patient was promptly evaluated and brought to [facility] emergency department for evaluation.  Upon evaluation in the emergency department patient was found to exhibit multiple SIRS criteria in the setting of suspected urinary tract infection along with having been found to have lactic acidosis of 2.0 and mild acute kidney injury superimposed on his chronic kidney disease.  Patient was felt to be suffering from sepsis and was initiated on intravenous meropenem as well as intravenous fluids with lactated Ringer solution.  CT imaging of the abdomen was performed revealing an 8 mm stone in the right UPJ that may be obstructing with evidence of right hydronephrosis and perinephric edema.  Urology was consulted by the emergency department staff who promptly came to evaluate the patient in the emergency department with plan to take patient to the operating room later this evening with medicine to consult for intravenous antibiotics.  The hospitalist group was then called to assess the patient for admission to the hospital.   Review of Systems:   ROS  Past Medical History:  Diagnosis Date   Anxiety    Arthritis    Bilateral flank pain    DUE TO KIDNEY STONES   Chronic back pain    Dyslipidemia    Eczema    Elevated PSA 01/11/2012   25.77   Frequency of urination    Glaucoma PT STATES RX RAN OUT   Glaucoma    Hematuria    Herniated disc    History of kidney  stones    History of radiation therapy 06/07/12-08/02/12   prostate, 7800 cGy/40 sessions/ 5600cGy pelvic lymph nodes/40 sessions   History of shingles    History of TIA (transient ischemic attack) 2010--  NO RESIDUAL   NONE SINCE   Hypertension    Nocturia    Prostate cancer ([place]) 03/2012   Renal calculi BILATERAL    Urgency of urination    Weakness of both legs     Past  Surgical History:  Procedure Laterality Date   BILIARY STENT PLACEMENT  06/15/2011   Procedure: BILIARY STENT PLACEMENT;  Surgeon: Franchot Gallo, MD;  Location: [facility];  Service: Urology;  Laterality: Bilateral;   CYSTO/ BILATERAL RETROGRADE PYELOGRAM/ LEFT URETERSCOPIC STONE EXTRACTION  11-14-2005   LEFT URETERAL CALCULUS   CYSTO/ BILATERAL URETERAL STENTS  11-06-2005   CYSTO/ LEFT RETROGRADE PYELOGRAM/  LEFT STENT PLACMENT  05-21-2011   CYSTOSCOPY W/ URETERAL STENT REMOVAL  06/15/2011   Procedure: CYSTOSCOPY WITH STENT REMOVAL;  Surgeon: Franchot Gallo, MD;  Location: [facility];  Service: Urology;  Laterality: Left;   EXTRACORPOREAL SHOCK WAVE LITHOTRIPSY     INTERNAL URETHROTOMY/ TURP  12-23-2002   BPH W/ STRICTURE   PERCUTANEOUS NEPHROLITHOTOMY  12-10-2007  &  11-07-2005   RIGHT RENAL CALCULI   REPAIR LEFT INGUINAL HERNIA W/ MESH  12-23-2002   RIGHT URETERAL STENT PLACEMENT  02-10-2003   RIGHT URETEROSCOPIC STONE EXTRACTION  05-29-2003   TRANSTHORACIC ECHOCARDIOGRAM  12-19-2008   LVSF NORMAL/ EF 12-45%/ GRADE I DIASTOLIC DYSFUNCTION/    TRANSURETHRAL RESECTION OF BLADDER  2004   URETEROSCOPY  06/15/2011   Procedure: URETEROSCOPY;  Surgeon: Franchot Gallo, MD;  Location: [facility];  Service: Urology;  Laterality: Bilateral;     reports that he quit smoking about 30 years ago. His smoking use included cigarettes. He has never used smokeless tobacco. He reports that he does not drink alcohol and does not use drugs.  Allergies  Allergen Reactions   Tape Other (See Comments)    Redness and blisters/paper tape-not latex   Doxycycline Hyclate Other (See Comments)    Mental changes    Sulfa Antibiotics Hives    Family History  Problem Relation Age of Onset   Hypertension Mother    Stroke Father    Hypertension Sister    Hypertension Brother      Prior to Admission medications   Medication Sig Start Date End Date  Taking? Authorizing Provider  acetaminophen (TYLENOL) 325 MG tablet Take 2 tablets (650 mg total) by mouth every 6 (six) hours as needed for mild pain (or Fever >/= 101). 11/03/19   Nita Sells, MD  ALPHAGAN P 0.1 % SOLN Place 1 drop into the left eye every 8 (eight) hours as needed (eye pressure).  09/25/19   [provider]  ALPRAZolam Duanne Moron) 0.5 MG tablet Take 0.5 mg by mouth 2 (two) times daily as needed for anxiety.  10/01/19   [provider]  amLODipine (NORVASC) 10 MG tablet Take 1 tablet (10 mg total) by mouth daily. 03/16/17   Jaynee Eagles, PA-C  aspirin 81 MG EC tablet Take 1 tablet (81 mg total) by mouth daily. Patient not taking: Reported on 12/27/2017 06/29/17   Dessa Phi, DO  atenolol (TENORMIN) 50 MG tablet Take 1 tablet (50 mg total) by mouth daily. Pt needs appointment for further refills 10/22/17   Forrest Moron, MD  HYDROcodone-acetaminophen (NORCO) 10-325 MG per tablet Take 1 tablet by mouth 4 (four)  times daily as needed for moderate pain or severe pain.  02/12/14   [provider]  polyethylene glycol (MIRALAX / GLYCOLAX) 17 g packet Take 17 g by mouth daily as needed for mild constipation (For mild constipation not relieved by Senokot). 11/03/19   Nita Sells, MD  ROCKLATAN 0.02-0.005 % SOLN Apply 1 drop to eye at bedtime. 09/24/19   [provider]  timolol (TIMOPTIC) 0.5 % ophthalmic solution Place 1 drop into both eyes 2 (two) times daily as needed (pressure).  01/11/17   [provider]    Physical Exam: Vitals:   07/20/20 2100 07/20/20 2115 07/20/20 2140 07/20/20 2200  BP: 115/75  (!) 106/58 109/70  Pulse: 99 93 83 80  Resp: 20 15 19 16   Temp: 98.2 F (36.8 C)     TempSrc: Oral     SpO2: 98% 97% 100% 100%  Weight:      Height:        Constitutional: Patient is lethargic but arousable and oriented x3 currently, no associated distress.   Skin: no rashes, no lesions, poor skin turgor noted. Eyes:  Pupils are equally reactive to light.  No evidence of scleral icterus or conjunctival pallor.  ENMT: Dry mucous membranes noted.  Posterior pharynx clear of any exudate or lesions.   Neck: normal, supple, no masses, no thyromegaly.  No evidence of jugular venous distension.   Respiratory: clear to auscultation bilaterally, no wheezing, no crackles. Normal respiratory effort. No accessory muscle use.  Cardiovascular: Tachycardic rate with regular rhythm, no murmurs / rubs / gallops. No extremity edema. 2+ pedal pulses. No carotid bruits.  Chest:   Nontender without crepitus or deformity.   Back:   Nontender without crepitus or deformity. Abdomen: Notable lower abdominal tenderness.  Abdomen is soft however.  No evidence of intra-abdominal masses.  Positive bowel sounds noted in all quadrants.   Musculoskeletal: No joint deformity upper and lower extremities. Good ROM, no contractures. Normal muscle tone.  Neurologic: Patient is lethargic arousable and currently oriented x3 although patient has been exhibiting bouts of intermittent confusion throughout emergency department stay.  CN 2-12 grossly intact. Sensation intact.  Patient moving all 4 extremities spontaneously.  Patient is following all commands.  Patient is responsive to verbal stimuli.   Psychiatric: Patient exhibits normal mood with flat affect.  Patient currently does not seem to possess insight as to his current situation.   Labs on Admission: I have personally reviewed following labs and imaging studies -   CBC: Recent Labs  Lab 07/20/20 1736  WBC 7.9  NEUTROABS 7.6  HGB 10.1*  HCT 32.2*  MCV 104.5*  PLT 95*   Basic Metabolic Panel: Recent Labs  Lab 07/20/20 1736  NA 135  K 4.1  CL 103  CO2 24  GLUCOSE 148*  BUN 27*  CREATININE 2.61*  CALCIUM 9.0   GFR: Estimated Creatinine Clearance: 25.2 mL/min (A) (by C-G formula based on SCr of 2.61 mg/dL (H)). Liver Function Tests: Recent Labs  Lab 07/20/20 1736  AST 21   ALT 15  ALKPHOS 55  BILITOT 0.7  PROT 7.6  ALBUMIN 3.5   No results for input(s): LIPASE, AMYLASE in the last 168 hours. No results for input(s): AMMONIA in the last 168 hours. Coagulation Profile: Recent Labs  Lab 07/20/20 1736  INR 1.5*   Cardiac Enzymes: No results for input(s): CKTOTAL, CKMB, CKMBINDEX, TROPONINI in the last 168 hours. BNP (last 3 results) No results for input(s): PROBNP in the last 8760  hours. HbA1C: No results for input(s): HGBA1C in the last 72 hours. CBG: No results for input(s): GLUCAP in the last 168 hours. Lipid Profile: No results for input(s): CHOL, HDL, LDLCALC, TRIG, CHOLHDL, LDLDIRECT in the last 72 hours. Thyroid Function Tests: No results for input(s): TSH, T4TOTAL, FREET4, T3FREE, THYROIDAB in the last 72 hours. Anemia Panel: No results for input(s): VITAMINB12, FOLATE, FERRITIN, TIBC, IRON, RETICCTPCT in the last 72 hours. Urine analysis:    Component Value Date/Time   COLORURINE YELLOW 07/20/2020 2100   APPEARANCEUR HAZY (A) 07/20/2020 2100   LABSPEC 1.023 07/20/2020 2100   PHURINE 5.0 07/20/2020 2100   GLUCOSEU NEGATIVE 07/20/2020 2100   HGBUR MODERATE (A) 07/20/2020 2100   BILIRUBINUR NEGATIVE 07/20/2020 2100   KETONESUR 5 (A) 07/20/2020 2100   PROTEINUR 100 (A) 07/20/2020 2100   UROBILINOGEN 0.2 09/24/2014 1336   NITRITE NEGATIVE 07/20/2020 2100   LEUKOCYTESUR LARGE (A) 07/20/2020 2100    Radiological Exams on Admission - Personally Reviewed: DG Chest Port 1 View  Result Date: 07/20/2020 CLINICAL DATA:  Questionable sepsis - evaluate for abnormality Altered mental status.  Urinary tract infection. EXAM: PORTABLE CHEST 1 VIEW COMPARISON:  10/26/2019 FINDINGS: Low lung volumes. Normal heart size and mediastinal contours. Minor left lung base atelectasis without confluent airspace disease. No pleural effusion or pneumothorax. No pulmonary edema. No acute osseous abnormalities are seen IMPRESSION: Low lung volumes with minor left  lung base atelectasis. Electronically Signed   By: Keith Rake M.D.   On: 07/20/2020 18:01   CT Renal Stone Study  Result Date: 07/20/2020 CLINICAL DATA:  Sepsis ETI. History of kidney stones. Mild lower abdominal pain EXAM: CT ABDOMEN AND PELVIS WITHOUT CONTRAST TECHNIQUE: Multidetector CT imaging of the abdomen and pelvis was performed following the standard protocol without IV contrast. COMPARISON:  CT 10/25/2019 FINDINGS: Lower chest: Minor basilar atelectasis without confluent airspace disease or pleural effusion normal heart size. Hepatobiliary: Multiple scattered hepatic cysts are similar to prior exam. Partially distended gallbladder without gallstone or pericholecystic fat stranding. There is no biliary dilatation. Pancreas: No ductal dilatation or inflammation. Spleen: Normal in size without focal abnormality. Adrenals/Urinary Tract: No adrenal nodule. There is thinning of the right renal parenchyma. Moderate right hydronephrosis. 12 mm stone in the right renal pelvis appears nonobstructing, however there is an 8 mm stone in the ureteropelvic junction that may be obstructing. There is mild right perinephric edema. Mild prominence of the right ureter but no additional ureteral stones. Calcification in the right pelvis represents a phlebolith and is inferolateral to the ureteral insertion. There are no left renal calculi. Slight scarring in the left kidney. No left hydronephrosis. No left perinephric edema. Minimally distended urinary bladder with mild diffuse bladder wall thickening. Stomach/Bowel: Stomach distended with ingested contents/fluid. There is no small bowel obstruction or inflammation. Short appendix tentatively visualized. No appendicitis. Moderate colonic stool burden. There is colonic tortuosity. Diverticulosis of the distal descending and sigmoid colon without diverticulitis. Vascular/Lymphatic: Mild to moderate aortic atherosclerosis. No aortic aneurysm. 10 mm right retroperitoneal  lymph node at the level of the lower right kidney, new from prior exam and likely reactive. There are additional scattered retroperitoneal nodes are not enlarged by size criteria. No pelvic adenopathy. Reproductive: Surgical clip or brachytherapy seeds in the prostate. Prostate gland is poorly defined. Other: No free air or ascites. Moderate-sized fat containing supra umbilical hernia contains only fat. There is a fat containing right inguinal hernia that contains small amount of free fluid Musculoskeletal: There are no acute  or suspicious osseous abnormalities. Hemi transitional lumbosacral anatomy. Multilevel lumbar spondylosis. IMPRESSION: 1. Moderate right hydronephrosis with perinephric edema. An 8 mm stone in the right ureteropelvic junction may be obstructing. Mild right ureteral prominence is similar to prior exam, without distal ureteric stone. 2. Additional nonobstructing 12 mm stone in the right renal pelvis. 3. Mild diffuse bladder wall thickening, can be seen with urinary tract infection. 4. Colonic diverticulosis without diverticulitis. 5. Fat containing supra umbilical and right inguinal hernias. Aortic Atherosclerosis (ICD10-I70.0). Electronically Signed   By: Keith Rake M.D.   On: 07/20/2020 18:19    EKG: Personally reviewed.  Rhythm is sinus tachycardia with heart rate of 111 bpm.  No dynamic ST segment changes appreciated.  Assessment/Plan Principal Problem:   Sepsis secondary to UTI complicated by right UPJ stone with right-sided hydronephrosis  Patient presenting with recent diagnosis of urinary tract infection.  Patient exhibiting multiple SIRS criteria including evidence of organ dysfunction with lactic acidosis and acute kidney injury superimposed on chronic kidney disease all concerning for developing sepsis. Patient been placed on broad-spectrum intravenous antibiotics with meropenem due to recent history of ESBL E. coli according to my discussion with urology Urinalysis,  urine culture and blood cultures obtained here Aggressive intravenous volume resuscitation with isotonic fluids Patient is to undergo intervention of right UPJ stone with associated moderate right hydronephrosis with operative intervention this evening by urology Close, monitoring for hemodynamic instability  Active Problems:   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[place])  Acute kidney injury felt to be secondary to volume depletion, urinary tract infection and ureteral stone. Hydrate patient with intravenous isotonic fluids Treating underlying infection Strict input and output monitoring Avoiding nephrotoxic agents if at all possible Monitoring renal function and electrolytes with serial chemistries    Lactic acidosis  Thought to be secondary to underlying infection and volume depletion Treating underlying infection with intravenous antibiotics we will hydrate patient with intravenous isotonic fluid Performing serial lactic acid levels to ensure downtrending and resolution    Acute metabolic encephalopathy  Waxing waning encephalopathy secondary to underlying infection and volume depletion Treating underlying infection with antibiotics we will hydrate patient with intravenous isotonic fluids Encephalopathy expected to spontaneously resolve with treatment If it fails to resolve we will expand work-up    Essential hypertension  Conservative management considering concern for potential hypotension As needed intravenous antihypertensives for markedly elevated blood pressure    History of prostate cancer  Remote history in 2014, continue outpatient follow-up   Code Status:  Full code Family Communication: I have attempted to contact the wife via phone call using the contact information on the facesheet and has been unsuccessful.  Status is: Inpatient  Remains inpatient appropriate because:Ongoing diagnostic testing needed not appropriate for  outpatient work up, IV treatments appropriate due to intensity of illness or inability to take PO, and Inpatient level of care appropriate due to severity of illness  Dispo: The patient is from: Home              Anticipated d/c is to: Home              Patient currently is not medically stable to d/c.   Difficult to place patient No        Vernelle Emerald MD [facility] Pager [phone]  If 7PM-7AM, please contact night-coverage [email or web address] Use universal [place] password for that web site. If you do not have the password, please call the hospital operator.  07/20/2020, 11:01 PM

## 2020-07-20 NOTE — ED Notes (Signed)
Pt transported to PACU 

## 2020-07-20 NOTE — ED Notes (Signed)
Pt ambulatory to the bathroom Hat made available for urine sample Pt urinated just a few drops, not enough for sample Will attempt for another sample when pt has to use the restroom

## 2020-07-20 NOTE — ED Notes (Signed)
Received V/O from [place] to bladder scan and perform an I/O cath for urine sample

## 2020-07-20 NOTE — ED Triage Notes (Signed)
Pt BIB EMS from home. PCP dx pt with UTI earlier today. Wife reports AMS after appointment. Pt is tachy, tachypneic, and febrile. 1G tylenol given PTA.

## 2020-07-20 NOTE — ED Provider Notes (Signed)
[facility] DEPT Provider Note   CSN: [id number] Arrival date & time: 07/20/20  1723     History Chief Complaint  Patient presents with   Altered Mental Status    [name] is a 80 y.o. male.  The history is provided by the patient, the EMS personnel and medical records.  [name] is a 80 y.o. male who presents to the Emergency Department complaining of sepsis.  He presents to the ED by EMS from home for evaluation of possible sepsis.  EMS reports that he was seen by PCP earlier today and diagnosed with a UTI.  He went home and his wife went to pick up his abx.  When she returned home he was confused compared to baseline and diaphoretic. EMS reports temp to 102.7.  He received acetaminophen 1gm and 500cc NS prior to ED arrival.   Pt reports lower abdominal discomfort and dysuria for several days.  He does not currently feel confused but does not recall earlier events that brought him to the ED.  Denies CP, sob, cough, N/V.  He does report diarrhea.      Past Medical History:  Diagnosis Date   Anxiety    Arthritis    Bilateral flank pain    DUE TO KIDNEY STONES   Chronic back pain    Dyslipidemia    Eczema    Elevated PSA 01/11/2012   25.77   Frequency of urination    Glaucoma PT STATES RX RAN OUT   Glaucoma    Hematuria    Herniated disc    History of kidney stones    History of radiation therapy 06/07/12-08/02/12   prostate, 7800 cGy/40 sessions/ 5600cGy pelvic lymph nodes/40 sessions   History of shingles    History of TIA (transient ischemic attack) 2010--  NO RESIDUAL   NONE SINCE   Hypertension    Nocturia    Prostate cancer ([place]) 03/2012   Renal calculi BILATERAL    Urgency of urination    Weakness of both legs     Patient Active Problem List   Diagnosis Date Noted   Right ureteral stone 07/20/2020   Hydronephrosis of right kidney 74/12/8784   Acute metabolic encephalopathy 76/72/0947   Lactic acidosis 10/26/2019    Macrocytic anemia 10/26/2019   Acute urinary retention 10/26/2019   Sepsis secondary to UTI ([place]) 10/25/2019   Kidney stones 10/19/2019   History of prostate cancer 10/19/2019   Pyelonephritis of right kidney 09/62/8366   Complicated UTI (urinary tract infection) 06/26/2017   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[place]) 06/26/2017   TIA (transient ischemic attack) 06/26/2017   Anxiety 06/26/2017   Prostate cancer ([place]) 05/01/2012   Other and unspecified hyperlipidemia 04/12/2012   Chest pain, unspecified 04/12/2012   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   Essential hypertension 04/11/2012    Past Surgical History:  Procedure Laterality Date   BILIARY STENT PLACEMENT  06/15/2011   Procedure: BILIARY STENT PLACEMENT;  Surgeon: Franchot Gallo, MD;  Location: [facility];  Service: Urology;  Laterality: Bilateral;   CYSTO/ BILATERAL RETROGRADE PYELOGRAM/ LEFT URETERSCOPIC STONE EXTRACTION  11-14-2005   LEFT URETERAL CALCULUS   CYSTO/ BILATERAL URETERAL STENTS  11-06-2005   CYSTO/ LEFT RETROGRADE PYELOGRAM/  LEFT STENT PLACMENT  05-21-2011   CYSTOSCOPY W/ URETERAL STENT REMOVAL  06/15/2011   Procedure: CYSTOSCOPY WITH STENT REMOVAL;  Surgeon: Franchot Gallo, MD;  Location: [facility];  Service: Urology;  Laterality: Left;   EXTRACORPOREAL SHOCK WAVE LITHOTRIPSY     INTERNAL URETHROTOMY/ TURP  12-23-2002   BPH W/ STRICTURE   PERCUTANEOUS NEPHROLITHOTOMY  12-10-2007  &  11-07-2005   RIGHT RENAL CALCULI   REPAIR LEFT INGUINAL HERNIA W/ MESH  12-23-2002   RIGHT URETERAL STENT PLACEMENT  02-10-2003   RIGHT URETEROSCOPIC STONE EXTRACTION  05-29-2003   TRANSTHORACIC ECHOCARDIOGRAM  12-19-2008   LVSF NORMAL/ EF 44-01%/ GRADE I DIASTOLIC DYSFUNCTION/    TRANSURETHRAL RESECTION OF BLADDER  2004   URETEROSCOPY  06/15/2011   Procedure: URETEROSCOPY;  Surgeon: Franchot Gallo, MD;  Location: [facility];  Service:  Urology;  Laterality: Bilateral;       Family History  Problem Relation Age of Onset   Hypertension Mother    Stroke Father    Hypertension Sister    Hypertension Brother     Social History   Tobacco Use   Smoking status: Former    Years: 40.00    Types: Cigarettes    Quit date: 06/10/1990    Years since quitting: 30.1   Smokeless tobacco: Never  Substance Use Topics   Alcohol use: No   Drug use: No    Home Medications Prior to Admission medications   Medication Sig Start Date End Date Taking? Authorizing Provider  acetaminophen (TYLENOL) 325 MG tablet Take 2 tablets (650 mg total) by mouth every 6 (six) hours as needed for mild pain (or Fever >/= 101). 11/03/19   Nita Sells, MD  ALPHAGAN P 0.1 % SOLN Place 1 drop into the left eye every 8 (eight) hours as needed (eye pressure).  09/25/19   [provider]  ALPRAZolam Duanne Moron) 0.5 MG tablet Take 0.5 mg by mouth 2 (two) times daily as needed for anxiety.  10/01/19   [provider]  amLODipine (NORVASC) 10 MG tablet Take 1 tablet (10 mg total) by mouth daily. 03/16/17   Jaynee Eagles, PA-C  aspirin 81 MG EC tablet Take 1 tablet (81 mg total) by mouth daily. Patient not taking: Reported on 12/27/2017 06/29/17   Dessa Phi, DO  atenolol (TENORMIN) 50 MG tablet Take 1 tablet (50 mg total) by mouth daily. Pt needs appointment for further refills 10/22/17   Forrest Moron, MD  HYDROcodone-acetaminophen (NORCO) 10-325 MG per tablet Take 1 tablet by mouth 4 (four) times daily as needed for moderate pain or severe pain.  02/12/14   [provider]  polyethylene glycol (MIRALAX / GLYCOLAX) 17 g packet Take 17 g by mouth daily as needed for mild constipation (For mild constipation not relieved by Senokot). 11/03/19   Nita Sells, MD  ROCKLATAN 0.02-0.005 % SOLN Apply 1 drop to eye at bedtime. 09/24/19   [provider]  timolol (TIMOPTIC) 0.5 % ophthalmic solution Place 1 drop into both  eyes 2 (two) times daily as needed (pressure).  01/11/17   [provider]    Allergies    Tape, Doxycycline hyclate, and Sulfa antibiotics  Review of Systems   Review of Systems  All other systems reviewed and are negative.  Physical Exam Updated Vital Signs BP 140/86   Pulse 78   Temp 97.6 F (36.4 C)   Resp 14   Ht 6' (1.829 m)   Wt 84.8 kg   SpO2 99%   BMI 25.36 kg/m   Physical Exam Vitals and nursing note reviewed.  Constitutional:      Appearance: He is well-developed.  HENT:     Head: Normocephalic and  atraumatic.  Cardiovascular:     Rate and Rhythm: Regular rhythm. Tachycardia present.     Heart sounds: No murmur heard. Pulmonary:     Effort: Pulmonary effort is normal. No respiratory distress.     Breath sounds: Normal breath sounds.  Abdominal:     Palpations: Abdomen is soft.     Tenderness: There is no abdominal tenderness. There is no guarding or rebound.  Musculoskeletal:        General: No tenderness.  Skin:    General: Skin is warm and dry.  Neurological:     Mental Status: He is alert and oriented to person, place, and time.     Comments: Generalized weakness  Psychiatric:        Behavior: Behavior normal.    ED Results / Procedures / Treatments   Labs (all labs ordered are listed, but only abnormal results are displayed) Labs Reviewed  LACTIC ACID, PLASMA - Abnormal; Notable for the following components:      Result Value   Lactic Acid, Venous 2.0 (*)    All other components within normal limits  COMPREHENSIVE METABOLIC PANEL - Abnormal; Notable for the following components:   Glucose, Bld 148 (*)    BUN 27 (*)    Creatinine, Ser 2.61 (*)    GFR, Estimated 24 (*)    All other components within normal limits  CBC WITH DIFFERENTIAL/PLATELET - Abnormal; Notable for the following components:   RBC 3.08 (*)    Hemoglobin 10.1 (*)    HCT 32.2 (*)    MCV 104.5 (*)    Platelets 95 (*)    Lymphs Abs 0.1 (*)    Abs Immature  Granulocytes 0.10 (*)    All other components within normal limits  PROTIME-INR - Abnormal; Notable for the following components:   Prothrombin Time 18.3 (*)    INR 1.5 (*)    All other components within normal limits  APTT - Abnormal; Notable for the following components:   aPTT 37 (*)    All other components within normal limits  URINALYSIS, ROUTINE W REFLEX MICROSCOPIC - Abnormal; Notable for the following components:   APPearance HAZY (*)    Hgb urine dipstick MODERATE (*)    Ketones, ur 5 (*)    Protein, ur 100 (*)    Leukocytes,Ua LARGE (*)    Bacteria, UA RARE (*)    All other components within normal limits  RESP PANEL BY RT-PCR (FLU A&B, COVID) ARPGX2  CULTURE, BLOOD (ROUTINE X 2)  CULTURE, BLOOD (ROUTINE X 2)  URINE CULTURE  URINE CULTURE  LACTIC ACID, PLASMA    EKG None  Radiology DG Chest Port 1 View  Result Date: 07/20/2020 CLINICAL DATA:  Questionable sepsis - evaluate for abnormality Altered mental status.  Urinary tract infection. EXAM: PORTABLE CHEST 1 VIEW COMPARISON:  10/26/2019 FINDINGS: Low lung volumes. Normal heart size and mediastinal contours. Minor left lung base atelectasis without confluent airspace disease. No pleural effusion or pneumothorax. No pulmonary edema. No acute osseous abnormalities are seen IMPRESSION: Low lung volumes with minor left lung base atelectasis. Electronically Signed   By: Keith Rake M.D.   On: 07/20/2020 18:01   DG C-Arm 1-60 Min-No Report  Result Date: 07/20/2020 Fluoroscopy was utilized by the requesting physician.  No radiographic interpretation.   CT Renal Stone Study  Result Date: 07/20/2020 CLINICAL DATA:  Sepsis ETI. History of kidney stones. Mild lower abdominal pain EXAM: CT ABDOMEN AND PELVIS WITHOUT CONTRAST TECHNIQUE: Multidetector CT imaging  of the abdomen and pelvis was performed following the standard protocol without IV contrast. COMPARISON:  CT 10/25/2019 FINDINGS: Lower chest: Minor basilar  atelectasis without confluent airspace disease or pleural effusion normal heart size. Hepatobiliary: Multiple scattered hepatic cysts are similar to prior exam. Partially distended gallbladder without gallstone or pericholecystic fat stranding. There is no biliary dilatation. Pancreas: No ductal dilatation or inflammation. Spleen: Normal in size without focal abnormality. Adrenals/Urinary Tract: No adrenal nodule. There is thinning of the right renal parenchyma. Moderate right hydronephrosis. 12 mm stone in the right renal pelvis appears nonobstructing, however there is an 8 mm stone in the ureteropelvic junction that may be obstructing. There is mild right perinephric edema. Mild prominence of the right ureter but no additional ureteral stones. Calcification in the right pelvis represents a phlebolith and is inferolateral to the ureteral insertion. There are no left renal calculi. Slight scarring in the left kidney. No left hydronephrosis. No left perinephric edema. Minimally distended urinary bladder with mild diffuse bladder wall thickening. Stomach/Bowel: Stomach distended with ingested contents/fluid. There is no small bowel obstruction or inflammation. Short appendix tentatively visualized. No appendicitis. Moderate colonic stool burden. There is colonic tortuosity. Diverticulosis of the distal descending and sigmoid colon without diverticulitis. Vascular/Lymphatic: Mild to moderate aortic atherosclerosis. No aortic aneurysm. 10 mm right retroperitoneal lymph node at the level of the lower right kidney, new from prior exam and likely reactive. There are additional scattered retroperitoneal nodes are not enlarged by size criteria. No pelvic adenopathy. Reproductive: Surgical clip or brachytherapy seeds in the prostate. Prostate gland is poorly defined. Other: No free air or ascites. Moderate-sized fat containing supra umbilical hernia contains only fat. There is a fat containing right inguinal hernia that  contains small amount of free fluid Musculoskeletal: There are no acute or suspicious osseous abnormalities. Hemi transitional lumbosacral anatomy. Multilevel lumbar spondylosis. IMPRESSION: 1. Moderate right hydronephrosis with perinephric edema. An 8 mm stone in the right ureteropelvic junction may be obstructing. Mild right ureteral prominence is similar to prior exam, without distal ureteric stone. 2. Additional nonobstructing 12 mm stone in the right renal pelvis. 3. Mild diffuse bladder wall thickening, can be seen with urinary tract infection. 4. Colonic diverticulosis without diverticulitis. 5. Fat containing supra umbilical and right inguinal hernias. Aortic Atherosclerosis (ICD10-I70.0). Electronically Signed   By: Keith Rake M.D.   On: 07/20/2020 18:19    Procedures Procedures  CRITICAL CARE Performed by: Quintella Reichert   Total critical care time: 40 minutes  Critical care time was exclusive of separately billable procedures and treating other patients.  Critical care was necessary to treat or prevent imminent or life-threatening deterioration.  Critical care was time spent personally by me on the following activities: development of treatment plan with patient and/or surrogate as well as nursing, discussions with consultants, evaluation of patient's response to treatment, examination of patient, obtaining history from patient or surrogate, ordering and performing treatments and interventions, ordering and review of laboratory studies, ordering and review of radiographic studies, pulse oximetry and re-evaluation of patient's condition.  Medications Ordered in ED Medications  fentaNYL (SUBLIMAZE) injection 25-50 mcg (has no administration in time range)  oxyCODONE (Oxy IR/ROXICODONE) immediate release tablet 5 mg (has no administration in time range)    Or  oxyCODONE (ROXICODONE) 5 MG/5ML solution 5 mg (has no administration in time range)  ondansetron (ZOFRAN) injection 4 mg  (has no administration in time range)  water for irrigation, sterile for irrigation SOLN (3,000 mLs  Given 07/20/20 2150)  iohexol (OMNIPAQUE) 300 MG/ML solution (10 mLs Urethral Given 07/20/20 2150)  lactated ringers infusion (has no administration in time range)  lactated ringers bolus 1,000 mL (0 mLs Intravenous Stopped 07/20/20 2009)  meropenem (MERREM) 1 g in sodium chloride 0.9 % 100 mL IVPB (0 g Intravenous Stopped 07/20/20 1936)    ED Course  I have reviewed the triage vital signs and the nursing notes.  Pertinent labs & imaging results that were available during my care of the patient were reviewed by me and considered in my medical decision making (see chart for details).    MDM Rules/Calculators/A&P                         patient here for evaluation of fever and altered mental status. He was diagnosed with urinary tract infection earlier today. On ED presentation patient is mildly confused but oriented to person in place. He was treated with IV fluids and antibiotics for presumed urosepsis.  Labs with AKI.  CT scan concerning for obstructing right ureteral stone. Urology consulted for further management - plan to take to the OR for stenting. Medicine consulted for admission for ongoing treatment.  Final Clinical Impression(s) / ED Diagnoses Final diagnoses:  Sepsis with acute renal failure without septic shock, due to unspecified organism, unspecified acute renal failure type [facility])  Acute UTI  Kidney stone    Rx / DC Orders ED Discharge Orders     None        Quintella Reichert, MD 07/20/20 2342

## 2020-07-20 NOTE — ED Notes (Signed)
Messaged pharmacy regarding missing Merrem dose. Med was not sent to ED.

## 2020-07-20 NOTE — Transfer of Care (Signed)
Immediate Anesthesia Transfer of Care Note  Patient: [name]  Procedure(s) Performed: Procedure(s): CYSTOSCOPY WITH RETROGRADE PYELOGRAM/URETERAL STENT PLACEMENT (Right)  Patient Location: PACU  Anesthesia Type:General  Level of Consciousness: Alert, Awake, Oriented  Airway & Oxygen Therapy: Patient Spontanous Breathing  Post-op Assessment: Report given to RN  Post vital signs: Reviewed and stable  Last Vitals:  Vitals:   07/20/20 2140 07/20/20 2200  BP: (!) 106/58 109/70  Pulse: 83 80  Resp: 19 16  Temp:    SpO2: 841% 660%    Complications: No apparent anesthesia complications

## 2020-07-20 NOTE — ED Notes (Signed)
Attempted bladder scan on pt and results revealed 5 to 10 ml of urine Repeated bladder scan multiple times to ensure accuracy Of note, Pt's brief was urine soaked Pt placed on male purewick to attempt a urine sample Will continue to monitor an if doesn't produce urine in a reasonble time frame will attempt I/O cath

## 2020-07-20 NOTE — Op Note (Signed)
PATIENT:  [name]  Preoperative diagnosis:  Right UPJ stone  Postoperative diagnosis:  Right UPJ stone  Procedure:  Cystoscopy Right ureteral stent placement  Right retrograde pyelography with interpretation   Surgeon: Ellison Hughs, M.D. Assistant:Traycen Goyer Abu-Salha, MD  Anesthesia: General  Complications: None  EBL: Minimal  Specimens: None  Indication: [name] is a 80 y.o. male with right nephrolithiasis, right UPJ stone, UTI. After reviewing the management options for treatment, they have elected to proceed with the above surgical procedure(s). We have discussed the potential benefits and risks of the procedure, side effects of the proposed treatment, the likelihood of the patient achieving the goals of the procedure, and any potential problems that might occur during the procedure or recuperation. Informed consent has been obtained.  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  I did accommodate the scope with some resistance.  Friable prostatic mucosa.  Narrowed right ureteral orifice which did accommodate the Glidewire, catheter, ureteral stent.  Successful right ureteral stent placement.  Foley placement.  Urine sent for culture.  Description of procedure:   The patient was taken to the operating room and general anesthesia was induced.  The patient was placed in the dorsal lithotomy position, prepped and draped in the usual sterile fashion, and preoperative antibiotics were administered. A preoperative time-out was performed.   Cystourethroscopy was performed.  He had evidence of a proximal urethral stricture near the area of the proximal bulbar/membranous urethra which did accommodate the cystoscope with some resistance.  The prostatic urethra was friable.  Urine in the bladder was cloudy.  There was no obvious masses or lesions.  His right ureteral orifice was narrow and somewhat difficult to locate due to  the mild bladder neck distortion by his prostate gland.  However we were ultimately able to locate it and intubate it with a Glidewire.  Attention then turned to the right ureteral orifice and a ureteral catheter was used to intubate the ureteral orifice.  Catheter was advanced into the renal pelvis.  There was not a hydronephrotic drip. Omnipaque contrast was injected through the ureteral catheter and a retrograde pyelogram which revealed the above findings.  A hydrophilic Glidewire was then advanced up the right ureter into the renal pelvis under fluoroscopic guidance.  The wire was then backloaded through the cystoscope and a 6 [place] by 26 cm JJ ureteral stent was advance over the wire using Seldinger technique.  The stent was positioned appropriately under fluoroscopic and cystoscopic guidance.  The wire was then removed with an adequate stent curl noted in the renal pelvis as well as in the bladder.  The bladder was left full and Foley catheter was placed.  Urine specimen was collected and sent for culture.  The patient appeared to tolerate the procedure well and without complications.  The patient was able to be awakened and transferred to the recovery unit in satisfactory condition.   Plan: - Patient will be admitted to the hospitalist team.  Will be resuscitated and kept on broad antibiotics which will be tailored per his urine culture data.  Labs will be trended. -We will arrange for outpatient definitive stone management -We will continue to follow

## 2020-07-20 NOTE — Progress Notes (Signed)
Elink following for Sepsis Protocol initiated at 17:36

## 2020-07-20 NOTE — ED Notes (Signed)
Patient transported to CT 

## 2020-07-20 NOTE — Anesthesia Preprocedure Evaluation (Signed)
Anesthesia Evaluation  Patient identified by MRN, date of birth, ID band Patient awake    Reviewed: Allergy & Precautions, NPO status , Patient's Chart, lab work & pertinent test results  Airway Mallampati: II  TM Distance: >3 FB Neck ROM: Full    Dental no notable dental hx.    Pulmonary neg pulmonary ROS, former smoker,    Pulmonary exam normal breath sounds clear to auscultation       Cardiovascular hypertension, Normal cardiovascular exam Rhythm:Regular Rate:Normal     Neuro/Psych TIAnegative psych ROS   GI/Hepatic negative GI ROS, Neg liver ROS,   Endo/Other  negative endocrine ROS  Renal/GU Renal InsufficiencyRenal disease  negative genitourinary   Musculoskeletal negative musculoskeletal ROS (+)   Abdominal   Peds negative pediatric ROS (+)  Hematology  (+) anemia ,   Anesthesia Other Findings   Reproductive/Obstetrics negative OB ROS                             Anesthesia Physical Anesthesia Plan  ASA: 3  Anesthesia Plan: General   Post-op Pain Management:    Induction: Intravenous  PONV Risk Score and Plan: 2 and Ondansetron, Dexamethasone and Treatment may vary due to age or medical condition  Airway Management Planned: LMA  Additional Equipment:   Intra-op Plan:   Post-operative Plan: Extubation in OR  Informed Consent: I have reviewed the patients History and Physical, chart, labs and discussed the procedure including the risks, benefits and alternatives for the proposed anesthesia with the patient or authorized representative who has indicated his/her understanding and acceptance.     Dental advisory given  Plan Discussed with: CRNA and Surgeon  Anesthesia Plan Comments:         Anesthesia Quick Evaluation

## 2020-07-20 NOTE — ED Notes (Signed)
PT given urinal will give urine sample when able to.

## 2020-07-20 NOTE — Consult Note (Addendum)
Urology Consult Note   Requesting Attending Physician:  Quintella Reichert, MD Service Providing Consult: Urology   Reason for Consult:  Nephrolithiasis  HPI: [name] is seen in consultation for reasons noted above at the request of Quintella Reichert, MD or evaluation of AMS, nephrolithiasis, urosepsis.  80yo M with PMH Prostate Cancer s/p IMRT in 2014 with most recent PSA <.015 and a history of nephrolithiasis who presented to urologists office today due to worsening back pain and LUTS. UA there with bacteria, LE +, leukocytes, nitrite negative. Given augmentin (previous ESBL E Coli UTI was susceptible) and scheduled for next available CT stone protocol. Presents to ER after feeling subjectively worse at home with chills. Temperature is 37.9 C in ED. Tachycardic to the 100s. normotensive. Labs reveal a WBC of 8.9 and an AKI with creatinine of 2.6 from baseline 1.6. He underwent a CT stone protocol which reveals a right 80mm UPJ stone with proximal hydronephrosis as well as nonobstructing 75mm right renal pelvis stone burden.   He has received Meropenem for antibiotics, a 1L bolus of crystalloid, and he is currently on 150cc/hr of LR.   He notes that he has had symptoms for several days, including decreased PO intake, lower abdominal pain, LUTS.  The patient  denies hamturia, dysuria  . The patient has a history of and chills. Denies CP, N/V, overt fevers at home.  He has a previous history of kidney stones which includes multiopls surgerys in the past including PCNL  He has a history of ESBL E. coli UTI.  It was sensitive to Augmentin, amikacin, carbapenems, tetracyclines, Bactrim.  Resistant to quinolones  Past Medical History: Past Medical History:  Diagnosis Date   Anxiety    Arthritis    Bilateral flank pain    DUE TO KIDNEY STONES   Chronic back pain    Dyslipidemia    Eczema    Elevated PSA 01/11/2012   25.77   Frequency of urination    Glaucoma PT STATES RX RAN OUT   Glaucoma     Hematuria    Herniated disc    History of kidney stones    History of radiation therapy 06/07/12-08/02/12   prostate, 7800 cGy/40 sessions/ 5600cGy pelvic lymph nodes/40 sessions   History of shingles    History of TIA (transient ischemic attack) 2010--  NO RESIDUAL   NONE SINCE   Hypertension    Nocturia    Prostate cancer ([place]) 03/2012   Renal calculi BILATERAL    Urgency of urination    Weakness of both legs     Past Surgical History:  Past Surgical History:  Procedure Laterality Date   BILIARY STENT PLACEMENT  06/15/2011   Procedure: BILIARY STENT PLACEMENT;  Surgeon: Franchot Gallo, MD;  Location: [facility];  Service: Urology;  Laterality: Bilateral;   CYSTO/ BILATERAL RETROGRADE PYELOGRAM/ LEFT URETERSCOPIC STONE EXTRACTION  11-14-2005   LEFT URETERAL CALCULUS   CYSTO/ BILATERAL URETERAL STENTS  11-06-2005   CYSTO/ LEFT RETROGRADE PYELOGRAM/  LEFT STENT PLACMENT  05-21-2011   CYSTOSCOPY W/ URETERAL STENT REMOVAL  06/15/2011   Procedure: CYSTOSCOPY WITH STENT REMOVAL;  Surgeon: Franchot Gallo, MD;  Location: [facility];  Service: Urology;  Laterality: Left;   EXTRACORPOREAL SHOCK WAVE LITHOTRIPSY     INTERNAL URETHROTOMY/ TURP  12-23-2002   BPH W/ STRICTURE   PERCUTANEOUS NEPHROLITHOTOMY  12-10-2007  &  11-07-2005   RIGHT RENAL CALCULI   REPAIR LEFT INGUINAL HERNIA W/ MESH  12-23-2002  RIGHT URETERAL STENT PLACEMENT  02-10-2003   RIGHT URETEROSCOPIC STONE EXTRACTION  05-29-2003   TRANSTHORACIC ECHOCARDIOGRAM  12-19-2008   LVSF NORMAL/ EF 62-83%/ GRADE I DIASTOLIC DYSFUNCTION/    TRANSURETHRAL RESECTION OF BLADDER  2004   URETEROSCOPY  06/15/2011   Procedure: URETEROSCOPY;  Surgeon: Franchot Gallo, MD;  Location: [facility];  Service: Urology;  Laterality: Bilateral;    Medication: Current Facility-Administered Medications  Medication Dose Route Frequency Provider Last Rate Last Admin   lactated ringers infusion    Intravenous Continuous Quintella Reichert, MD 150 mL/hr at 07/20/20 1832 New Bag at 07/20/20 1832   Current Outpatient Medications  Medication Sig Dispense Refill   acetaminophen (TYLENOL) 325 MG tablet Take 2 tablets (650 mg total) by mouth every 6 (six) hours as needed for mild pain (or Fever >/= 101).     ALPHAGAN P 0.1 % SOLN Place 1 drop into the left eye every 8 (eight) hours as needed (eye pressure).      ALPRAZolam (XANAX) 0.5 MG tablet Take 0.5 mg by mouth 2 (two) times daily as needed for anxiety.      amLODipine (NORVASC) 10 MG tablet Take 1 tablet (10 mg total) by mouth daily. 90 tablet 0   aspirin 81 MG EC tablet Take 1 tablet (81 mg total) by mouth daily. (Patient not taking: Reported on 12/27/2017) 30 tablet 1   atenolol (TENORMIN) 50 MG tablet Take 1 tablet (50 mg total) by mouth daily. Pt needs appointment for further refills 30 tablet 0   HYDROcodone-acetaminophen (NORCO) 10-325 MG per tablet Take 1 tablet by mouth 4 (four) times daily as needed for moderate pain or severe pain.   0   polyethylene glycol (MIRALAX / GLYCOLAX) 17 g packet Take 17 g by mouth daily as needed for mild constipation (For mild constipation not relieved by Senokot). 14 each 0   ROCKLATAN 0.02-0.005 % SOLN Apply 1 drop to eye at bedtime.     timolol (TIMOPTIC) 0.5 % ophthalmic solution Place 1 drop into both eyes 2 (two) times daily as needed (pressure).   0    Allergies: Allergies  Allergen Reactions   Tape Other (See Comments)    Redness and blisters/paper tape-not latex   Doxycycline Hyclate Other (See Comments)    Mental changes    Sulfa Antibiotics Hives    Social History: Social History   Tobacco Use   Smoking status: Former    Years: 40.00    Types: Cigarettes    Quit date: 06/10/1990    Years since quitting: 30.1   Smokeless tobacco: Never  Substance Use Topics   Alcohol use: No   Drug use: No    Family History Family History  Problem Relation Age of Onset   Hypertension Mother     Stroke Father    Hypertension Sister    Hypertension Brother     Review of Systems 10 systems were reviewed and are negative except as noted specifically in the HPI.  Objective   Vital signs in last 24 hours: BP 106/75 (BP Location: Left Arm)   Pulse 100   Temp 99.3 F (37.4 C) (Oral)   Resp 17   Ht 6' (1.829 m)   Wt 84.8 kg   SpO2 99%   BMI 25.36 kg/m   Physical Exam General: NAD, A&O, resting, appropriate HEENT: [place]/AT, EOMI, MMM Pulmonary: Normal work of breathing Cardiovascular: HDS, adequate peripheral perfusion Abdomen: Soft, NTTP, nondistended, mildusprapubic tenderness. GU: purewick on to obtain urine specimen,  No significant CVA tenderness Extremities: warm and well perfused Neuro: Appropriate, no focal neurological deficits  Most Recent Labs: Lab Results  Component Value Date   WBC 7.9 07/20/2020   HGB 10.1 (L) 07/20/2020   HCT 32.2 (L) 07/20/2020   PLT 95 (L) 07/20/2020    Lab Results  Component Value Date   NA 135 07/20/2020   K 4.1 07/20/2020   CL 103 07/20/2020   CO2 24 07/20/2020   BUN 27 (H) 07/20/2020   CREATININE 2.61 (H) 07/20/2020   CALCIUM 9.0 07/20/2020   MG 2.0 10/31/2019   PHOS 3.4 10/31/2019    Lab Results  Component Value Date   INR 1.5 (H) 07/20/2020   APTT 37 (H) 07/20/2020     Urine Culture: @[id number] (laburin,org,[id number])@   IMAGING: DG Chest Port 1 View  Result Date: 07/20/2020 CLINICAL DATA:  Questionable sepsis - evaluate for abnormality Altered mental status.  Urinary tract infection. EXAM: PORTABLE CHEST 1 VIEW COMPARISON:  10/26/2019 FINDINGS: Low lung volumes. Normal heart size and mediastinal contours. Minor left lung base atelectasis without confluent airspace disease. No pleural effusion or pneumothorax. No pulmonary edema. No acute osseous abnormalities are seen IMPRESSION: Low lung volumes with minor left lung base atelectasis. Electronically Signed   By: Keith Rake M.D.   On: 07/20/2020 18:01    CT Renal Stone Study  Result Date: 07/20/2020 CLINICAL DATA:  Sepsis ETI. History of kidney stones. Mild lower abdominal pain EXAM: CT ABDOMEN AND PELVIS WITHOUT CONTRAST TECHNIQUE: Multidetector CT imaging of the abdomen and pelvis was performed following the standard protocol without IV contrast. COMPARISON:  CT 10/25/2019 FINDINGS: Lower chest: Minor basilar atelectasis without confluent airspace disease or pleural effusion normal heart size. Hepatobiliary: Multiple scattered hepatic cysts are similar to prior exam. Partially distended gallbladder without gallstone or pericholecystic fat stranding. There is no biliary dilatation. Pancreas: No ductal dilatation or inflammation. Spleen: Normal in size without focal abnormality. Adrenals/Urinary Tract: No adrenal nodule. There is thinning of the right renal parenchyma. Moderate right hydronephrosis. 12 mm stone in the right renal pelvis appears nonobstructing, however there is an 8 mm stone in the ureteropelvic junction that may be obstructing. There is mild right perinephric edema. Mild prominence of the right ureter but no additional ureteral stones. Calcification in the right pelvis represents a phlebolith and is inferolateral to the ureteral insertion. There are no left renal calculi. Slight scarring in the left kidney. No left hydronephrosis. No left perinephric edema. Minimally distended urinary bladder with mild diffuse bladder wall thickening. Stomach/Bowel: Stomach distended with ingested contents/fluid. There is no small bowel obstruction or inflammation. Short appendix tentatively visualized. No appendicitis. Moderate colonic stool burden. There is colonic tortuosity. Diverticulosis of the distal descending and sigmoid colon without diverticulitis. Vascular/Lymphatic: Mild to moderate aortic atherosclerosis. No aortic aneurysm. 10 mm right retroperitoneal lymph node at the level of the lower right kidney, new from prior exam and likely reactive.  There are additional scattered retroperitoneal nodes are not enlarged by size criteria. No pelvic adenopathy. Reproductive: Surgical clip or brachytherapy seeds in the prostate. Prostate gland is poorly defined. Other: No free air or ascites. Moderate-sized fat containing supra umbilical hernia contains only fat. There is a fat containing right inguinal hernia that contains small amount of free fluid Musculoskeletal: There are no acute or suspicious osseous abnormalities. Hemi transitional lumbosacral anatomy. Multilevel lumbar spondylosis. IMPRESSION: 1. Moderate right hydronephrosis with perinephric edema. An 8 mm stone in the right ureteropelvic junction may be obstructing. Mild right ureteral  prominence is similar to prior exam, without distal ureteric stone. 2. Additional nonobstructing 12 mm stone in the right renal pelvis. 3. Mild diffuse bladder wall thickening, can be seen with urinary tract infection. 4. Colonic diverticulosis without diverticulitis. 5. Fat containing supra umbilical and right inguinal hernias. Aortic Atherosclerosis (ICD10-I70.0). Electronically Signed   By: Keith Rake M.D.   On: 07/20/2020 18:19    ------  Assessment:  80 y.o. male with PMH Prostate cancer s/p IMRT and with nephrolihtaisis who presented with AMS, lower abdominal pain, UTI, and CT that demonstrates 59mm obstructing stone with  associated hydronephrosis.   Concerning for infection given: UA at urology office with leukocytes, LE, bacteria. Additionally, low grade fever and tachycardic in ER.  In the ED received: 1 L bolus LR and Meropenem. Now on IVF at 150cc/hr.  We discussed the indications for acute intervention including infected obstruction, bilateral ureteral obstruction or unilateral obstruction of solitary kidney as well as other less urgent indications for decompression which would included intractable pain, N/V, and acute renal injury. We also discussed the possible inability to place a ureteral  stent in which case, the next intervention recommended would be a percutaneous nephrostomy tube. Given concern for infection is present, plan for emergent right ureteral stent placement.   Recommendations: Plan for emergent  right ureteral stent placement Case posted; Urology will discuss with anesthesia Keep patient NPO Consent obtained from patient verbally at bedside and from his wife over the pohne Send urine culture at this time, prior to [place] for antibiotics, tailored per culture data Place Foley for maximal urinary decompression Negative COVID test    Thank you for this consult. Please contact the urology consult pager with any further questions/concerns.

## 2020-07-20 NOTE — Anesthesia Procedure Notes (Signed)
Procedure Name: LMA Insertion Date/Time: 07/20/2020 10:25 PM Performed by: Gerald Leitz, CRNA Pre-anesthesia Checklist: Patient identified, Patient being monitored, Timeout performed, Emergency Drugs available and Suction available Patient Re-evaluated:Patient Re-evaluated prior to induction Oxygen Delivery Method: Circle system utilized Preoxygenation: Pre-oxygenation with 100% oxygen Induction Type: IV induction Ventilation: Mask ventilation without difficulty LMA: LMA inserted LMA Size: 4.0 Tube type: Oral Number of attempts: 1 Placement Confirmation: positive ETCO2 and breath sounds checked- equal and bilateral Tube secured with: Tape Dental Injury: Teeth and Oropharynx as per pre-operative assessment

## 2020-07-20 NOTE — Progress Notes (Signed)
Pharmacy Antibiotic Note  [name] is a 80 y.o. male admitted on 07/20/2020 with sepsis with UTI, and nephrolithiasis.  PMH significant for prostate cancer, CKD, nephrolithiasis, UTI (previous ESBL E Coli UTI). S/P cystoscopy with ureteral stent placement 7/18 pm.  Pharmacy has been consulted for Meropenem dosing.  Patient received Meropenem 1gm IV x 1 @ 18:50.  Plan: Meropenem 500mg  IV q12h Follow renal function and adjust meropenem dose as needed F/U culture results and sensitivities  Height: 6' (182.9 cm) Weight: 84.8 kg (187 lb) IBW/kg (Calculated) : 77.6  Temp (24hrs), Avg:99.3 F (37.4 C), Min:98.2 F (36.8 C), Max:100.3 F (37.9 C)  Recent Labs  Lab 07/20/20 1736 07/20/20 1936  WBC 7.9  --   CREATININE 2.61*  --   LATICACIDVEN 2.0* 1.3    Estimated Creatinine Clearance: 25.2 mL/min (A) (by C-G formula based on SCr of 2.61 mg/dL (H)).    Allergies  Allergen Reactions   Tape Other (See Comments)    Redness and blisters/paper tape-not latex   Doxycycline Hyclate Other (See Comments)    Mental changes    Sulfa Antibiotics Hives    Antimicrobials this admission: 7/18 Meropenem >>    Dose adjustments this admission:    Microbiology results: 7/18 BCx:   7/18 UCx:     Thank you for allowing pharmacy to be a part of this patient's care.  Everette Rank, PharmD 07/20/2020 11:05 PM

## 2020-07-21 ENCOUNTER — Other Ambulatory Visit: Payer: Self-pay

## 2020-07-21 ENCOUNTER — Encounter (HOSPITAL_COMMUNITY): Payer: Self-pay | Admitting: Urology

## 2020-07-21 DIAGNOSIS — N390 Urinary tract infection, site not specified: Secondary | ICD-10-CM | POA: Diagnosis not present

## 2020-07-21 DIAGNOSIS — A419 Sepsis, unspecified organism: Secondary | ICD-10-CM | POA: Diagnosis not present

## 2020-07-21 LAB — CBC WITH DIFFERENTIAL/PLATELET
Abs Immature Granulocytes: 0.1 10*3/uL — ABNORMAL HIGH (ref 0.00–0.07)
Basophils Absolute: 0 10*3/uL (ref 0.0–0.1)
Basophils Relative: 0 %
Eosinophils Absolute: 0 10*3/uL (ref 0.0–0.5)
Eosinophils Relative: 0 %
HCT: 28.6 % — ABNORMAL LOW (ref 39.0–52.0)
Hemoglobin: 8.9 g/dL — ABNORMAL LOW (ref 13.0–17.0)
Immature Granulocytes: 1 %
Lymphocytes Relative: 2 %
Lymphs Abs: 0.2 10*3/uL — ABNORMAL LOW (ref 0.7–4.0)
MCH: 33.1 pg (ref 26.0–34.0)
MCHC: 31.1 g/dL (ref 30.0–36.0)
MCV: 106.3 fL — ABNORMAL HIGH (ref 80.0–100.0)
Monocytes Absolute: 0.3 10*3/uL (ref 0.1–1.0)
Monocytes Relative: 3 %
Neutro Abs: 7.8 10*3/uL — ABNORMAL HIGH (ref 1.7–7.7)
Neutrophils Relative %: 94 %
Platelets: 94 10*3/uL — ABNORMAL LOW (ref 150–400)
RBC: 2.69 MIL/uL — ABNORMAL LOW (ref 4.22–5.81)
RDW: 13.1 % (ref 11.5–15.5)
WBC: 8.3 10*3/uL (ref 4.0–10.5)
nRBC: 0 % (ref 0.0–0.2)

## 2020-07-21 LAB — COMPREHENSIVE METABOLIC PANEL
ALT: 16 U/L (ref 0–44)
AST: 24 U/L (ref 15–41)
Albumin: 3 g/dL — ABNORMAL LOW (ref 3.5–5.0)
Alkaline Phosphatase: 48 U/L (ref 38–126)
Anion gap: 6 (ref 5–15)
BUN: 26 mg/dL — ABNORMAL HIGH (ref 8–23)
CO2: 25 mmol/L (ref 22–32)
Calcium: 8.8 mg/dL — ABNORMAL LOW (ref 8.9–10.3)
Chloride: 108 mmol/L (ref 98–111)
Creatinine, Ser: 2.36 mg/dL — ABNORMAL HIGH (ref 0.61–1.24)
GFR, Estimated: 27 mL/min — ABNORMAL LOW (ref >60)
Glucose, Bld: 159 mg/dL — ABNORMAL HIGH (ref 70–99)
Potassium: 4.9 mmol/L (ref 3.5–5.1)
Sodium: 139 mmol/L (ref 135–145)
Total Bilirubin: 0.6 mg/dL (ref 0.3–1.2)
Total Protein: 6.9 g/dL (ref 6.5–8.1)

## 2020-07-21 LAB — CORTISOL-AM, BLOOD: Cortisol - AM: 17.1 ug/dL (ref 6.7–22.6)

## 2020-07-21 LAB — MAGNESIUM: Magnesium: 2.4 mg/dL (ref 1.7–2.4)

## 2020-07-21 LAB — PROTIME-INR
INR: 1.2 (ref 0.8–1.2)
Prothrombin Time: 15.4 seconds — ABNORMAL HIGH (ref 11.4–15.2)

## 2020-07-21 MED ORDER — LATANOPROST 0.005 % OP SOLN
1.0000 [drp] | Freq: Every day | OPHTHALMIC | Status: DC
  Administered 2020-07-21: 1 [drp] via OPHTHALMIC
  Filled 2020-07-21: qty 2.5

## 2020-07-21 MED ORDER — BOOST / RESOURCE BREEZE PO LIQD CUSTOM
1.0000 | Freq: Two times a day (BID) | ORAL | Status: DC
  Administered 2020-07-21 – 2020-07-23 (×4): 1 via ORAL

## 2020-07-21 MED ORDER — SODIUM CHLORIDE 0.9 % IV SOLN
1.0000 g | Freq: Two times a day (BID) | INTRAVENOUS | Status: DC
  Administered 2020-07-21 – 2020-07-22 (×3): 1 g via INTRAVENOUS
  Filled 2020-07-21 (×5): qty 1

## 2020-07-21 MED ORDER — OXYCODONE HCL 5 MG PO TABS
5.0000 mg | ORAL_TABLET | Freq: Once | ORAL | Status: AC
  Administered 2020-07-21: 5 mg via ORAL
  Filled 2020-07-21: qty 1

## 2020-07-21 MED ORDER — ACETAMINOPHEN 650 MG RE SUPP: 650.0000 mg | Freq: Four times a day (QID) | RECTAL | Status: DC | PRN

## 2020-07-21 MED ORDER — ATENOLOL 50 MG PO TABS
50.0000 mg | ORAL_TABLET | Freq: Every day | ORAL | Status: DC
  Administered 2020-07-21 – 2020-07-23 (×3): 50 mg via ORAL
  Filled 2020-07-21 (×3): qty 1

## 2020-07-21 MED ORDER — ADULT MULTIVITAMIN W/MINERALS CH
1.0000 | ORAL_TABLET | Freq: Every day | ORAL | Status: DC
  Administered 2020-07-21 – 2020-07-23 (×3): 1 via ORAL
  Filled 2020-07-21 (×3): qty 1

## 2020-07-21 MED ORDER — BRIMONIDINE TARTRATE 0.15 % OP SOLN
1.0000 [drp] | Freq: Three times a day (TID) | OPHTHALMIC | Status: DC
  Administered 2020-07-21 – 2020-07-23 (×7): 1 [drp] via OPHTHALMIC
  Filled 2020-07-21: qty 5

## 2020-07-21 MED ORDER — PROSOURCE PLUS PO LIQD
30.0000 mL | Freq: Two times a day (BID) | ORAL | Status: DC
  Administered 2020-07-21 – 2020-07-22 (×3): 30 mL via ORAL
  Filled 2020-07-21 (×4): qty 30

## 2020-07-21 MED ORDER — HYDROCODONE-ACETAMINOPHEN 5-325 MG PO TABS
1.0000 | ORAL_TABLET | ORAL | Status: DC | PRN
  Administered 2020-07-21 – 2020-07-23 (×8): 2 via ORAL
  Filled 2020-07-21 (×8): qty 2

## 2020-07-21 MED ORDER — MORPHINE SULFATE (PF) 2 MG/ML IV SOLN
2.0000 mg | INTRAVENOUS | Status: DC | PRN
  Administered 2020-07-21: 2 mg via INTRAVENOUS
  Filled 2020-07-21: qty 1

## 2020-07-21 MED ORDER — POLYETHYLENE GLYCOL 3350 17 G PO PACK: 17.0000 g | PACK | Freq: Every day | ORAL | Status: DC | PRN

## 2020-07-21 MED ORDER — ONDANSETRON HCL 4 MG PO TABS: 4.0000 mg | ORAL_TABLET | Freq: Four times a day (QID) | ORAL | Status: DC | PRN

## 2020-07-21 MED ORDER — OXYBUTYNIN CHLORIDE 5 MG PO TABS
2.5000 mg | ORAL_TABLET | Freq: Once | ORAL | Status: AC
  Administered 2020-07-21: 2.5 mg via ORAL
  Filled 2020-07-21: qty 1

## 2020-07-21 MED ORDER — ACETAMINOPHEN 325 MG PO TABS
650.0000 mg | ORAL_TABLET | Freq: Four times a day (QID) | ORAL | Status: DC | PRN
  Administered 2020-07-21 (×2): 650 mg via ORAL
  Filled 2020-07-21 (×2): qty 2

## 2020-07-21 MED ORDER — TIMOLOL MALEATE 0.5 % OP SOLN
1.0000 [drp] | Freq: Two times a day (BID) | OPHTHALMIC | Status: DC
  Administered 2020-07-21 – 2020-07-23 (×5): 1 [drp] via OPHTHALMIC
  Filled 2020-07-21: qty 5

## 2020-07-21 MED ORDER — CHLORHEXIDINE GLUCONATE CLOTH 2 % EX PADS
6.0000 | MEDICATED_PAD | Freq: Every day | CUTANEOUS | Status: DC
  Administered 2020-07-21: 6 via TOPICAL

## 2020-07-21 MED ORDER — ONDANSETRON HCL 4 MG/2ML IJ SOLN: 4.0000 mg | Freq: Four times a day (QID) | INTRAMUSCULAR | Status: DC | PRN

## 2020-07-21 MED ORDER — SENNOSIDES-DOCUSATE SODIUM 8.6-50 MG PO TABS
1.0000 | ORAL_TABLET | Freq: Two times a day (BID) | ORAL | Status: DC
  Administered 2020-07-21 – 2020-07-23 (×4): 1 via ORAL
  Filled 2020-07-21 (×4): qty 1

## 2020-07-21 MED ORDER — ALPRAZOLAM 0.5 MG PO TABS
0.5000 mg | ORAL_TABLET | Freq: Two times a day (BID) | ORAL | Status: DC | PRN
  Administered 2020-07-21 – 2020-07-23 (×3): 0.5 mg via ORAL
  Filled 2020-07-21 (×3): qty 1

## 2020-07-21 MED ORDER — NETARSUDIL-LATANOPROST 0.02-0.005 % OP SOLN: 1.0000 [drp] | Freq: Every day | OPHTHALMIC | Status: DC

## 2020-07-21 MED ORDER — ENOXAPARIN SODIUM 30 MG/0.3ML IJ SOSY
30.0000 mg | PREFILLED_SYRINGE | INTRAMUSCULAR | Status: DC
  Filled 2020-07-21: qty 0.3

## 2020-07-21 NOTE — Progress Notes (Signed)
Initial Nutrition Assessment  DOCUMENTATION CODES:   Not applicable  INTERVENTION:  - will order Boost Breeze BID, each supplement provides 250 kcal and 9 grams of protein. - will order 30 ml Prosource Plus BID, each supplement provides 100 kcal and 15 grams protein.  - will order 1 tablet multivitamin with minerals/day.    NUTRITION DIAGNOSIS:   Inadequate oral intake related to acute illness, decreased appetite as evidenced by per patient/family report, meal completion < 50%.  GOAL:   Patient will meet greater than or equal to 90% of their needs  MONITOR:   PO intake, Supplement acceptance, Labs, Weight trends  REASON FOR ASSESSMENT:   Malnutrition Screening Tool  ASSESSMENT:   80 year old male with medical history of HTN, L eye glaucoma with blindness, history of prostate cancer s/p XRT in 2014, stage 3 CKD, and multiple complicated UTIs. He presented to the ED with progressively worsening lower abdominal pain for several weeks and confusion. In the ED he was noted to have AKI and CT abdomen showed 8 mm R UPJ obstructing stone and evidence of hydronephrosis and perinephric edema.  He ate 25% of breakfast and 25% of lunch today. Patient reports that with progressive abdominal pain over the past few weeks it has been difficult to eat and that he has a lack of desire to eat d/t pain. Eating does worsen pain.   He is noted to be POD #1 cystoscopy, R ureteral stent placement, and R retrograde pyelography.  He reports losing 15 lb over the past ~1 month. Weight yesterday was 187 lb and PTA the most recently documented weight was on 11/02/19 when he weighed 182 lb.   Per notes: - sepsis 2/2 complicated UTI and infected obstructive renal stone - ARF on stage 3 CKD - acute metabolic encephalopathy--improving - from home with likely plan to return home at the time of d/c   Labs reviewed; BUN: 26 mg/dl, creatinine: 2.36 mg/dl, GFR: 27 ml/min.  Medications reviewed; 1 tablet  senokot BID.  IVF; LR @ 100 ml/hr.     NUTRITION - FOCUSED PHYSICAL EXAM:  Unable to complete at this time.   Diet Order:   Diet Order             Diet Heart Room service appropriate? Yes; Fluid consistency: Thin  Diet effective now                   EDUCATION NEEDS:   No education needs have been identified at this time  Skin:  Skin Assessment: Reviewed RN Assessment  Last BM:  7/19 (type 4 x1)  Height:   Ht Readings from Last 1 Encounters:  07/20/20 6' (1.829 m)    Weight:   Wt Readings from Last 1 Encounters:  07/20/20 84.8 kg      Estimated Nutritional Needs:  Kcal:  2000-2200 kcal Protein:  100-115 grams Fluid:  >/= 2.2 L/day       Jarome Matin, MS, RD, LDN, CNSC Inpatient Clinical Dietitian RD pager # available in [facility]  After hours/weekend pager # available in [facility]

## 2020-07-21 NOTE — TOC Progression Note (Signed)
Transition of Care [facility]) - Progression Note    Patient Details  Name: [name] MRN: [id number] Date of Birth: 1940/06/13  [facility] CM/SW Contact  Purcell Mouton, RN Phone Number: 07/21/2020, 10:50 AM  Clinical Narrative:    Pt is from home with spouse and adult children. TOC will continue to follow.    Expected Discharge Plan: [place] Barriers to Discharge: No Barriers Identified  Expected Discharge Plan and Services Expected Discharge Plan: [place] arrangements for the past 2 months: Single Family Home                                       Social Determinants of Health (SDOH) Interventions    Readmission Risk Interventions Readmission Risk Prevention Plan 10/31/2019  Transportation Screening Complete  Some recent data might be hidden

## 2020-07-21 NOTE — Progress Notes (Signed)
PROGRESS NOTE    [name]  XKP:[id number] DOB: [birth year] DOA: 07/20/2020 PCP: Milford Cage, PA    Brief Narrative:  80 year old gentleman with history of hypertension, left eye glaucoma with blindness, history of prostate cancer status post radiation treatment 2014, CKD stage IIIb with baseline creatinine about 1.6-1.8 and multiple complicated UTIs presented to the emergency room with abdominal pain and confusion.  Patient has been experiencing progressively worsening lower abdominal pain for last several weeks.  He went to see his urologist in the afternoon, urinalysis was collected and patient was sent home on Augmentin.  Patient went home took 1 dose of Augmentin, felt very weak and was noted to be confused so EMS was called and patient was brought to the ER. In the emergency room lactic acid 2, acute kidney injury, CT scan with evidence of 8 mm right UPJ stone that is obstructing with evidence of hydronephrosis and perinephric edema.  Started on IV antibiotics and IV fluids, taken to operating room with cystoscopy and left-sided ureteric stent placement.   Assessment & Plan:   Principal Problem:   Sepsis secondary to UTI [facility]) Active Problems:   Essential hypertension   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HCC)   History of prostate cancer   Lactic acidosis   Right ureteral stone   Hydronephrosis of right kidney   Acute metabolic encephalopathy  Sepsis secondary to complicated UTI/infected obstructive renal stone: Clinically stabilizing.  Resuscitated with IV fluids and currently remains on maintenance IV fluid. Status post cystoscopy and left-sided ureteric stent placement. Previous history of ESBL E. coli, currently on meropenem that we will continue until final blood cultures and urine cultures. Stent management and stone management as per urology.  Acute renal failure superimposed on chronic kidney disease stage IIIb: Secondary to  volume depletion and obstruction.  Hydrated with intravenous fluids.  Already improving.  Has a Foley catheter postop. Continue Foley catheter today.  Continue maintenance IV fluid.  Will try voiding trial tomorrow if adequate improvement in renal functions.  Essential hypertension: Blood pressures fairly normal.  Antihypertensives on hold.  Acute metabolic encephalopathy: Probably due to #1.  Mental status has much improved today.  No focal deficits.  Start mobilizing.  PT OT.  DVT prophylaxis: enoxaparin (LOVENOX) injection 30 mg Start: 07/21/20 1200   Code Status: Full code. Family Communication: None Disposition Plan: Status is: Inpatient  Remains inpatient appropriate because:Inpatient level of care appropriate due to severity of illness  Dispo: The patient is from: Home              Anticipated d/c is to: Home              Patient currently is not medically stable to d/c.   Difficult to place patient No         Consultants:  Urology  Procedures:  7/18, left stent  Antimicrobials:  Meropenem 7/18----   Subjective: Patient seen and examined.  Poor historian.  No other overnight events.  Denies any abdominal pain nausea vomiting.  Objective: Vitals:   07/20/20 2340 07/20/20 2350 07/21/20 0000 07/21/20 0426  BP:  125/85 132/84 (!) 152/77  Pulse: 78 79 78 66  Resp: 17 19 20 20   Temp:   98 F (36.7 C) 98.1 F (36.7 C)  TempSrc:   Oral Oral  SpO2: 98% 97% 99% 100%  Weight:      Height:        Intake/Output Summary (Last 24 hours)  at 07/21/2020 1347 Last data filed at 07/21/2020 0430 Gross per 24 hour  Intake 1875 ml  Output 750 ml  Net 1125 ml   Filed Weights   07/20/20 1850  Weight: 84.8 kg    Examination:  General exam: Appears calm and comfortable  Fairly comfortable at rest. Respiratory system: Clear to auscultation. Respiratory effort normal. Cardiovascular system: S1 & S2 heard, RRR. No JVD, murmurs, rubs, gallops or clicks. No pedal  edema. Gastrointestinal system: Soft and nontender.  Bowel sounds present. Foley catheter with clear urine.. Central nervous system: Alert and oriented. No focal neurological deficits.     Data Reviewed: I have personally reviewed following labs and imaging studies  CBC: Recent Labs  Lab 07/20/20 1736 07/21/20 0347  WBC 7.9 8.3  NEUTROABS 7.6 7.8*  HGB 10.1* 8.9*  HCT 32.2* 28.6*  MCV 104.5* 106.3*  PLT 95* 94*   Basic Metabolic Panel: Recent Labs  Lab 07/20/20 1736 07/21/20 0347  NA 135 139  K 4.1 4.9  CL 103 108  CO2 24 25  GLUCOSE 148* 159*  BUN 27* 26*  CREATININE 2.61* 2.36*  CALCIUM 9.0 8.8*  MG  --  2.4   GFR: Estimated Creatinine Clearance: 27.9 mL/min (A) (by C-G formula based on SCr of 2.36 mg/dL (H)). Liver Function Tests: Recent Labs  Lab 07/20/20 1736 07/21/20 0347  AST 21 24  ALT 15 16  ALKPHOS 55 48  BILITOT 0.7 0.6  PROT 7.6 6.9  ALBUMIN 3.5 3.0*   No results for input(s): LIPASE, AMYLASE in the last 168 hours. No results for input(s): AMMONIA in the last 168 hours. Coagulation Profile: Recent Labs  Lab 07/20/20 1736 07/21/20 0347  INR 1.5* 1.2   Cardiac Enzymes: No results for input(s): CKTOTAL, CKMB, CKMBINDEX, TROPONINI in the last 168 hours. BNP (last 3 results) No results for input(s): PROBNP in the last 8760 hours. HbA1C: No results for input(s): HGBA1C in the last 72 hours. CBG: No results for input(s): GLUCAP in the last 168 hours. Lipid Profile: No results for input(s): CHOL, HDL, LDLCALC, TRIG, CHOLHDL, LDLDIRECT in the last 72 hours. Thyroid Function Tests: No results for input(s): TSH, T4TOTAL, FREET4, T3FREE, THYROIDAB in the last 72 hours. Anemia Panel: No results for input(s): VITAMINB12, FOLATE, FERRITIN, TIBC, IRON, RETICCTPCT in the last 72 hours. Sepsis Labs: Recent Labs  Lab 07/20/20 1736 07/20/20 1936  LATICACIDVEN 2.0* 1.3    Recent Results (from the past 240 hour(s))  Resp Panel by RT-PCR (Flu  A&B, Covid) Nasopharyngeal Swab     Status: None   Collection Time: 07/20/20  5:36 PM   Specimen: Nasopharyngeal Swab; Nasopharyngeal(NP) swabs in vial transport medium  Result Value Ref Range Status   SARS Coronavirus 2 by RT PCR NEGATIVE NEGATIVE Final    Comment: (NOTE) SARS-CoV-2 target nucleic acids are NOT DETECTED.  The SARS-CoV-2 RNA is generally detectable in upper respiratory specimens during the acute phase of infection. The lowest concentration of SARS-CoV-2 viral copies this assay can detect is 138 copies/mL. A negative result does not preclude SARS-Cov-2 infection and should not be used as the sole basis for treatment or other patient management decisions. A negative result may occur with  improper specimen collection/handling, submission of specimen other than nasopharyngeal swab, presence of viral mutation(s) within the areas targeted by this assay, and inadequate number of viral copies(<138 copies/mL). A negative result must be combined with clinical observations, patient history, and epidemiological information. The expected result is Negative.  Fact Sheet for  Patients:  EntrepreneurPulse.com.au  Fact Sheet for Healthcare Providers:  IncredibleEmployment.be  This test is no t yet approved or cleared by the [place] FDA and  has been authorized for detection and/or diagnosis of SARS-CoV-2 by FDA under an Emergency Use Authorization (EUA). This EUA will remain  in effect (meaning this test can be used) for the duration of the COVID-19 declaration under Section 564(b)(1) of the Act, 21 U.S.C.section 360bbb-3(b)(1), unless the authorization is terminated  or revoked sooner.       Influenza A by PCR NEGATIVE NEGATIVE Final   Influenza B by PCR NEGATIVE NEGATIVE Final    Comment: (NOTE) The Xpert Xpress SARS-CoV-2/FLU/RSV plus assay is intended as an aid in the diagnosis of influenza from Nasopharyngeal swab specimens  and should not be used as a sole basis for treatment. Nasal washings and aspirates are unacceptable for Xpert Xpress SARS-CoV-2/FLU/RSV testing.  Fact Sheet for Patients: EntrepreneurPulse.com.au  Fact Sheet for Healthcare Providers: IncredibleEmployment.be  This test is not yet approved or cleared by the [place] FDA and has been authorized for detection and/or diagnosis of SARS-CoV-2 by FDA under an Emergency Use Authorization (EUA). This EUA will remain in effect (meaning this test can be used) for the duration of the COVID-19 declaration under Section 564(b)(1) of the Act, 21 U.S.C. section 360bbb-3(b)(1), unless the authorization is terminated or revoked.  Performed at [facility], [place] [address] [place], [place] [address]          Radiology Studies: [facility] Chest Port 1 View  Result Date: 07/20/2020 CLINICAL DATA:  Questionable sepsis - evaluate for abnormality Altered mental status.  Urinary tract infection. EXAM: PORTABLE CHEST 1 VIEW COMPARISON:  10/26/2019 FINDINGS: Low lung volumes. Normal heart size and mediastinal contours. Minor left lung base atelectasis without confluent airspace disease. No pleural effusion or pneumothorax. No pulmonary edema. No acute osseous abnormalities are seen IMPRESSION: Low lung volumes with minor left lung base atelectasis. Electronically Signed   By: Keith Rake M.D.   On: 07/20/2020 18:01   DG C-Arm 1-60 Min-No Report  Result Date: 07/20/2020 Fluoroscopy was utilized by the requesting physician.  No radiographic interpretation.   CT Renal Stone Study  Result Date: 07/20/2020 CLINICAL DATA:  Sepsis ETI. History of kidney stones. Mild lower abdominal pain EXAM: CT ABDOMEN AND PELVIS WITHOUT CONTRAST TECHNIQUE: Multidetector CT imaging of the abdomen and pelvis was performed following the standard protocol without IV contrast. COMPARISON:  CT 10/25/2019 FINDINGS: Lower chest:  Minor basilar atelectasis without confluent airspace disease or pleural effusion normal heart size. Hepatobiliary: Multiple scattered hepatic cysts are similar to prior exam. Partially distended gallbladder without gallstone or pericholecystic fat stranding. There is no biliary dilatation. Pancreas: No ductal dilatation or inflammation. Spleen: Normal in size without focal abnormality. Adrenals/Urinary Tract: No adrenal nodule. There is thinning of the right renal parenchyma. Moderate right hydronephrosis. 12 mm stone in the right renal pelvis appears nonobstructing, however there is an 8 mm stone in the ureteropelvic junction that may be obstructing. There is mild right perinephric edema. Mild prominence of the right ureter but no additional ureteral stones. Calcification in the right pelvis represents a phlebolith and is inferolateral to the ureteral insertion. There are no left renal calculi. Slight scarring in the left kidney. No left hydronephrosis. No left perinephric edema. Minimally distended urinary bladder with mild diffuse bladder wall thickening. Stomach/Bowel: Stomach distended with ingested contents/fluid. There is no small bowel obstruction or inflammation. Short appendix tentatively visualized. No appendicitis. Moderate  colonic stool burden. There is colonic tortuosity. Diverticulosis of the distal descending and sigmoid colon without diverticulitis. Vascular/Lymphatic: Mild to moderate aortic atherosclerosis. No aortic aneurysm. 10 mm right retroperitoneal lymph node at the level of the lower right kidney, new from prior exam and likely reactive. There are additional scattered retroperitoneal nodes are not enlarged by size criteria. No pelvic adenopathy. Reproductive: Surgical clip or brachytherapy seeds in the prostate. Prostate gland is poorly defined. Other: No free air or ascites. Moderate-sized fat containing supra umbilical hernia contains only fat. There is a fat containing right inguinal  hernia that contains small amount of free fluid Musculoskeletal: There are no acute or suspicious osseous abnormalities. Hemi transitional lumbosacral anatomy. Multilevel lumbar spondylosis. IMPRESSION: 1. Moderate right hydronephrosis with perinephric edema. An 8 mm stone in the right ureteropelvic junction may be obstructing. Mild right ureteral prominence is similar to prior exam, without distal ureteric stone. 2. Additional nonobstructing 12 mm stone in the right renal pelvis. 3. Mild diffuse bladder wall thickening, can be seen with urinary tract infection. 4. Colonic diverticulosis without diverticulitis. 5. Fat containing supra umbilical and right inguinal hernias. Aortic Atherosclerosis (ICD10-I70.0). Electronically Signed   By: Keith Rake M.D.   On: 07/20/2020 18:19        Scheduled Meds:  atenolol  50 mg Oral Daily   brimonidine  1 drop Left Eye Q8H   Chlorhexidine Gluconate Cloth  6 each Topical Daily   enoxaparin (LOVENOX) injection  30 mg Subcutaneous Q24H   latanoprost  1 drop Both Eyes QHS   senna-docusate  1 tablet Oral BID   timolol  1 drop Both Eyes BID   Continuous Infusions:  lactated ringers 100 mL/hr at 07/21/20 1236   meropenem (MERREM) IV       LOS: 1 day    Time spent: 32 minutes    Barb Merino, MD [facility] Pager [phone]

## 2020-07-21 NOTE — Plan of Care (Signed)
  Problem: Nutrition: Goal: Adequate nutrition will be maintained Outcome: Progressing   Problem: Safety: Goal: Ability to remain free from injury will improve Outcome: Progressing   Problem: Skin Integrity: Goal: Risk for impaired skin integrity will decrease Outcome: Progressing   Problem: Pain Managment: Goal: General experience of comfort will improve Outcome: Not Progressing

## 2020-07-21 NOTE — Progress Notes (Signed)
Pharmacy Antibiotic Note  [name] is a 80 y.o. male admitted on 07/20/2020 with sepsis with UTI, and nephrolithiasis.  PMH significant for prostate cancer, CKD, nephrolithiasis, UTI (previous ESBL E Coli UTI). S/P cystoscopy with ureteral stent placement 7/18 pm.  Pharmacy has been consulted for Meropenem dosing.   Today, 07/21/20 WBC WNL SCr 2.36, CrCl 28 mL/min. Improved slightly Afebrile  Plan: Increase meropenem to 1 g IV q12h based on improvement in renal function Follow renal function and adjust meropenem dose as needed F/U culture results and sensitivities  Height: 6' (182.9 cm) Weight: 84.8 kg (187 lb) IBW/kg (Calculated) : 77.6  Temp (24hrs), Avg:98.6 F (37 C), Min:97.6 F (36.4 C), Max:100.3 F (37.9 C)  Recent Labs  Lab 07/20/20 1736 07/20/20 1936 07/21/20 0347  WBC 7.9  --  8.3  CREATININE 2.61*  --  2.36*  LATICACIDVEN 2.0* 1.3  --      Estimated Creatinine Clearance: 27.9 mL/min (A) (by C-G formula based on SCr of 2.36 mg/dL (H)).    Allergies  Allergen Reactions   Tape Other (See Comments)    Redness and blisters/paper tape-not latex   Doxycycline Hyclate Other (See Comments)    Mental changes    Sulfa Antibiotics Hives    Antimicrobials this admission: 7/18 Meropenem >>    Dose adjustments this admission: 7/19: Meropenem 500 mg q12h >> 1 g q12h  Microbiology results: 7/18 BCx:   7/18 UCx:     Thank you for allowing pharmacy to be a part of this patient's care.  Lenis Noon, PharmD 07/21/2020 10:10 AM

## 2020-07-21 NOTE — Progress Notes (Signed)
Urology Consult Note   Requesting Attending Physician:  Barb Merino, MD Service Providing Consult: Urology   Reason for Consult:  Nephrolithiasis  Assessment: 80 y.o. male with PMH Prostate cancer s/p IMRT and with nephrolihtaisis who presented with AMS, lower abdominal pain, UTI, and CT that demonstrates 20mm obstructing stone with  associated hydronephrosis.  Concerning for infection given: UA at urology office with leukocytes, LE, bacteria. Additionally, low grade fever and tachycardic in ER.   Now s/p Right ureteral stent placement on 07/20/20 with Urology.   Recommendations: Agree with broad antibiotics which will be tailored per his urine culture data.  Recommend treating for complicated UTI course (14 days)  -We will arrange for outpatient definitive stone management. An outpatient appointment has been requested with Dr. Beatrix Fetters Foley catheter per primary team.  Can likely try to void him once he has been afebrile and his labs are appropriately downtrending  Discussed with Dr. Beatrix Fetters  Thank you for this consult. Please contact the urology consult pager with any further questions/concerns.   Interval 07/21/20: Now status post right ureteral stent placement.  Patient is afebrile and hemodynamically stable.  Foley catheter draining clear yellow urine.  Endorsing bladder spasms.  Past Medical History: Past Medical History:  Diagnosis Date   Anxiety    Arthritis    Bilateral flank pain    DUE TO KIDNEY STONES   Chronic back pain    Dyslipidemia    Eczema    Elevated PSA 01/11/2012   25.77   Frequency of urination    Glaucoma PT STATES RX RAN OUT   Glaucoma    Hematuria    Herniated disc    History of kidney stones    History of radiation therapy 06/07/12-08/02/12   prostate, 7800 cGy/40 sessions/ 5600cGy pelvic lymph nodes/40 sessions   History of shingles    History of TIA (transient ischemic attack) 2010--  NO RESIDUAL   NONE SINCE   Hypertension    Nocturia     Prostate cancer ([place]) 03/2012   Renal calculi BILATERAL    Urgency of urination    Weakness of both legs     Past Surgical History:  Past Surgical History:  Procedure Laterality Date   BILIARY STENT PLACEMENT  06/15/2011   Procedure: BILIARY STENT PLACEMENT;  Surgeon: Franchot Gallo, MD;  Location: [facility];  Service: Urology;  Laterality: Bilateral;   CYSTO/ BILATERAL RETROGRADE PYELOGRAM/ LEFT URETERSCOPIC STONE EXTRACTION  11-14-2005   LEFT URETERAL CALCULUS   CYSTO/ BILATERAL URETERAL STENTS  11-06-2005   CYSTO/ LEFT RETROGRADE PYELOGRAM/  LEFT STENT PLACMENT  05-21-2011   CYSTOSCOPY W/ URETERAL STENT REMOVAL  06/15/2011   Procedure: CYSTOSCOPY WITH STENT REMOVAL;  Surgeon: Franchot Gallo, MD;  Location: [facility];  Service: Urology;  Laterality: Left;   EXTRACORPOREAL SHOCK WAVE LITHOTRIPSY     INTERNAL URETHROTOMY/ TURP  12-23-2002   BPH W/ STRICTURE   PERCUTANEOUS NEPHROLITHOTOMY  12-10-2007  &  11-07-2005   RIGHT RENAL CALCULI   REPAIR LEFT INGUINAL HERNIA W/ MESH  12-23-2002   RIGHT URETERAL STENT PLACEMENT  02-10-2003   RIGHT URETEROSCOPIC STONE EXTRACTION  05-29-2003   TRANSTHORACIC ECHOCARDIOGRAM  12-19-2008   LVSF NORMAL/ EF 21-30%/ GRADE I DIASTOLIC DYSFUNCTION/    TRANSURETHRAL RESECTION OF BLADDER  2004   URETEROSCOPY  06/15/2011   Procedure: URETEROSCOPY;  Surgeon: Franchot Gallo, MD;  Location: [facility];  Service: Urology;  Laterality: Bilateral;    Medication: Current Facility-Administered Medications  Medication  Dose Route Frequency Provider Last Rate Last Admin   acetaminophen (TYLENOL) tablet 650 mg  650 mg Oral Q6H PRN Vernelle Emerald, MD   650 mg at 07/21/20 0132   Or   acetaminophen (TYLENOL) suppository 650 mg  650 mg Rectal Q6H PRN Shalhoub, Sherryll Burger, MD       ALPRAZolam Duanne Moron) tablet 0.5 mg  0.5 mg Oral BID PRN Vernelle Emerald, MD   0.5 mg at 07/21/20 0132   atenolol (TENORMIN) tablet  50 mg  50 mg Oral Daily Shalhoub, Sherryll Burger, MD   50 mg at 07/21/20 1038   brimonidine (ALPHAGAN) 0.15 % ophthalmic solution 1 drop  1 drop Left Eye Q8H Shalhoub, Sherryll Burger, MD   1 drop at 07/21/20 5885   Chlorhexidine Gluconate Cloth 2 % PADS 6 each  6 each Topical Daily Barb Merino, MD       enoxaparin (LOVENOX) injection 30 mg  30 mg Subcutaneous Q24H Shalhoub, Sherryll Burger, MD       lactated ringers infusion   Intravenous Continuous Shalhoub, Sherryll Burger, MD 100 mL/hr at 07/21/20 0301 New Bag at 07/21/20 0301   latanoprost (XALATAN) 0.005 % ophthalmic solution 1 drop  1 drop Both Eyes QHS Shalhoub, Sherryll Burger, MD       meropenem (MERREM) 1 g in sodium chloride 0.9 % 100 mL IVPB  1 g Intravenous Q12H Lenis Noon, RPH       ondansetron [facility]) tablet 4 mg  4 mg Oral Q6H PRN Shalhoub, Sherryll Burger, MD       Or   ondansetron [facility]) injection 4 mg  4 mg Intravenous Q6H PRN Shalhoub, Sherryll Burger, MD       polyethylene glycol (MIRALAX / GLYCOLAX) packet 17 g  17 g Oral Daily PRN Shalhoub, Sherryll Burger, MD       timolol (TIMOPTIC) 0.5 % ophthalmic solution 1 drop  1 drop Both Eyes BID Shalhoub, Sherryll Burger, MD        Allergies: Allergies  Allergen Reactions   Tape Other (See Comments)    Redness and blisters/paper tape-not latex   Doxycycline Hyclate Other (See Comments)    Mental changes    Sulfa Antibiotics Hives    Social History: Social History   Tobacco Use   Smoking status: Former    Years: 40.00    Types: Cigarettes    Quit date: 06/10/1990    Years since quitting: 30.1   Smokeless tobacco: Never  Substance Use Topics   Alcohol use: No   Drug use: No    Family History Family History  Problem Relation Age of Onset   Hypertension Mother    Stroke Father    Hypertension Sister    Hypertension Brother     Review of Systems 10 systems were reviewed and are negative except as noted specifically in the HPI.  Objective   Vital signs in last 24 hours: BP (!) 152/77 (BP Location: Right  Arm)   Pulse 66   Temp 98.1 F (36.7 C) (Oral)   Resp 20   Ht 6' (1.829 m)   Wt 84.8 kg   SpO2 100%   BMI 25.36 kg/m   Physical Exam General: NAD, A&O, resting, appropriate HEENT: [place]/AT, EOMI, MMM Pulmonary: Normal work of breathing Cardiovascular: HDS, adequate peripheral perfusion Abdomen: Soft, NTTP, nondistended, mildusprapubic tenderness. GU: purewick on to obtain urine specimen, No significant CVA tenderness Extremities: warm and well perfused Neuro: Appropriate, no focal neurological deficits  Most Recent Labs:  Lab Results  Component Value Date   WBC 8.3 07/21/2020   HGB 8.9 (L) 07/21/2020   HCT 28.6 (L) 07/21/2020   PLT 94 (L) 07/21/2020    Lab Results  Component Value Date   NA 139 07/21/2020   K 4.9 07/21/2020   CL 108 07/21/2020   CO2 25 07/21/2020   BUN 26 (H) 07/21/2020   CREATININE 2.36 (H) 07/21/2020   CALCIUM 8.8 (L) 07/21/2020   MG 2.4 07/21/2020   PHOS 3.4 10/31/2019    Lab Results  Component Value Date   INR 1.2 07/21/2020   APTT 37 (H) 07/20/2020     Urine Culture: @[id number] (laburin,org,[id number],[id number])@   IMAGING: DG Chest Port 1 View  Result Date: 07/20/2020 CLINICAL DATA:  Questionable sepsis - evaluate for abnormality Altered mental status.  Urinary tract infection. EXAM: PORTABLE CHEST 1 VIEW COMPARISON:  10/26/2019 FINDINGS: Low lung volumes. Normal heart size and mediastinal contours. Minor left lung base atelectasis without confluent airspace disease. No pleural effusion or pneumothorax. No pulmonary edema. No acute osseous abnormalities are seen IMPRESSION: Low lung volumes with minor left lung base atelectasis. Electronically Signed   By: Keith Rake M.D.   On: 07/20/2020 18:01   DG C-Arm 1-60 Min-No Report  Result Date: 07/20/2020 Fluoroscopy was utilized by the requesting physician.  No radiographic interpretation.   CT Renal Stone Study  Result Date: 07/20/2020 CLINICAL DATA:  Sepsis ETI. History of kidney stones.  Mild lower abdominal pain EXAM: CT ABDOMEN AND PELVIS WITHOUT CONTRAST TECHNIQUE: Multidetector CT imaging of the abdomen and pelvis was performed following the standard protocol without IV contrast. COMPARISON:  CT 10/25/2019 FINDINGS: Lower chest: Minor basilar atelectasis without confluent airspace disease or pleural effusion normal heart size. Hepatobiliary: Multiple scattered hepatic cysts are similar to prior exam. Partially distended gallbladder without gallstone or pericholecystic fat stranding. There is no biliary dilatation. Pancreas: No ductal dilatation or inflammation. Spleen: Normal in size without focal abnormality. Adrenals/Urinary Tract: No adrenal nodule. There is thinning of the right renal parenchyma. Moderate right hydronephrosis. 12 mm stone in the right renal pelvis appears nonobstructing, however there is an 8 mm stone in the ureteropelvic junction that may be obstructing. There is mild right perinephric edema. Mild prominence of the right ureter but no additional ureteral stones. Calcification in the right pelvis represents a phlebolith and is inferolateral to the ureteral insertion. There are no left renal calculi. Slight scarring in the left kidney. No left hydronephrosis. No left perinephric edema. Minimally distended urinary bladder with mild diffuse bladder wall thickening. Stomach/Bowel: Stomach distended with ingested contents/fluid. There is no small bowel obstruction or inflammation. Short appendix tentatively visualized. No appendicitis. Moderate colonic stool burden. There is colonic tortuosity. Diverticulosis of the distal descending and sigmoid colon without diverticulitis. Vascular/Lymphatic: Mild to moderate aortic atherosclerosis. No aortic aneurysm. 10 mm right retroperitoneal lymph node at the level of the lower right kidney, new from prior exam and likely reactive. There are additional scattered retroperitoneal nodes are not enlarged by size criteria. No pelvic adenopathy.  Reproductive: Surgical clip or brachytherapy seeds in the prostate. Prostate gland is poorly defined. Other: No free air or ascites. Moderate-sized fat containing supra umbilical hernia contains only fat. There is a fat containing right inguinal hernia that contains small amount of free fluid Musculoskeletal: There are no acute or suspicious osseous abnormalities. Hemi transitional lumbosacral anatomy. Multilevel lumbar spondylosis. IMPRESSION: 1. Moderate right hydronephrosis with perinephric edema. An 8 mm stone in the right ureteropelvic junction may be obstructing.  Mild right ureteral prominence is similar to prior exam, without distal ureteric stone. 2. Additional nonobstructing 12 mm stone in the right renal pelvis. 3. Mild diffuse bladder wall thickening, can be seen with urinary tract infection. 4. Colonic diverticulosis without diverticulitis. 5. Fat containing supra umbilical and right inguinal hernias. Aortic Atherosclerosis (ICD10-I70.0). Electronically Signed   By: Keith Rake M.D.   On: 07/20/2020 18:19    ------

## 2020-07-21 NOTE — Consult Note (Deleted)
Urology Consult Note   Requesting Attending Physician:  Barb Merino, MD Service Providing Consult: Urology   Reason for Consult:  Nephrolithiasis  Assessment: 80 y.o. male with PMH Prostate cancer s/p IMRT and with nephrolihtaisis who presented with AMS, lower abdominal pain, UTI, and CT that demonstrates 57mm obstructing stone with  associated hydronephrosis.  Concerning for infection given: UA at urology office with leukocytes, LE, bacteria. Additionally, low grade fever and tachycardic in ER.   Now s/p Right ureteral stent placement on 07/20/20 with Urology.   Recommendations: Agree with broad antibiotics which will be tailored per his urine culture data.  Recommend treating for complicated UTI course (14 days)  -We will arrange for outpatient definitive stone management. An outpatient appointment has been requested with Dr. Beatrix Fetters Foley catheter per primary team.  Can likely try to void him once he has been afebrile and his labs are appropriately downtrending  Discussed with Drs. Winter and D[place]stead  Thank you for this consult. Please contact the urology consult pager with any further questions/concerns.   Interval 07/21/20: Now status post right ureteral stent placement.  Patient is afebrile and hemodynamically stable.  Foley catheter draining clear yellow urine.  Endorsing bladder spasms.  Past Medical History: Past Medical History:  Diagnosis Date   Anxiety    Arthritis    Bilateral flank pain    DUE TO KIDNEY STONES   Chronic back pain    Dyslipidemia    Eczema    Elevated PSA 01/11/2012   25.77   Frequency of urination    Glaucoma PT STATES RX RAN OUT   Glaucoma    Hematuria    Herniated disc    History of kidney stones    History of radiation therapy 06/07/12-08/02/12   prostate, 7800 cGy/40 sessions/ 5600cGy pelvic lymph nodes/40 sessions   History of shingles    History of TIA (transient ischemic attack) 2010--  NO RESIDUAL   NONE SINCE   Hypertension     Nocturia    Prostate cancer ([place]) 03/2012   Renal calculi BILATERAL    Urgency of urination    Weakness of both legs     Past Surgical History:  Past Surgical History:  Procedure Laterality Date   BILIARY STENT PLACEMENT  06/15/2011   Procedure: BILIARY STENT PLACEMENT;  Surgeon: Franchot Gallo, MD;  Location[facility];  Service: Urology;  Laterality: Bilateral;   CYSTO/ BILATERAL RETROGRADE PYELOGRAM/ LEFT URETERSCOPIC STONE EXTRACTION  11-14-2005   LEFT URETERAL CALCULUS   CYSTO/ BILATERAL URETERAL STENTS  11-06-2005   CYSTO/ LEFT RETROGRADE PYELOGRAM/  LEFT STENT PLACMENT  05-21-2011   CYSTOSCOPY W/ URETERAL STENT REMOVAL  06/15/2011   Procedure: CYSTOSCOPY WITH STENT REMOVAL;  Surgeon: Franchot Gallo, MD;  Location: [facility];  Service: Urology;  Laterality: Left;   EXTRACORPOREAL SHOCK WAVE LITHOTRIPSY     INTERNAL URETHROTOMY/ TURP  12-23-2002   BPH W/ STRICTURE   PERCUTANEOUS NEPHROLITHOTOMY  12-10-2007  &  11-07-2005   RIGHT RENAL CALCULI   REPAIR LEFT INGUINAL HERNIA W/ MESH  12-23-2002   RIGHT URETERAL STENT PLACEMENT  02-10-2003   RIGHT URETEROSCOPIC STONE EXTRACTION  05-29-2003   TRANSTHORACIC ECHOCARDIOGRAM  12-19-2008   LVSF NORMAL/ EF 35-70%/ GRADE I DIASTOLIC DYSFUNCTION/    TRANSURETHRAL RESECTION OF BLADDER  2004   URETEROSCOPY  06/15/2011   Procedure: URETEROSCOPY;  Surgeon: Franchot Gallo, MD;  Location: [facility];  Service: Urology;  Laterality: Bilateral;    Medication: Current Facility-Administered Medications  Medication Dose Route Frequency Provider Last Rate Last Admin   acetaminophen (TYLENOL) tablet 650 mg  650 mg Oral Q6H PRN Vernelle Emerald, MD   650 mg at 07/21/20 0132   Or   acetaminophen (TYLENOL) suppository 650 mg  650 mg Rectal Q6H PRN Shalhoub, Sherryll Burger, MD       ALPRAZolam Duanne Moron) tablet 0.5 mg  0.5 mg Oral BID PRN Vernelle Emerald, MD   0.5 mg at 07/21/20 0132   atenolol  (TENORMIN) tablet 50 mg  50 mg Oral Daily Shalhoub, Sherryll Burger, MD       brimonidine (ALPHAGAN) 0.15 % ophthalmic solution 1 drop  1 drop Left Eye Q8H Shalhoub, Sherryll Burger, MD   1 drop at 07/21/20 0651   enoxaparin (LOVENOX) injection 30 mg  30 mg Subcutaneous Q24H Shalhoub, Sherryll Burger, MD       lactated ringers infusion   Intravenous Continuous Shalhoub, Sherryll Burger, MD 100 mL/hr at 07/21/20 0301 New Bag at 07/21/20 0301   latanoprost (XALATAN) 0.005 % ophthalmic solution 1 drop  1 drop Both Eyes QHS Shalhoub, Sherryll Burger, MD       meropenem (MERREM) 500 mg in sodium chloride 0.9 % 100 mL IVPB  500 mg Intravenous Q12H Poindexter, Leann T, RPH       ondansetron (ZOFRAN) tablet 4 mg  4 mg Oral Q6H PRN Shalhoub, Sherryll Burger, MD       Or   ondansetron (ZOFRAN) injection 4 mg  4 mg Intravenous Q6H PRN Shalhoub, Sherryll Burger, MD       polyethylene glycol (MIRALAX / GLYCOLAX) packet 17 g  17 g Oral Daily PRN Shalhoub, Sherryll Burger, MD       timolol (TIMOPTIC) 0.5 % ophthalmic solution 1 drop  1 drop Both Eyes BID Shalhoub, Sherryll Burger, MD        Allergies: Allergies  Allergen Reactions   Tape Other (See Comments)    Redness and blisters/paper tape-not latex   Doxycycline Hyclate Other (See Comments)    Mental changes    Sulfa Antibiotics Hives    Social History: Social History   Tobacco Use   Smoking status: Former    Years: 40.00    Types: Cigarettes    Quit date: 06/10/1990    Years since quitting: 30.1   Smokeless tobacco: Never  Substance Use Topics   Alcohol use: No   Drug use: No    Family History Family History  Problem Relation Age of Onset   Hypertension Mother    Stroke Father    Hypertension Sister    Hypertension Brother     Review of Systems 10 systems were reviewed and are negative except as noted specifically in the HPI.  Objective   Vital signs in last 24 hours: BP (!) 152/77 (BP Location: Right Arm)   Pulse 66   Temp 98.1 F (36.7 C) (Oral)   Resp 20   Ht 6' (1.829 m)   Wt  84.8 kg   SpO2 100%   BMI 25.36 kg/m   Physical Exam General: NAD, A&O, resting, appropriate HEENT: [place]/AT, EOMI, MMM Pulmonary: Normal work of breathing Cardiovascular: HDS, adequate peripheral perfusion Abdomen: Soft, NTTP, nondistended, mildusprapubic tenderness. GU: purewick on to obtain urine specimen, No significant CVA tenderness Extremities: warm and well perfused Neuro: Appropriate, no focal neurological deficits  Most Recent Labs: Lab Results  Component Value Date   WBC 8.3 07/21/2020   HGB 8.9 (L) 07/21/2020   HCT 28.6 (L) 07/21/2020  PLT 94 (L) 07/21/2020    Lab Results  Component Value Date   NA 139 07/21/2020   K 4.9 07/21/2020   CL 108 07/21/2020   CO2 25 07/21/2020   BUN 26 (H) 07/21/2020   CREATININE 2.36 (H) 07/21/2020   CALCIUM 8.8 (L) 07/21/2020   MG 2.4 07/21/2020   PHOS 3.4 10/31/2019    Lab Results  Component Value Date   INR 1.2 07/21/2020   APTT 37 (H) 07/20/2020     Urine Culture: @[id number] (laburin,org,[id number],[id number])@   IMAGING: DG Chest Port 1 View  Result Date: 07/20/2020 CLINICAL DATA:  Questionable sepsis - evaluate for abnormality Altered mental status.  Urinary tract infection. EXAM: PORTABLE CHEST 1 VIEW COMPARISON:  10/26/2019 FINDINGS: Low lung volumes. Normal heart size and mediastinal contours. Minor left lung base atelectasis without confluent airspace disease. No pleural effusion or pneumothorax. No pulmonary edema. No acute osseous abnormalities are seen IMPRESSION: Low lung volumes with minor left lung base atelectasis. Electronically Signed   By: Keith Rake M.D.   On: 07/20/2020 18:01   DG C-Arm 1-60 Min-No Report  Result Date: 07/20/2020 Fluoroscopy was utilized by the requesting physician.  No radiographic interpretation.   CT Renal Stone Study  Result Date: 07/20/2020 CLINICAL DATA:  Sepsis ETI. History of kidney stones. Mild lower abdominal pain EXAM: CT ABDOMEN AND PELVIS WITHOUT CONTRAST TECHNIQUE:  Multidetector CT imaging of the abdomen and pelvis was performed following the standard protocol without IV contrast. COMPARISON:  CT 10/25/2019 FINDINGS: Lower chest: Minor basilar atelectasis without confluent airspace disease or pleural effusion normal heart size. Hepatobiliary: Multiple scattered hepatic cysts are similar to prior exam. Partially distended gallbladder without gallstone or pericholecystic fat stranding. There is no biliary dilatation. Pancreas: No ductal dilatation or inflammation. Spleen: Normal in size without focal abnormality. Adrenals/Urinary Tract: No adrenal nodule. There is thinning of the right renal parenchyma. Moderate right hydronephrosis. 12 mm stone in the right renal pelvis appears nonobstructing, however there is an 8 mm stone in the ureteropelvic junction that may be obstructing. There is mild right perinephric edema. Mild prominence of the right ureter but no additional ureteral stones. Calcification in the right pelvis represents a phlebolith and is inferolateral to the ureteral insertion. There are no left renal calculi. Slight scarring in the left kidney. No left hydronephrosis. No left perinephric edema. Minimally distended urinary bladder with mild diffuse bladder wall thickening. Stomach/Bowel: Stomach distended with ingested contents/fluid. There is no small bowel obstruction or inflammation. Short appendix tentatively visualized. No appendicitis. Moderate colonic stool burden. There is colonic tortuosity. Diverticulosis of the distal descending and sigmoid colon without diverticulitis. Vascular/Lymphatic: Mild to moderate aortic atherosclerosis. No aortic aneurysm. 10 mm right retroperitoneal lymph node at the level of the lower right kidney, new from prior exam and likely reactive. There are additional scattered retroperitoneal nodes are not enlarged by size criteria. No pelvic adenopathy. Reproductive: Surgical clip or brachytherapy seeds in the prostate. Prostate  gland is poorly defined. Other: No free air or ascites. Moderate-sized fat containing supra umbilical hernia contains only fat. There is a fat containing right inguinal hernia that contains small amount of free fluid Musculoskeletal: There are no acute or suspicious osseous abnormalities. Hemi transitional lumbosacral anatomy. Multilevel lumbar spondylosis. IMPRESSION: 1. Moderate right hydronephrosis with perinephric edema. An 8 mm stone in the right ureteropelvic junction may be obstructing. Mild right ureteral prominence is similar to prior exam, without distal ureteric stone. 2. Additional nonobstructing 12 mm stone in the right renal pelvis. 3.  Mild diffuse bladder wall thickening, can be seen with urinary tract infection. 4. Colonic diverticulosis without diverticulitis. 5. Fat containing supra umbilical and right inguinal hernias. Aortic Atherosclerosis (ICD10-I70.0). Electronically Signed   By: Keith Rake M.D.   On: 07/20/2020 18:19    ------

## 2020-07-21 NOTE — Progress Notes (Signed)
Pt refusing lovenox injection at this time. Education provided. Patient continues to decline at this time. MD made aware.

## 2020-07-21 NOTE — Plan of Care (Signed)

## 2020-07-21 NOTE — Evaluation (Addendum)
Physical Therapy Evaluation Patient Details Name: [name] MRN: [id number] DOB: 25-Dec-1940 Today's Date: 07/21/2020   History of Present Illness  80 yo male admitted with UTI, sepsis. S/P R ureteral stent placement 7/18. Hx of CKD, chronic pain, prostate ca, TIA, chronic LE weakness, L eye blindness  Clinical Impression  Bed level only. Pt declined OOB with PT. He stated it was "not a good time." He mentioned issue with pain medication and something about "withdrawal." He did allow me to assess his UE/LE strength in bed. Will follow and progress activity as pt will allow. No family was present during session. Will update recommendations as able/if necessary.     Follow Up Recommendations Home health PT;Supervision/Assistance - 24 hour (continuing to assess-may have to update)    Equipment Recommendations  None recommended by PT    Recommendations for Other Services       Precautions / Restrictions Precautions Precautions: Fall Restrictions Weight Bearing Restrictions: No      Mobility  Bed Mobility               General bed mobility comments: NT-pt declined OOB-he doesn't think MD/RN are medicating him appropriately-mentioned something about withdrawal???    Transfers                    Ambulation/Gait                Stairs            Wheelchair Mobility    Modified Rankin (Stroke Patients Only)       Balance                                             Pertinent Vitals/Pain Pain Assessment: 0-10 Pain Score: 2  Pain Location: intemittent-when trying to urinate Pain Descriptors / Indicators: Discomfort Pain Intervention(s): Monitored during session    Home Living Family/patient expects to be discharged to:: Private residence Living Arrangements: Spouse/significant other;Children Available Help at Discharge: Family Type of Home: House Home Access: Level entry     Home Layout: Two level;Able to live on  main level with bedroom/bathroom Home Equipment: None      Prior Function Level of Independence: Independent               Hand Dominance        Extremity/Trunk Assessment   Upper Extremity Assessment Upper Extremity Assessment: Overall WFL for tasks assessed    Lower Extremity Assessment Lower Extremity Assessment: Generalized weakness    Cervical / Trunk Assessment Cervical / Trunk Assessment: Normal  Communication   Communication: No difficulties  Cognition Arousal/Alertness: Awake/alert Behavior During Therapy: WFL for tasks assessed/performed Overall Cognitive Status: Within Functional Limits for tasks assessed                                 General Comments: mostly WFL but some mild confusion possibly      General Comments      Exercises     Assessment/Plan    PT Assessment Patient needs continued PT services  PT Problem List Decreased strength;Decreased mobility;Decreased activity tolerance;Decreased balance;Decreased knowledge of use of DME;Pain       PT Treatment Interventions DME instruction;Gait training;Therapeutic exercise;Balance training;Functional mobility training;Therapeutic activities;Patient/family education    PT Goals (Current goals can  be found in the Care Plan section)  Acute Rehab PT Goals Patient Stated Goal: to be able to move freely/at will PT Goal Formulation: With patient Time For Goal Achievement: 08/04/20 Potential to Achieve Goals: Good    Frequency Min 3X/week   Barriers to discharge        Co-evaluation               AM-PAC PT "6 Clicks" Mobility  Outcome Measure Help needed turning from your back to your side while in a flat bed without using bedrails?: A Little Help needed moving from lying on your back to sitting on the side of a flat bed without using bedrails?: A Little Help needed moving to and from a bed to a chair (including a wheelchair)?: A Little Help needed standing up from a  chair using your arms (e.g., wheelchair or bedside chair)?: A Little Help needed to walk in hospital room?: A Little Help needed climbing 3-5 steps with a railing? : A Lot 6 Click Score: 17    End of Session   Activity Tolerance: Patient tolerated treatment well Patient left: in bed;with call bell/phone within reach;with bed alarm set   PT Visit Diagnosis: Muscle weakness (generalized) (M62.81);Difficulty in walking, not elsewhere classified (R26.2)    Time: 1142-1150 PT Time Calculation (min) (ACUTE ONLY): 8 min   Charges:   PT Evaluation $PT Eval Moderate Complexity: 1 Mod             Doreatha Massed, PT Acute Rehabilitation  Office: [phone] Pager: [phone]

## 2020-07-22 ENCOUNTER — Encounter (HOSPITAL_COMMUNITY): Payer: Self-pay | Admitting: Internal Medicine

## 2020-07-22 DIAGNOSIS — A419 Sepsis, unspecified organism: Secondary | ICD-10-CM | POA: Diagnosis not present

## 2020-07-22 DIAGNOSIS — N390 Urinary tract infection, site not specified: Secondary | ICD-10-CM | POA: Diagnosis not present

## 2020-07-22 LAB — CBC WITH DIFFERENTIAL/PLATELET
Abs Immature Granulocytes: 0.08 10*3/uL — ABNORMAL HIGH (ref 0.00–0.07)
Basophils Absolute: 0 10*3/uL (ref 0.0–0.1)
Basophils Relative: 0 %
Eosinophils Absolute: 0 10*3/uL (ref 0.0–0.5)
Eosinophils Relative: 0 %
HCT: 28.4 % — ABNORMAL LOW (ref 39.0–52.0)
Hemoglobin: 9 g/dL — ABNORMAL LOW (ref 13.0–17.0)
Immature Granulocytes: 1 %
Lymphocytes Relative: 5 %
Lymphs Abs: 0.4 10*3/uL — ABNORMAL LOW (ref 0.7–4.0)
MCH: 32.8 pg (ref 26.0–34.0)
MCHC: 31.7 g/dL (ref 30.0–36.0)
MCV: 103.6 fL — ABNORMAL HIGH (ref 80.0–100.0)
Monocytes Absolute: 0.5 10*3/uL (ref 0.1–1.0)
Monocytes Relative: 7 %
Neutro Abs: 6.3 10*3/uL (ref 1.7–7.7)
Neutrophils Relative %: 87 %
Platelets: 82 10*3/uL — ABNORMAL LOW (ref 150–400)
RBC: 2.74 MIL/uL — ABNORMAL LOW (ref 4.22–5.81)
RDW: 13.2 % (ref 11.5–15.5)
WBC: 7.3 10*3/uL (ref 4.0–10.5)
nRBC: 0 % (ref 0.0–0.2)

## 2020-07-22 LAB — URINE CULTURE: Culture: NO GROWTH

## 2020-07-22 LAB — BASIC METABOLIC PANEL
Anion gap: 10 (ref 5–15)
BUN: 35 mg/dL — ABNORMAL HIGH (ref 8–23)
CO2: 24 mmol/L (ref 22–32)
Calcium: 9.1 mg/dL (ref 8.9–10.3)
Chloride: 107 mmol/L (ref 98–111)
Creatinine, Ser: 2.09 mg/dL — ABNORMAL HIGH (ref 0.61–1.24)
GFR, Estimated: 32 mL/min — ABNORMAL LOW (ref >60)
Glucose, Bld: 158 mg/dL — ABNORMAL HIGH (ref 70–99)
Potassium: 4.6 mmol/L (ref 3.5–5.1)
Sodium: 141 mmol/L (ref 135–145)

## 2020-07-22 MED ORDER — ENOXAPARIN SODIUM 40 MG/0.4ML IJ SOSY
40.0000 mg | PREFILLED_SYRINGE | INTRAMUSCULAR | Status: DC
  Administered 2020-07-22: 40 mg via SUBCUTANEOUS
  Filled 2020-07-22: qty 0.4

## 2020-07-22 NOTE — Progress Notes (Signed)
2 Days Post-Op Subjective: Patient reports that he is feeling fine.  No fever.  Objective: Vital signs in last 24 hours: Temp:  [97.6 F (36.4 C)-97.8 F (36.6 C)] 97.6 F (36.4 C) (07/20 0500) Pulse Rate:  [58-80] 58 (07/20 0500) Resp:  [20] 20 (07/20 0500) BP: (119-138)/(79-89) 119/87 (07/20 0500) SpO2:  [100 %] 100 % (07/20 0500)  Intake/Output from previous day: 07/19 0701 - 07/20 0700 In: 2119.5 [P.O.:720; I.V.:1299.5; IV Piggyback:100] Out: 950 [Urine:950] Intake/Output this shift: No intake/output data recorded.  Physical Exam:  Constitutional: Vital signs reviewed. WD WN in NAD   Eyes: PERRL, No scleral icterus.   Cardiovascular: RRR Pulmonary/Chest: Normal effort Extremities: No cyanosis or edema   Lab Results: Recent Labs    07/20/20 1736 07/21/20 0347 07/22/20 0336  HGB 10.1* 8.9* 9.0*  HCT 32.2* 28.6* 28.4*   BMET Recent Labs    07/21/20 0347 07/22/20 0336  NA 139 141  K 4.9 4.6  CL 108 107  CO2 25 24  GLUCOSE 159* 158*  BUN 26* 35*  CREATININE 2.36* 2.09*  CALCIUM 8.8* 9.1   Recent Labs    07/20/20 1736 07/21/20 0347  INR 1.5* 1.2   No results for input(s): LABURIN in the last 72 hours. Results for orders placed or performed during the hospital encounter of 07/20/20  Resp Panel by RT-PCR (Flu A&B, Covid) Nasopharyngeal Swab     Status: None   Collection Time: 07/20/20  5:36 PM   Specimen: Nasopharyngeal Swab; Nasopharyngeal(NP) swabs in vial transport medium  Result Value Ref Range Status   SARS Coronavirus 2 by RT PCR NEGATIVE NEGATIVE Final    Comment: (NOTE) SARS-CoV-2 target nucleic acids are NOT DETECTED.  The SARS-CoV-2 RNA is generally detectable in upper respiratory specimens during the acute phase of infection. The lowest concentration of SARS-CoV-2 viral copies this assay can detect is 138 copies/mL. A negative result does not preclude SARS-Cov-2 infection and should not be used as the sole basis for treatment or other  patient management decisions. A negative result may occur with  improper specimen collection/handling, submission of specimen other than nasopharyngeal swab, presence of viral mutation(s) within the areas targeted by this assay, and inadequate number of viral copies(<138 copies/mL). A negative result must be combined with clinical observations, patient history, and epidemiological information. The expected result is Negative.  Fact Sheet for Patients:  EntrepreneurPulse.com.au  Fact Sheet for Healthcare Providers:  IncredibleEmployment.be  This test is no t yet approved or cleared by the [place] FDA and  has been authorized for detection and/or diagnosis of SARS-CoV-2 by FDA under an Emergency Use Authorization (EUA). This EUA will remain  in effect (meaning this test can be used) for the duration of the COVID-19 declaration under Section 564(b)(1) of the Act, 21 U.S.C.section 360bbb-3(b)(1), unless the authorization is terminated  or revoked sooner.       Influenza A by PCR NEGATIVE NEGATIVE Final   Influenza B by PCR NEGATIVE NEGATIVE Final    Comment: (NOTE) The Xpert Xpress SARS-CoV-2/FLU/RSV plus assay is intended as an aid in the diagnosis of influenza from Nasopharyngeal swab specimens and should not be used as a sole basis for treatment. Nasal washings and aspirates are unacceptable for Xpert Xpress SARS-CoV-2/FLU/RSV testing.  Fact Sheet for Patients: EntrepreneurPulse.com.au  Fact Sheet for Healthcare Providers: IncredibleEmployment.be  This test is not yet approved or cleared by the [place] FDA and has been authorized for detection and/or diagnosis of SARS-CoV-2 by FDA under an Emergency  Use Authorization (EUA). This EUA will remain in effect (meaning this test can be used) for the duration of the COVID-19 declaration under Section 564(b)(1) of the Act, 21 U.S.C. section  360bbb-3(b)(1), unless the authorization is terminated or revoked.  Performed at [facility], [place] [address], [place], [place] [address]   Blood Culture (routine x 2)     Status: None (Preliminary result)   Collection Time: 07/20/20  5:36 PM   Specimen: BLOOD  Result Value Ref Range Status   Specimen Description   Final    BLOOD LEFT ANTECUBITAL Performed at [facility] [address] [place], [place] [address]    Special Requests   Final    BOTTLES DRAWN AEROBIC AND ANAEROBIC Blood Culture adequate volume Performed at [facility] [address], [place], [place] [address]    Culture   Final    NO GROWTH 1 DAY Performed at [facility], [place] [address] [place], [place] [address]    Report Status PENDING  Incomplete  Blood Culture (routine x 2)     Status: None (Preliminary result)   Collection Time: 07/20/20  5:41 PM   Specimen: BLOOD  Result Value Ref Range Status   Specimen Description   Final    BLOOD BLOOD RIGHT FOREARM Performed at [facility] [address] [place], [place] [address]    Special Requests   Final    BOTTLES DRAWN AEROBIC AND ANAEROBIC Blood Culture adequate volume Performed at [facility] [address], [place], [place] [address]    Culture   Final    NO GROWTH 1 DAY Performed at [facility], [place] [address], [place], [place] [address]    Report Status PENDING  Incomplete  Urine Culture     Status: None   Collection Time: 07/20/20 10:55 PM   Specimen: Urine, Cystoscope  Result Value Ref Range Status   Specimen Description   Final    CYSTOSCOPY Performed at [facility] [address], [place], [place] [address]    Special Requests   Final    NONE Performed at [facility], [place] [address], [place], [place] [address]    Culture   Final    NO GROWTH Performed at [facility],  [place] [address], [place],  [address]    Report Status 07/22/2020 FINAL  Final    Studies/Results: DG Chest Port 1 View  Result Date: 07/20/2020 CLINICAL DATA:  Questionable sepsis - evaluate for abnormality Altered mental status.  Urinary tract infection. EXAM: PORTABLE CHEST 1 VIEW COMPARISON:  10/26/2019 FINDINGS: Low lung volumes. Normal heart size and mediastinal contours. Minor left lung base atelectasis without confluent airspace disease. No pleural effusion or pneumothorax. No pulmonary edema. No acute osseous abnormalities are seen IMPRESSION: Low lung volumes with minor left lung base atelectasis. Electronically Signed   By: Keith Rake M.D.   On: 07/20/2020 18:01   DG C-Arm 1-60 Min-No Report  Result Date: 07/20/2020 Fluoroscopy was utilized by the requesting physician.  No radiographic interpretation.   CT Renal Stone Study  Result Date: 07/20/2020 CLINICAL DATA:  Sepsis ETI. History of kidney stones. Mild lower abdominal pain EXAM: CT ABDOMEN AND PELVIS WITHOUT CONTRAST TECHNIQUE: Multidetector CT imaging of the abdomen and pelvis was performed following the standard protocol without IV contrast. COMPARISON:  CT 10/25/2019 FINDINGS: Lower chest: Minor basilar atelectasis without confluent airspace disease or pleural effusion  normal heart size. Hepatobiliary: Multiple scattered hepatic cysts are similar to prior exam. Partially distended gallbladder without gallstone or pericholecystic fat stranding. There is no biliary dilatation. Pancreas: No ductal dilatation or inflammation. Spleen: Normal in size without focal abnormality. Adrenals/Urinary Tract: No adrenal nodule. There is thinning of the right renal parenchyma. Moderate right hydronephrosis. 12 mm stone in the right renal pelvis appears nonobstructing, however there is an 8 mm stone in the ureteropelvic junction that may be obstructing. There is mild right perinephric edema. Mild prominence of the right ureter but no  additional ureteral stones. Calcification in the right pelvis represents a phlebolith and is inferolateral to the ureteral insertion. There are no left renal calculi. Slight scarring in the left kidney. No left hydronephrosis. No left perinephric edema. Minimally distended urinary bladder with mild diffuse bladder wall thickening. Stomach/Bowel: Stomach distended with ingested contents/fluid. There is no small bowel obstruction or inflammation. Short appendix tentatively visualized. No appendicitis. Moderate colonic stool burden. There is colonic tortuosity. Diverticulosis of the distal descending and sigmoid colon without diverticulitis. Vascular/Lymphatic: Mild to moderate aortic atherosclerosis. No aortic aneurysm. 10 mm right retroperitoneal lymph node at the level of the lower right kidney, new from prior exam and likely reactive. There are additional scattered retroperitoneal nodes are not enlarged by size criteria. No pelvic adenopathy. Reproductive: Surgical clip or brachytherapy seeds in the prostate. Prostate gland is poorly defined. Other: No free air or ascites. Moderate-sized fat containing supra umbilical hernia contains only fat. There is a fat containing right inguinal hernia that contains small amount of free fluid Musculoskeletal: There are no acute or suspicious osseous abnormalities. Hemi transitional lumbosacral anatomy. Multilevel lumbar spondylosis. IMPRESSION: 1. Moderate right hydronephrosis with perinephric edema. An 8 mm stone in the right ureteropelvic junction may be obstructing. Mild right ureteral prominence is similar to prior exam, without distal ureteric stone. 2. Additional nonobstructing 12 mm stone in the right renal pelvis. 3. Mild diffuse bladder wall thickening, can be seen with urinary tract infection. 4. Colonic diverticulosis without diverticulitis. 5. Fat containing supra umbilical and right inguinal hernias. Aortic Atherosclerosis (ICD10-I70.0). Electronically Signed    By: Keith Rake M.D.   On: 07/20/2020 18:19    Assessment/Plan:  Postoperative day #2 urgent right stenting of infected, obstructing right UPJ stone.  He seems to be doing well.  Blood cultures negative, urine culture pending.  On broad-spectrum antibiotic.  I think it is fine to remove his catheter.  CT scan at the time of this admission revealed a fairly decompressed bladder.  I do not think we have to worry about retention.  Discharge at your leisure on a full 2 weeks of antibiotics.  We will set up an appointment with him for office visit to follow-up as well as to schedule eventual stone management.   LOS: 2 days   Jorja Loa 07/22/2020, 12:15 PM

## 2020-07-22 NOTE — Anesthesia Postprocedure Evaluation (Signed)
Anesthesia Post Note  Patient: [name]  Procedure(s) Performed: CYSTOSCOPY WITH RETROGRADE PYELOGRAM/URETERAL STENT PLACEMENT (Right: Urethra)     Patient location during evaluation: PACU Anesthesia Type: General Level of consciousness: awake and alert Pain management: pain level controlled Vital Signs Assessment: post-procedure vital signs reviewed and stable Respiratory status: spontaneous breathing, nonlabored ventilation, respiratory function stable and patient connected to nasal cannula oxygen Cardiovascular status: blood pressure returned to baseline and stable Postop Assessment: no apparent nausea or vomiting Anesthetic complications: no   No notable events documented.  Last Vitals:  Vitals:   07/21/20 2113 07/22/20 0500  BP: 138/89 119/87  Pulse: 62 (!) 58  Resp: 20 20  Temp: 36.6 C 36.4 C  SpO2: 100% 100%    Last Pain:  Vitals:   07/22/20 0500  TempSrc: Oral  PainSc:                  Elton Heid S

## 2020-07-22 NOTE — Evaluation (Signed)
Occupational Therapy Evaluation Patient Details Name: [name] MRN: [id number] DOB: Aug 04, 1940 Today's Date: 07/22/2020    History of Present Illness patient is a 80 year old male who was admitted with sepsis and UTI. s/p R ureteral stent placement 7/18. PMH: CKD, chronic pain, prostate cancer, TIA, chronic LE weakness, L eye blindness.   Clinical Impression   Patient is a 80 year old male who was admitted for above diagnosis. Patient was at home with wife with independence in ADLs. Patient currently requires min A for bed mobility with use of bed rails with increased cues for safety. Patient required continued education to slow down during tasks with poor carryover. Nursing made aware. Patient required max A to adjust socks. Patient completed toileting tasks with min A with RW to increase independence in ADLs.  Patient would continue to benefit from skilled OT services at this time while admitted and after d/c to address noted deficits in order to improve overall safety and independence in ADLs.      Follow Up Recommendations  Home health OT;Supervision/Assistance - 24 hour    Equipment Recommendations       Recommendations for Other Services       Precautions / Restrictions Precautions Precautions: Fall Precaution Comments: foley cath Restrictions Weight Bearing Restrictions: No      Mobility Bed Mobility Overal bed mobility: Needs Assistance Bed Mobility: Supine to Sit     Supine to sit: Min guard     General bed mobility comments: with use of bed rails and HOB slightly eleveated    Transfers Overall transfer level: Needs assistance Equipment used: Rolling walker (2 wheeled) Transfers: Stand Pivot Transfers;Sit to/from Stand Sit to Stand: Min guard Stand pivot transfers: Min guard       General transfer comment: patient was impulsive and needed cues to slow down when attempting tasks.    Balance                                            ADL either performed or assessed with clinical judgement   ADL Overall ADL's : Needs assistance/impaired Eating/Feeding: Set up;Sitting Eating/Feeding Details (indicate cue type and reason): education on proper positioning for self feeding tasks.     Upper Body Bathing: Set up;Sitting   Lower Body Bathing: Moderate assistance;Sit to/from stand   Upper Body Dressing : Sitting;Set up   Lower Body Dressing: Moderate assistance;Sit to/from stand   Toilet Transfer: Minimal assistance;RW   Toileting- Clothing Manipulation and Hygiene: Min guard;Sit to/from stand       Functional mobility during ADLs: Min guard;Rolling walker;Cueing for safety       Vision Baseline Vision/History:  (L eye blindness) Patient Visual Report: No change from baseline       Perception     Praxis      Pertinent Vitals/Pain Pain Score: 1  Pain Location: on bottom Pain Descriptors / Indicators: Discomfort Pain Intervention(s): Monitored during session     Hand Dominance Right   Extremity/Trunk Assessment Upper Extremity Assessment Upper Extremity Assessment: Overall WFL for tasks assessed   Lower Extremity Assessment Lower Extremity Assessment: Defer to PT evaluation   Cervical / Trunk Assessment Cervical / Trunk Assessment: Normal   Communication Communication Communication: No difficulties   Cognition Arousal/Alertness: Awake/alert Behavior During Therapy: WFL for tasks assessed/performed Overall Cognitive Status: Within Functional Limits for tasks assessed  General Comments: patient was noted to have some confusion but was able to respond to most questions appropirately. durign ADL tasks, patient was noted to have poor memory, and safety awareness   General Comments       Exercises     Shoulder Instructions      Home Living Family/patient expects to be discharged to:: Private residence Living Arrangements: Spouse/significant  other;Children Available Help at Discharge: Family Type of Home: House Home Access: Level entry     [place]: Two level;Able to live on main level with bedroom/bathroom     Bathroom Shower/Tub: Teacher, early years/pre: Handicapped height         Additional Comments: has walker that he does not use at home.,      Prior Functioning/Environment Level of Independence: Independent        Comments: independent ADLs, IADLs and driving         OT Problem List: Decreased strength;Impaired balance (sitting and/or standing);Decreased activity tolerance;Decreased coordination;Decreased safety awareness      OT Treatment/Interventions: Self-care/ADL training;DME and/or AE instruction;Therapeutic activities;Balance training;Therapeutic exercise;Patient/family education    OT Goals(Current goals can be found in the care plan section)    OT Frequency: Min 2X/week   Barriers to D/C:    patient is at home alone during periods of the day per patient report.       Co-evaluation              AM-PAC OT "6 Clicks" Daily Activity     Outcome Measure Help from another person eating meals?: A Little Help from another person taking care of personal grooming?: A Little Help from another person toileting, which includes using toliet, bedpan, or urinal?: A Little Help from another person bathing (including washing, rinsing, drying)?: A Lot Help from another person to put on and taking off regular upper body clothing?: A Little Help from another person to put on and taking off regular lower body clothing?: A Lot 6 Click Score: 16   End of Session Equipment Utilized During Treatment: Gait belt;Rolling walker Nurse Communication: Other (comment) (consulted on patients impulsive tendencies.)  Activity Tolerance: Patient tolerated treatment well Patient left: in chair;with call bell/phone within reach;with chair alarm set  OT Visit Diagnosis: Muscle weakness (generalized)  (M62.81);Unsteadiness on feet (R26.81)                Time: 3382-5053 OT Time Calculation (min): 26 min Charges:  OT General Charges $OT Visit: 1 Visit OT Evaluation $OT Eval Low Complexity: 1 Low OT Treatments $Self Care/Home Management : 8-22 mins  Jackelyn Poling OTR/L, MS Acute Rehabilitation Department Office# [phone] Pager# [phone]   [place] 07/22/2020, 10:13 AM

## 2020-07-22 NOTE — Progress Notes (Signed)
PROGRESS NOTE    [name]  AOZ:[id number] DOB: 27-Mar-1940 DOA: 07/20/2020 PCP: Milford Cage, PA    Brief Narrative:  80 year old gentleman with history of hypertension, left eye glaucoma with blindness, history of prostate cancer status post radiation treatment 2014, CKD stage IIIb with baseline creatinine about 1.6-1.8 and multiple complicated UTIs presented to the emergency room with abdominal pain and confusion.  Patient has been experiencing progressively worsening lower abdominal pain for last several weeks.  He went to see his urologist in the afternoon, urinalysis was collected and patient was sent home on Augmentin.  Patient went home took 1 dose of Augmentin, felt very weak and was noted to be confused so EMS was called and patient was brought to the ER. In the emergency room lactic acid 2, acute kidney injury, CT scan with evidence of 8 mm right UPJ stone that is obstructing with evidence of hydronephrosis and perinephric edema.  Started on IV antibiotics and IV fluids, taken to operating room with cystoscopy and left-sided ureteric stent placement.   Assessment & Plan:   Principal Problem:   Sepsis secondary to UTI [facility]) Active Problems:   Essential hypertension   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HCC)   History of prostate cancer   Lactic acidosis   Right ureteral stone   Hydronephrosis of right kidney   Acute metabolic encephalopathy  Sepsis secondary to complicated UTI/infected obstructive renal stone: Status post cystoscopy and left-sided ureteric stent placement. Previous history of ESBL E. coli, currently on meropenem that we will continue until final blood cultures and urine cultures. Stent management and stone management as per urology. Some clinical improvement today. Voiding trial today.  Acute renal failure superimposed on chronic kidney disease stage IIIb: Secondary to volume depletion and obstruction.  Hydrated  with intravenous fluids.  Already improving.  Has a Foley catheter postop. Discontinue IV fluids.  Voiding trial today.  Renal functions improving to his levels.  Essential hypertension: Blood pressures fairly normal.  Antihypertensives on hold.  Acute metabolic encephalopathy: Probably due to #1.  Mental status has normalized.  No focal deficits.  Start mobilizing.  PT OT.  DVT prophylaxis: enoxaparin (LOVENOX) injection 40 mg Start: 07/22/20 1800   Code Status: Full code. Family Communication: Wife on the phone. Disposition Plan: Status is: Inpatient  Remains inpatient appropriate because:Inpatient level of care appropriate due to severity of illness  Dispo: The patient is from: Home              Anticipated d/c is to: Home              Patient currently is not medically stable to d/c.   Difficult to place patient No         Consultants:  Urology  Procedures:  7/18, left stent  Antimicrobials:  Meropenem 7/18----   Subjective: Patient seen and examined.  No overnight events.  He is still has some penile pain. 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  He will talk to his urologist about this.  Objective: Vitals:   07/21/20 0426 07/21/20 1359 07/21/20 2113 07/22/20 0500  BP: (!) 152/77 135/79 138/89 119/87  Pulse: 66 80 62 (!) 58  Resp: 20  20 20   Temp: 98.1 F (36.7 C) 97.7 F (36.5 C) 97.8 F (36.6 C) 97.6 F (36.4 C)  TempSrc: Oral Oral Oral Oral  SpO2: 100% 100% 100% 100%  Weight:  Height:        Intake/Output Summary (Last 24 hours) at 07/22/2020 1322 Last data filed at 07/22/2020 0533 Gross per 24 hour  Intake 1390.75 ml  Output 950 ml  Net 440.75 ml   Filed Weights   07/20/20 1850  Weight: 84.8 kg    Examination:  General exam: Appears calm and comfortable  Sitting in chair and eating lunch. Respiratory system: Clear to auscultation. Respiratory effort normal. Cardiovascular system: S1 & S2  heard, RRR. No JVD, murmurs, rubs, gallops or clicks. No pedal edema. Gastrointestinal system: Soft and nontender.  Bowel sounds present. Foley catheter with clear urine. He has fairly large hydrocele on both the scrotum. Central nervous system: Alert and oriented. No focal neurological deficits.     Data Reviewed: I have personally reviewed following labs and imaging studies  CBC: Recent Labs  Lab 07/20/20 1736 07/21/20 0347 07/22/20 0336  WBC 7.9 8.3 7.3  NEUTROABS 7.6 7.8* 6.3  HGB 10.1* 8.9* 9.0*  HCT 32.2* 28.6* 28.4*  MCV 104.5* 106.3* 103.6*  PLT 95* 94* 82*   Basic Metabolic Panel: Recent Labs  Lab 07/20/20 1736 07/21/20 0347 07/22/20 0336  NA 135 139 141  K 4.1 4.9 4.6  CL 103 108 107  CO2 24 25 24   GLUCOSE 148* 159* 158*  BUN 27* 26* 35*  CREATININE 2.61* 2.36* 2.09*  CALCIUM 9.0 8.8* 9.1  MG  --  2.4  --    GFR: Estimated Creatinine Clearance: 31.5 mL/min (A) (by C-G formula based on SCr of 2.09 mg/dL (H)). Liver Function Tests: Recent Labs  Lab 07/20/20 1736 07/21/20 0347  AST 21 24  ALT 15 16  ALKPHOS 55 48  BILITOT 0.7 0.6  PROT 7.6 6.9  ALBUMIN 3.5 3.0*   No results for input(s): LIPASE, AMYLASE in the last 168 hours. No results for input(s): AMMONIA in the last 168 hours. Coagulation Profile: Recent Labs  Lab 07/20/20 1736 07/21/20 0347  INR 1.5* 1.2   Cardiac Enzymes: No results for input(s): CKTOTAL, CKMB, CKMBINDEX, TROPONINI in the last 168 hours. BNP (last 3 results) No results for input(s): PROBNP in the last 8760 hours. HbA1C: No results for input(s): HGBA1C in the last 72 hours. CBG: No results for input(s): GLUCAP in the last 168 hours. Lipid Profile: No results for input(s): CHOL, HDL, LDLCALC, TRIG, CHOLHDL, LDLDIRECT in the last 72 hours. Thyroid Function Tests: No results for input(s): TSH, T4TOTAL, FREET4, T3FREE, THYROIDAB in the last 72 hours. Anemia Panel: No results for input(s): VITAMINB12, FOLATE,  FERRITIN, TIBC, IRON, RETICCTPCT in the last 72 hours. Sepsis Labs: Recent Labs  Lab 07/20/20 1736 07/20/20 1936  LATICACIDVEN 2.0* 1.3    Recent Results (from the past 240 hour(s))  Resp Panel by RT-PCR (Flu A&B, Covid) Nasopharyngeal Swab     Status: None   Collection Time: 07/20/20  5:36 PM   Specimen: Nasopharyngeal Swab; Nasopharyngeal(NP) swabs in vial transport medium  Result Value Ref Range Status   SARS Coronavirus 2 by RT PCR NEGATIVE NEGATIVE Final    Comment: (NOTE) SARS-CoV-2 target nucleic acids are NOT DETECTED.  The SARS-CoV-2 RNA is generally detectable in upper respiratory specimens during the acute phase of infection. The lowest concentration of SARS-CoV-2 viral copies this assay can detect is 138 copies/mL. A negative result does not preclude SARS-Cov-2 infection and should not be used as the sole basis for treatment or other patient management decisions. A negative result may occur with  improper specimen collection/handling, submission of specimen  other than nasopharyngeal swab, presence of viral mutation(s) within the areas targeted by this assay, and inadequate number of viral copies(<138 copies/mL). A negative result must be combined with clinical observations, patient history, and epidemiological information. The expected result is Negative.  Fact Sheet for Patients:  EntrepreneurPulse.com.au  Fact Sheet for Healthcare Providers:  IncredibleEmployment.be  This test is no t yet approved or cleared by the [place] FDA and  has been authorized for detection and/or diagnosis of SARS-CoV-2 by FDA under an Emergency Use Authorization (EUA). This EUA will remain  in effect (meaning this test can be used) for the duration of the COVID-19 declaration under Section 564(b)(1) of the Act, 21 U.S.C.section 360bbb-3(b)(1), unless the authorization is terminated  or revoked sooner.       Influenza A by PCR NEGATIVE  NEGATIVE Final   Influenza B by PCR NEGATIVE NEGATIVE Final    Comment: (NOTE) The Xpert Xpress SARS-CoV-2/FLU/RSV plus assay is intended as an aid in the diagnosis of influenza from Nasopharyngeal swab specimens and should not be used as a sole basis for treatment. Nasal washings and aspirates are unacceptable for Xpert Xpress SARS-CoV-2/FLU/RSV testing.  Fact Sheet for Patients: EntrepreneurPulse.com.au  Fact Sheet for Healthcare Providers: IncredibleEmployment.be  This test is not yet approved or cleared by the [place] FDA and has been authorized for detection and/or diagnosis of SARS-CoV-2 by FDA under an Emergency Use Authorization (EUA). This EUA will remain in effect (meaning this test can be used) for the duration of the COVID-19 declaration under Section 564(b)(1) of the Act, 21 U.S.C. section 360bbb-3(b)(1), unless the authorization is terminated or revoked.  Performed at [facility], [place] [address], [place], [place] [address]   Blood Culture (routine x 2)     Status: None (Preliminary result)   Collection Time: 07/20/20  5:36 PM   Specimen: BLOOD  Result Value Ref Range Status   Specimen Description   Final    BLOOD LEFT ANTECUBITAL Performed at [place] [address] [place], [place] [address]    Special Requests   Final    BOTTLES DRAWN AEROBIC AND ANAEROBIC Blood Culture adequate volume Performed at [place] [address] [place], [place] [address]    Culture   Final    NO GROWTH 1 DAY Performed at [facility], [place] [address] [place], [place] [address]    Report Status PENDING  Incomplete  Blood Culture (routine x 2)     Status: None (Preliminary result)   Collection Time: 07/20/20  5:41 PM   Specimen: BLOOD  Result Value Ref Range Status   Specimen Description   Final    BLOOD BLOOD RIGHT FOREARM Performed at [place] [address], [place], [place] [address]    Special Requests   Final    BOTTLES DRAWN AEROBIC AND ANAEROBIC Blood Culture adequate volume Performed at [place] [address], [place], [place] [address]    Culture   Final    NO GROWTH 1 DAY Performed at [facility], [place] [address] [place], [place] [address]    Report Status PENDING  Incomplete  Urine Culture     Status: None   Collection Time: 07/20/20 10:55 PM   Specimen: Urine, Cystoscope  Result Value Ref Range Status   Specimen Description   Final    CYSTOSCOPY Performed at [place] [address] [place], [place] [address]  Special Requests   Final    NONE Performed at [facility], [place] [address], [place], [place] [address]    Culture   Final    NO GROWTH Performed at [facility], [place] [address] [place], [place] [address]    Report Status 07/22/2020 FINAL  Final         Radiology Studies: DG Chest Port 1 View  Result Date: 07/20/2020 CLINICAL DATA:  Questionable sepsis - evaluate for abnormality Altered mental status.  Urinary tract infection. EXAM: PORTABLE CHEST 1 VIEW COMPARISON:  10/26/2019 FINDINGS: Low lung volumes. Normal heart size and mediastinal contours. Minor left lung base atelectasis without confluent airspace disease. No pleural effusion or pneumothorax. No pulmonary edema. No acute osseous abnormalities are seen IMPRESSION: Low lung volumes with minor left lung base atelectasis. Electronically Signed   By: Keith Rake M.D.   On: 07/20/2020 18:01   DG C-Arm 1-60 Min-No Report  Result Date: 07/20/2020 Fluoroscopy was utilized by the requesting physician.  No radiographic interpretation.   CT Renal Stone Study  Result Date: 07/20/2020 CLINICAL DATA:  Sepsis ETI. History of kidney stones. Mild lower abdominal pain EXAM: CT ABDOMEN AND PELVIS WITHOUT CONTRAST TECHNIQUE: Multidetector CT imaging of the  abdomen and pelvis was performed following the standard protocol without IV contrast. COMPARISON:  CT 10/25/2019 FINDINGS: Lower chest: Minor basilar atelectasis without confluent airspace disease or pleural effusion normal heart size. Hepatobiliary: Multiple scattered hepatic cysts are similar to prior exam. Partially distended gallbladder without gallstone or pericholecystic fat stranding. There is no biliary dilatation. Pancreas: No ductal dilatation or inflammation. Spleen: Normal in size without focal abnormality. Adrenals/Urinary Tract: No adrenal nodule. There is thinning of the right renal parenchyma. Moderate right hydronephrosis. 12 mm stone in the right renal pelvis appears nonobstructing, however there is an 8 mm stone in the ureteropelvic junction that may be obstructing. There is mild right perinephric edema. Mild prominence of the right ureter but no additional ureteral stones. Calcification in the right pelvis represents a phlebolith and is inferolateral to the ureteral insertion. There are no left renal calculi. Slight scarring in the left kidney. No left hydronephrosis. No left perinephric edema. Minimally distended urinary bladder with mild diffuse bladder wall thickening. Stomach/Bowel: Stomach distended with ingested contents/fluid. There is no small bowel obstruction or inflammation. Short appendix tentatively visualized. No appendicitis. Moderate colonic stool burden. There is colonic tortuosity. Diverticulosis of the distal descending and sigmoid colon without diverticulitis. Vascular/Lymphatic: Mild to moderate aortic atherosclerosis. No aortic aneurysm. 10 mm right retroperitoneal lymph node at the level of the lower right kidney, new from prior exam and likely reactive. There are additional scattered retroperitoneal nodes are not enlarged by size criteria. No pelvic adenopathy. Reproductive: Surgical clip or brachytherapy seeds in the prostate. Prostate gland is poorly defined. Other: No  free air or ascites. Moderate-sized fat containing supra umbilical hernia contains only fat. There is a fat containing right inguinal hernia that contains small amount of free fluid Musculoskeletal: There are no acute or suspicious osseous abnormalities. Hemi transitional lumbosacral anatomy. Multilevel lumbar spondylosis. IMPRESSION: 1. Moderate right hydronephrosis with perinephric edema. An 8 mm stone in the right ureteropelvic junction may be obstructing. Mild right ureteral prominence is similar to prior exam, without distal ureteric stone. 2. Additional nonobstructing 12 mm stone in the right renal pelvis. 3. Mild diffuse bladder wall thickening, can be seen with urinary tract infection. 4. Colonic diverticulosis without diverticulitis. 5. Fat containing supra umbilical and right inguinal  hernias. Aortic Atherosclerosis (ICD10-I70.0). Electronically Signed   By: Keith Rake M.D.   On: 07/20/2020 18:19        Scheduled Meds:  (feeding supplement) PROSource Plus  30 mL Oral BID BM   atenolol  50 mg Oral Daily   brimonidine  1 drop Left Eye Q8H   Chlorhexidine Gluconate Cloth  6 each Topical Daily   enoxaparin (LOVENOX) injection  40 mg Subcutaneous Q24H   feeding supplement  1 Container Oral BID BM   latanoprost  1 drop Both Eyes QHS   multivitamin with minerals  1 tablet Oral Daily   senna-docusate  1 tablet Oral BID   timolol  1 drop Both Eyes BID   Continuous Infusions:  meropenem (MERREM) IV 1 g (07/22/20 1022)     LOS: 2 days    Time spent: 32 minutes    Barb Merino, MD [facility] Pager [phone]

## 2020-07-23 DIAGNOSIS — N390 Urinary tract infection, site not specified: Secondary | ICD-10-CM | POA: Diagnosis not present

## 2020-07-23 DIAGNOSIS — A419 Sepsis, unspecified organism: Secondary | ICD-10-CM | POA: Diagnosis not present

## 2020-07-23 LAB — BASIC METABOLIC PANEL
Anion gap: 9 (ref 5–15)
BUN: 34 mg/dL — ABNORMAL HIGH (ref 8–23)
CO2: 26 mmol/L (ref 22–32)
Calcium: 9.1 mg/dL (ref 8.9–10.3)
Chloride: 107 mmol/L (ref 98–111)
Creatinine, Ser: 2.01 mg/dL — ABNORMAL HIGH (ref 0.61–1.24)
GFR, Estimated: 33 mL/min — ABNORMAL LOW (ref >60)
Glucose, Bld: 100 mg/dL — ABNORMAL HIGH (ref 70–99)
Potassium: 4.4 mmol/L (ref 3.5–5.1)
Sodium: 142 mmol/L (ref 135–145)

## 2020-07-23 LAB — CBC WITH DIFFERENTIAL/PLATELET
Abs Immature Granulocytes: 0.07 10*3/uL (ref 0.00–0.07)
Basophils Absolute: 0 10*3/uL (ref 0.0–0.1)
Basophils Relative: 0 %
Eosinophils Absolute: 0 10*3/uL (ref 0.0–0.5)
Eosinophils Relative: 0 %
HCT: 27.6 % — ABNORMAL LOW (ref 39.0–52.0)
Hemoglobin: 8.6 g/dL — ABNORMAL LOW (ref 13.0–17.0)
Immature Granulocytes: 2 %
Lymphocytes Relative: 14 %
Lymphs Abs: 0.7 10*3/uL (ref 0.7–4.0)
MCH: 32.6 pg (ref 26.0–34.0)
MCHC: 31.2 g/dL (ref 30.0–36.0)
MCV: 104.5 fL — ABNORMAL HIGH (ref 80.0–100.0)
Monocytes Absolute: 0.4 10*3/uL (ref 0.1–1.0)
Monocytes Relative: 8 %
Neutro Abs: 3.6 10*3/uL (ref 1.7–7.7)
Neutrophils Relative %: 76 %
Platelets: 112 10*3/uL — ABNORMAL LOW (ref 150–400)
RBC: 2.64 MIL/uL — ABNORMAL LOW (ref 4.22–5.81)
RDW: 13.1 % (ref 11.5–15.5)
WBC: 4.7 10*3/uL (ref 4.0–10.5)
nRBC: 0 % (ref 0.0–0.2)

## 2020-07-23 MED ORDER — FOSFOMYCIN TROMETHAMINE 3 G PO PACK: 3.0000 g | PACK | Freq: Once | ORAL | 0 refills | Status: AC

## 2020-07-23 MED ORDER — FOSFOMYCIN TROMETHAMINE 3 G PO PACK: 3.0000 g | PACK | Freq: Once | ORAL | Status: DC

## 2020-07-23 MED ORDER — FOSFOMYCIN TROMETHAMINE 3 G PO PACK
3.0000 g | PACK | Freq: Once | ORAL | Status: AC
  Administered 2020-07-23: 3 g via ORAL
  Filled 2020-07-23: qty 3

## 2020-07-23 NOTE — Progress Notes (Signed)
PT Cancellation Note  Patient Details Name: [name] MRN: [id number] DOB: 05/10/1940   Cancelled Treatment:    Reason Eval/Treat Not Completed: Patient declined, no reason specified (pt stating not a good time, he will do it when he wants to do it. Pt did not specify time that would be more agreeble despite therapist asking/encouraging pt to designate a time that works for him. Will follow up at later date/time as schedule allows.)  Gwynneth Albright PT, DPT Acute Rehabilitation Services Office [phone] Pager [phone]

## 2020-07-23 NOTE — Plan of Care (Signed)
  Problem: Education: Goal: Knowledge of General Education information will improve Description Including pain rating scale, medication(s)/side effects and non-pharmacologic comfort measures Outcome: Progressing   Problem: Health Behavior/Discharge Planning: Goal: Ability to manage health-related needs will improve Outcome: Progressing   

## 2020-07-23 NOTE — Plan of Care (Signed)
  Problem: Education: Goal: Knowledge of General Education information will improve Description: Including pain rating scale, medication(s)/side effects and non-pharmacologic comfort measures 07/23/2020 1632 by Duncan Dull, RN Outcome: Adequate for Discharge 07/23/2020 0944 by Duncan Dull, RN Outcome: Progressing   Problem: Health Behavior/Discharge Planning: Goal: Ability to manage health-related needs will improve 07/23/2020 1632 by Duncan Dull, RN Outcome: Adequate for Discharge 07/23/2020 0944 by Toniya Rozar, Rollen Sox, RN Outcome: Progressing   Problem: Clinical Measurements: Goal: Ability to maintain clinical measurements within normal limits will improve Outcome: Adequate for Discharge Goal: Will remain free from infection Outcome: Adequate for Discharge Goal: Diagnostic test results will improve Outcome: Adequate for Discharge Goal: Respiratory complications will improve Outcome: Adequate for Discharge Goal: Cardiovascular complication will be avoided Outcome: Adequate for Discharge   Problem: Activity: Goal: Risk for activity intolerance will decrease Outcome: Adequate for Discharge   Problem: Nutrition: Goal: Adequate nutrition will be maintained Outcome: Adequate for Discharge   Problem: Coping: Goal: Level of anxiety will decrease Outcome: Adequate for Discharge   Problem: Elimination: Goal: Will not experience complications related to bowel motility Outcome: Adequate for Discharge Goal: Will not experience complications related to urinary retention Outcome: Adequate for Discharge   Problem: Pain Managment: Goal: General experience of comfort will improve Outcome: Adequate for Discharge   Problem: Safety: Goal: Ability to remain free from injury will improve Outcome: Adequate for Discharge   Problem: Skin Integrity: Goal: Risk for impaired skin integrity will decrease Outcome: Adequate for Discharge

## 2020-07-23 NOTE — Progress Notes (Signed)
Pt discharged to home, instructions reviewed with pt and spouse, acknowledged understanding of instructions. SRP, RN

## 2020-07-23 NOTE — TOC Progression Note (Signed)
Transition of Care [facility]) - Progression Note    Patient Details  Name: [name] MRN: [id number] Date of Birth: 05/30/1940  Transition of Care [facility]) CM/SW Contact  Purcell Mouton, RN Phone Number: 07/23/2020, 11:07 AM  Clinical Narrative:    Family declined [place] at present time.    Expected Discharge Plan: [place] Barriers to Discharge: No Barriers Identified  Expected Discharge Plan and Services Expected Discharge Plan: [place] arrangements for the past 2 months: Single Family Home Expected Discharge Date: 07/23/20                                     Social Determinants of Health (SDOH) Interventions    Readmission Risk Interventions Readmission Risk Prevention Plan 10/31/2019  Transportation Screening Complete  Some recent data might be hidden

## 2020-07-23 NOTE — Discharge Summary (Addendum)
Physician Discharge Summary  HAI GRABE AOZ:[id number] DOB: 08/20/1940 DOA: 07/20/2020  PCP: Milford Cage, PA  Admit date: 07/20/2020 Discharge date: 07/23/2020  Admitted From: home  Disposition:  home   Recommendations for Outpatient Follow-up:  Follow up with PCP in 1-2 weeks Please obtain BMP/CBC in one week Urology office to schedule follow up for you   Home Health:declined   Equipment/Devices:NA   Discharge Condition:Stable  CODE STATUS:Full Code  Diet recommendation: low salt diet   Discharge Summary: 80 year old gentleman with history of hypertension, left eye glaucoma with blindness, history of prostate cancer status post radiation treatment 2014, CKD stage IIIb with baseline creatinine about 1.6-1.8 and multiple complicated UTIs presented to the emergency room with abdominal pain and confusion.  Patient has been experiencing progressively worsening lower abdominal pain for last several weeks.  He went to see his urologist in the afternoon, urinalysis was collected and patient was sent home on Augmentin.  Patient went home took 1 dose of Augmentin, felt very weak and was noted to be confused so EMS was called and patient was brought to the ER. In the emergency room lactic acid 2, acute kidney injury, CT scan with evidence of 8 mm right UPJ stone that is obstructing with evidence of hydronephrosis and perinephric edema.  Started on IV antibiotics and IV fluids, taken to operating room with cystoscopy and left-sided ureteric stent placement.  Sepsis secondary to complicated UTI/infected obstructive renal stone: Status post cystoscopy and left-sided ureteric stent placement. Quickly improved after relief of obstruction.  Urine cultures and blood cultures negative. Previous history of ESBL E. coli.  Received meropenem for 4 days.  Will give 1 dose of fosfomycin today.   Patient has clinically improved, Urine culture growing E Coli , suspect ESBL, repeat dose of fosfomycin on  7/24 sent to pharmacy.  Stent management and stone management as per urology.  Patient also has bilateral hydroceles and followed by urology. Successful voiding trial, mostly incontinent.  Acute renal failure superimposed on chronic kidney disease stage IIIb: Secondary to volume depletion and obstruction.  Hydrated with intravenous fluids.  Renal functions improved to his baseline.  Initially treated with intravenous fluids and now keeping up with oral intake.  Essential hypertension: Blood pressures are picking up now.  He will resume his blood pressure medications.  Acute metabolic encephalopathy: Probably due to #1.  Mental status has normalized.  He worked with PT OT.  Was offered choice for home health PT OT, however has enough support at home so declined.  Patient is medically stabilized to discharge.  He will follow-up at urology office for further management of his stent and stone.  Patient was treated with meropenem 7/18-7/21 per 4 days Fosfomycin 7/21 3 g 1 dose Fosfomycin 7/24 3g 1 dose sent to the pharmacy Blood cultures negative.  This should complete his antibiotic therapy for UTI with negative blood cultures.   Discharge Diagnoses:  Principal Problem:   Sepsis secondary to UTI [facility]) Active Problems:   Essential hypertension   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HCC)   History of prostate cancer   Lactic acidosis   Right ureteral stone   Hydronephrosis of right kidney   Acute metabolic encephalopathy    Discharge Instructions  Discharge Instructions     Call MD for:  severe uncontrolled pain   Complete by: As directed    Call MD for:  temperature >100.4   Complete by: As directed    Diet -  low sodium heart healthy   Complete by: As directed    Discharge instructions   Complete by: As directed    Follow-up with urologist as scheduled.   Increase activity slowly   Complete by: As directed       Allergies as of 07/23/2020        Reactions   Tape Other (See Comments)   Redness and blisters/paper tape-not latex   Doxycycline Hyclate Other (See Comments)   Mental changes    Sulfa Antibiotics Hives        Medication List     STOP taking these medications    amoxicillin-clavulanate 500-125 MG tablet Commonly known as: AUGMENTIN   aspirin 81 MG EC tablet       TAKE these medications    acetaminophen 325 MG tablet Commonly known as: TYLENOL Take 2 tablets (650 mg total) by mouth every 6 (six) hours as needed for mild pain (or Fever >/= 101).   Alphagan P 0.1 % Soln Generic drug: brimonidine Place 1 drop into the right eye every 8 (eight) hours.   ALPRAZolam 0.5 MG tablet Commonly known as: XANAX Take 0.5 mg by mouth 2 (two) times daily as needed for anxiety or sleep.   amLODipine 10 MG tablet Commonly known as: NORVASC Take 1 tablet (10 mg total) by mouth daily.   atenolol 50 MG tablet Commonly known as: TENORMIN Take 1 tablet (50 mg total) by mouth daily. Pt needs appointment for further refills   fosfomycin 3 g Pack Commonly known as: MONUROL Take 3 g by mouth once for 1 dose. Start taking on: July 26, 2020   HYDROcodone-acetaminophen 10-325 MG tablet Commonly known as: NORCO Take 1 tablet by mouth 4 (four) times daily as needed for moderate pain or severe pain.   polyethylene glycol 17 g packet Commonly known as: MIRALAX / GLYCOLAX Take 17 g by mouth daily as needed for mild constipation (For mild constipation not relieved by Senokot).   Potassium Citrate 15 MEQ (1620 MG) Tbcr Take 15 mEq by mouth 3 (three) times daily.   Rocklatan 0.02-0.005 % Soln Generic drug: Netarsudil-Latanoprost Place 1 drop into both eyes at bedtime.   timolol 0.5 % ophthalmic solution Commonly known as: TIMOPTIC Place 1 drop into both eyes daily.         Follow-up Information     Milford Cage, PA Follow up in 2 week(s).   Specialty: Physician Assistant Contact information: [place] [address] [phone]         Franchot Gallo, MD Follow up.   Specialty: Urology Why: We will call you to set up follow-up appointment Contact information: [address] [place] [address] [phone]                Allergies  Allergen Reactions   Tape Other (See Comments)    Redness and blisters/paper tape-not latex   Doxycycline Hyclate Other (See Comments)    Mental changes    Sulfa Antibiotics Hives    Consultations: Urology   Procedures/Studies: DG Chest Port 1 View  Result Date: 07/20/2020 CLINICAL DATA:  Questionable sepsis - evaluate for abnormality Altered mental status.  Urinary tract infection. EXAM: PORTABLE CHEST 1 VIEW COMPARISON:  10/26/2019 FINDINGS: Low lung volumes. Normal heart size and mediastinal contours. Minor left lung base atelectasis without confluent airspace disease. No pleural effusion or pneumothorax. No pulmonary edema. No acute osseous abnormalities are seen IMPRESSION: Low lung volumes with minor left lung base atelectasis.  Electronically Signed   By: Keith Rake M.D.   On: 07/20/2020 18:01   DG C-Arm 1-60 Min-No Report  Result Date: 07/20/2020 Fluoroscopy was utilized by the requesting physician.  No radiographic interpretation.   CT Renal Stone Study  Result Date: 07/20/2020 CLINICAL DATA:  Sepsis ETI. History of kidney stones. Mild lower abdominal pain EXAM: CT ABDOMEN AND PELVIS WITHOUT CONTRAST TECHNIQUE: Multidetector CT imaging of the abdomen and pelvis was performed following the standard protocol without IV contrast. COMPARISON:  CT 10/25/2019 FINDINGS: Lower chest: Minor basilar atelectasis without confluent airspace disease or pleural effusion normal heart size. Hepatobiliary: Multiple scattered hepatic cysts are similar to prior exam. Partially distended gallbladder without gallstone or pericholecystic fat stranding. There is no biliary dilatation. Pancreas: No ductal dilatation or  inflammation. Spleen: Normal in size without focal abnormality. Adrenals/Urinary Tract: No adrenal nodule. There is thinning of the right renal parenchyma. Moderate right hydronephrosis. 12 mm stone in the right renal pelvis appears nonobstructing, however there is an 8 mm stone in the ureteropelvic junction that may be obstructing. There is mild right perinephric edema. Mild prominence of the right ureter but no additional ureteral stones. Calcification in the right pelvis represents a phlebolith and is inferolateral to the ureteral insertion. There are no left renal calculi. Slight scarring in the left kidney. No left hydronephrosis. No left perinephric edema. Minimally distended urinary bladder with mild diffuse bladder wall thickening. Stomach/Bowel: Stomach distended with ingested contents/fluid. There is no small bowel obstruction or inflammation. Short appendix tentatively visualized. No appendicitis. Moderate colonic stool burden. There is colonic tortuosity. Diverticulosis of the distal descending and sigmoid colon without diverticulitis. Vascular/Lymphatic: Mild to moderate aortic atherosclerosis. No aortic aneurysm. 10 mm right retroperitoneal lymph node at the level of the lower right kidney, new from prior exam and likely reactive. There are additional scattered retroperitoneal nodes are not enlarged by size criteria. No pelvic adenopathy. Reproductive: Surgical clip or brachytherapy seeds in the prostate. Prostate gland is poorly defined. Other: No free air or ascites. Moderate-sized fat containing supra umbilical hernia contains only fat. There is a fat containing right inguinal hernia that contains small amount of free fluid Musculoskeletal: There are no acute or suspicious osseous abnormalities. Hemi transitional lumbosacral anatomy. Multilevel lumbar spondylosis. IMPRESSION: 1. Moderate right hydronephrosis with perinephric edema. An 8 mm stone in the right ureteropelvic junction may be  obstructing. Mild right ureteral prominence is similar to prior exam, without distal ureteric stone. 2. Additional nonobstructing 12 mm stone in the right renal pelvis. 3. Mild diffuse bladder wall thickening, can be seen with urinary tract infection. 4. Colonic diverticulosis without diverticulitis. 5. Fat containing supra umbilical and right inguinal hernias. Aortic Atherosclerosis (ICD10-I70.0). Electronically Signed   By: Keith Rake M.D.   On: 07/20/2020 18:19   (Echo, Carotid, EGD, Colonoscopy, ERCP)    Subjective: Patient was seen and examined.  Denies any complaints.  No trouble urinating, he is mostly incontinent.  Eager to go home.  No family at bedside.   Discharge Exam: Vitals:   07/23/20 0908 07/23/20 0911  BP: (!) 151/86 (!) 151/86  Pulse: 85 85  Resp:    Temp:    SpO2:     Vitals:   07/22/20 2052 07/23/20 0617 07/23/20 0908 07/23/20 0911  BP: (!) 153/84 (!) 154/79 (!) 151/86 (!) 151/86  Pulse: 63 64 85 85  Resp: 17 20    Temp: 97.9 F (36.6 C) 98.1 F (36.7 C)    TempSrc: Oral Oral  SpO2: 100% 99%    Weight:      Height:        General: Pt is alert, awake, not in acute distress Cardiovascular: RRR, S1/S2 +, no rubs, no gallops Respiratory: CTA bilaterally, no wheezing, no rhonchi Abdominal: Soft, NT, ND, bowel sounds + Extremities: no edema, no cyanosis Urogenital exam: Patient has large bilateral hydroceles.  Penis is embedded in hydrocele.  He had clear urine leaking and was incontinent into depends.    The results of significant diagnostics from this hospitalization (including imaging, microbiology, ancillary and laboratory) are listed below for reference.     Microbiology: Recent Results (from the past 240 hour(s))  Resp Panel by RT-PCR (Flu A&B, Covid) Nasopharyngeal Swab     Status: None   Collection Time: 07/20/20  5:36 PM   Specimen: Nasopharyngeal Swab; Nasopharyngeal(NP) swabs in vial transport medium  Result Value Ref Range Status    SARS Coronavirus 2 by RT PCR NEGATIVE NEGATIVE Final    Comment: (NOTE) SARS-CoV-2 target nucleic acids are NOT DETECTED.  The SARS-CoV-2 RNA is generally detectable in upper respiratory specimens during the acute phase of infection. The lowest concentration of SARS-CoV-2 viral copies this assay can detect is 138 copies/mL. A negative result does not preclude SARS-Cov-2 infection and should not be used as the sole basis for treatment or other patient management decisions. A negative result may occur with  improper specimen collection/handling, submission of specimen other than nasopharyngeal swab, presence of viral mutation(s) within the areas targeted by this assay, and inadequate number of viral copies(<138 copies/mL). A negative result must be combined with clinical observations, patient history, and epidemiological information. The expected result is Negative.  Fact Sheet for Patients:  EntrepreneurPulse.com.au  Fact Sheet for Healthcare Providers:  IncredibleEmployment.be  This test is no t yet approved or cleared by the [place] FDA and  has been authorized for detection and/or diagnosis of SARS-CoV-2 by FDA under an Emergency Use Authorization (EUA). This EUA will remain  in effect (meaning this test can be used) for the duration of the COVID-19 declaration under Section 564(b)(1) of the Act, 21 U.S.C.section 360bbb-3(b)(1), unless the authorization is terminated  or revoked sooner.       Influenza A by PCR NEGATIVE NEGATIVE Final   Influenza B by PCR NEGATIVE NEGATIVE Final    Comment: (NOTE) The Xpert Xpress SARS-CoV-2/FLU/RSV plus assay is intended as an aid in the diagnosis of influenza from Nasopharyngeal swab specimens and should not be used as a sole basis for treatment. Nasal washings and aspirates are unacceptable for Xpert Xpress SARS-CoV-2/FLU/RSV testing.  Fact Sheet for  Patients: EntrepreneurPulse.com.au  Fact Sheet for Healthcare Providers: IncredibleEmployment.be  This test is not yet approved or cleared by the [place] FDA and has been authorized for detection and/or diagnosis of SARS-CoV-2 by FDA under an Emergency Use Authorization (EUA). This EUA will remain in effect (meaning this test can be used) for the duration of the COVID-19 declaration under Section 564(b)(1) of the Act, 21 U.S.C. section 360bbb-3(b)(1), unless the authorization is terminated or revoked.  Performed at [facility], [place] [address], [place], [place] [address]   Blood Culture (routine x 2)     Status: None (Preliminary result)   Collection Time: 07/20/20  5:36 PM   Specimen: BLOOD  Result Value Ref Range Status   Specimen Description   Final    BLOOD LEFT ANTECUBITAL Performed at [place] [address], [place], [place] [address]  Special Requests   Final    BOTTLES DRAWN AEROBIC AND ANAEROBIC Blood Culture adequate volume Performed at [place] [address], [place], [place] [address]    Culture   Final    NO GROWTH 1 DAY Performed at [facility], [place] [address], [place], [place] [address]    Report Status PENDING  Incomplete  Blood Culture (routine x 2)     Status: None (Preliminary result)   Collection Time: 07/20/20  5:41 PM   Specimen: BLOOD  Result Value Ref Range Status   Specimen Description   Final    BLOOD BLOOD RIGHT FOREARM Performed at [place] [address], [place], [place] [address]    Special Requests   Final    BOTTLES DRAWN AEROBIC AND ANAEROBIC Blood Culture adequate volume Performed at [place] [address], [place], [place] [address]    Culture   Final    NO GROWTH 1 DAY Performed at [facility], [place] [address], [place], [place] [address]    Report Status  PENDING  Incomplete  Urine Culture     Status: Abnormal (Preliminary result)   Collection Time: 07/20/20  9:00 PM   Specimen: Urine, Clean Catch  Result Value Ref Range Status   Specimen Description   Final    URINE, CLEAN CATCH Performed at [facility], [place] [address], [place], [place] [address]    Special Requests   Final    NONE Performed at [facility], [place] [address], [place], [place] [address]    Culture (A)  Final    >=100,000 COLONIES/mL ESCHERICHIA COLI SUSCEPTIBILITIES TO FOLLOW Performed at [facility], [place] [address] [place], [place] [address]    Report Status PENDING  Incomplete  Urine Culture     Status: None   Collection Time: 07/20/20 10:55 PM   Specimen: Urine, Cystoscope  Result Value Ref Range Status   Specimen Description   Final    CYSTOSCOPY Performed at [facility] [address], [place], [place] [address]    Special Requests   Final    NONE Performed at [facility], [place] [address], [place], [place] [address]    Culture   Final    NO GROWTH Performed at [facility], [place] [address] [place], [place] [address]    Report Status 07/22/2020 FINAL  Final     Labs: BNP (last 3 results) No results for input(s): BNP in the last 8760 hours. Basic Metabolic Panel: Recent Labs  Lab 07/20/20 1736 07/21/20 0347 07/22/20 0336 07/23/20 0401  NA 135 139 141 142  K 4.1 4.9 4.6 4.4  CL 103 108 107 107  CO2 24 25 24 26   GLUCOSE 148* 159* 158* 100*  BUN 27* 26* 35* 34*  CREATININE 2.61* 2.36* 2.09* 2.01*  CALCIUM 9.0 8.8* 9.1 9.1  MG  --  2.4  --   --    Liver Function Tests: Recent Labs  Lab 07/20/20 1736 07/21/20 0347  AST 21 24  ALT 15 16  ALKPHOS 55 48  BILITOT 0.7 0.6  PROT 7.6 6.9  ALBUMIN 3.5 3.0*   No results for input(s): LIPASE, AMYLASE in the last 168 hours. No results for input(s): AMMONIA in the last 168 hours. CBC: Recent Labs  Lab  07/20/20 1736 07/21/20 0347 07/22/20 0336 07/23/20 0401  WBC 7.9 8.3 7.3 4.7  NEUTROABS 7.6  7.8* 6.3 3.6  HGB 10.1* 8.9* 9.0* 8.6*  HCT 32.2* 28.6* 28.4* 27.6*  MCV 104.5* 106.3* 103.6* 104.5*  PLT 95* 94* 82* 112*   Cardiac Enzymes: No results for input(s): CKTOTAL, CKMB, CKMBINDEX, TROPONINI in the last 168 hours. BNP: Invalid input(s): POCBNP CBG: No results for input(s): GLUCAP in the last 168 hours. D-Dimer No results for input(s): DDIMER in the last 72 hours. Hgb A1c No results for input(s): HGBA1C in the last 72 hours. Lipid Profile No results for input(s): CHOL, HDL, LDLCALC, TRIG, CHOLHDL, LDLDIRECT in the last 72 hours. Thyroid function studies No results for input(s): TSH, T4TOTAL, T3FREE, THYROIDAB in the last 72 hours.  Invalid input(s): FREET3 Anemia work up No results for input(s): VITAMINB12, FOLATE, FERRITIN, TIBC, IRON, RETICCTPCT in the last 72 hours. Urinalysis    Component Value Date/Time   COLORURINE YELLOW 07/20/2020 2100   APPEARANCEUR HAZY (A) 07/20/2020 2100   LABSPEC 1.023 07/20/2020 2100   PHURINE 5.0 07/20/2020 2100   GLUCOSEU NEGATIVE 07/20/2020 2100   HGBUR MODERATE (A) 07/20/2020 2100   BILIRUBINUR NEGATIVE 07/20/2020 2100   KETONESUR 5 (A) 07/20/2020 2100   PROTEINUR 100 (A) 07/20/2020 2100   UROBILINOGEN 0.2 09/24/2014 1336   NITRITE NEGATIVE 07/20/2020 2100   LEUKOCYTESUR LARGE (A) 07/20/2020 2100   Sepsis Labs Invalid input(s): PROCALCITONIN,  WBC,  LACTICIDVEN Microbiology Recent Results (from the past 240 hour(s))  Resp Panel by RT-PCR (Flu A&B, Covid) Nasopharyngeal Swab     Status: None   Collection Time: 07/20/20  5:36 PM   Specimen: Nasopharyngeal Swab; Nasopharyngeal(NP) swabs in vial transport medium  Result Value Ref Range Status   SARS Coronavirus 2 by RT PCR NEGATIVE NEGATIVE Final    Comment: (NOTE) SARS-CoV-2 target nucleic acids are NOT DETECTED.  The SARS-CoV-2 RNA is generally detectable in upper  respiratory specimens during the acute phase of infection. The lowest concentration of SARS-CoV-2 viral copies this assay can detect is 138 copies/mL. A negative result does not preclude SARS-Cov-2 infection and should not be used as the sole basis for treatment or other patient management decisions. A negative result may occur with  improper specimen collection/handling, submission of specimen other than nasopharyngeal swab, presence of viral mutation(s) within the areas targeted by this assay, and inadequate number of viral copies(<138 copies/mL). A negative result must be combined with clinical observations, patient history, and epidemiological information. The expected result is Negative.  Fact Sheet for Patients:  EntrepreneurPulse.com.au  Fact Sheet for Healthcare Providers:  IncredibleEmployment.be  This test is no t yet approved or cleared by the [place] FDA and  has been authorized for detection and/or diagnosis of SARS-CoV-2 by FDA under an Emergency Use Authorization (EUA). This EUA will remain  in effect (meaning this test can be used) for the duration of the COVID-19 declaration under Section 564(b)(1) of the Act, 21 U.S.C.section 360bbb-3(b)(1), unless the authorization is terminated  or revoked sooner.       Influenza A by PCR NEGATIVE NEGATIVE Final   Influenza B by PCR NEGATIVE NEGATIVE Final    Comment: (NOTE) The Xpert Xpress SARS-CoV-2/FLU/RSV plus assay is intended as an aid in the diagnosis of influenza from Nasopharyngeal swab specimens and should not be used as a sole basis for treatment. Nasal washings and aspirates are unacceptable for Xpert Xpress SARS-CoV-2/FLU/RSV testing.  Fact Sheet for Patients: EntrepreneurPulse.com.au  Fact Sheet for Healthcare Providers: IncredibleEmployment.be  This test is not yet approved or cleared by the [place] FDA and has been  authorized for detection and/or diagnosis of SARS-CoV-2 by FDA under an Emergency Use Authorization (EUA). This EUA will remain in effect (meaning this test can be used) for the duration of the COVID-19 declaration under Section 564(b)(1) of the Act, 21 U.S.C. section 360bbb-3(b)(1), unless the authorization is terminated or revoked.  Performed at [facility], [place] [address] [place], [place] [address]   Blood Culture (routine x 2)     Status: None (Preliminary result)   Collection Time: 07/20/20  5:36 PM   Specimen: BLOOD  Result Value Ref Range Status   Specimen Description   Final    BLOOD LEFT ANTECUBITAL Performed at [place] [address] [place], [place] [address]    Special Requests   Final    BOTTLES DRAWN AEROBIC AND ANAEROBIC Blood Culture adequate volume Performed at [place] [address], [place], [place] [address]    Culture   Final    NO GROWTH 1 DAY Performed at [facility], [place] [address] [place], [place] [address]    Report Status PENDING  Incomplete  Blood Culture (routine x 2)     Status: None (Preliminary result)   Collection Time: 07/20/20  5:41 PM   Specimen: BLOOD  Result Value Ref Range Status   Specimen Description   Final    BLOOD BLOOD RIGHT FOREARM Performed at [place] [address], [place], [place] [address]    Special Requests   Final    BOTTLES DRAWN AEROBIC AND ANAEROBIC Blood Culture adequate volume Performed at [place] [address] [place], [place] [address]    Culture   Final    NO GROWTH 1 DAY Performed at [facility], [place] [address], [place], [place] [address]    Report Status PENDING  Incomplete  Urine Culture     Status: Abnormal (Preliminary result)   Collection Time: 07/20/20  9:00 PM   Specimen: Urine, Clean Catch  Result Value Ref Range Status   Specimen Description   Final    URINE,  CLEAN CATCH Performed at [facility], [place] [address] [place], [place] [address]    Special Requests   Final    NONE Performed at [facility], [place] [address], [place], [place] [address]    Culture (A)  Final    >=100,000 COLONIES/mL ESCHERICHIA COLI SUSCEPTIBILITIES TO FOLLOW Performed at [facility], [place] [address] [place], [place] [address]    Report Status PENDING  Incomplete  Urine Culture     Status: None   Collection Time: 07/20/20 10:55 PM   Specimen: Urine, Cystoscope  Result Value Ref Range Status   Specimen Description   Final    CYSTOSCOPY Performed at [place] [address] [place], [place] [address]    Special Requests   Final    NONE Performed at [facility], [place] [address], [place], [place] [address]    Culture   Final    NO GROWTH Performed at [facility], [place] [address], [place], [place] [address]    Report Status 07/22/2020 FINAL  Final     Time coordinating discharge:  35 minutes  SIGNED:   Barb Merino, MD  [facility] 07/23/2020, 1:15 PM

## 2020-07-24 LAB — URINE CULTURE: Culture: >=100000 — AB

## 2020-07-26 LAB — CULTURE, BLOOD (ROUTINE X 2)
Culture: NO GROWTH
Culture: NO GROWTH
Special Requests: ADEQUATE
Special Requests: ADEQUATE

## 2020-08-06 ENCOUNTER — Other Ambulatory Visit: Payer: Self-pay | Admitting: Urology

## 2020-08-20 ENCOUNTER — Encounter (HOSPITAL_BASED_OUTPATIENT_CLINIC_OR_DEPARTMENT_OTHER): Payer: Self-pay | Admitting: Urology

## 2020-08-21 ENCOUNTER — Other Ambulatory Visit: Payer: Self-pay

## 2020-08-21 ENCOUNTER — Encounter (HOSPITAL_BASED_OUTPATIENT_CLINIC_OR_DEPARTMENT_OTHER): Payer: Self-pay | Admitting: Urology

## 2020-08-21 NOTE — Progress Notes (Signed)
Spoke w/ via phone for pre-op interview--- Pt's wife, [name] needs dos----   [employer] results------ current ekg in epic/ chart COVID test -----patient states asymptomatic no test needed Arrive at -------   0530 on 08-24-2020 NPO after MN NO Solid Food.  Clear liquids from MN until--- 0430 Med rec completed Medications to take morning of surgery ----- Atenolol,  eye drops as usual Diabetic medication ----- n/a Patient instructed no nail polish to be worn day of surgery Patient instructed to bring photo id and insurance card day of surgery Patient aware to have Driver (ride ) / caregiver for 24 hours after surgery --wife, [facility] Patient Special Instructions ----- n/a Pre-Op special Istructions ----- pt's wife stated pt would know his medication's when last taken, but may need her pre-op Patient verbalized understanding of instructions that were given at this phone interview. Patient denies shortness of breath, chest pain, fever, cough at this phone interview.

## 2020-08-24 ENCOUNTER — Ambulatory Visit (HOSPITAL_BASED_OUTPATIENT_CLINIC_OR_DEPARTMENT_OTHER)
Admission: RE | Admit: 2020-08-24 | Discharge: 2020-08-24 | Disposition: A | Discharge status: Home / Self Care | Payer: Medicare PPO | Attending: Urology | Admitting: Urology

## 2020-08-24 ENCOUNTER — Ambulatory Visit (HOSPITAL_BASED_OUTPATIENT_CLINIC_OR_DEPARTMENT_OTHER): Payer: Medicare PPO | Admitting: Anesthesiology

## 2020-08-24 ENCOUNTER — Encounter (HOSPITAL_BASED_OUTPATIENT_CLINIC_OR_DEPARTMENT_OTHER)
Admission: RE | Disposition: A | Discharge status: Home / Self Care | Payer: Self-pay | Source: Home / Self Care | Attending: Urology

## 2020-08-24 ENCOUNTER — Encounter (HOSPITAL_BASED_OUTPATIENT_CLINIC_OR_DEPARTMENT_OTHER): Payer: Self-pay | Admitting: Urology

## 2020-08-24 DIAGNOSIS — Z923 Personal history of irradiation: Secondary | ICD-10-CM | POA: Diagnosis not present

## 2020-08-24 DIAGNOSIS — Z888 Allergy status to other drugs, medicaments and biological substances status: Secondary | ICD-10-CM | POA: Insufficient documentation

## 2020-08-24 DIAGNOSIS — Z87442 Personal history of urinary calculi: Secondary | ICD-10-CM | POA: Diagnosis not present

## 2020-08-24 DIAGNOSIS — Z882 Allergy status to sulfonamides status: Secondary | ICD-10-CM | POA: Insufficient documentation

## 2020-08-24 DIAGNOSIS — N200 Calculus of kidney: Secondary | ICD-10-CM | POA: Diagnosis present

## 2020-08-24 DIAGNOSIS — Z9079 Acquired absence of other genital organ(s): Secondary | ICD-10-CM | POA: Diagnosis not present

## 2020-08-24 DIAGNOSIS — Z8546 Personal history of malignant neoplasm of prostate: Secondary | ICD-10-CM | POA: Diagnosis not present

## 2020-08-24 DIAGNOSIS — Z87440 Personal history of urinary (tract) infections: Secondary | ICD-10-CM | POA: Diagnosis not present

## 2020-08-24 DIAGNOSIS — Z87891 Personal history of nicotine dependence: Secondary | ICD-10-CM | POA: Insufficient documentation

## 2020-08-24 DIAGNOSIS — Z881 Allergy status to other antibiotic agents status: Secondary | ICD-10-CM | POA: Diagnosis not present

## 2020-08-24 HISTORY — DX: Legal blindness, as defined in USA: H54.8

## 2020-08-24 HISTORY — DX: Calculus of ureter: N20.1

## 2020-08-24 HISTORY — DX: Anemia in chronic kidney disease: D63.1

## 2020-08-24 HISTORY — DX: Personal history of colonic polyps: Z86.010

## 2020-08-24 HISTORY — DX: Presence of dental prosthetic device (complete) (partial): Z97.2

## 2020-08-24 HISTORY — DX: Other amnesia: R41.3

## 2020-08-24 HISTORY — PX: CYSTOSCOPY W/ URETERAL STENT REMOVAL: SHX1430

## 2020-08-24 HISTORY — DX: Unspecified glaucoma: H40.9

## 2020-08-24 HISTORY — DX: Personal history of adenomatous and serrated colon polyps: Z86.0101

## 2020-08-24 HISTORY — DX: Other constipation: K59.09

## 2020-08-24 HISTORY — DX: Gastro-esophageal reflux disease without esophagitis: K21.9

## 2020-08-24 HISTORY — DX: Chronic kidney disease, stage 3 unspecified: N18.30

## 2020-08-24 HISTORY — DX: Calculus of kidney: N20.0

## 2020-08-24 HISTORY — DX: Personal history of irradiation: Z92.3

## 2020-08-24 HISTORY — PX: CYSTOSCOPY WITH RETROGRADE PYELOGRAM, URETEROSCOPY AND STENT PLACEMENT: SHX5789

## 2020-08-24 HISTORY — DX: Personal history of other infectious and parasitic diseases: Z86.19

## 2020-08-24 HISTORY — DX: Hydrocele, unspecified: N43.3

## 2020-08-24 HISTORY — PX: HOLMIUM LASER APPLICATION: SHX5852

## 2020-08-24 HISTORY — DX: Unspecified urinary incontinence: R32

## 2020-08-24 LAB — POCT I-STAT, CHEM 8
BUN: 17 mg/dL (ref 8–23)
Calcium, Ion: 0.98 mmol/L — ABNORMAL LOW (ref 1.15–1.40)
Chloride: 113 mmol/L — ABNORMAL HIGH (ref 98–111)
Creatinine, Ser: 1.8 mg/dL — ABNORMAL HIGH (ref 0.61–1.24)
Glucose, Bld: 110 mg/dL — ABNORMAL HIGH (ref 70–99)
HCT: 33 % — ABNORMAL LOW (ref 39.0–52.0)
Hemoglobin: 11.2 g/dL — ABNORMAL LOW (ref 13.0–17.0)
Potassium: 4.8 mmol/L (ref 3.5–5.1)
Sodium: 139 mmol/L (ref 135–145)
TCO2: 21 mmol/L — ABNORMAL LOW (ref 22–32)

## 2020-08-24 SURGERY — CYSTOURETEROSCOPY, WITH RETROGRADE PYELOGRAM AND STENT INSERTION
Anesthesia: General | Site: Ureter | Laterality: Right

## 2020-08-24 MED ORDER — DEXAMETHASONE SODIUM PHOSPHATE 10 MG/ML IJ SOLN
INTRAMUSCULAR | Status: DC | PRN
  Administered 2020-08-24: 4 mg via INTRAVENOUS

## 2020-08-24 MED ORDER — FENTANYL CITRATE (PF) 100 MCG/2ML IJ SOLN
INTRAMUSCULAR | Status: AC
  Filled 2020-08-24: qty 2

## 2020-08-24 MED ORDER — IOHEXOL 300 MG/ML  SOLN
INTRAMUSCULAR | Status: DC | PRN
  Administered 2020-08-24: 10 mL

## 2020-08-24 MED ORDER — LIDOCAINE HCL (PF) 2 % IJ SOLN
INTRAMUSCULAR | Status: AC
  Filled 2020-08-24: qty 5

## 2020-08-24 MED ORDER — ACETAMINOPHEN 10 MG/ML IV SOLN: 1000.0000 mg | Freq: Once | INTRAVENOUS | Status: DC | PRN

## 2020-08-24 MED ORDER — CEFAZOLIN SODIUM 1 G IJ SOLR
INTRAMUSCULAR | Status: AC
  Filled 2020-08-24: qty 20

## 2020-08-24 MED ORDER — SODIUM CHLORIDE 0.9 % IR SOLN
Status: DC | PRN
  Administered 2020-08-24: 3000 mL

## 2020-08-24 MED ORDER — SODIUM CHLORIDE 0.9 % IV SOLN: INTRAVENOUS | Status: DC

## 2020-08-24 MED ORDER — ONDANSETRON HCL 4 MG/2ML IJ SOLN
INTRAMUSCULAR | Status: DC | PRN
  Administered 2020-08-24: 4 mg via INTRAVENOUS

## 2020-08-24 MED ORDER — PROPOFOL 10 MG/ML IV BOLUS
INTRAVENOUS | Status: AC
  Filled 2020-08-24: qty 20

## 2020-08-24 MED ORDER — ONDANSETRON HCL 4 MG/2ML IJ SOLN
INTRAMUSCULAR | Status: AC
  Filled 2020-08-24: qty 2

## 2020-08-24 MED ORDER — ACETAMINOPHEN 160 MG/5ML PO SOLN: 1000.0000 mg | Freq: Once | ORAL | Status: DC | PRN

## 2020-08-24 MED ORDER — FENTANYL CITRATE (PF) 100 MCG/2ML IJ SOLN
INTRAMUSCULAR | Status: DC | PRN
  Administered 2020-08-24: 50 ug via INTRAVENOUS
  Administered 2020-08-24 (×3): 25 ug via INTRAVENOUS

## 2020-08-24 MED ORDER — AMOXICILLIN-POT CLAVULANATE 875-125 MG PO TABS: 1.0000 | ORAL_TABLET | Freq: Two times a day (BID) | ORAL | 0 refills | Status: AC

## 2020-08-24 MED ORDER — PROPOFOL 10 MG/ML IV BOLUS
INTRAVENOUS | Status: DC | PRN
  Administered 2020-08-24: 120 mg via INTRAVENOUS

## 2020-08-24 MED ORDER — CEFAZOLIN SODIUM-DEXTROSE 2-3 GM-%(50ML) IV SOLR
INTRAVENOUS | Status: DC | PRN
  Administered 2020-08-24: 2 g via INTRAVENOUS

## 2020-08-24 MED ORDER — DEXAMETHASONE SODIUM PHOSPHATE 10 MG/ML IJ SOLN
INTRAMUSCULAR | Status: AC
  Filled 2020-08-24: qty 1

## 2020-08-24 MED ORDER — ACETAMINOPHEN 500 MG PO TABS: 1000.0000 mg | ORAL_TABLET | Freq: Once | ORAL | Status: DC | PRN

## 2020-08-24 MED ORDER — FENTANYL CITRATE (PF) 100 MCG/2ML IJ SOLN: 25.0000 ug | INTRAMUSCULAR | Status: DC | PRN

## 2020-08-24 MED ORDER — OXYCODONE HCL 5 MG PO TABS
ORAL_TABLET | ORAL | Status: AC
  Filled 2020-08-24: qty 1

## 2020-08-24 MED ORDER — OXYCODONE HCL 5 MG PO TABS
5.0000 mg | ORAL_TABLET | Freq: Once | ORAL | Status: AC | PRN
  Administered 2020-08-24: 5 mg via ORAL

## 2020-08-24 MED ORDER — EPHEDRINE SULFATE-NACL 50-0.9 MG/10ML-% IV SOSY
PREFILLED_SYRINGE | INTRAVENOUS | Status: DC | PRN
  Administered 2020-08-24: 10 mg via INTRAVENOUS

## 2020-08-24 MED ORDER — OXYCODONE HCL 5 MG/5ML PO SOLN: 5.0000 mg | Freq: Once | ORAL | Status: AC | PRN

## 2020-08-24 MED ORDER — 0.9 % SODIUM CHLORIDE (POUR BTL) OPTIME
TOPICAL | Status: DC | PRN
  Administered 2020-08-24: 500 mL

## 2020-08-24 SURGICAL SUPPLY — 26 items
BAG DRAIN URO-CYSTO SKYTR STRL (DRAIN) ×3 IMPLANT
BAG DRN UROCATH (DRAIN) ×2
BASKET ZERO TIP NITINOL 2.4FR (BASKET) IMPLANT
BSKT STON RTRVL ZERO TP 2.4FR (BASKET)
CATH INTERMIT  6FR 70CM (CATHETERS) ×3 IMPLANT
CLOTH BEACON ORANGE TIMEOUT ST (SAFETY) ×3 IMPLANT
COVER DOME SNAP 22 D (MISCELLANEOUS) ×3 IMPLANT
ELECT REM PT RETURN 9FT ADLT (ELECTROSURGICAL)
ELECTRODE REM PT RTRN 9FT ADLT (ELECTROSURGICAL) IMPLANT
FIBER LASER FLEXIVA 200 (UROLOGICAL SUPPLIES) IMPLANT
FIBER LASER FLEXIVA 365 (UROLOGICAL SUPPLIES) ×1 IMPLANT
GLOVE SURG ENC MOIS LTX SZ8 (GLOVE) ×3 IMPLANT
GOWN STRL REUS W/TWL LRG LVL3 (GOWN DISPOSABLE) ×3 IMPLANT
GUIDEWIRE ANG ZIPWIRE 038X150 (WIRE) ×1 IMPLANT
GUIDEWIRE STR DUAL SENSOR (WIRE) ×1 IMPLANT
IV NS IRRIG 3000ML ARTHROMATIC (IV SOLUTION) ×6 IMPLANT
KIT TURNOVER CYSTO (KITS) ×3 IMPLANT
MANIFOLD NEPTUNE II (INSTRUMENTS) ×3 IMPLANT
NS IRRIG 500ML POUR BTL (IV SOLUTION) ×3 IMPLANT
PACK CYSTO (CUSTOM PROCEDURE TRAY) ×3 IMPLANT
SHEATH URETERAL 12FRX35CM (MISCELLANEOUS) ×1 IMPLANT
STENT URET 6FRX26 CONTOUR (STENTS) ×1 IMPLANT
TRACTIP FLEXIVA PULS ID 200XHI (Laser) IMPLANT
TRACTIP FLEXIVA PULSE ID 200 (Laser)
TUBE CONNECTING 12X1/4 (SUCTIONS) IMPLANT
TUBING UROLOGY SET (TUBING) IMPLANT

## 2020-08-24 NOTE — Transfer of Care (Signed)
Immediate Anesthesia Transfer of Care Note  Patient: [name]  Procedure(s) Performed: Procedure(s) (LRB): CYSTOSCOPY WITH RETROGRADE PYELOGRAM, URETEROSCOPY, STONE EXTRACTION  AND STENT REPLACEMENT (Right) HOLMIUM LASER APPLICATION (Right) CYSTOSCOPY WITH STENT REMOVAL (Right)  Patient Location: PACU  Anesthesia Type: General  Level of Consciousness: awake, oriented, sedated and patient cooperative  Airway & Oxygen Therapy: Patient Spontanous Breathing and Patient connected to face mask oxygen  Post-op Assessment: Report given to PACU RN and Post -op Vital signs reviewed and stable  Post vital signs: Reviewed and stable  Complications: No apparent anesthesia complications  Last Vitals:  Vitals Value Taken Time  BP    Temp    Pulse 79 08/24/20 0856  Resp 18 08/24/20 0858  SpO2 99 % 08/24/20 0856  Vitals shown include unvalidated device data.  Last Pain:  Vitals:   08/24/20 0623  TempSrc: Oral  PainSc: 0-No pain      Patients Stated Pain Goal: 4 ([id number])  Complications: No notable events documented.

## 2020-08-24 NOTE — Op Note (Signed)
Preoperative diagnosis: Right renal calculi, large, status post stenting  Postoperative diagnosis: Same  Principal procedure: Cystoscopy, right double-J stent extraction/replacement, right ureteroscopy, holmium laser lithotripsy of renal calculi, fluoroscopic interpretation  Surgeon: Pranshu Lyster  Anesthesia: General with LMA  Complications: None  Specimen: None  Estimated blood loss: Less than 5 mL  Indications: 80 year old male with recurrent urolithiasis.  He has had quite a few prior interventions for bilateral renal calculi.  He presented on 18 July with urinary tract infection and an obstructing right UPJ stone.  This was approximately 12 to 14 mm in diameter.  There was a smaller renal pelvic stone.  He presents at this time, following adequate management of his previous urinary tract infection, for ureteroscopic management of these.  He is aware the procedure, risks, complications as well as expected outcome.  He desires to proceed.  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  Mildly erythematous urothelium throughout consistent with his prior radiation.  There were no urothelial lesions in the bladder.  The stent was properly positioned at the right orifice.  Retrograde study revealed a normal-appearing ureter.  No filling defects.  Pyelocalyceal system mildly dilated with filling defects in the pelvis and lower pole calyx.  Description of procedure: The patient was properly identified and marked in the holding area.  He received preoperative IV antibiotics.  Was taken to the operating room where general anesthetic was administered with the LMA.  Is placed in the dorsolithotomy position.  Genitalia and perineum were prepped, draped, proper timeout performed.  21 French panendoscope passed under direct vision into the bladder with the above-mentioned findings.  Stent was extracted and retrograde pyelogram performed using 6 French open-ended catheter and  Omnipaque with the above-mentioned findings.  Working and safety guidewire were placed through a 12/14 medium length ureteral access catheter.  Once replaced, the safety guidewire was removed.  The 6 French dual-lumen digital flexible ureteroscope was then passed into the pyelocalyceal system.  2 stones were seen, 1 in the pelvis and the larger one in the lower pole calyx.  Using a 5 m laser fiber, holmium laser energy was utilized to ablate the stone into multiple smaller granules/fragments, first the renal pelvic stone and then the larger lower calyceal stone.  Once all stones were fragmented/dusted, inspection revealed no significant larger fragments, all of the smaller fragments appeared like they would easily pass.  The ureteroscope was then removed.  The guidewire was backloaded through the cystoscope and using direct cystoscopic and fluoroscopic guidance, a 26 cm x 6 French contour double-J stent without tether was deployed in the ureter.  Excellent proximal distal curls were seen using fluoroscopy and cystoscopy, respectively.  The bladder was drained.  The scope removed.  This point the procedure was terminated.  The patient was awakened, taken to the PACU in stable condition having tolerated the procedure well.

## 2020-08-24 NOTE — Anesthesia Procedure Notes (Signed)
Procedure Name: LMA Insertion Date/Time: 08/24/2020 7:37 AM Performed by: Suan Halter, CRNA Pre-anesthesia Checklist: Patient identified, Emergency Drugs available, Suction available and Patient being monitored Patient Re-evaluated:Patient Re-evaluated prior to induction Oxygen Delivery Method: Circle system utilized Preoxygenation: Pre-oxygenation with 100% oxygen Induction Type: IV induction Ventilation: Mask ventilation without difficulty LMA: LMA inserted LMA Size: 5.0 Number of attempts: 1 Airway Equipment and Method: Bite block Placement Confirmation: positive ETCO2 Tube secured with: Tape Dental Injury: Teeth and Oropharynx as per pre-operative assessment

## 2020-08-24 NOTE — Anesthesia Postprocedure Evaluation (Signed)
Anesthesia Post Note  Patient: [name]  Procedure(s) Performed: CYSTOSCOPY WITH RETROGRADE PYELOGRAM, URETEROSCOPY, STONE EXTRACTION  AND STENT REPLACEMENT (Right: Ureter) HOLMIUM LASER APPLICATION (Right: Ureter) CYSTOSCOPY WITH STENT REMOVAL (Right: Bladder)     Patient location during evaluation: PACU Anesthesia Type: General Level of consciousness: awake and alert Pain management: pain level controlled Vital Signs Assessment: post-procedure vital signs reviewed and stable Respiratory status: spontaneous breathing, nonlabored ventilation, respiratory function stable and patient connected to nasal cannula oxygen Cardiovascular status: blood pressure returned to baseline and stable Postop Assessment: no apparent nausea or vomiting Anesthetic complications: no   No notable events documented.  Last Vitals:  Vitals:   08/24/20 0916 08/24/20 1030  BP: (!) 148/89 (!) 173/91  Pulse:    Resp: 16 16  Temp: 36.5 C (!) 36.3 C  SpO2:  100%    Last Pain:  Vitals:   08/24/20 1040  TempSrc:   PainSc: 7                  Aunesti Pellegrino

## 2020-08-24 NOTE — Anesthesia Preprocedure Evaluation (Addendum)
Anesthesia Evaluation  Patient identified by MRN, date of birth, ID band Patient awake    Reviewed: Allergy & Precautions, NPO status , Patient's Chart, lab work & pertinent test results  History of Anesthesia Complications Negative for: history of anesthetic complications  Airway Mallampati: I  TM Distance: >3 FB Neck ROM: Full    Dental  (+) Edentulous Lower, Edentulous Upper, Dental Advisory Given   Pulmonary neg pulmonary ROS, former smoker,  Covid-19 Nucleic Acid Test Results Lab Results      Component                Value               Date                      SARSCOV2NAA              NEGATIVE            07/20/2020                [place]              NEGATIVE            10/25/2019                [place]              Not Detected        01/29/2019              breath sounds clear to auscultation       Cardiovascular hypertension, Pt. on medications and Pt. on home beta blockers (-) angina(-) Past MI and (-) CHF  Rhythm:Regular  tte 2010:  - Left ventricle: The cavity size was normal. Wall thickness was   normal. Systolic function was normal. The estimated ejection   fraction was in the range of 55% to 60%. Regional wall motion   abnormalities cannot be excluded. Doppler parameters are   consistent with abnormal left ventricular relaxation (grade 1   diastolic dysfunction).  - Aortic valve: Trivial regurgitation.     Neuro/Psych PSYCHIATRIC DISORDERS Anxiety TIA   GI/Hepatic Neg liver ROS, GERD  ,  Endo/Other  negative endocrine ROSLab Results      Component                Value               Date                      HGBA1C                   5.7 (H)             04/10/2012             Renal/GU Renal InsufficiencyRenal diseaseLab Results      Component                Value               Date                      CREATININE               2.01 (H)            07/23/2020           Lab Results  Component                Value               Date                      K                        4.4                 07/23/2020                Musculoskeletal  (+) Arthritis ,   Abdominal   Peds negative pediatric ROS (+)  Hematology  (+) Blood dyscrasia, anemia , Lab Results      Component                Value               Date                      WBC                      4.7                 07/23/2020                HGB                      8.6 (L)             07/23/2020                HCT                      27.6 (L)            07/23/2020                MCV                      104.5 (H)           07/23/2020                PLT                      112 (L)             07/23/2020              Anesthesia Other Findings   Reproductive/Obstetrics                            Anesthesia Physical Anesthesia Plan  ASA: 2  Anesthesia Plan: General   Post-op Pain Management:    Induction: Intravenous  PONV Risk Score and Plan: 2 and Dexamethasone and Ondansetron  Airway Management Planned: LMA and Oral ETT  Additional Equipment: None  Intra-op Plan:   Post-operative Plan: Extubation in OR  Informed Consent: I have reviewed the patients History and Physical, chart, labs and discussed the procedure including the risks, benefits and alternatives for the proposed anesthesia with the patient or authorized representative who has indicated his/her understanding and acceptance.     Dental advisory given  Plan Discussed with: CRNA and Anesthesiologist  Anesthesia Plan Comments:  Anesthesia Quick Evaluation  

## 2020-08-24 NOTE — Interval H&P Note (Signed)
History and Physical Interval Note:  08/24/2020 7:24 AM  [name]  has presented today for surgery, with the diagnosis of RIGHT RENAL AND URETERAL CALCULI.  The various methods of treatment have been discussed with the patient and family. After consideration of risks, benefits and other options for treatment, the patient has consented to  Procedure(s) with comments: CYSTOSCOPY WITH RETROGRADE PYELOGRAM, URETEROSCOPY, STONE EXTRACTION  AND STENT REPLACEMENT (Right) - 2 HRS HOLMIUM LASER APPLICATION (Right) CYSTOSCOPY WITH STENT REMOVAL (Right) as a surgical intervention.  The patient's history has been reviewed, patient examined, no change in status, stable for surgery.  I have reviewed the patient's chart and labs.  Questions were answered to the patient's satisfaction.     Lillette Boxer Kendryck Lacroix

## 2020-08-24 NOTE — Discharge Instructions (Addendum)
You may see some blood in the urine and may have some burning with urination for 48-72 hours. You also may notice that you have to urinate more frequently or urgently after your procedure which is normal.  You should call should you develop an inability urinate, fever > 101, persistent nausea and vomiting that prevents you from eating or drinking to stay hydrated.  If you have a stent, you will likely urinate more frequently and urgently until the stent is removed and you may experience some discomfort/pain in the lower abdomen and flank especially when urinating. You may take pain medication prescribed to you if needed for pain. You may also intermittently have blood in the urine until the stent is removed.     Post Anesthesia Home Care Instructions  Activity: Get plenty of rest for the remainder of the day. A responsible individual must stay with you for 24 hours following the procedure.  For the next 24 hours, DO NOT: -Drive a car -Paediatric nurse -Drink alcoholic beverages -Take any medication unless instructed by your physician -Make any legal decisions or sign important papers.  Meals: Start with liquid foods such as gelatin or soup. Progress to regular foods as tolerated. Avoid greasy, spicy, heavy foods. If nausea and/or vomiting occur, drink only clear liquids until the nausea and/or vomiting subsides. Call your physician if vomiting continues.  Special Instructions/Symptoms: Your throat may feel dry or sore from the anesthesia or the breathing tube placed in your throat during surgery. If this causes discomfort, gargle with warm salt water. The discomfort should disappear within 24 hours.      [facility] [phone] Post Ureteroscopy With or Without Stent Instructions  Definitions:  Ureter: The duct that transports urine from the kidney to the bladder. Stent:   A plastic hollow tube that is placed into the ureter, from the kidney to the bladder to  prevent the ureter from swelling shut.  GENERAL INSTRUCTIONS:  Despite the fact that no skin incisions were used, the area around the ureter and bladder is raw and irritated. The stent is a foreign body which will further irritate the bladder wall. This irritation is manifested by increased frequency of urination, both day and night, and by an increase in the urge to urinate. In some, the urge to urinate is present almost always. Sometimes the urge is strong enough that you may not be able to stop yourself from urinating. The only real cure is to remove the stent and then give time for the bladder wall to heal which can't be done until the danger of the ureter swelling shut has passed, which varies.  You may see some blood in your urine while the stent is in place and a few days afterwards. Do not be alarmed, even if the urine was clear for a while. Get off your feet and drink lots of fluids until clearing occurs. If you start to pass clots or don't improve, call us.  DIET: You may return to your normal diet immediately. Because of the raw surface of your bladder, alcohol, spicy foods, acid type foods and drinks with caffeine may cause irritation or frequency and should be used in moderation. To keep your urine flowing freely and to avoid constipation, drink plenty of fluids during the day ( 8-10 glasses ). Tip: Avoid cranberry juice because it is very acidic.  ACTIVITY: Your physical activity doesn't need to be restricted. However, if you are very active, you may see some blood in your urine.  We suggest that you reduce your activity under these circumstances until the bleeding has stopped.  BOWELS: It is important to keep your bowels regular during the postoperative period. Straining with bowel movements can cause bleeding. A bowel movement every other day is reasonable. Use a mild laxative if needed, such as Milk of Magnesia 2-3 tablespoons, or 2 Dulcolax tablets. Call if you continue to have  problems. If you have been taking narcotics for pain, before, during or after your surgery, you may be constipated. Take a laxative if necessary.   MEDICATION: You should resume your pre-surgery medications unless told not to. In addition you will often be given an antibiotic to prevent infection. These should be taken as prescribed until the bottles are finished unless you are having an unusual reaction to one of the drugs.  PROBLEMS YOU SHOULD REPORT TO [place]: Fevers over 100.5 Fahrenheit. Heavy bleeding, or clots ( See above notes about blood in urine ). Inability to urinate. Drug reactions ( hives, rash, nausea, vomiting, diarrhea ). Severe burning or pain with urination that is not improving.  FOLLOW-UP: You will need a follow-up appointment to monitor your progress. Call for this appointment at the number listed above. Usually the first appointment will be about three to fourteen days after your surgery.

## 2020-08-24 NOTE — H&P (Signed)
H&P  Chief Complaint: Rt kidney stone  History of Present Illness: Pt w/ h/o  multiple stones/interventions presents for URS/HLL of 2 large  rt renal stones following urgent stenting 7.18.2022 for stones/hydro/associated sepsis. He has completed abx Rx.  Past Medical History:  Diagnosis Date   Anemia in chronic kidney disease (CKD)    Anxiety    Arthritis    Chronic back pain    Chronic constipation    CKD (chronic kidney disease), stage III (HCC)    Dyslipidemia    Eczema    Frequency of urination    GERD (gastroesophageal reflux disease)    Glaucoma, both eyes    Herniated disc    History of adenomatous polyp of colon    History of external beam radiation therapy    06-07-2012  to  08-02-2012;   prostate, 7800 cGy/40 sessions/ 5600cGy pelvic lymph nodes/40 sessions   History of kidney stones    History of prostate cancer 03/03/2012   urologist--- dr Eulogio Ditch;  dx 03/ 2014,  cT2b, Gleason 3+3;  completed IMRT 08-02-2012   History of sepsis    hx of several--- last one 0000000 due to complicated UTI secondary stone obstruction   History of TIA (transient ischemic attack) 12/2008   no residual   Hydrocele, bilateral    Hypertension    Impaired memory    Legally blind in right eye, 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   Renal calculus, right    Right ureteral calculus    Urinary incontinence    Weakness of both legs    per pt wife , pt's uses cane at times   Wears dentures    upper    Past Surgical History:  Procedure Laterality Date   BILIARY STENT PLACEMENT  06/15/2011   Procedure: BILIARY STENT PLACEMENT;  Surgeon: Franchot Gallo, MD;  [facility];  Service: Urology;  Laterality: Bilateral;   CYSTO/ BILATERAL RETROGRADE PYELOGRAM/ LEFT URETERSCOPIC STONE EXTRACTION  11/14/2005   LEFT URETERAL CALCULUS   CYSTO/ BILATERAL URETERAL STENTS  11/06/2005   '@[facility]    CYSTO/ LEFT RETROGRADE PYELOGRAM/  LEFT STENT PLACMENT  05/21/2011   '@[facility]     CYSTOSCOPY W/ URETERAL STENT PLACEMENT Right 07/20/2020   Procedure: CYSTOSCOPY WITH RETROGRADE PYELOGRAM/URETERAL STENT PLACEMENT;  Surgeon: Ceasar Mons, MD;  Location: [facility];  Service: Urology;  Laterality: Right;   CYSTOSCOPY W/ URETERAL STENT REMOVAL  06/15/2011   Procedure: CYSTOSCOPY WITH STENT REMOVAL;  Surgeon: Franchot Gallo, MD;  Location: [facility];  Service: Urology;  Laterality: Left;   EXTRACORPOREAL SHOCK WAVE LITHOTRIPSY     INTERNAL URETHROTOMY/ TURP  12/23/2002   BPH W/ STRICTURE   PERCUTANEOUS NEPHROLITHOTOMY Bilateral    left 01-13-2010 and 01-29-2010;  right 11-07-2005 , 11-14-2005, and 12-09-2005   '@[facility]    REPAIR LEFT INGUINAL HERNIA W/ MESH  12/23/2002   '@[facility]    RIGHT URETERAL STENT PLACEMENT  02/10/2003   '@[facility]    RIGHT URETEROSCOPIC STONE EXTRACTION  05/29/2003   '@[facility]    URETEROSCOPY  06/15/2011   Procedure: URETEROSCOPY;  Surgeon: Franchot Gallo, MD;  Location[facility];  Service: Urology;  Laterality: Bilateral;    Home Medications:    Allergies:  Allergies  Allergen Reactions   Tape Other (See Comments)    Redness and blisters/paper tape-not latex   Doxycycline Hyclate Other (See Comments)    Mental changes    Sulfa Antibiotics Hives    Family History  Problem Relation  Age of Onset   Hypertension Mother    Stroke Father    Hypertension Sister    Hypertension Brother     Social History:  reports that he quit smoking about 30 years ago. His smoking use included cigarettes. He has never used smokeless tobacco. He reports that he does not drink alcohol and does not use drugs.  ROS: A complete review of systems was performed.  All systems are negative except for pertinent findings as noted.  Physical Exam:  Vital signs in last 24 hours: Ht 6' (1.829 m)   Wt 83.5 kg   BMI 24.95 kg/m  Constitutional:  Alert and oriented, No acute distress Cardiovascular: Regular rate  Respiratory: Normal  respiratory effort GI: Abdomen is soft, nontender, nondistended, no abdominal masses. No CVAT.  Genitourinary: Normal male phallus, testes are descended bilaterally and non-tender and without masses, scrotum is normal in appearance without lesions or masses, perineum is normal on inspection. Lymphatic: No lymphadenopathy Neurologic: Grossly intact, no focal deficits Psychiatric: Normal mood and affect  Laboratory Data:  No results for input(s): WBC, HGB, HCT, PLT in the last 72 hours.  No results for input(s): NA, K, CL, GLUCOSE, BUN, CALCIUM, CREATININE in the last 72 hours.  Invalid input(s): CO3   No results found for this or any previous visit (from the past 24 hour(s)). No results found for this or any previous visit (from the past 240 hour(s)).  Renal Function: No results for input(s): CREATININE in the last 168 hours. CrCl cannot be calculated (Patient's most recent lab result is older than the maximum 21 days allowed.).  Radiologic Imaging: No results found.  Impression/Assessment:  Rt UPJ/renal calculi w/ sepsis--s/p urgent stenting/abx mgmt  Plan:  Cysto/Rt J2 stent extraction, Rt URS/HLL, stone extraction/ J2 stent

## 2020-08-25 ENCOUNTER — Encounter (HOSPITAL_BASED_OUTPATIENT_CLINIC_OR_DEPARTMENT_OTHER): Payer: Self-pay | Admitting: Urology

## 2020-11-18 ENCOUNTER — Inpatient Hospital Stay (HOSPITAL_COMMUNITY)
Admission: EM | Admit: 2020-11-18 | Discharge: 2020-11-21 | DRG: 872 | Disposition: A | Discharge status: Home / Self Care | Payer: Medicare PPO | Attending: Internal Medicine | Admitting: Internal Medicine

## 2020-11-18 DIAGNOSIS — F419 Anxiety disorder, unspecified: Secondary | ICD-10-CM | POA: Diagnosis present

## 2020-11-18 DIAGNOSIS — G8929 Other chronic pain: Secondary | ICD-10-CM | POA: Diagnosis present

## 2020-11-18 DIAGNOSIS — D696 Thrombocytopenia, unspecified: Secondary | ICD-10-CM | POA: Diagnosis present

## 2020-11-18 DIAGNOSIS — I10 Essential (primary) hypertension: Secondary | ICD-10-CM | POA: Diagnosis present

## 2020-11-18 DIAGNOSIS — Z20822 Contact with and (suspected) exposure to covid-19: Secondary | ICD-10-CM | POA: Diagnosis present

## 2020-11-18 DIAGNOSIS — B964 Proteus (mirabilis) (morganii) as the cause of diseases classified elsewhere: Secondary | ICD-10-CM | POA: Diagnosis present

## 2020-11-18 DIAGNOSIS — Z79899 Other long term (current) drug therapy: Secondary | ICD-10-CM

## 2020-11-18 DIAGNOSIS — M549 Dorsalgia, unspecified: Secondary | ICD-10-CM | POA: Diagnosis present

## 2020-11-18 DIAGNOSIS — Z86010 Personal history of colonic polyps: Secondary | ICD-10-CM

## 2020-11-18 DIAGNOSIS — D631 Anemia in chronic kidney disease: Secondary | ICD-10-CM | POA: Diagnosis present

## 2020-11-18 DIAGNOSIS — N189 Chronic kidney disease, unspecified: Secondary | ICD-10-CM | POA: Diagnosis present

## 2020-11-18 DIAGNOSIS — Z881 Allergy status to other antibiotic agents status: Secondary | ICD-10-CM

## 2020-11-18 DIAGNOSIS — R7989 Other specified abnormal findings of blood chemistry: Secondary | ICD-10-CM | POA: Diagnosis present

## 2020-11-18 DIAGNOSIS — R9431 Abnormal electrocardiogram [ECG] [EKG]: Secondary | ICD-10-CM | POA: Diagnosis present

## 2020-11-18 DIAGNOSIS — R32 Unspecified urinary incontinence: Secondary | ICD-10-CM | POA: Diagnosis present

## 2020-11-18 DIAGNOSIS — A419 Sepsis, unspecified organism: Principal | ICD-10-CM

## 2020-11-18 DIAGNOSIS — M5136 Other intervertebral disc degeneration, lumbar region with discogenic back pain only: Secondary | ICD-10-CM | POA: Diagnosis present

## 2020-11-18 DIAGNOSIS — K5909 Other constipation: Secondary | ICD-10-CM | POA: Diagnosis present

## 2020-11-18 DIAGNOSIS — A4159 Other Gram-negative sepsis: Principal | ICD-10-CM | POA: Diagnosis present

## 2020-11-18 DIAGNOSIS — Z9109 Other allergy status, other than to drugs and biological substances: Secondary | ICD-10-CM

## 2020-11-18 DIAGNOSIS — D539 Nutritional anemia, unspecified: Secondary | ICD-10-CM | POA: Diagnosis present

## 2020-11-18 DIAGNOSIS — Z923 Personal history of irradiation: Secondary | ICD-10-CM

## 2020-11-18 DIAGNOSIS — H548 Legal blindness, as defined in USA: Secondary | ICD-10-CM | POA: Diagnosis present

## 2020-11-18 DIAGNOSIS — N390 Urinary tract infection, site not specified: Secondary | ICD-10-CM | POA: Diagnosis present

## 2020-11-18 DIAGNOSIS — Z87442 Personal history of urinary calculi: Secondary | ICD-10-CM

## 2020-11-18 DIAGNOSIS — H409 Unspecified glaucoma: Secondary | ICD-10-CM | POA: Diagnosis present

## 2020-11-18 DIAGNOSIS — N1832 Chronic kidney disease, stage 3b: Secondary | ICD-10-CM | POA: Diagnosis present

## 2020-11-18 DIAGNOSIS — Z8619 Personal history of other infectious and parasitic diseases: Secondary | ICD-10-CM

## 2020-11-18 DIAGNOSIS — Z8546 Personal history of malignant neoplasm of prostate: Secondary | ICD-10-CM

## 2020-11-18 DIAGNOSIS — R079 Chest pain, unspecified: Secondary | ICD-10-CM | POA: Diagnosis present

## 2020-11-18 DIAGNOSIS — N200 Calculus of kidney: Secondary | ICD-10-CM | POA: Diagnosis present

## 2020-11-18 DIAGNOSIS — Z87891 Personal history of nicotine dependence: Secondary | ICD-10-CM

## 2020-11-18 DIAGNOSIS — N179 Acute kidney failure, unspecified: Secondary | ICD-10-CM | POA: Diagnosis present

## 2020-11-18 DIAGNOSIS — R300 Dysuria: Secondary | ICD-10-CM | POA: Diagnosis not present

## 2020-11-18 DIAGNOSIS — Z8249 Family history of ischemic heart disease and other diseases of the circulatory system: Secondary | ICD-10-CM

## 2020-11-18 DIAGNOSIS — Z882 Allergy status to sulfonamides status: Secondary | ICD-10-CM

## 2020-11-18 DIAGNOSIS — Z87440 Personal history of urinary (tract) infections: Secondary | ICD-10-CM

## 2020-11-18 DIAGNOSIS — R29898 Other symptoms and signs involving the musculoskeletal system: Secondary | ICD-10-CM | POA: Insufficient documentation

## 2020-11-18 DIAGNOSIS — I129 Hypertensive chronic kidney disease with stage 1 through stage 4 chronic kidney disease, or unspecified chronic kidney disease: Secondary | ICD-10-CM | POA: Diagnosis present

## 2020-11-18 DIAGNOSIS — K219 Gastro-esophageal reflux disease without esophagitis: Secondary | ICD-10-CM | POA: Diagnosis present

## 2020-11-18 DIAGNOSIS — R6520 Severe sepsis without septic shock: Secondary | ICD-10-CM | POA: Diagnosis present

## 2020-11-18 DIAGNOSIS — E785 Hyperlipidemia, unspecified: Secondary | ICD-10-CM | POA: Diagnosis present

## 2020-11-18 DIAGNOSIS — M1990 Unspecified osteoarthritis, unspecified site: Secondary | ICD-10-CM | POA: Diagnosis present

## 2020-11-18 DIAGNOSIS — Z8673 Personal history of transient ischemic attack (TIA), and cerebral infarction without residual deficits: Secondary | ICD-10-CM

## 2020-11-18 LAB — CBC WITH DIFFERENTIAL/PLATELET
Abs Immature Granulocytes: 0.07 10*3/uL (ref 0.00–0.07)
Basophils Absolute: 0 10*3/uL (ref 0.0–0.1)
Basophils Relative: 0 %
Eosinophils Absolute: 0 10*3/uL (ref 0.0–0.5)
Eosinophils Relative: 0 %
HCT: 38.2 % — ABNORMAL LOW (ref 39.0–52.0)
Hemoglobin: 12.1 g/dL — ABNORMAL LOW (ref 13.0–17.0)
Immature Granulocytes: 1 %
Lymphocytes Relative: 4 %
Lymphs Abs: 0.3 10*3/uL — ABNORMAL LOW (ref 0.7–4.0)
MCH: 32.4 pg (ref 26.0–34.0)
MCHC: 31.7 g/dL (ref 30.0–36.0)
MCV: 102.1 fL — ABNORMAL HIGH (ref 80.0–100.0)
Monocytes Absolute: 1 10*3/uL (ref 0.1–1.0)
Monocytes Relative: 11 %
Neutro Abs: 7.6 10*3/uL (ref 1.7–7.7)
Neutrophils Relative %: 84 %
Platelets: 81 10*3/uL — ABNORMAL LOW (ref 150–400)
RBC: 3.74 MIL/uL — ABNORMAL LOW (ref 4.22–5.81)
RDW: 12.5 % (ref 11.5–15.5)
WBC: 9 10*3/uL (ref 4.0–10.5)
nRBC: 0 % (ref 0.0–0.2)

## 2020-11-18 LAB — COMPREHENSIVE METABOLIC PANEL
ALT: 6 U/L (ref 0–44)
AST: 23 U/L (ref 15–41)
Albumin: 3.8 g/dL (ref 3.5–5.0)
Alkaline Phosphatase: 59 U/L (ref 38–126)
Anion gap: 11 (ref 5–15)
BUN: 19 mg/dL (ref 8–23)
CO2: 21 mmol/L — ABNORMAL LOW (ref 22–32)
Calcium: 9.3 mg/dL (ref 8.9–10.3)
Chloride: 104 mmol/L (ref 98–111)
Creatinine, Ser: 2.6 mg/dL — ABNORMAL HIGH (ref 0.61–1.24)
GFR, Estimated: 24 mL/min — ABNORMAL LOW (ref >60)
Glucose, Bld: 212 mg/dL — ABNORMAL HIGH (ref 70–99)
Potassium: 4 mmol/L (ref 3.5–5.1)
Sodium: 136 mmol/L (ref 135–145)
Total Bilirubin: 1.3 mg/dL — ABNORMAL HIGH (ref 0.3–1.2)
Total Protein: 7.3 g/dL (ref 6.5–8.1)

## 2020-11-18 LAB — LACTIC ACID, PLASMA: Lactic Acid, Venous: 2.7 mmol/L (ref 0.5–1.9)

## 2020-11-18 LAB — RESP PANEL BY RT-PCR (FLU A&B, COVID) ARPGX2
Influenza A by PCR: NEGATIVE
Influenza B by PCR: NEGATIVE
SARS Coronavirus 2 by RT PCR: NEGATIVE

## 2020-11-18 NOTE — ED Notes (Signed)
NA for vitals  °

## 2020-11-18 NOTE — ED Triage Notes (Signed)
Pt c/o UTI symptoms x1 day: incontinent, burning w urination, denies hematuria. Febrile at 104, given tylenol PTA.   124/76 HR 131

## 2020-11-18 NOTE — ED Provider Notes (Signed)
Emergency Medicine Provider Triage Evaluation Note  [name] , a 80 y.o. male  was evaluated in triage.  Pt complains of dysuria, frequency and incontinence for the past day.  Reports for years he has had urinary incontinence, wears depends.  Denies any hematuria or abdominal pain.  Has had fevers at home.  EMS reported a fever of 104.  Has been given Tylenol.    Review of Systems  Positive: Fevers, dysuria Negative: Hematuria, shortness of breath  Physical Exam  BP (!) 112/99 (BP Location: Right Arm)   Pulse (!) 125   Temp 99.3 F (37.4 C) (Oral)   Resp 18   SpO2 98%  Gen:   Awake, no distress   Resp:  Normal effort  MSK:   Moves extremities without difficulty  Other:    Medical Decision Making  Medically screening exam initiated at 7:43 PM.  Appropriate orders placed.  [name] was informed that the remainder of the evaluation will be completed by another provider, this initial triage assessment does not replace that evaluation, and the importance of remaining in the ED until their evaluation is complete.     Darliss Ridgel 11/18/20 2007    Carmin Muskrat, MD 11/18/20 2134

## 2020-11-19 ENCOUNTER — Emergency Department (HOSPITAL_COMMUNITY): Payer: Medicare PPO

## 2020-11-19 ENCOUNTER — Other Ambulatory Visit: Payer: Self-pay

## 2020-11-19 ENCOUNTER — Encounter (HOSPITAL_COMMUNITY): Payer: Self-pay | Admitting: Internal Medicine

## 2020-11-19 DIAGNOSIS — M5136 Other intervertebral disc degeneration, lumbar region: Secondary | ICD-10-CM

## 2020-11-19 DIAGNOSIS — Z8619 Personal history of other infectious and parasitic diseases: Secondary | ICD-10-CM | POA: Diagnosis not present

## 2020-11-19 DIAGNOSIS — Z8546 Personal history of malignant neoplasm of prostate: Secondary | ICD-10-CM

## 2020-11-19 DIAGNOSIS — Z20822 Contact with and (suspected) exposure to covid-19: Secondary | ICD-10-CM | POA: Diagnosis present

## 2020-11-19 DIAGNOSIS — M549 Dorsalgia, unspecified: Secondary | ICD-10-CM | POA: Diagnosis present

## 2020-11-19 DIAGNOSIS — I10 Essential (primary) hypertension: Secondary | ICD-10-CM | POA: Diagnosis not present

## 2020-11-19 DIAGNOSIS — R29898 Other symptoms and signs involving the musculoskeletal system: Secondary | ICD-10-CM | POA: Diagnosis not present

## 2020-11-19 DIAGNOSIS — R6520 Severe sepsis without septic shock: Secondary | ICD-10-CM | POA: Diagnosis present

## 2020-11-19 DIAGNOSIS — N390 Urinary tract infection, site not specified: Secondary | ICD-10-CM | POA: Diagnosis present

## 2020-11-19 DIAGNOSIS — D696 Thrombocytopenia, unspecified: Secondary | ICD-10-CM | POA: Diagnosis present

## 2020-11-19 DIAGNOSIS — G8929 Other chronic pain: Secondary | ICD-10-CM | POA: Diagnosis present

## 2020-11-19 DIAGNOSIS — B964 Proteus (mirabilis) (morganii) as the cause of diseases classified elsewhere: Secondary | ICD-10-CM | POA: Diagnosis present

## 2020-11-19 DIAGNOSIS — A419 Sepsis, unspecified organism: Secondary | ICD-10-CM | POA: Diagnosis not present

## 2020-11-19 DIAGNOSIS — E785 Hyperlipidemia, unspecified: Secondary | ICD-10-CM | POA: Diagnosis present

## 2020-11-19 DIAGNOSIS — Z8249 Family history of ischemic heart disease and other diseases of the circulatory system: Secondary | ICD-10-CM | POA: Diagnosis not present

## 2020-11-19 DIAGNOSIS — Z86010 Personal history of colonic polyps: Secondary | ICD-10-CM | POA: Diagnosis not present

## 2020-11-19 DIAGNOSIS — A4159 Other Gram-negative sepsis: Secondary | ICD-10-CM | POA: Diagnosis present

## 2020-11-19 DIAGNOSIS — Z87440 Personal history of urinary (tract) infections: Secondary | ICD-10-CM | POA: Diagnosis not present

## 2020-11-19 DIAGNOSIS — R300 Dysuria: Secondary | ICD-10-CM | POA: Diagnosis present

## 2020-11-19 DIAGNOSIS — R079 Chest pain, unspecified: Secondary | ICD-10-CM | POA: Diagnosis not present

## 2020-11-19 DIAGNOSIS — R7989 Other specified abnormal findings of blood chemistry: Secondary | ICD-10-CM | POA: Diagnosis present

## 2020-11-19 DIAGNOSIS — D539 Nutritional anemia, unspecified: Secondary | ICD-10-CM | POA: Diagnosis present

## 2020-11-19 DIAGNOSIS — N179 Acute kidney failure, unspecified: Secondary | ICD-10-CM | POA: Diagnosis present

## 2020-11-19 DIAGNOSIS — R32 Unspecified urinary incontinence: Secondary | ICD-10-CM | POA: Diagnosis present

## 2020-11-19 DIAGNOSIS — F419 Anxiety disorder, unspecified: Secondary | ICD-10-CM

## 2020-11-19 DIAGNOSIS — N189 Chronic kidney disease, unspecified: Secondary | ICD-10-CM

## 2020-11-19 DIAGNOSIS — I129 Hypertensive chronic kidney disease with stage 1 through stage 4 chronic kidney disease, or unspecified chronic kidney disease: Secondary | ICD-10-CM | POA: Diagnosis present

## 2020-11-19 DIAGNOSIS — H548 Legal blindness, as defined in USA: Secondary | ICD-10-CM | POA: Diagnosis present

## 2020-11-19 DIAGNOSIS — D631 Anemia in chronic kidney disease: Secondary | ICD-10-CM | POA: Diagnosis present

## 2020-11-19 DIAGNOSIS — N200 Calculus of kidney: Secondary | ICD-10-CM | POA: Diagnosis present

## 2020-11-19 DIAGNOSIS — N1832 Chronic kidney disease, stage 3b: Secondary | ICD-10-CM | POA: Diagnosis present

## 2020-11-19 LAB — COMPREHENSIVE METABOLIC PANEL
ALT: 12 U/L (ref 0–44)
AST: 18 U/L (ref 15–41)
Albumin: 3.1 g/dL — ABNORMAL LOW (ref 3.5–5.0)
Alkaline Phosphatase: 54 U/L (ref 38–126)
Anion gap: 7 (ref 5–15)
BUN: 21 mg/dL (ref 8–23)
CO2: 26 mmol/L (ref 22–32)
Calcium: 8.9 mg/dL (ref 8.9–10.3)
Chloride: 104 mmol/L (ref 98–111)
Creatinine, Ser: 2.57 mg/dL — ABNORMAL HIGH (ref 0.61–1.24)
GFR, Estimated: 25 mL/min — ABNORMAL LOW (ref >60)
Glucose, Bld: 126 mg/dL — ABNORMAL HIGH (ref 70–99)
Potassium: 4.8 mmol/L (ref 3.5–5.1)
Sodium: 137 mmol/L (ref 135–145)
Total Bilirubin: 0.9 mg/dL (ref 0.3–1.2)
Total Protein: 6.4 g/dL — ABNORMAL LOW (ref 6.5–8.1)

## 2020-11-19 LAB — CBC WITH DIFFERENTIAL/PLATELET
Abs Immature Granulocytes: 0.08 10*3/uL — ABNORMAL HIGH (ref 0.00–0.07)
Basophils Absolute: 0 10*3/uL (ref 0.0–0.1)
Basophils Relative: 0 %
Eosinophils Absolute: 0 10*3/uL (ref 0.0–0.5)
Eosinophils Relative: 0 %
HCT: 33 % — ABNORMAL LOW (ref 39.0–52.0)
Hemoglobin: 10.7 g/dL — ABNORMAL LOW (ref 13.0–17.0)
Immature Granulocytes: 1 %
Lymphocytes Relative: 6 %
Lymphs Abs: 0.4 10*3/uL — ABNORMAL LOW (ref 0.7–4.0)
MCH: 33.3 pg (ref 26.0–34.0)
MCHC: 32.4 g/dL (ref 30.0–36.0)
MCV: 102.8 fL — ABNORMAL HIGH (ref 80.0–100.0)
Monocytes Absolute: 1.5 10*3/uL — ABNORMAL HIGH (ref 0.1–1.0)
Monocytes Relative: 21 %
Neutro Abs: 5.4 10*3/uL (ref 1.7–7.7)
Neutrophils Relative %: 72 %
Platelets: 62 10*3/uL — ABNORMAL LOW (ref 150–400)
RBC: 3.21 MIL/uL — ABNORMAL LOW (ref 4.22–5.81)
RDW: 12.5 % (ref 11.5–15.5)
WBC: 7.4 10*3/uL (ref 4.0–10.5)
nRBC: 0 % (ref 0.0–0.2)

## 2020-11-19 LAB — URINALYSIS, ROUTINE W REFLEX MICROSCOPIC
Bilirubin Urine: NEGATIVE
Glucose, UA: NEGATIVE mg/dL
Ketones, ur: NEGATIVE mg/dL
Nitrite: NEGATIVE
Protein, ur: 100 mg/dL — AB
RBC / HPF: >50 RBC/hpf — ABNORMAL HIGH (ref 0–5)
Specific Gravity, Urine: 1.019 (ref 1.005–1.030)
WBC, UA: >50 WBC/hpf — ABNORMAL HIGH (ref 0–5)
pH: 5 (ref 5.0–8.0)

## 2020-11-19 LAB — TROPONIN I (HIGH SENSITIVITY)
Troponin I (High Sensitivity): 12 ng/L (ref <18)
Troponin I (High Sensitivity): 12 ng/L (ref <18)

## 2020-11-19 LAB — TSH: TSH: 1.269 u[IU]/mL (ref 0.350–4.500)

## 2020-11-19 LAB — D-DIMER, QUANTITATIVE: D-Dimer, Quant: 2.29 ug/mL-FEU — ABNORMAL HIGH (ref 0.00–0.50)

## 2020-11-19 LAB — TYPE AND SCREEN
ABO/RH(D): O POS
Antibody Screen: NEGATIVE

## 2020-11-19 LAB — HEMOGLOBIN AND HEMATOCRIT, BLOOD
HCT: 36 % — ABNORMAL LOW (ref 39.0–52.0)
Hemoglobin: 11.5 g/dL — ABNORMAL LOW (ref 13.0–17.0)

## 2020-11-19 LAB — LACTIC ACID, PLASMA: Lactic Acid, Venous: 1.9 mmol/L (ref 0.5–1.9)

## 2020-11-19 LAB — MAGNESIUM: Magnesium: 2.4 mg/dL (ref 1.7–2.4)

## 2020-11-19 MED ORDER — ACETAMINOPHEN 650 MG RE SUPP: 650.0000 mg | Freq: Four times a day (QID) | RECTAL | Status: DC | PRN

## 2020-11-19 MED ORDER — ATENOLOL 50 MG PO TABS
50.0000 mg | ORAL_TABLET | Freq: Every day | ORAL | Status: DC
  Administered 2020-11-19 – 2020-11-21 (×3): 50 mg via ORAL
  Filled 2020-11-19: qty 1
  Filled 2020-11-19: qty 2
  Filled 2020-11-19: qty 1

## 2020-11-19 MED ORDER — HYDROCODONE-ACETAMINOPHEN 10-325 MG PO TABS
1.0000 | ORAL_TABLET | Freq: Four times a day (QID) | ORAL | Status: DC | PRN
  Administered 2020-11-20 – 2020-11-21 (×3): 1 via ORAL
  Filled 2020-11-19 (×3): qty 1

## 2020-11-19 MED ORDER — LACTATED RINGERS IV BOLUS
1000.0000 mL | Freq: Once | INTRAVENOUS | Status: AC
  Administered 2020-11-19: 02:00:00 1000 mL via INTRAVENOUS

## 2020-11-19 MED ORDER — ALPRAZOLAM 0.5 MG PO TABS
0.5000 mg | ORAL_TABLET | Freq: Two times a day (BID) | ORAL | Status: DC | PRN
  Administered 2020-11-19 – 2020-11-21 (×3): 0.5 mg via ORAL
  Filled 2020-11-19: qty 1
  Filled 2020-11-19: qty 2
  Filled 2020-11-19: qty 1

## 2020-11-19 MED ORDER — LACTATED RINGERS IV BOLUS
1000.0000 mL | Freq: Once | INTRAVENOUS | Status: AC
  Administered 2020-11-19: 05:00:00 1000 mL via INTRAVENOUS

## 2020-11-19 MED ORDER — ACETAMINOPHEN 325 MG PO TABS
650.0000 mg | ORAL_TABLET | Freq: Four times a day (QID) | ORAL | Status: DC | PRN
  Administered 2020-11-19 – 2020-11-21 (×3): 650 mg via ORAL
  Filled 2020-11-19 (×3): qty 2

## 2020-11-19 MED ORDER — POLYVINYL ALCOHOL 1.4 % OP SOLN
1.0000 [drp] | OPHTHALMIC | Status: DC | PRN
  Filled 2020-11-19 (×2): qty 15

## 2020-11-19 MED ORDER — SODIUM CHLORIDE 0.9 % IV SOLN
1.0000 g | Freq: Two times a day (BID) | INTRAVENOUS | Status: DC
Start: 2020-11-19 — End: 2020-11-20
  Administered 2020-11-19 – 2020-11-20 (×4): 1 g via INTRAVENOUS
  Filled 2020-11-19 (×5): qty 1

## 2020-11-19 MED ORDER — ONDANSETRON HCL 4 MG/2ML IJ SOLN: 4.0000 mg | Freq: Four times a day (QID) | INTRAMUSCULAR | Status: DC | PRN

## 2020-11-19 MED ORDER — LACTATED RINGERS IV SOLN: INTRAVENOUS | Status: DC

## 2020-11-19 NOTE — H&P (Signed)
History and Physical    [name] ZJQ:[id number] DOB: 04/12/1940 DOA: 11/18/2020  Referring MD/NP/PA: Babs Bertin, DO PCP: Milford Cage, PA  Patient coming from: Home via EMS  Chief Complaint: Pain with urination  I have personally briefly reviewed patient's old medical records in [place]   HPI: [name] is a 80 y.o. 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  At baseline patient ambulates without need of assistance, and wears depends at night.  However, over the last couple days he has been having difficulty getting up and going to the bathroom due to complaints of weakness.  He had noted that he was shuffling his feet, but denies having any falls.  Complained of having burning pain with urination and lower abdomen pain.  Sometimes he will feel like he had to have a bowel movement, but could not when he tried and it caused him to have a pressure-like pain at the head of his penis.  Patient notes that he has been more sedentary and over the last couple months has had poor appetite for which he suspects is lost 7-8 pounds.  He has noticed that whenever he coughs, sneezes, or steps down to get out of bed in the morning he has incontinence of urine.  Patient chronically has issues with lower back pain and is on pain medication.  Due to pain medications he is on stool softeners decubitus bowels regular, but had possibly over done them 2 days ago as he had episodes of diarrhea.  Denied having any cough, nausea, vomiting, blood in stool, leg swelling, calf pain, or recent sick contacts to his knowledge.  Admitted in July for sepsis secondary to complicated UTI with infected obstructed renal stone with E. coli and ESBL treated with  meropenem.  During hospitalization patient had stent placed by urology.  Patient was admitted back into the hospital for cystoscopy in August for retraction of the stone.  His wife notes that since those admissions patient had had a steady decline.  EMS reported to be febrile up to 104 F given Tylenol prior to arrival.  While waiting to be seen in the emergency department patient reported that he had an episode of severe substernal chest pain with shortness of breath that lasted approximately 10 minutes prior to self resolving.  ED Course: On admission to the emergency department patient was noted to have temperature of 100 F, pulse 85-125, respirations 14-29, blood pressure 110/92-144/87, and O2 saturations maintained on room air.  Labs since 11/16 significant for hemoglobin 12.1-> 10.7 with elevated MCV, BUN 19-> 21, creatinine 2.6-> 2.57, high-sensitivity troponins negative x2, and lactic acid 2.7-> 1.9.  Chest x-ray noted no acute abnormality.  Urinalysis was positive for large hemoglobin, large leukocytes, rare bacteria, greater than 50 RBCs/hpf, and 50 WBCs.  CT renal stone study noted near complete resolution of previously seen right renal and ureteral stones following intervention with small nonobstructing stones noted at this time.  Blood and urine cultures have been obtained.  Patient had been given 2 L of lactated Ringer's, and started on empiric antibiotics of cefepime.   Review of Systems  Constitutional:  Positive for fever, malaise/fatigue and weight loss.  Eyes:  Negative for photophobia and pain.       Positive for blind in left eye  Respiratory:  Positive for shortness of breath. Negative for cough.   Cardiovascular:  Positive for chest pain. Negative for leg swelling.  Gastrointestinal:  Positive for abdominal pain and diarrhea. Negative for blood in stool, nausea and vomiting.  Genitourinary:  Positive for dysuria. Negative for hematuria.       Positive for urinary incontinence   Musculoskeletal:  Positive for back pain. Negative for falls.  Skin:  Negative for rash.  Neurological:  Positive for weakness. Negative for loss of consciousness.  Psychiatric/Behavioral:  Negative for substance abuse.    Past Medical History:  Diagnosis Date   Anemia in chronic kidney disease (CKD)    Anxiety    Arthritis    Chronic back pain    Chronic constipation    CKD (chronic kidney disease), stage III (HCC)    Dyslipidemia    Eczema    Frequency of urination    GERD (gastroesophageal reflux disease)    Glaucoma, both eyes    Herniated disc    History of adenomatous polyp of colon    History of external beam radiation therapy    06-07-2012  to  08-02-2012;   prostate, 7800 cGy/40 sessions/ 5600cGy pelvic lymph nodes/40 sessions   History of kidney stones    History of prostate cancer 03/03/2012   urologist--- dr Eulogio Ditch;  dx 03/ 2014,  cT2b, Gleason 3+3;  completed IMRT 08-02-2012   History of sepsis    hx of several--- last one [id number] due to complicated UTI secondary stone obstruction   History of TIA (transient ischemic attack) 12/2008   no residual   Hydrocele, bilateral    Hypertension    Impaired memory    Legally blind in right eye, 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   Renal calculus, right    Right ureteral calculus    Urinary incontinence    Weakness of both legs    per pt wife , pt's uses cane at times   Wears dentures    upper    Past Surgical History:  Procedure Laterality Date   BILIARY STENT PLACEMENT  06/15/2011   Procedure: BILIARY STENT PLACEMENT;  Surgeon: Franchot Gallo, MD;  Location: [facility];  Service: Urology;  Laterality: Bilateral;   CYSTO/ BILATERAL RETROGRADE PYELOGRAM/ LEFT URETERSCOPIC STONE EXTRACTION  11/14/2005   LEFT URETERAL CALCULUS   CYSTO/ BILATERAL URETERAL STENTS  11/06/2005   @[facility]    CYSTO/ LEFT RETROGRADE PYELOGRAM/  LEFT STENT PLACMENT  05/21/2011   @[facility]    CYSTOSCOPY W/ URETERAL STENT  PLACEMENT Right 07/20/2020   Procedure: CYSTOSCOPY WITH RETROGRADE PYELOGRAM/URETERAL STENT PLACEMENT;  Surgeon: Ceasar Mons, MD;  Location: [facility];  Service: Urology;  Laterality: Right;   CYSTOSCOPY W/ URETERAL STENT REMOVAL  06/15/2011   Procedure: CYSTOSCOPY WITH STENT REMOVAL;  Surgeon: Franchot Gallo, MD;  Location[facility];  Service: Urology;  Laterality: Left;   CYSTOSCOPY W/ URETERAL STENT REMOVAL Right 08/24/2020   Procedure: CYSTOSCOPY WITH STENT REMOVAL;  Surgeon: Franchot Gallo, MD;  Location: [facility];  Service: Urology;  Laterality: Right;   CYSTOSCOPY WITH RETROGRADE PYELOGRAM, URETEROSCOPY AND STENT PLACEMENT Right 08/24/2020   Procedure: CYSTOSCOPY WITH RETROGRADE PYELOGRAM, URETEROSCOPY, STONE EXTRACTION  AND STENT REPLACEMENT;  Surgeon: Franchot Gallo, MD;  Location: [facility];  Service: Urology;  Laterality: Right;  2 HRS   EXTRACORPOREAL SHOCK WAVE LITHOTRIPSY     HOLMIUM LASER APPLICATION Right 8/93/8101   Procedure: HOLMIUM LASER APPLICATION;  Surgeon:  Franchot Gallo, MD;  Location: [facility];  Service: Urology;  Laterality: Right;   INTERNAL URETHROTOMY/ TURP  12/23/2002   BPH W/ STRICTURE   PERCUTANEOUS NEPHROLITHOTOMY Bilateral    left 01-13-2010 and 01-29-2010;  right 11-07-2005 , 11-14-2005, and 12-09-2005   @[facility]    REPAIR LEFT INGUINAL HERNIA W/ MESH  12/23/2002   @[facility]    RIGHT URETERAL STENT PLACEMENT  02/10/2003   @[facility]    RIGHT URETEROSCOPIC STONE EXTRACTION  05/29/2003   @[facility]    URETEROSCOPY  06/15/2011   Procedure: URETEROSCOPY;  Surgeon: Franchot Gallo, MD;  Location: [facility];  Service: Urology;  Laterality: Bilateral;     reports that he quit smoking about 30 years ago. His smoking use included cigarettes. He has never used smokeless tobacco. He reports that he does not drink alcohol and does not use drugs.  Allergies  Allergen Reactions    Tape Other (See Comments)    Redness and blisters/paper tape-not latex   Doxycycline Hyclate Other (See Comments)    Mental changes    Sulfa Antibiotics Hives    Family History  Problem Relation Age of Onset   Hypertension Mother    Stroke Father    Hypertension Sister    Hypertension Brother     Prior to Admission medications   Medication Sig Start Date End Date Taking? Authorizing Provider  acetaminophen (TYLENOL) 325 MG tablet Take 2 tablets (650 mg total) by mouth every 6 (six) hours as needed for mild pain (or Fever >/= 101). 11/03/19   Nita Sells, MD  ALPHAGAN P 0.1 % SOLN Place 1 drop into the right eye every 8 (eight) hours. 09/25/19   [provider]  ALPRAZolam Duanne Moron) 0.5 MG tablet Take 0.5 mg by mouth 2 (two) times daily as needed for anxiety or sleep. 10/01/19   [provider]  amLODipine (NORVASC) 10 MG tablet Take 1 tablet (10 mg total) by mouth daily. Patient taking differently: Take 10 mg by mouth at bedtime. 03/16/17   Jaynee Eagles, PA-C  atenolol (TENORMIN) 50 MG tablet Take 1 tablet (50 mg total) by mouth daily. Pt needs appointment for further refills 10/22/17   Forrest Moron, MD  Calcium Carbonate-Simethicone (MAALOX MAX PO) Take by mouth as needed.    [provider]  HYDROcodone-acetaminophen (NORCO) 10-325 MG tablet Take 1 tablet by mouth 4 (four) times daily as needed for moderate pain or severe pain.  02/12/14   [provider]  Magnesium Hydroxide (DULCOLAX SOFT CHEWS PO) Take by mouth at bedtime.    [provider]  polyethylene glycol (MIRALAX / GLYCOLAX) 17 g packet Take 17 g by mouth daily as needed for mild constipation (For mild constipation not relieved by Senokot). Patient not taking: No sig reported 11/03/19   Nita Sells, MD  Potassium Citrate 15 MEQ (1620 MG) TBCR Take 15 mEq by mouth 3 (three) times daily. 03/05/20   [provider]  ROCKLATAN 0.02-0.005 % SOLN Place 1 drop  into both eyes at bedtime. 09/24/19   [provider]  timolol (TIMOPTIC) 0.5 % ophthalmic solution Place 1 drop into both eyes daily. 01/11/17   [provider]    Physical Exam:  Constitutional: Elderly male who appears to be in no acute distress Vitals:   11/19/20 0415 11/19/20 0500 11/19/20 0545 11/19/20 0630  BP: 132/78 128/78 (!) 144/87 139/78  Pulse: 91 85 98 (!) 118  Resp: (!) 22 14 (!) 22 (!) 29  Temp:    100  F (37.8 C)  TempSrc:    Oral  SpO2: 100% 97% 98% 98%   Eyes: PERRL, lids and conjunctivae normal ENMT: Mucous membranes are moist. Posterior pharynx clear of any exudate or lesions.  Neck: normal, supple, no masses, no thyromegaly Respiratory: clear to auscultation bilaterally, no wheezing, no crackles. Normal respiratory effort. No accessory muscle use.  Cardiovascular: Tachycardic with soft 2/6 systolic murmur appreciated. No extremity edema. 2+ pedal pulses. No carotid bruits.  Abdomen: no tenderness, no masses palpated.  Diarrhea bowel sounds positive.  Musculoskeletal: no clubbing / cyanosis. No joint deformity upper and lower extremities. Good ROM, no contractures. Normal muscle tone.  Skin: no rashes, lesions, ulcers. No induration Neurologic: CN 2-12 grossly intact. 4+/5 in both lower extremities and 5/5 in upper extremities Psychiatric: Normal judgment and insight. Alert and oriented x 3. Normal mood.     Labs on Admission: I have personally reviewed following labs and imaging studies  CBC: Recent Labs  Lab 11/18/20 2032 11/19/20 0415  WBC 9.0 7.4  NEUTROABS 7.6 5.4  HGB 12.1* 10.7*  HCT 38.2* 33.0*  MCV 102.1* 102.8*  PLT 81* 62*   Basic Metabolic Panel: Recent Labs  Lab 11/18/20 2032 11/19/20 0415  NA 136 137  K 4.0 4.8  CL 104 104  CO2 21* 26  GLUCOSE 212* 126*  BUN 19 21  CREATININE 2.60* 2.57*  CALCIUM 9.3 8.9  MG  --  2.4   GFR: CrCl cannot be calculated (Unknown ideal weight.). Liver Function Tests: Recent  Labs  Lab 11/18/20 2032 11/19/20 0415  AST 23 18  ALT 6 12  ALKPHOS 59 54  BILITOT 1.3* 0.9  PROT 7.3 6.4*  ALBUMIN 3.8 3.1*   No results for input(s): LIPASE, AMYLASE in the last 168 hours. No results for input(s): AMMONIA in the last 168 hours. Coagulation Profile: No results for input(s): INR, PROTIME in the last 168 hours. Cardiac Enzymes: No results for input(s): CKTOTAL, CKMB, CKMBINDEX, TROPONINI in the last 168 hours. BNP (last 3 results) No results for input(s): PROBNP in the last 8760 hours. HbA1C: No results for input(s): HGBA1C in the last 72 hours. CBG: No results for input(s): GLUCAP in the last 168 hours. Lipid Profile: No results for input(s): CHOL, HDL, LDLCALC, TRIG, CHOLHDL, LDLDIRECT in the last 72 hours. Thyroid Function Tests: No results for input(s): TSH, T4TOTAL, FREET4, T3FREE, THYROIDAB in the last 72 hours. Anemia Panel: No results for input(s): VITAMINB12, FOLATE, FERRITIN, TIBC, IRON, RETICCTPCT in the last 72 hours. Urine analysis:    Component Value Date/Time   COLORURINE YELLOW 11/19/2020 0130   APPEARANCEUR CLOUDY (A) 11/19/2020 0130   LABSPEC 1.019 11/19/2020 0130   PHURINE 5.0 11/19/2020 0130   GLUCOSEU NEGATIVE 11/19/2020 0130   HGBUR LARGE (A) 11/19/2020 0130   BILIRUBINUR NEGATIVE 11/19/2020 0130   KETONESUR NEGATIVE 11/19/2020 0130   PROTEINUR 100 (A) 11/19/2020 0130   UROBILINOGEN 0.2 09/24/2014 1336   NITRITE NEGATIVE 11/19/2020 0130   LEUKOCYTESUR LARGE (A) 11/19/2020 0130   Sepsis Labs: Recent Results (from the past 240 hour(s))  Resp Panel by RT-PCR (Flu A&B, Covid) Nasopharyngeal Swab     Status: None   Collection Time: 11/18/20  7:44 PM   Specimen: Nasopharyngeal Swab; Nasopharyngeal(NP) swabs in vial transport medium  Result Value Ref Range Status   SARS Coronavirus 2 by RT PCR NEGATIVE NEGATIVE Final    Comment: (NOTE) SARS-CoV-2 target nucleic acids are NOT DETECTED.  The SARS-CoV-2 RNA is generally detectable in  upper respiratory  specimens during the acute phase of infection. The lowest concentration of SARS-CoV-2 viral copies this assay can detect is 138 copies/mL. A negative result does not preclude SARS-Cov-2 infection and should not be used as the sole basis for treatment or other patient management decisions. A negative result may occur with  improper specimen collection/handling, submission of specimen other than nasopharyngeal swab, presence of viral mutation(s) within the areas targeted by this assay, and inadequate number of viral copies(<138 copies/mL). A negative result must be combined with clinical observations, patient history, and epidemiological information. The expected result is Negative.  Fact Sheet for Patients:  EntrepreneurPulse.com.au  Fact Sheet for Healthcare Providers:  IncredibleEmployment.be  This test is no t yet approved or cleared by the [place] FDA and  has been authorized for detection and/or diagnosis of SARS-CoV-2 by FDA under an Emergency Use Authorization (EUA). This EUA will remain  in effect (meaning this test can be used) for the duration of the COVID-19 declaration under Section 564(b)(1) of the Act, 21 U.S.C.section 360bbb-3(b)(1), unless the authorization is terminated  or revoked sooner.       Influenza A by PCR NEGATIVE NEGATIVE Final   Influenza B by PCR NEGATIVE NEGATIVE Final    Comment: (NOTE) The Xpert Xpress SARS-CoV-2/FLU/RSV plus assay is intended as an aid in the diagnosis of influenza from Nasopharyngeal swab specimens and should not be used as a sole basis for treatment. Nasal washings and aspirates are unacceptable for Xpert Xpress SARS-CoV-2/FLU/RSV testing.  Fact Sheet for Patients: EntrepreneurPulse.com.au  Fact Sheet for Healthcare Providers: IncredibleEmployment.be  This test is not yet approved or cleared by the [place] FDA and has been  authorized for detection and/or diagnosis of SARS-CoV-2 by FDA under an Emergency Use Authorization (EUA). This EUA will remain in effect (meaning this test can be used) for the duration of the COVID-19 declaration under Section 564(b)(1) of the Act, 21 U.S.C. section 360bbb-3(b)(1), unless the authorization is terminated or revoked.  Performed at [facility], [place] [address], [place], [place] [address]      Radiological Exams on Admission: DG Chest Portable 1 View  Result Date: 11/19/2020 CLINICAL DATA:  Fevers and possible sepsis, initial encounter EXAM: PORTABLE CHEST 1 VIEW COMPARISON:  07/20/2020 FINDINGS: Cardiac shadow is within normal limits. The lungs are hypoinflated. Patchy atelectatic changes are again noted in the left base stable from the prior study. No new focal abnormality is noted. IMPRESSION: Stable left basilar atelectasis/scarring. No acute abnormality noted. Electronically Signed   By: Inez Catalina M.D.   On: 11/19/2020 02:35   CT Renal Stone Study  Result Date: 11/19/2020 CLINICAL DATA:  Dysuria and urinary incontinence, initial encounter EXAM: CT ABDOMEN AND PELVIS WITHOUT CONTRAST TECHNIQUE: Multidetector CT imaging of the abdomen and pelvis was performed following the standard protocol without IV contrast. COMPARISON:  07/30/2020 FINDINGS: Lower chest: Lung bases are free of acute infiltrate or sizable effusion. Mild scarring is seen. Hepatobiliary: Scattered hypodensities are noted throughout the liver most consistent with cysts. These were better appreciated on the prior exam. Gallbladder is within normal limits. Pancreas: Unremarkable. No pancreatic ductal dilatation or surrounding inflammatory changes. Spleen: Normal in size without focal abnormality. Adrenals/Urinary Tract: Adrenal glands are within normal limits. Right kidney is somewhat shrunken stable from prior exam. Previously seen calculi on the right are significantly reduced consistent with interval  intervention. Small nonobstructing lower pole stone remains. Bladder is partially distended. No obstructive changes are noted on the left. Small nonobstructing stone is  noted in the lower pole of the left kidney new from the prior study. Stomach/Bowel: Scattered diverticular change of the colon is noted. No findings to suggest diverticulitis are seen. Mild retained fecal material is noted without evidence of constipation or obstruction. The appendix is within normal limits. Small bowel and stomach are unremarkable. Vascular/Lymphatic: Aortic atherosclerosis. No enlarged abdominal or pelvic lymph nodes. Reproductive: Surgical clip is again noted in the prostate bed. Other: Small fat containing umbilical hernia is noted. No ascites is seen. Musculoskeletal: Degenerative changes of the lumbar spine are noted. IMPRESSION: Near complete resolution of previously seen right renal and ureteral stones following intervention. A small nonobstructing stone is noted in the lower pole measuring 2 mm. Nonobstructing lower pole left renal stone measuring 2 mm. Diverticulosis without diverticulitis. Electronically Signed   By: Inez Catalina M.D.   On: 11/19/2020 03:40    EKG: Independently reviewed.  Sinus rhythm at 98 bpm with QTC 615  Assessment/Plan Sepsis secondary to UTI: He presents with complaints of dysuria an urinary incontinence.  With EMS patient was noted to be febrile up to 104 F with tachycardia and tachypnea meeting SIRS criteria.  Urinalysis positive for large hemoglobin, large leukocytes, rare bacteria, greater than 50 WBCs, and greater than 50 RBCs/hpf.  Lactic acid was initially elevated to 2.7, but repeat within normal limits after IV fluid bolus.  Patient was started on empiric antibiotics of meropenem given prior history of ESBL. -Admit to a telemetry bed -Follow-up blood and urine cultures -Continue meropenem for pharmacy -De-escalate antibiotics when and if medically appropriate -Tylenol as needed  for fever   Acute kidney injury superimposed on chronic kidney disease: Patient presents with creatinine elevated up to 2.6 with BUN 19.  Baseline creatinine previously noted be around 2.  He has a previous history of urinary retention. -Monitor intake and output -Bladder scan every 8 hours.  Orders placed to notify MD if greater than 350 mL of urine postvoid -Continue IV fluids at 100 mL/h as tolerated  Chest pain and shortness of breath  tachycardia: Acute.  While waiting to be seen in the emergency department patient reported episode of chest pain with complaints of shortness of breath lasted approximately 10 minutes prior to self resolving.  At the present patient is tachycardic with heart rates into the 120s.  Patient medications include atenolol which may be the cause of tachycardia, but given patient reports of being sedentary at home question possibility of a PE. -Check D-dimer and TSH -May warrant VQ scan if D-dimer noted to be elevated given current kidney function  Prolonged QT interval: Acute.  QTC initially noted to be 615 on admission.  Potassium and magnesium appear to be in the limits.  QTC was not prolonged on previous EKGs from earlier this year in 2021. -Correct electrolyte abnormalities -Avoid QT prolonging medications -Continue to monitor  Macrocytic anemia: Acute on chronic.  Hemoglobin 12.1-> 10.7 and MCV elevated greater than 100.Marland Kitchen  Possibly secondary to hemoconcentration.  Labs previously noted that folate and vitamin B12 levels were within normal limits.  Denies any complaints of blood in stools. -Recheck vitamin B12 and folate in a.m. -Monitor H&H  Essential hypertension: Blood pressures currently maintained. -Continue atenolol and amlodipine   Diarrhea/constipation: Patient reported having episodes of diarrhea 2 days ago due to using too many stool softeners.  CT scan of the abdomen and pelvis did note mild fecal retention. -Continue to monitor intake and  output -Would resume stool softeners  Thrombocytopenia: Acute on chronic.  Platelet count 81-> 62.  Denies any history of alcohol abuse. -Continue to monitor  Weakness of both lower: Acute on chronic.  Patient reports having bilateral lower extremity weakness for which normally he can get around without assistance, but have been having more difficulty lately. -PT to eval and treat tomorrow morning  Chronic pain lower back pain: Patient reports having issues with his lower back.  Previous imaging noted small anterior endplate spurs of N0-N3 without any other significant abnormality back in 2014.  He is on pain medication of hydrocodone 10-325 mg 4 times a day as needed for -Continue hydrocodone as needed  Anxiety -Continue Xanax as needed    Nephrolithiasis: CT imaging of the abdomen and pelvis noted small nonobstructing stones. -Patient follow-up with urology  History of prostate cancer: Patient status post radiation treatment in 2014  DVT prophylaxis: SCDs Code Status: Full Family Communication:  Disposition Plan: Discharge home once medically stable Consults called: None Admission status: Inpatient, require more than 2 midnight stay  Norval Morton MD [facility]   If 7PM-7AM, please contact night-coverage   11/19/2020, 7:15 AM

## 2020-11-19 NOTE — ED Notes (Signed)
Pt noted to be tremoring and has elevated HR. Temp checked and 100 at this time. Tylenol given for temp control. No acute changes noted. Will continue to monitor.

## 2020-11-19 NOTE — ED Notes (Signed)
Patient transported to CT 

## 2020-11-19 NOTE — ED Notes (Signed)
Pt is starting to have some slight tremors while eating will admin prn xanax

## 2020-11-19 NOTE — ED Provider Notes (Signed)
[facility] EMERGENCY DEPARTMENT Provider Note   CSN: [id number] Arrival date & time: 11/18/20  1938     History Chief Complaint  Patient presents with   uti symptoms   Urinary Frequency    [name] is a 80 y.o. male.  Patient with a history of chronic urinary incontinence, kidney stones, previous UTI, CKD, chronic pain presenting from home with dysuria, burning with urination and foul-smelling urine.  Has had incontinence which is not something new.  Found to be febrile for EMS to 104.  He does complain of generalized weakness and chills and having some lower abdominal pain and pain with urination.  Denies any chest pain going on a brief episode of fleeting chest pain while he was in the waiting room that is now resolved.  He states it lasted just a few minutes. No abdominal pain or back pain now currently.  No chest pain or shortness of breath now. EMS found him to be febrile and tachycardic.  The history is provided by the patient. The history is limited by the condition of the patient.  Urinary Frequency Associated symptoms include abdominal pain. Pertinent negatives include no chest pain, no headaches and no shortness of breath.      Past Medical History:  Diagnosis Date   Anemia in chronic kidney disease (CKD)    Anxiety    Arthritis    Chronic back pain    Chronic constipation    CKD (chronic kidney disease), stage III (HCC)    Dyslipidemia    Eczema    Frequency of urination    GERD (gastroesophageal reflux disease)    Glaucoma, both eyes    Herniated disc    History of adenomatous polyp of colon    History of external beam radiation therapy    06-07-2012  to  08-02-2012;   prostate, 7800 cGy/40 sessions/ 5600cGy pelvic lymph nodes/40 sessions   History of kidney stones    History of prostate cancer 03/03/2012   urologist--- dr Eulogio Ditch;  dx 03/ 2014,  cT2b, Gleason 3+3;  completed IMRT 08-02-2012   History of sepsis    hx of  several--- last one [id number] due to complicated UTI secondary stone obstruction   History of TIA (transient ischemic attack) 12/2008   no residual   Hydrocele, bilateral    Hypertension    Impaired memory    Legally blind in right eye, 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   Renal calculus, right    Right ureteral calculus    Urinary incontinence    Weakness of both legs    per pt wife , pt's uses cane at times   Wears dentures    upper    Patient Active Problem List   Diagnosis Date Noted   Right ureteral stone 07/20/2020   Hydronephrosis of right kidney 64/33/2951   Acute metabolic encephalopathy 88/41/6606   Lactic acidosis 10/26/2019   Macrocytic anemia 10/26/2019   Acute urinary retention 10/26/2019   Sepsis secondary to UTI ([place]) 10/25/2019   Kidney stones 10/19/2019   History of prostate cancer 10/19/2019   Pyelonephritis of right kidney 30/16/0109   Complicated UTI (urinary tract infection) 06/26/2017   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[place]) 06/26/2017   TIA (transient ischemic attack) 06/26/2017   Anxiety 06/26/2017   Prostate cancer ([place]) 05/01/2012   Other and unspecified hyperlipidemia 04/12/2012   Chest pain, unspecified 04/12/2012   Discogenic low back pain  04/12/2012   Essential hypertension 04/11/2012    Past Surgical History:  Procedure Laterality Date   BILIARY STENT PLACEMENT  06/15/2011   Procedure: BILIARY STENT PLACEMENT;  Surgeon: Franchot Gallo, MD;  Location: [facility];  Service: Urology;  Laterality: Bilateral;   CYSTO/ BILATERAL RETROGRADE PYELOGRAM/ LEFT URETERSCOPIC STONE EXTRACTION  11/14/2005   LEFT URETERAL CALCULUS   CYSTO/ BILATERAL URETERAL STENTS  11/06/2005   @[facility]    CYSTO/ LEFT RETROGRADE PYELOGRAM/  LEFT STENT PLACMENT  05/21/2011   @[facility]    CYSTOSCOPY W/ URETERAL STENT PLACEMENT Right 07/20/2020   Procedure: CYSTOSCOPY WITH RETROGRADE PYELOGRAM/URETERAL STENT PLACEMENT;   Surgeon: Ceasar Mons, MD;  Location: [facility];  Service: Urology;  Laterality: Right;   CYSTOSCOPY W/ URETERAL STENT REMOVAL  06/15/2011   Procedure: CYSTOSCOPY WITH STENT REMOVAL;  Surgeon: Franchot Gallo, MD;  Location: [facility];  Service: Urology;  Laterality: Left;   CYSTOSCOPY W/ URETERAL STENT REMOVAL Right 08/24/2020   Procedure: CYSTOSCOPY WITH STENT REMOVAL;  Surgeon: Franchot Gallo, MD;  Location: [facility];  Service: Urology;  Laterality: Right;   CYSTOSCOPY WITH RETROGRADE PYELOGRAM, URETEROSCOPY AND STENT PLACEMENT Right 08/24/2020   Procedure: CYSTOSCOPY WITH RETROGRADE PYELOGRAM, URETEROSCOPY, STONE EXTRACTION  AND STENT REPLACEMENT;  Surgeon: Franchot Gallo, MD;  Location: [facility];  Service: Urology;  Laterality: Right;  2 HRS   EXTRACORPOREAL SHOCK WAVE LITHOTRIPSY     HOLMIUM LASER APPLICATION Right 0/27/2536   Procedure: HOLMIUM LASER APPLICATION;  Surgeon: Franchot Gallo, MD;  Location: [facility];  Service: Urology;  Laterality: Right;   INTERNAL URETHROTOMY/ TURP  12/23/2002   BPH W/ STRICTURE   PERCUTANEOUS NEPHROLITHOTOMY Bilateral    left 01-13-2010 and 01-29-2010;  right 11-07-2005 , 11-14-2005, and 12-09-2005   @[facility]    REPAIR LEFT INGUINAL HERNIA W/ MESH  12/23/2002   @[facility]    RIGHT URETERAL STENT PLACEMENT  02/10/2003   @[facility]    RIGHT URETEROSCOPIC STONE EXTRACTION  05/29/2003   @[facility]    URETEROSCOPY  06/15/2011   Procedure: URETEROSCOPY;  Surgeon: Franchot Gallo, MD;  Location: [facility];  Service: Urology;  Laterality: Bilateral;       Family History  Problem Relation Age of Onset   Hypertension Mother    Stroke Father    Hypertension Sister    Hypertension Brother     Social History   Tobacco Use   Smoking status: Former    Years: 40.00    Types: Cigarettes    Quit date: 06/10/1990    Years since quitting: 30.4   Smokeless tobacco: Never   Vaping Use   Vaping Use: Never used  Substance Use Topics   Alcohol use: No   Drug use: Never    Home Medications Prior to Admission medications   Medication Sig Start Date End Date Taking? Authorizing Provider  acetaminophen (TYLENOL) 325 MG tablet Take 2 tablets (650 mg total) by mouth every 6 (six) hours as needed for mild pain (or Fever >/= 101). 11/03/19   Nita Sells, MD  ALPHAGAN P 0.1 % SOLN Place 1 drop into the right eye every 8 (eight) hours. 09/25/19   [provider]  ALPRAZolam Duanne Moron) 0.5 MG tablet Take 0.5 mg by mouth 2 (two) times daily as needed for anxiety or sleep. 10/01/19   [provider]  amLODipine (NORVASC) 10 MG tablet Take 1 tablet (10 mg total) by mouth daily. Patient taking differently: Take 10 mg by mouth at bedtime. 03/16/17  Jaynee Eagles, PA-C  atenolol (TENORMIN) 50 MG tablet Take 1 tablet (50 mg total) by mouth daily. Pt needs appointment for further refills 10/22/17   Forrest Moron, MD  Calcium Carbonate-Simethicone (MAALOX MAX PO) Take by mouth as needed.    [provider]  HYDROcodone-acetaminophen (NORCO) 10-325 MG tablet Take 1 tablet by mouth 4 (four) times daily as needed for moderate pain or severe pain.  02/12/14   [provider]  Magnesium Hydroxide (DULCOLAX SOFT CHEWS PO) Take by mouth at bedtime.    [provider]  polyethylene glycol (MIRALAX / GLYCOLAX) 17 g packet Take 17 g by mouth daily as needed for mild constipation (For mild constipation not relieved by Senokot). Patient not taking: No sig reported 11/03/19   Nita Sells, MD  Potassium Citrate 15 MEQ (1620 MG) TBCR Take 15 mEq by mouth 3 (three) times daily. 03/05/20   [provider]  ROCKLATAN 0.02-0.005 % SOLN Place 1 drop into both eyes at bedtime. 09/24/19   [provider]  timolol (TIMOPTIC) 0.5 % ophthalmic solution Place 1 drop into both eyes daily. 01/11/17   [provider]    Allergies     Tape, Doxycycline hyclate, and Sulfa antibiotics  Review of Systems   Review of Systems  Constitutional:  Positive for activity change, appetite change and fever.  HENT:  Negative for congestion and rhinorrhea.   Respiratory:  Negative for cough, chest tightness and shortness of breath.   Cardiovascular:  Negative for chest pain.  Gastrointestinal:  Positive for abdominal pain. Negative for nausea and vomiting.  Genitourinary:  Positive for difficulty urinating, dysuria, frequency and hematuria.  Musculoskeletal:  Positive for arthralgias, back pain and myalgias.  Skin:  Negative for rash.  Neurological:  Negative for dizziness, weakness and headaches.   all other systems are negative except as noted in the HPI and PMH.   Physical Exam Updated Vital Signs BP 131/84   Pulse (!) 101   Temp 98.8 F (37.1 C) (Oral)   Resp (!) 22   SpO2 98%   Physical Exam Vitals and nursing note reviewed.  Constitutional:      General: He is not in acute distress.    Appearance: He is well-developed.  HENT:     Head: Normocephalic and atraumatic.     Mouth/Throat:     Pharynx: No oropharyngeal exudate.  Eyes:     Conjunctiva/sclera: Conjunctivae normal.     Pupils: Pupils are equal, round, and reactive to light.  Neck:     Comments: No meningismus. Cardiovascular:     Rate and Rhythm: Regular rhythm. Tachycardia present.     Heart sounds: Normal heart sounds. No murmur heard. Pulmonary:     Effort: Pulmonary effort is normal. No respiratory distress.     Breath sounds: Normal breath sounds.  Chest:     Chest wall: No tenderness.  Abdominal:     Palpations: Abdomen is soft.     Tenderness: There is no abdominal tenderness. There is no guarding or rebound.  Genitourinary:    Comments: Scrotal edema present, chronic by report Musculoskeletal:        General: No tenderness. Normal range of motion.     Cervical back: Normal range of motion and neck supple.     Comments: No CVAT   Skin:    General: Skin is warm.  Neurological:     General: No focal deficit present.     Mental Status: He is alert and oriented to  person, place, and time. Mental status is at baseline.     Cranial Nerves: No cranial nerve deficit.     Motor: No abnormal muscle tone.     Coordination: Coordination normal.     Comments:  5/5 strength throughout. CN 2-12 intact.Equal grip strength.   Psychiatric:        Behavior: Behavior normal.    ED Results / Procedures / Treatments   Labs (all labs ordered are listed, but only abnormal results are displayed) Labs Reviewed  COMPREHENSIVE METABOLIC PANEL - Abnormal; Notable for the following components:      Result Value   CO2 21 (*)    Glucose, Bld 212 (*)    Creatinine, Ser 2.60 (*)    Total Bilirubin 1.3 (*)    GFR, Estimated 24 (*)    All other components within normal limits  CBC WITH DIFFERENTIAL/PLATELET - Abnormal; Notable for the following components:   RBC 3.74 (*)    Hemoglobin 12.1 (*)    HCT 38.2 (*)    MCV 102.1 (*)    Platelets 81 (*)    Lymphs Abs 0.3 (*)    All other components within normal limits  LACTIC ACID, PLASMA - Abnormal; Notable for the following components:   Lactic Acid, Venous 2.7 (*)    All other components within normal limits  URINALYSIS, ROUTINE W REFLEX MICROSCOPIC - Abnormal; Notable for the following components:   APPearance CLOUDY (*)    Hgb urine dipstick LARGE (*)    Protein, ur 100 (*)    Leukocytes,Ua LARGE (*)    RBC / HPF >50 (*)    WBC, UA >50 (*)    Bacteria, UA RARE (*)    All other components within normal limits  RESP PANEL BY RT-PCR (FLU A&B, COVID) ARPGX2  URINE CULTURE  CULTURE, BLOOD (ROUTINE X 2)  CULTURE, BLOOD (ROUTINE X 2)  LACTIC ACID, PLASMA  URINALYSIS, ROUTINE W REFLEX MICROSCOPIC  LACTIC ACID, PLASMA  MAGNESIUM  COMPREHENSIVE METABOLIC PANEL  CBC WITH DIFFERENTIAL/PLATELET  TROPONIN I (HIGH SENSITIVITY)  TROPONIN I (HIGH SENSITIVITY)  TROPONIN I (HIGH  SENSITIVITY)    EKG EKG Interpretation  Date/Time:  Thursday November 19 2020 01:49:46 EST Ventricular Rate:  98 PR Interval:  139 QRS Duration: 72 QT Interval:  481 QTC Calculation: 615 R Axis:   48 Text Interpretation: Sinus rhythm Abnormal R-wave progression, early transition Borderline T abnormalities, anterior leads Prolonged QT interval No significant change was found Confirmed by Ezequiel Essex [phone]) on 11/19/2020 2:37:26 AM  Radiology DG Chest Portable 1 View  Result Date: 11/19/2020 CLINICAL DATA:  Fevers and possible sepsis, initial encounter EXAM: PORTABLE CHEST 1 VIEW COMPARISON:  07/20/2020 FINDINGS: Cardiac shadow is within normal limits. The lungs are hypoinflated. Patchy atelectatic changes are again noted in the left base stable from the prior study. No new focal abnormality is noted. IMPRESSION: Stable left basilar atelectasis/scarring. No acute abnormality noted. Electronically Signed   By: Inez Catalina M.D.   On: 11/19/2020 02:35   CT Renal Stone Study  Result Date: 11/19/2020 CLINICAL DATA:  Dysuria and urinary incontinence, initial encounter EXAM: CT ABDOMEN AND PELVIS WITHOUT CONTRAST TECHNIQUE: Multidetector CT imaging of the abdomen and pelvis was performed following the standard protocol without IV contrast. COMPARISON:  07/30/2020 FINDINGS: Lower chest: Lung bases are free of acute infiltrate or sizable effusion. Mild scarring is seen. Hepatobiliary: Scattered hypodensities are noted throughout the liver most consistent with cysts. These were better appreciated on the prior exam.  Gallbladder is within normal limits. Pancreas: Unremarkable. No pancreatic ductal dilatation or surrounding inflammatory changes. Spleen: Normal in size without focal abnormality. Adrenals/Urinary Tract: Adrenal glands are within normal limits. Right kidney is somewhat shrunken stable from prior exam. Previously seen calculi on the right are significantly reduced consistent with  interval intervention. Small nonobstructing lower pole stone remains. Bladder is partially distended. No obstructive changes are noted on the left. Small nonobstructing stone is noted in the lower pole of the left kidney new from the prior study. Stomach/Bowel: Scattered diverticular change of the colon is noted. No findings to suggest diverticulitis are seen. Mild retained fecal material is noted without evidence of constipation or obstruction. The appendix is within normal limits. Small bowel and stomach are unremarkable. Vascular/Lymphatic: Aortic atherosclerosis. No enlarged abdominal or pelvic lymph nodes. Reproductive: Surgical clip is again noted in the prostate bed. Other: Small fat containing umbilical hernia is noted. No ascites is seen. Musculoskeletal: Degenerative changes of the lumbar spine are noted. IMPRESSION: Near complete resolution of previously seen right renal and ureteral stones following intervention. A small nonobstructing stone is noted in the lower pole measuring 2 mm. Nonobstructing lower pole left renal stone measuring 2 mm. Diverticulosis without diverticulitis. Electronically Signed   By: Inez Catalina M.D.   On: 11/19/2020 03:40    Procedures .Critical Care Performed by: Ezequiel Essex, MD Authorized by: Ezequiel Essex, MD   Critical care provider statement:    Critical care time (minutes):  35   Critical care was necessary to treat or prevent imminent or life-threatening deterioration of the following conditions:  Sepsis   Critical care was time spent personally by me on the following activities:  Development of treatment plan with patient or surrogate, discussions with consultants, evaluation of patient's response to treatment, examination of patient, ordering and review of laboratory studies, ordering and review of radiographic studies, ordering and performing treatments and interventions, pulse oximetry, re-evaluation of patient's condition and review of old charts    Medications Ordered in ED Medications  meropenem (MERREM) 1 g in sodium chloride 0.9 % 100 mL IVPB (has no administration in time range)  lactated ringers infusion (has no administration in time range)  lactated ringers bolus 1,000 mL (1,000 mLs Intravenous New Bag/Given 11/19/20 0155)    ED Course  I have reviewed the triage vital signs and the nursing notes.  Pertinent labs & imaging results that were available during my care of the patient were reviewed by me and considered in my medical decision making (see chart for details).    MDM Rules/Calculators/A&P                          UTI symptoms with fever.  Vital stable though tachycardic.  Patient started on broad-spectrum antibiotics and IV fluids after cultures were obtained.  Will initiate meropenem given his history of ESBL.  Initial lactate elevated at 2.7.  Patient tachycardic.  He is given IV fluids and IV antibiotics. Creatinine 2.6 from 1.8.  CT scan obtained to rule out obstructing infected kidney stone.  This shows no ureteral stones but some stones in the kidneys themselves.  On recheck, patient feels improved.  Heart rate has normalized to 85.  Denies abdominal pain or flank pain.  Given history of ESBL infection and sepsis we will plan admission for IV fluids and antibiotics. Discussed with Dr. Velia Meyer.  Final Clinical Impression(s) / ED Diagnoses Final diagnoses:  Sepsis with acute renal failure without septic  shock, due to unspecified organism, unspecified acute renal failure type ([place])  Complicated UTI (urinary tract infection)    Rx / DC Orders ED Discharge Orders     None        Zahriah Roes, Annie Main, MD 11/19/20 [phone]

## 2020-11-19 NOTE — Progress Notes (Signed)
Carryover admission to day admitter:   Per my discussions with the EDP, Dr. Wyvonnia Dusky, this is an 80 year old male who is being admitted for severe sepsis due to UTI after presenting with 2 to 3 days of new onset dysuria associated with development of foul-smelling urine, with ensuing urinalysis suggestive of UTI.  Has a history of prior UTI, including ESBL, with hospitalization reported in July 2022 associated with retained stone.  Imaging today shows no evidence of obstructing ureterolithiasis.  Per EMS, initial temperature 104.  Additional SIRS criteria met via mild tachypnea.  Initial lactic acid mildly elevated, although this has reportedly improved following IV fluids; presentation also appears to be associated with aki superimposed on chronic kidney disease.  Has been started on meropenem in the setting of a history of ESBL.  I placed an order for inpatient admission to med telemetry, and completed some preliminary orders via the adult comorbid order set.  Meropenem continued.     Babs Bertin, DO Hospitalist

## 2020-11-19 NOTE — Progress Notes (Signed)
Pharmacy Antibiotic Note  [name] is a 80 y.o. male admitted on 11/18/2020 presenting with new onset dysuria, concern for sepsis, hx of ESBL UTI.  Pharmacy has been consulted for Merrem dosing.  Plan: Continue Merrem 1g q 12h  Monitor renal function, UCx and ability to narrow  Height: 6' (182.9 cm) Weight: 83 kg (183 lb) IBW/kg (Calculated) : 77.6  Temp (24hrs), Avg:99.3 F (37.4 C), Min:98.8 F (37.1 C), Max:100 F (37.8 C)  Recent Labs  Lab 11/18/20 2032 11/19/20 0130 11/19/20 0415  WBC 9.0  --  7.4  CREATININE 2.60*  --  2.57*  LATICACIDVEN 2.7* 1.9  --     Estimated Creatinine Clearance: 25.2 mL/min (A) (by C-G formula based on SCr of 2.57 mg/dL (H)).    Allergies  Allergen Reactions   Tape Other (See Comments)    Redness and blisters/paper tape-not latex   Doxycycline Hyclate Other (See Comments)    Mental changes    Sulfa Antibiotics Hives    Bertis Ruddy, PharmD Clinical Pharmacist ED Pharmacist Phone # [phone] 11/19/2020 9:47 AM

## 2020-11-20 LAB — BLOOD CULTURE ID PANEL (REFLEXED) - BCID2

## 2020-11-20 LAB — BASIC METABOLIC PANEL
Anion gap: 5 (ref 5–15)
BUN: 16 mg/dL (ref 8–23)
CO2: 26 mmol/L (ref 22–32)
Calcium: 9 mg/dL (ref 8.9–10.3)
Chloride: 105 mmol/L (ref 98–111)
Creatinine, Ser: 2.06 mg/dL — ABNORMAL HIGH (ref 0.61–1.24)
GFR, Estimated: 32 mL/min — ABNORMAL LOW (ref >60)
Glucose, Bld: 147 mg/dL — ABNORMAL HIGH (ref 70–99)
Potassium: 4.1 mmol/L (ref 3.5–5.1)
Sodium: 136 mmol/L (ref 135–145)

## 2020-11-20 LAB — FOLATE: Folate: 22.3 ng/mL (ref >5.9)

## 2020-11-20 LAB — URINE CULTURE: Culture: 90000 — AB

## 2020-11-20 LAB — VITAMIN B12: Vitamin B-12: 311 pg/mL (ref 180–914)

## 2020-11-20 MED ORDER — SODIUM CHLORIDE 0.9 % IV SOLN
2.0000 g | INTRAVENOUS | Status: DC
  Administered 2020-11-20: 2 g via INTRAVENOUS
  Filled 2020-11-20: qty 20

## 2020-11-20 MED ORDER — LATANOPROST 0.005 % OP SOLN
1.0000 [drp] | Freq: Every day | OPHTHALMIC | Status: DC
  Administered 2020-11-20: 1 [drp] via OPHTHALMIC
  Filled 2020-11-20: qty 2.5

## 2020-11-20 MED ORDER — POLYETHYLENE GLYCOL 3350 17 G PO PACK: 17.0000 g | PACK | Freq: Every day | ORAL | Status: DC | PRN

## 2020-11-20 MED ORDER — TIMOLOL MALEATE 0.5 % OP SOLN
1.0000 [drp] | Freq: Every day | OPHTHALMIC | Status: DC
  Administered 2020-11-20 – 2020-11-21 (×2): 1 [drp] via OPHTHALMIC
  Filled 2020-11-20: qty 5

## 2020-11-20 MED ORDER — ENSURE ENLIVE PO LIQD
237.0000 mL | Freq: Two times a day (BID) | ORAL | Status: DC
  Administered 2020-11-20 – 2020-11-21 (×2): 237 mL via ORAL

## 2020-11-20 MED ORDER — SENNOSIDES-DOCUSATE SODIUM 8.6-50 MG PO TABS: 1.0000 | ORAL_TABLET | Freq: Two times a day (BID) | ORAL | Status: DC | PRN

## 2020-11-20 MED ORDER — BRINZOLAMIDE 1 % OP SUSP
1.0000 [drp] | Freq: Three times a day (TID) | OPHTHALMIC | Status: DC
  Administered 2020-11-20 – 2020-11-21 (×4): 1 [drp] via OPHTHALMIC
  Filled 2020-11-20: qty 10

## 2020-11-20 MED ORDER — BRIMONIDINE TARTRATE 0.2 % OP SOLN
1.0000 [drp] | Freq: Three times a day (TID) | OPHTHALMIC | Status: DC
  Administered 2020-11-20 – 2020-11-21 (×4): 1 [drp] via OPHTHALMIC
  Filled 2020-11-20: qty 5

## 2020-11-20 NOTE — Progress Notes (Signed)
Initial Nutrition Assessment  DOCUMENTATION CODES:   Non-severe (moderate) malnutrition in context of chronic illness  INTERVENTION:  - Ensure Enlive po BID, each supplement provides 350 kcal and 20 grams of protein (prefers vanilla)  NUTRITION DIAGNOSIS:   Moderate Malnutrition related to chronic illness (recurrent UTI with sepsis) as evidenced by mild fat depletion, moderate muscle depletion.   GOAL:   Patient will meet greater than or equal to 90% of their needs   MONITOR:   Supplement acceptance, PO intake, Labs, Weight trends  REASON FOR ASSESSMENT:   Malnutrition Screening Tool    ASSESSMENT:   Pt admitted with sepsis secondary to UTI. 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  Pt has had multiple prior hospital admissions d/t sepsis secondary to UTI.  Pt reports having an appetite that comes and goes. It has been this way for quite some time but more so for about 1-2 weeks now. He reports trying to eat despite having a poor appetite and that his family at home encourages him to eat. Explained the importance of adequate nutrition and protein intake for muscle maintenance and healing. Recommended pt to try eating smaller, more frequent meals throughout the day if he feels like he cannot eat a full meal 3 times a day. He appreciated this idea. Noted meal completions of 25-100% x 2 recorded meals today.  He denies n/v/d or any difficulty chewing or swallowing. At home he is mobile and has a walker if needed but does not use it often.  Pt reports that his weight is usually 200-205 lbs. He denies any recent weight loss. Limited documentation of weight history however, noted slowly trending downward over the past 3 years.  Medications: IV abx  Labs: Cr 2.06  NUTRITION - FOCUSED PHYSICAL EXAM:  Flowsheet Row Most Recent Value  Orbital Region Mild depletion   Upper Arm Region Mild depletion  Thoracic and Lumbar Region Mild depletion  Buccal Region Mild depletion  Temple Region Moderate depletion  Clavicle Bone Region Mild depletion  Clavicle and Acromion Bone Region No depletion  Scapular Bone Region Mild depletion  Dorsal Hand Mild depletion  Patellar Region Moderate depletion  Anterior Thigh Region Moderate depletion  Posterior Calf Region Moderate depletion  Edema (RD Assessment) None  Hair Reviewed  Eyes Reviewed  Mouth Reviewed  Skin Reviewed  Nails Reviewed       Diet Order:   Diet Order             Diet regular Room service appropriate? Yes; Fluid consistency: Thin  Diet effective now                   EDUCATION NEEDS:   Education needs have been addressed  Skin:  Skin Assessment: Reviewed RN Assessment  Last BM:  11/18/20  Height:   Ht Readings from Last 1 Encounters:  11/20/20 6' (1.829 m)    Weight:   Wt Readings from Last 1 Encounters:  11/20/20 86.9 kg    BMI:  Body mass index is 25.98 kg/m.  Estimated Nutritional Needs:   Kcal:  1800-2000  Protein:  90-100g  Fluid:  >1.8L  Clayborne Dana, RDN, LDN Clinical Nutrition

## 2020-11-20 NOTE — Progress Notes (Signed)
Pt admitted to room from ED. Oriented to room and call bell system. Pt instructed to call for assist due to generalized weakness. Bed in lowest position, call bell within reach, alarm activated.

## 2020-11-20 NOTE — Progress Notes (Signed)
PHARMACY - PHYSICIAN COMMUNICATION CRITICAL VALUE ALERT - BLOOD CULTURE IDENTIFICATION (BCID)  Cameron FRONCZAK is an 80 y.o. male who presented to [facility] on 11/18/2020  Assessment:  40 yom presenting with sepsis due to UTI. Blood cultures now growing proteus, no resistance detected.  Name of physician (or Provider) ContactedStarla Link, K  Current antibiotics: meropenem  Changes to prescribed antibiotics recommended:  De-escalate to ceftriaxone 2g IV q24h per protocol for [facility] patients  Results for orders placed or performed during the hospital encounter of 11/18/20  Blood Culture ID Panel (Reflexed) (Collected: 11/18/2020  8:32 PM)  Result Value Ref Range   Enterococcus faecalis NOT DETECTED NOT DETECTED   Enterococcus Faecium NOT DETECTED NOT DETECTED   Listeria monocytogenes NOT DETECTED NOT DETECTED   Staphylococcus species NOT DETECTED NOT DETECTED   Staphylococcus aureus (BCID) NOT DETECTED NOT DETECTED   Staphylococcus epidermidis NOT DETECTED NOT DETECTED   Staphylococcus lugdunensis NOT DETECTED NOT DETECTED   Streptococcus species NOT DETECTED NOT DETECTED   Streptococcus agalactiae NOT DETECTED NOT DETECTED   Streptococcus pneumoniae NOT DETECTED NOT DETECTED   Streptococcus pyogenes NOT DETECTED NOT DETECTED   A.calcoaceticus-baumannii NOT DETECTED NOT DETECTED   Bacteroides fragilis NOT DETECTED NOT DETECTED   Enterobacterales DETECTED (A) NOT DETECTED   Enterobacter cloacae complex NOT DETECTED NOT DETECTED   Escherichia coli NOT DETECTED NOT DETECTED   Klebsiella aerogenes NOT DETECTED NOT DETECTED   Klebsiella oxytoca NOT DETECTED NOT DETECTED   Klebsiella pneumoniae NOT DETECTED NOT DETECTED   Proteus species DETECTED (A) NOT DETECTED   Salmonella species NOT DETECTED NOT DETECTED   Serratia marcescens NOT DETECTED NOT DETECTED   Haemophilus influenzae NOT DETECTED NOT DETECTED   Neisseria meningitidis NOT DETECTED NOT DETECTED   Pseudomonas  aeruginosa NOT DETECTED NOT DETECTED   Stenotrophomonas maltophilia NOT DETECTED NOT DETECTED   Candida albicans NOT DETECTED NOT DETECTED   Candida auris NOT DETECTED NOT DETECTED   Candida glabrata NOT DETECTED NOT DETECTED   Candida krusei NOT DETECTED NOT DETECTED   Candida parapsilosis NOT DETECTED NOT DETECTED   Candida tropicalis NOT DETECTED NOT DETECTED   Cryptococcus neoformans/gattii NOT DETECTED NOT DETECTED   CTX-M ESBL NOT DETECTED NOT DETECTED   Carbapenem resistance IMP NOT DETECTED NOT DETECTED   Carbapenem resistance KPC NOT DETECTED NOT DETECTED   Carbapenem resistance NDM NOT DETECTED NOT DETECTED   Carbapenem resist OXA 48 LIKE NOT DETECTED NOT DETECTED   Carbapenem resistance VIM NOT DETECTED NOT DETECTED    Arturo Morton, PharmD, BCPS Please check AMION for all [facility] contact numbers Clinical Pharmacist 11/20/2020 5:17 PM

## 2020-11-20 NOTE — Evaluation (Signed)
Physical Therapy Evaluation Patient Details Name: [name] MRN: [id number] DOB: 1940/09/26 Today's Date: 11/20/2020  History of Present Illness  80 y.o. male presents to [facility] hospital on 11/18/2020 with reports of painful urination and weakness. Pt was noted to be febrile by EMS. Pt also reporting chest pain and SOB when waiting in ED. 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  Clinical Impression  Pt presents to PT with deficits in gait, balance, power, endurance, functional mobility. Pt demonstrates generalized LE weakness and slowed gait speed, placing him at an increased risk for falls. PT encourages frequent mobilization with staff assistance during this admission. PT also encourages use of cane upon return home in an effort to improve stability. Pt will also benefit from outpatient PT at the time of discharge to aide in improving balance and gait quality.     Recommendations for follow up therapy are one component of a multi-disciplinary discharge planning process, led by the attending physician.  Recommendations may be updated based on patient status, additional functional criteria and insurance authorization.  Follow Up Recommendations Outpatient PT    Assistance Recommended at Discharge Intermittent Supervision/Assistance  Functional Status Assessment Patient has had a recent decline in their functional status and demonstrates the ability to make significant improvements in function in a reasonable and predictable amount of time.  Equipment Recommendations  None recommended by PT (pt owns necessary DME)    Recommendations for Other Services       Precautions / Restrictions Precautions Precautions: Fall Restrictions Weight Bearing Restrictions: No      Mobility  Bed Mobility Overal bed mobility: Modified  Independent             General bed mobility comments: HOB elevated    Transfers Overall transfer level: Needs assistance Equipment used: None Transfers: Sit to/from Stand Sit to Stand: Supervision                Ambulation/Gait Ambulation/Gait assistance: Supervision Gait Distance (Feet): 150 Feet Assistive device: None Gait Pattern/deviations: Step-to pattern Gait velocity: reduced Gait velocity interpretation: <1.8 ft/sec, indicate of risk for recurrent falls   General Gait Details: pt with slowed shuffling gait, reduced step and stride length  Stairs            Wheelchair Mobility    Modified Rankin (Stroke Patients Only)       Balance Overall balance assessment: Needs assistance Sitting-balance support: No upper extremity supported;Feet supported Sitting balance-Leahy Scale: Good     Standing balance support: No upper extremity supported;During functional activity Standing balance-Leahy Scale: Fair                               Pertinent Vitals/Pain Pain Assessment: No/denies pain    Home Living Family/patient expects to be discharged to:: Private residence Living Arrangements: Spouse/significant other;Children Available Help at Discharge: Family Type of Home: House Home Access: Level entry       Home Layout: Two level;Able to live on main level with bedroom/bathroom Home Equipment: Rolling Walker (2 wheels);Cane - single point      Prior Function Prior Level of Function : Independent/Modified Independent             Mobility Comments: pt is retired, no current hobbies of note       Hand Dominance   Dominant Hand:  Right    Extremity/Trunk Assessment   Upper Extremity Assessment Upper Extremity Assessment: Overall WFL for tasks assessed    Lower Extremity Assessment Lower Extremity Assessment: Generalized weakness    Cervical / Trunk Assessment Cervical / Trunk Assessment: Normal  Communication    Communication: No difficulties  Cognition Arousal/Alertness: Awake/alert Behavior During Therapy: WFL for tasks assessed/performed Overall Cognitive Status: Within Functional Limits for tasks assessed                                          General Comments General comments (skin integrity, edema, etc.): pt is incontinent of urine at baseline, wears depends during the day, reports increased risk of incontinence with position changes    Exercises     Assessment/Plan    PT Assessment Patient needs continued PT services  PT Problem List Decreased strength;Decreased activity tolerance;Decreased balance;Decreased mobility;Decreased knowledge of use of DME       PT Treatment Interventions DME instruction;Gait training;Functional mobility training;Therapeutic activities;Therapeutic exercise;Balance training;Neuromuscular re-education;Patient/family education    PT Goals (Current goals can be found in the Care Plan section)  Acute Rehab PT Goals Patient Stated Goal: to improve balance PT Goal Formulation: With patient Time For Goal Achievement: 12/04/20 Potential to Achieve Goals: Good Additional Goals Additional Goal #1: Pt will score >19/24 on DGI to indicate a reduced risk for falls    Frequency Min 3X/week   Barriers to discharge        Co-evaluation               AM-PAC PT "6 Clicks" Mobility  Outcome Measure Help needed turning from your back to your side while in a flat bed without using bedrails?: None Help needed moving from lying on your back to sitting on the side of a flat bed without using bedrails?: None Help needed moving to and from a bed to a chair (including a wheelchair)?: A Little Help needed standing up from a chair using your arms (e.g., wheelchair or bedside chair)?: A Little Help needed to walk in hospital room?: A Little Help needed climbing 3-5 steps with a railing? : A Lot 6 Click Score: 19    End of Session   Activity  Tolerance: Patient tolerated treatment well Patient left: in bed;with call bell/phone within reach Nurse Communication: Mobility status PT Visit Diagnosis: Other abnormalities of gait and mobility (R26.89);Muscle weakness (generalized) (M62.81)    Time: 3151-7616 PT Time Calculation (min) (ACUTE ONLY): 23 min   Charges:   PT Evaluation $PT Eval Low Complexity: [place], PT, DPT Acute Rehabilitation Pager: [phone] Office [phone]   Zenaida Niece 11/20/2020, 10:05 AM

## 2020-11-20 NOTE — Progress Notes (Signed)
Patient ID: [name], male   DOB: 1940/11/03, 80 y.o.   MRN: [id number]  PROGRESS NOTE    [name]  HGD:[id number] DOB: 1940-03-10 DOA: 11/18/2020 PCP: Milford Cage, PA   Brief Narrative:  80 y.o. 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  His temperature was 104 with EMS.  On presentation, temperature was 100 with intermittent tachycardia, tachypnea with creatinine of 2.6 and elevated lactic acid.  Chest x-ray was negative for acute abnormality.  UA was suggestive of UTI.  CT renal stone study noted near complete resolution of previously seen right renal and ureteral stones following intervention with small nonobstructing stones noted at this time.  He was started on IV fluids and antibiotics.  Assessment & Plan:   Severe sepsis: Present on admission UTI: Present on admission -Presented with fever, tachycardia, tachypnea, AKI and elevated lactic acid level with evidence of UTI -Patient has history of ESBL UTI in the past.  Currently on meropenem.  Follow cultures.  Decrease Ringer's lactate to 50 cc an hour.  Acute kidney injury on chronic kidney disease stage IIIb -Baseline creatinine around 2.  Presented with creatinine of 2.6.  Current plan on IV fluids.  Plan as above.  Creatinine improving to 2.06 this morning.    Acute on chronic thrombocytopenia -No evidence of bleeding.  Repeat a.m. labs.  Chronic macrocytic anemia -Questionable cause.  Hemoglobin stable.  Folic acid and vitamin B12 levels normal.  Essential hypertension -Blood pressure on the higher side.  Continue metoprolol and add amlodipine  Bilateral lower extremity weakness -Possibly from sepsis/UTI.  PT eval.  Chronic pain/lower back pain -Patient reports having issues with his  lower back.  Previous imaging noted small anterior endplate spurs of D6-Q2 without any other significant abnormality back in 2014.  He is on pain medication of hydrocodone 10-325 mg 4 times a day as needed for -Continue hydrocodone as needed  Anxiety -Continue Xanax as needed     Nephrolithiasis: CT imaging of the abdomen and pelvis noted small nonobstructing stones. -Outpatient follow-up with urology   History of prostate cancer: status post radiation treatment in 2014   DVT prophylaxis: SCDs Code Status: Full Family Communication: None at bedside Disposition Plan: Status is: Inpatient  Remains inpatient appropriate because: Of need of IV antibiotics and fluids.  Consultants: None  Procedures: None  Antimicrobials: Meropenem from 11/19/2020 onwards   Subjective: Patient seen and examined at bedside.  Still complains of some burning in the urination with urination but slightly better.  Still feels weak.  No overnight fever, vomiting, worsening shortness of breath reported.  Objective: Vitals:   11/19/20 2315 11/20/20 0051 11/20/20 0454 11/20/20 0920  BP: (!) 150/85 (!) 148/96 (!) 146/86 137/84  Pulse: 80 84 87 94  Resp: (!) 21 20 16 17   Temp:  98.1 F (36.7 C) 97.8 F (36.6 C) 97.7 F (36.5 C)  TempSrc:  Oral Oral   SpO2: 99% 96% 98% 100%  Weight:   86.9 kg   Height:   6' (1.829 m)     Intake/Output Summary (Last 24 hours) at 11/20/2020 1035 Last data filed at 11/20/2020 0944 Gross per 24 hour  Intake 2380 ml  Output 475 ml  Net 1905 ml   Filed Weights   11/19/20 0750 11/20/20 0454  Weight: 83 kg 86.9 kg  Examination:  General exam: Appears calm and comfortable.  Currently on room air.  Elderly male lying in bed. Respiratory system: Bilateral decreased breath sounds at bases with intermittent tachypnea Cardiovascular system: S1 & S2 heard, Rate controlled Gastrointestinal system: Abdomen is nondistended, soft and nontender. Normal bowel sounds  heard. Extremities: No cyanosis, clubbing, edema  Central nervous system: Alert and oriented. No focal neurological deficits. Moving extremities Skin: No rashes, lesions or ulcers Psychiatry: Judgement and insight appear normal. Mood & affect appropriate.     Data Reviewed: I have personally reviewed following labs and imaging studies  CBC: Recent Labs  Lab 11/18/20 2032 11/19/20 0415 11/19/20 1300  WBC 9.0 7.4  --   NEUTROABS 7.6 5.4  --   HGB 12.1* 10.7* 11.5*  HCT 38.2* 33.0* 36.0*  MCV 102.1* 102.8*  --   PLT 81* 62*  --    Basic Metabolic Panel: Recent Labs  Lab 11/18/20 2032 11/19/20 0415 11/20/20 0335  NA 136 137 136  K 4.0 4.8 4.1  CL 104 104 105  CO2 21* 26 26  GLUCOSE 212* 126* 147*  BUN 19 21 16   CREATININE 2.60* 2.57* 2.06*  CALCIUM 9.3 8.9 9.0  MG  --  2.4  --    GFR: Estimated Creatinine Clearance: 31.4 mL/min (A) (by C-G formula based on SCr of 2.06 mg/dL (H)). Liver Function Tests: Recent Labs  Lab 11/18/20 2032 11/19/20 0415  AST 23 18  ALT 6 12  ALKPHOS 59 54  BILITOT 1.3* 0.9  PROT 7.3 6.4*  ALBUMIN 3.8 3.1*   No results for input(s): LIPASE, AMYLASE in the last 168 hours. No results for input(s): AMMONIA in the last 168 hours. Coagulation Profile: No results for input(s): INR, PROTIME in the last 168 hours. Cardiac Enzymes: No results for input(s): CKTOTAL, CKMB, CKMBINDEX, TROPONINI in the last 168 hours. BNP (last 3 results) No results for input(s): PROBNP in the last 8760 hours. HbA1C: No results for input(s): HGBA1C in the last 72 hours. CBG: No results for input(s): GLUCAP in the last 168 hours. Lipid Profile: No results for input(s): CHOL, HDL, LDLCALC, TRIG, CHOLHDL, LDLDIRECT in the last 72 hours. Thyroid Function Tests: Recent Labs    11/19/20 0911  TSH 1.269   Anemia Panel: Recent Labs    11/20/20 0335  VITAMINB12 311  FOLATE 22.3   Sepsis Labs: Recent Labs  Lab 11/18/20 2032 11/19/20 0130  LATICACIDVEN  2.7* 1.9    Recent Results (from the past 240 hour(s))  Urine Culture     Status: Abnormal (Preliminary result)   Collection Time: 11/18/20  7:44 PM   Specimen: Urine, Clean Catch  Result Value Ref Range Status   Specimen Description URINE, CLEAN CATCH  Final   Special Requests NONE  Final   Culture (A)  Final    90,000 COLONIES/mL GRAM NEGATIVE RODS IDENTIFICATION AND SUSCEPTIBILITIES TO FOLLOW Performed at [facility], [place] [address] [place], [place] [address]    Report Status PENDING  Incomplete  Resp Panel by RT-PCR (Flu A&B, Covid) Nasopharyngeal Swab     Status: None   Collection Time: 11/18/20  7:44 PM   Specimen: Nasopharyngeal Swab; Nasopharyngeal(NP) swabs in vial transport medium  Result Value Ref Range Status   SARS Coronavirus 2 by RT PCR NEGATIVE NEGATIVE Final    Comment: (NOTE) SARS-CoV-2 target nucleic acids are NOT DETECTED.  The SARS-CoV-2 RNA is generally detectable in upper respiratory specimens during the acute phase of infection. The lowest concentration  of SARS-CoV-2 viral copies this assay can detect is 138 copies/mL. A negative result does not preclude SARS-Cov-2 infection and should not be used as the sole basis for treatment or other patient management decisions. A negative result may occur with  improper specimen collection/handling, submission of specimen other than nasopharyngeal swab, presence of viral mutation(s) within the areas targeted by this assay, and inadequate number of viral copies(<138 copies/mL). A negative result must be combined with clinical observations, patient history, and epidemiological information. The expected result is Negative.  Fact Sheet for Patients:  EntrepreneurPulse.com.au  Fact Sheet for Healthcare Providers:  IncredibleEmployment.be  This test is no t yet approved or cleared by the [place] FDA and  has been authorized for detection and/or diagnosis of SARS-CoV-2  by FDA under an Emergency Use Authorization (EUA). This EUA will remain  in effect (meaning this test can be used) for the duration of the COVID-19 declaration under Section 564(b)(1) of the Act, 21 U.S.C.section 360bbb-3(b)(1), unless the authorization is terminated  or revoked sooner.       Influenza A by PCR NEGATIVE NEGATIVE Final   Influenza B by PCR NEGATIVE NEGATIVE Final    Comment: (NOTE) The Xpert Xpress SARS-CoV-2/FLU/RSV plus assay is intended as an aid in the diagnosis of influenza from Nasopharyngeal swab specimens and should not be used as a sole basis for treatment. Nasal washings and aspirates are unacceptable for Xpert Xpress SARS-CoV-2/FLU/RSV testing.  Fact Sheet for Patients: EntrepreneurPulse.com.au  Fact Sheet for Healthcare Providers: IncredibleEmployment.be  This test is not yet approved or cleared by the [place] FDA and has been authorized for detection and/or diagnosis of SARS-CoV-2 by FDA under an Emergency Use Authorization (EUA). This EUA will remain in effect (meaning this test can be used) for the duration of the COVID-19 declaration under Section 564(b)(1) of the Act, 21 U.S.C. section 360bbb-3(b)(1), unless the authorization is terminated or revoked.  Performed at [facility], [place] [address], [place], [place] [address]   Blood culture (routine x 2)     Status: None (Preliminary result)   Collection Time: 11/18/20  8:32 PM   Specimen: BLOOD RIGHT ARM  Result Value Ref Range Status   Specimen Description BLOOD RIGHT ARM  Final   Special Requests   Final    BOTTLES DRAWN AEROBIC AND ANAEROBIC Blood Culture adequate volume   Culture   Final    NO GROWTH < 24 HOURS Performed at [facility], [place] [address] [place], [place] [address]    Report Status PENDING  Incomplete  Blood culture (routine x 2)     Status: None (Preliminary result)   Collection Time: 11/18/20  8:32 PM   Specimen:  BLOOD LEFT ARM  Result Value Ref Range Status   Specimen Description BLOOD LEFT ARM  Final   Special Requests   Final    BOTTLES DRAWN AEROBIC ONLY Blood Culture adequate volume   Culture   Final    NO GROWTH < 24 HOURS Performed at [facility], [place] [address], [place], [place] [address]    Report Status PENDING  Incomplete         Radiology Studies: DG Chest Portable 1 View  Result Date: 11/19/2020 CLINICAL DATA:  Fevers and possible sepsis, initial encounter EXAM: PORTABLE CHEST 1 VIEW COMPARISON:  07/20/2020 FINDINGS: Cardiac shadow is within normal limits. The lungs are hypoinflated. Patchy atelectatic changes are again noted in the left base stable from the prior study. No new  focal abnormality is noted. IMPRESSION: Stable left basilar atelectasis/scarring. No acute abnormality noted. Electronically Signed   By: Inez Catalina M.D.   On: 11/19/2020 02:35   CT Renal Stone Study  Result Date: 11/19/2020 CLINICAL DATA:  Dysuria and urinary incontinence, initial encounter EXAM: CT ABDOMEN AND PELVIS WITHOUT CONTRAST TECHNIQUE: Multidetector CT imaging of the abdomen and pelvis was performed following the standard protocol without IV contrast. COMPARISON:  07/30/2020 FINDINGS: Lower chest: Lung bases are free of acute infiltrate or sizable effusion. Mild scarring is seen. Hepatobiliary: Scattered hypodensities are noted throughout the liver most consistent with cysts. These were better appreciated on the prior exam. Gallbladder is within normal limits. Pancreas: Unremarkable. No pancreatic ductal dilatation or surrounding inflammatory changes. Spleen: Normal in size without focal abnormality. Adrenals/Urinary Tract: Adrenal glands are within normal limits. Right kidney is somewhat shrunken stable from prior exam. Previously seen calculi on the right are significantly reduced consistent with interval intervention. Small nonobstructing lower pole stone remains. Bladder is partially  distended. No obstructive changes are noted on the left. Small nonobstructing stone is noted in the lower pole of the left kidney new from the prior study. Stomach/Bowel: Scattered diverticular change of the colon is noted. No findings to suggest diverticulitis are seen. Mild retained fecal material is noted without evidence of constipation or obstruction. The appendix is within normal limits. Small bowel and stomach are unremarkable. Vascular/Lymphatic: Aortic atherosclerosis. No enlarged abdominal or pelvic lymph nodes. Reproductive: Surgical clip is again noted in the prostate bed. Other: Small fat containing umbilical hernia is noted. No ascites is seen. Musculoskeletal: Degenerative changes of the lumbar spine are noted. IMPRESSION: Near complete resolution of previously seen right renal and ureteral stones following intervention. A small nonobstructing stone is noted in the lower pole measuring 2 mm. Nonobstructing lower pole left renal stone measuring 2 mm. Diverticulosis without diverticulitis. Electronically Signed   By: Inez Catalina M.D.   On: 11/19/2020 03:40        Scheduled Meds:  atenolol  50 mg Oral Daily   brimonidine  1 drop Right Eye TID   brinzolamide  1 drop Right Eye TID   latanoprost  1 drop Both Eyes QHS   timolol  1 drop Both Eyes Daily   Continuous Infusions:  lactated ringers 100 mL/hr at 11/20/20 0507   meropenem (MERREM) IV 1 g (11/20/20 0846)          Aline August, MD [facility] 11/20/2020, 10:35 AM

## 2020-11-21 LAB — CBC WITH DIFFERENTIAL/PLATELET
Abs Immature Granulocytes: 0.02 10*3/uL (ref 0.00–0.07)
Basophils Absolute: 0 10*3/uL (ref 0.0–0.1)
Basophils Relative: 0 %
Eosinophils Absolute: 0 10*3/uL (ref 0.0–0.5)
Eosinophils Relative: 1 %
HCT: 31.7 % — ABNORMAL LOW (ref 39.0–52.0)
Hemoglobin: 10.4 g/dL — ABNORMAL LOW (ref 13.0–17.0)
Immature Granulocytes: 1 %
Lymphocytes Relative: 13 %
Lymphs Abs: 0.4 10*3/uL — ABNORMAL LOW (ref 0.7–4.0)
MCH: 32.8 pg (ref 26.0–34.0)
MCHC: 32.8 g/dL (ref 30.0–36.0)
MCV: 100 fL (ref 80.0–100.0)
Monocytes Absolute: 0.8 10*3/uL (ref 0.1–1.0)
Monocytes Relative: 29 %
Neutro Abs: 1.5 10*3/uL — ABNORMAL LOW (ref 1.7–7.7)
Neutrophils Relative %: 56 %
Platelets: 57 10*3/uL — ABNORMAL LOW (ref 150–400)
RBC: 3.17 MIL/uL — ABNORMAL LOW (ref 4.22–5.81)
RDW: 12.2 % (ref 11.5–15.5)
WBC: 2.7 10*3/uL — ABNORMAL LOW (ref 4.0–10.5)
nRBC: 0 % (ref 0.0–0.2)

## 2020-11-21 LAB — BASIC METABOLIC PANEL
Anion gap: 7 (ref 5–15)
BUN: 16 mg/dL (ref 8–23)
CO2: 26 mmol/L (ref 22–32)
Calcium: 8.8 mg/dL — ABNORMAL LOW (ref 8.9–10.3)
Chloride: 106 mmol/L (ref 98–111)
Creatinine, Ser: 2.02 mg/dL — ABNORMAL HIGH (ref 0.61–1.24)
GFR, Estimated: 33 mL/min — ABNORMAL LOW (ref >60)
Glucose, Bld: 124 mg/dL — ABNORMAL HIGH (ref 70–99)
Potassium: 4.1 mmol/L (ref 3.5–5.1)
Sodium: 139 mmol/L (ref 135–145)

## 2020-11-21 LAB — MAGNESIUM: Magnesium: 1.8 mg/dL (ref 1.7–2.4)

## 2020-11-21 MED ORDER — CEPHALEXIN 500 MG PO CAPS: 500.0000 mg | ORAL_CAPSULE | Freq: Three times a day (TID) | ORAL | Status: DC

## 2020-11-21 MED ORDER — CEPHALEXIN 500 MG PO CAPS: 500.0000 mg | ORAL_CAPSULE | Freq: Three times a day (TID) | ORAL | 0 refills | Status: AC

## 2020-11-21 NOTE — Progress Notes (Signed)
Urine culture came back with pan sensitive proteus except for nitrofurantoin. Blood isolate should be the same. D/w Dr Starla Link and we will optimize abx to keflex to complete 7d of total therapy.   Onnie Boer, PharmD, BCIDP, AAHIVP, CPP Infectious Disease Pharmacist 11/21/2020 8:28 AM

## 2020-11-21 NOTE — Discharge Summary (Signed)
Physician Discharge Summary  [name] DJS:[id number] DOB: 03-Jul-1940 DOA: 11/18/2020  PCP: Milford Cage, PA  Admit date: 11/18/2020 Discharge date: 11/21/2020  Admitted From: Home Disposition: Home  Recommendations for Outpatient Follow-up:  Follow up with PCP in 1 week with repeat CBC/BMP Outpatient follow-up with urology Follow up in ED if symptoms worsen or new appear   Home Health: No Equipment/Devices: None  Discharge Condition: Stable CODE STATUS: Full Diet recommendation: Heart healthy  Brief/Interim Summary: 80 y.o. 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  His temperature was 104 with EMS.  On presentation, temperature was 100 with intermittent tachycardia, tachypnea with creatinine of 2.6 and elevated lactic acid.  Chest x-ray was negative for acute abnormality.  UA was suggestive of UTI.  CT renal stone study noted near complete resolution of previously seen right renal and ureteral stones following intervention with small nonobstructing stones noted at this time.  He was started on IV fluids and antibiotics.  Discharge Diagnoses:   Severe sepsis: Present on admission Proteus UTI: Present on admission Proteus bacteremia -Presented with fever, tachycardia, tachypnea, AKI and elevated lactic acid level with evidence of UTI -Patient has history of ESBL UTI in the past.  Initially treated with meropenem.  Blood and urine cultures grew Proteus.  Antibiotics have been changed to oral Keflex.   -Currently hemodynamically stable and tolerating diet.  Sepsis has resolved.  Discharge patient on oral Keflex for 7 more days.    Acute kidney injury on chronic kidney disease stage IIIb -Baseline creatinine around 2.  Presented with creatinine of 2.6.   Treated with IV fluids.  Creatinine 2.02 today.  Outpatient follow-up of BMP.  Acute on chronic thrombocytopenia -No evidence of bleeding.  Outpatient follow-up.  Chronic macrocytic anemia -Questionable cause.  Hemoglobin stable.  Folic acid and vitamin B12 levels normal.   Essential hypertension -Blood pressure on the higher side.  Continue metoprolol and amlodipine on discharge.  Bilateral lower extremity weakness -Possibly from sepsis/UTI.  PT recommends outpatient PT.  Chronic pain/lower back pain -Patient reports having issues with his lower back.  Previous imaging noted small anterior endplate spurs of Y8-F0 without any other significant abnormality back in 2014.  He is on pain medication of hydrocodone 10-325 mg 4 times a day as needed for -Continue home regimen.  Outpatient follow-up with PCP.  Anxiety -Continue Xanax as needed     Nephrolithiasis: CT imaging of the abdomen and pelvis noted small nonobstructing stones. -Outpatient follow-up with urology   History of prostate cancer: status post radiation treatment in 2014    Discharge Instructions  Discharge Instructions     Ambulatory referral to Physical Therapy   Complete by: As directed    Diet - low sodium heart healthy   Complete by: As directed    Increase activity slowly   Complete by: As directed       Allergies as of 11/21/2020       Reactions   Tape Other (See Comments)   Redness and blisters/paper tape-not latex   Doxycycline Hyclate Other (See Comments)   Mental changes    Sulfa Antibiotics Hives        Medication List     TAKE these medications    acetaminophen 325 MG tablet Commonly known as: TYLENOL Take 2 tablets (650 mg total) by mouth  every 6 (six) hours as needed for mild pain (or Fever >/= 101).   Alphagan P 0.1 % Soln Generic drug: brimonidine Place 1 drop into the right eye every 8 (eight) hours.   ALPRAZolam 0.5 MG tablet Commonly known as: XANAX Take 0.5 mg by mouth 2  (two) times daily as needed for anxiety or sleep.   amLODipine 10 MG tablet Commonly known as: NORVASC Take 1 tablet (10 mg total) by mouth daily. What changed: when to take this   atenolol 50 MG tablet Commonly known as: TENORMIN Take 1 tablet (50 mg total) by mouth daily. Pt needs appointment for further refills What changed: additional instructions   B COMPLEX PO Take 1 tablet by mouth daily.   cephALEXin 500 MG capsule Commonly known as: KEFLEX Take 1 capsule (500 mg total) by mouth every 8 (eight) hours for 7 days.   DULCOLAX SOFT CHEWS PO Take 1 tablet by mouth daily as needed (mild constipation).   HYDROcodone-acetaminophen 10-325 MG tablet Commonly known as: NORCO Take 1 tablet by mouth 4 (four) times daily as needed for moderate pain or severe pain.   MAALOX MAX PO Take 1 Dose by mouth daily as needed (mild constipation).   naloxone 4 MG/0.1ML Liqd nasal spray kit Commonly known as: NARCAN Place 1 spray into the nose once as needed for opioid reversal.   Rocklatan 0.02-0.005 % Soln Generic drug: Netarsudil-Latanoprost Place 1 drop into both eyes at bedtime.   Simbrinza 1-0.2 % Susp Generic drug: Brinzolamide-Brimonidine Place 1 drop into the right eye 3 (three) times daily.   sodium chloride 0.65 % Soln nasal spray Commonly known as: OCEAN Place 1 spray into both nostrils daily as needed for congestion.   timolol 0.5 % ophthalmic solution Commonly known as: TIMOPTIC Place 1 drop into both eyes daily.   VITAMIN C PO Take 1 tablet by mouth daily.        Allergies  Allergen Reactions   Tape Other (See Comments)    Redness and blisters/paper tape-not latex   Doxycycline Hyclate Other (See Comments)    Mental changes    Sulfa Antibiotics Hives    Consultations: None   Procedures/Studies: DG Chest Portable 1 View  Result Date: 11/19/2020 CLINICAL DATA:  Fevers and possible sepsis, initial encounter EXAM: PORTABLE CHEST 1 VIEW COMPARISON:   07/20/2020 FINDINGS: Cardiac shadow is within normal limits. The lungs are hypoinflated. Patchy atelectatic changes are again noted in the left base stable from the prior study. No new focal abnormality is noted. IMPRESSION: Stable left basilar atelectasis/scarring. No acute abnormality noted. Electronically Signed   By: Inez Catalina M.D.   On: 11/19/2020 02:35   CT Renal Stone Study  Result Date: 11/19/2020 CLINICAL DATA:  Dysuria and urinary incontinence, initial encounter EXAM: CT ABDOMEN AND PELVIS WITHOUT CONTRAST TECHNIQUE: Multidetector CT imaging of the abdomen and pelvis was performed following the standard protocol without IV contrast. COMPARISON:  07/30/2020 FINDINGS: Lower chest: Lung bases are free of acute infiltrate or sizable effusion. Mild scarring is seen. Hepatobiliary: Scattered hypodensities are noted throughout the liver most consistent with cysts. These were better appreciated on the prior exam. Gallbladder is within normal limits. Pancreas: Unremarkable. No pancreatic ductal dilatation or surrounding inflammatory changes. Spleen: Normal in size without focal abnormality. Adrenals/Urinary Tract: Adrenal glands are within normal limits. Right kidney is somewhat shrunken stable from prior exam. Previously seen calculi on the right are significantly reduced consistent with interval intervention. Small nonobstructing lower pole stone remains. Bladder is  partially distended. No obstructive changes are noted on the left. Small nonobstructing stone is noted in the lower pole of the left kidney new from the prior study. Stomach/Bowel: Scattered diverticular change of the colon is noted. No findings to suggest diverticulitis are seen. Mild retained fecal material is noted without evidence of constipation or obstruction. The appendix is within normal limits. Small bowel and stomach are unremarkable. Vascular/Lymphatic: Aortic atherosclerosis. No enlarged abdominal or pelvic lymph nodes.  Reproductive: Surgical clip is again noted in the prostate bed. Other: Small fat containing umbilical hernia is noted. No ascites is seen. Musculoskeletal: Degenerative changes of the lumbar spine are noted. IMPRESSION: Near complete resolution of previously seen right renal and ureteral stones following intervention. A small nonobstructing stone is noted in the lower pole measuring 2 mm. Nonobstructing lower pole left renal stone measuring 2 mm. Diverticulosis without diverticulitis. Electronically Signed   By: Inez Catalina M.D.   On: 11/19/2020 03:40      Subjective: Patient seen and examined at bedside.  Denies worsening shortness of breath, nausea, vomiting or fever.  Feels okay to go home today.  Discharge Exam: Vitals:   11/20/20 2059 11/21/20 0455  BP: 135/86 (!) 150/82  Pulse: 80 80  Resp: 18 18  Temp: 98.1 F (36.7 C) 98 F (36.7 C)  SpO2: 99% 99%    General: Pt is alert, awake, not in acute distress.  Currently on room air. Cardiovascular: rate controlled, S1/S2 + Respiratory: bilateral decreased breath sounds at bases Abdominal: Soft, NT, ND, bowel sounds + Extremities: Trace lower extremity edema; no cyanosis    The results of significant diagnostics from this hospitalization (including imaging, microbiology, ancillary and laboratory) are listed below for reference.     Microbiology: Recent Results (from the past 240 hour(s))  Urine Culture     Status: Abnormal   Collection Time: 11/18/20  7:44 PM   Specimen: Urine, Clean Catch  Result Value Ref Range Status   Specimen Description URINE, CLEAN CATCH  Final   Special Requests   Final    NONE Performed at [facility], [place] [address], [place], [place] [address]    Culture 90,000 COLONIES/mL PROTEUS MIRABILIS (A)  Final   Report Status 11/20/2020 FINAL  Final   Organism ID, Bacteria PROTEUS MIRABILIS (A)  Final      Susceptibility   Proteus mirabilis - MIC*    AMPICILLIN <=2 SENSITIVE Sensitive      CEFAZOLIN <=4 SENSITIVE Sensitive     CEFEPIME <=0.12 SENSITIVE Sensitive     CEFTRIAXONE <=0.25 SENSITIVE Sensitive     CIPROFLOXACIN <=0.25 SENSITIVE Sensitive     GENTAMICIN <=1 SENSITIVE Sensitive     IMIPENEM 0.5 SENSITIVE Sensitive     NITROFURANTOIN 128 RESISTANT Resistant     TRIMETH/SULFA <=20 SENSITIVE Sensitive     AMPICILLIN/SULBACTAM <=2 SENSITIVE Sensitive     PIP/TAZO <=4 SENSITIVE Sensitive     * 90,000 COLONIES/mL PROTEUS MIRABILIS  Resp Panel by RT-PCR (Flu A&B, Covid) Nasopharyngeal Swab     Status: None   Collection Time: 11/18/20  7:44 PM   Specimen: Nasopharyngeal Swab; Nasopharyngeal(NP) swabs in vial transport medium  Result Value Ref Range Status   SARS Coronavirus 2 by RT PCR NEGATIVE NEGATIVE Final    Comment: (NOTE) SARS-CoV-2 target nucleic acids are NOT DETECTED.  The SARS-CoV-2 RNA is generally detectable in upper respiratory specimens during the acute phase of infection. The lowest concentration of SARS-CoV-2 viral copies this assay can detect is 138  copies/mL. A negative result does not preclude SARS-Cov-2 infection and should not be used as the sole basis for treatment or other patient management decisions. A negative result may occur with  improper specimen collection/handling, submission of specimen other than nasopharyngeal swab, presence of viral mutation(s) within the areas targeted by this assay, and inadequate number of viral copies(<138 copies/mL). A negative result must be combined with clinical observations, patient history, and epidemiological information. The expected result is Negative.  Fact Sheet for Patients:  EntrepreneurPulse.com.au  Fact Sheet for Healthcare Providers:  IncredibleEmployment.be  This test is no t yet approved or cleared by the [place] FDA and  has been authorized for detection and/or diagnosis of SARS-CoV-2 by FDA under an Emergency Use Authorization (EUA). This EUA  will remain  in effect (meaning this test can be used) for the duration of the COVID-19 declaration under Section 564(b)(1) of the Act, 21 U.S.C.section 360bbb-3(b)(1), unless the authorization is terminated  or revoked sooner.       Influenza A by PCR NEGATIVE NEGATIVE Final   Influenza B by PCR NEGATIVE NEGATIVE Final    Comment: (NOTE) The Xpert Xpress SARS-CoV-2/FLU/RSV plus assay is intended as an aid in the diagnosis of influenza from Nasopharyngeal swab specimens and should not be used as a sole basis for treatment. Nasal washings and aspirates are unacceptable for Xpert Xpress SARS-CoV-2/FLU/RSV testing.  Fact Sheet for Patients: EntrepreneurPulse.com.au  Fact Sheet for Healthcare Providers: IncredibleEmployment.be  This test is not yet approved or cleared by the [place] FDA and has been authorized for detection and/or diagnosis of SARS-CoV-2 by FDA under an Emergency Use Authorization (EUA). This EUA will remain in effect (meaning this test can be used) for the duration of the COVID-19 declaration under Section 564(b)(1) of the Act, 21 U.S.C. section 360bbb-3(b)(1), unless the authorization is terminated or revoked.  Performed at [facility], [place] [address] [place], [place] [address]   Blood culture (routine x 2)     Status: None (Preliminary result)   Collection Time: 11/18/20  8:32 PM   Specimen: BLOOD RIGHT ARM  Result Value Ref Range Status   Specimen Description BLOOD RIGHT ARM  Final   Special Requests   Final    BOTTLES DRAWN AEROBIC AND ANAEROBIC Blood Culture adequate volume   Culture  Setup Time   Final    GRAM NEGATIVE RODS ANAEROBIC BOTTLE ONLY Organism ID to follow CRITICAL RESULT CALLED TO, READ BACK BY AND VERIFIED WITHJiles Garter [facility] [id number] 11/20/20 A BROWNING Performed at [facility], [place] [address] [place], [place] [address]    Culture GRAM NEGATIVE RODS  Final   Report Status  PENDING  Incomplete  Blood culture (routine x 2)     Status: None (Preliminary result)   Collection Time: 11/18/20  8:32 PM   Specimen: BLOOD LEFT ARM  Result Value Ref Range Status   Specimen Description BLOOD LEFT ARM  Final   Special Requests   Final    BOTTLES DRAWN AEROBIC ONLY Blood Culture adequate volume   Culture   Final    NO GROWTH 2 DAYS Performed at [facility], [place] [address] [place], [place] [address]    Report Status PENDING  Incomplete  Blood Culture ID Panel (Reflexed)     Status: Abnormal   Collection Time: 11/18/20  8:32 PM  Result Value Ref Range Status   Enterococcus faecalis NOT DETECTED NOT DETECTED Final   Enterococcus Faecium NOT DETECTED  NOT DETECTED Final   Listeria monocytogenes NOT DETECTED NOT DETECTED Final   Staphylococcus species NOT DETECTED NOT DETECTED Final   Staphylococcus aureus (BCID) NOT DETECTED NOT DETECTED Final   Staphylococcus epidermidis NOT DETECTED NOT DETECTED Final   Staphylococcus lugdunensis NOT DETECTED NOT DETECTED Final   Streptococcus species NOT DETECTED NOT DETECTED Final   Streptococcus agalactiae NOT DETECTED NOT DETECTED Final   Streptococcus pneumoniae NOT DETECTED NOT DETECTED Final   Streptococcus pyogenes NOT DETECTED NOT DETECTED Final   A.calcoaceticus-baumannii NOT DETECTED NOT DETECTED Final   Bacteroides fragilis NOT DETECTED NOT DETECTED Final   Enterobacterales DETECTED (A) NOT DETECTED Final    Comment: Enterobacterales represent a large order of gram negative bacteria, not a single organism. CRITICAL RESULT CALLED TO, READ BACK BY AND VERIFIED WITHJiles Garter [facility] PH[facility] [id number] 11/20/20 A BROWNING    Enterobacter cloacae complex NOT DETECTED NOT DETECTED Final   Escherichia coli NOT DETECTED NOT DETECTED Final   Klebsiella aerogenes NOT DETECTED NOT DETECTED Final   Klebsiella oxytoca NOT DETECTED NOT DETECTED Final   Klebsiella pneumoniae NOT DETECTED NOT DETECTED Final   Proteus species DETECTED  (A) NOT DETECTED Final    Comment: CRITICAL RESULT CALLED TO, READ BACK BY AND VERIFIED WITHJiles Garter [facility] PHARMD [id number] 11/20/20 A BROWNING    Salmonella species NOT DETECTED NOT DETECTED Final   Serratia marcescens NOT DETECTED NOT DETECTED Final   Haemophilus influenzae NOT DETECTED NOT DETECTED Final   Neisseria meningitidis NOT DETECTED NOT DETECTED Final   Pseudomonas aeruginosa NOT DETECTED NOT DETECTED Final   Stenotrophomonas maltophilia NOT DETECTED NOT DETECTED Final   Candida albicans NOT DETECTED NOT DETECTED Final   Candida auris NOT DETECTED NOT DETECTED Final   Candida glabrata NOT DETECTED NOT DETECTED Final   Candida krusei NOT DETECTED NOT DETECTED Final   Candida parapsilosis NOT DETECTED NOT DETECTED Final   Candida tropicalis NOT DETECTED NOT DETECTED Final   Cryptococcus neoformans/gattii NOT DETECTED NOT DETECTED Final   CTX-M ESBL NOT DETECTED NOT DETECTED Final   Carbapenem resistance IMP NOT DETECTED NOT DETECTED Final   Carbapenem resistance KPC NOT DETECTED NOT DETECTED Final   Carbapenem resistance NDM NOT DETECTED NOT DETECTED Final   Carbapenem resist OXA 48 LIKE NOT DETECTED NOT DETECTED Final   Carbapenem resistance VIM NOT DETECTED NOT DETECTED Final    Comment: Performed at [facility], [address], [place], [place] [address]     Labs: BNP (last 3 results) No results for input(s): BNP in the last 8760 hours. Basic Metabolic Panel: Recent Labs  Lab 11/18/20 2032 11/19/20 0415 11/20/20 0335 11/21/20 0315  NA 136 137 136 139  K 4.0 4.8 4.1 4.1  CL 104 104 105 106  CO2 21* 26 26 26   GLUCOSE 212* 126* 147* 124*  BUN 19 21 16 16   CREATININE 2.60* 2.57* 2.06* 2.02*  CALCIUM 9.3 8.9 9.0 8.8*  MG  --  2.4  --  1.8   Liver Function Tests: Recent Labs  Lab 11/18/20 2032 11/19/20 0415  AST 23 18  ALT 6 12  ALKPHOS 59 54  BILITOT 1.3* 0.9  PROT 7.3 6.4*  ALBUMIN 3.8 3.1*   No results for input(s): LIPASE, AMYLASE in the last 168  hours. No results for input(s): AMMONIA in the last 168 hours. CBC: Recent Labs  Lab 11/18/20 2032 11/19/20 0415 11/19/20 1300 11/21/20 0315  WBC 9.0 7.4  --  2.7*  NEUTROABS 7.6 5.4  --  1.5*  HGB 12.1* 10.7* 11.5* 10.4*  HCT 38.2* 33.0* 36.0* 31.7*  MCV 102.1* 102.8*  --  100.0  PLT 81* 62*  --  57*   Cardiac Enzymes: No results for input(s): CKTOTAL, CKMB, CKMBINDEX, TROPONINI in the last 168 hours. BNP: Invalid input(s): POCBNP CBG: No results for input(s): GLUCAP in the last 168 hours. D-Dimer Recent Labs    11/19/20 0911  DDIMER 2.29*   Hgb A1c No results for input(s): HGBA1C in the last 72 hours. Lipid Profile No results for input(s): CHOL, HDL, LDLCALC, TRIG, CHOLHDL, LDLDIRECT in the last 72 hours. Thyroid function studies Recent Labs    11/19/20 0911  TSH 1.269   Anemia work up Recent Labs    11/20/20 0335  VITAMINB12 311  FOLATE 22.3   Urinalysis    Component Value Date/Time   COLORURINE YELLOW 11/19/2020 0130   APPEARANCEUR CLOUDY (A) 11/19/2020 0130   LABSPEC 1.019 11/19/2020 0130   PHURINE 5.0 11/19/2020 0130   GLUCOSEU NEGATIVE 11/19/2020 0130   HGBUR LARGE (A) 11/19/2020 0130   BILIRUBINUR NEGATIVE 11/19/2020 0130   KETONESUR NEGATIVE 11/19/2020 0130   PROTEINUR 100 (A) 11/19/2020 0130   UROBILINOGEN 0.2 09/24/2014 1336   NITRITE NEGATIVE 11/19/2020 0130   LEUKOCYTESUR LARGE (A) 11/19/2020 0130   Sepsis Labs Invalid input(s): PROCALCITONIN,  WBC,  LACTICIDVEN Microbiology Recent Results (from the past 240 hour(s))  Urine Culture     Status: Abnormal   Collection Time: 11/18/20  7:44 PM   Specimen: Urine, Clean Catch  Result Value Ref Range Status   Specimen Description URINE, CLEAN CATCH  Final   Special Requests   Final    NONE Performed at [facility], [place] [address], [place], [place] [address]    Culture 90,000 COLONIES/mL PROTEUS MIRABILIS (A)  Final   Report Status 11/20/2020 FINAL  Final   Organism ID, Bacteria  PROTEUS MIRABILIS (A)  Final      Susceptibility   Proteus mirabilis - MIC*    AMPICILLIN <=2 SENSITIVE Sensitive     CEFAZOLIN <=4 SENSITIVE Sensitive     CEFEPIME <=0.12 SENSITIVE Sensitive     CEFTRIAXONE <=0.25 SENSITIVE Sensitive     CIPROFLOXACIN <=0.25 SENSITIVE Sensitive     GENTAMICIN <=1 SENSITIVE Sensitive     IMIPENEM 0.5 SENSITIVE Sensitive     NITROFURANTOIN 128 RESISTANT Resistant     TRIMETH/SULFA <=20 SENSITIVE Sensitive     AMPICILLIN/SULBACTAM <=2 SENSITIVE Sensitive     PIP/TAZO <=4 SENSITIVE Sensitive     * 90,000 COLONIES/mL PROTEUS MIRABILIS  Resp Panel by RT-PCR (Flu A&B, Covid) Nasopharyngeal Swab     Status: None   Collection Time: 11/18/20  7:44 PM   Specimen: Nasopharyngeal Swab; Nasopharyngeal(NP) swabs in vial transport medium  Result Value Ref Range Status   SARS Coronavirus 2 by RT PCR NEGATIVE NEGATIVE Final    Comment: (NOTE) SARS-CoV-2 target nucleic acids are NOT DETECTED.  The SARS-CoV-2 RNA is generally detectable in upper respiratory specimens during the acute phase of infection. The lowest concentration of SARS-CoV-2 viral copies this assay can detect is 138 copies/mL. A negative result does not preclude SARS-Cov-2 infection and should not be used as the sole basis for treatment or other patient management decisions. A negative result may occur with  improper specimen collection/handling, submission of specimen other than nasopharyngeal swab, presence of viral mutation(s) within the areas targeted by this assay, and inadequate number of viral copies(<138 copies/mL). A negative result must be combined with clinical  observations, patient history, and epidemiological information. The expected result is Negative.  Fact Sheet for Patients:  EntrepreneurPulse.com.au  Fact Sheet for Healthcare Providers:  IncredibleEmployment.be  This test is no t yet approved or cleared by the [place] FDA and  has  been authorized for detection and/or diagnosis of SARS-CoV-2 by FDA under an Emergency Use Authorization (EUA). This EUA will remain  in effect (meaning this test can be used) for the duration of the COVID-19 declaration under Section 564(b)(1) of the Act, 21 U.S.C.section 360bbb-3(b)(1), unless the authorization is terminated  or revoked sooner.       Influenza A by PCR NEGATIVE NEGATIVE Final   Influenza B by PCR NEGATIVE NEGATIVE Final    Comment: (NOTE) The Xpert Xpress SARS-CoV-2/FLU/RSV plus assay is intended as an aid in the diagnosis of influenza from Nasopharyngeal swab specimens and should not be used as a sole basis for treatment. Nasal washings and aspirates are unacceptable for Xpert Xpress SARS-CoV-2/FLU/RSV testing.  Fact Sheet for Patients: EntrepreneurPulse.com.au  Fact Sheet for Healthcare Providers: IncredibleEmployment.be  This test is not yet approved or cleared by the [place] FDA and has been authorized for detection and/or diagnosis of SARS-CoV-2 by FDA under an Emergency Use Authorization (EUA). This EUA will remain in effect (meaning this test can be used) for the duration of the COVID-19 declaration under Section 564(b)(1) of the Act, 21 U.S.C. section 360bbb-3(b)(1), unless the authorization is terminated or revoked.  Performed at [facility], [place] [address] [place], [place] [address]   Blood culture (routine x 2)     Status: None (Preliminary result)   Collection Time: 11/18/20  8:32 PM   Specimen: BLOOD RIGHT ARM  Result Value Ref Range Status   Specimen Description BLOOD RIGHT ARM  Final   Special Requests   Final    BOTTLES DRAWN AEROBIC AND ANAEROBIC Blood Culture adequate volume   Culture  Setup Time   Final    GRAM NEGATIVE RODS ANAEROBIC BOTTLE ONLY Organism ID to follow CRITICAL RESULT CALLED TO, READ BACK BY AND VERIFIED WITHJiles Garter [facility] [id number] 11/20/20 A BROWNING Performed at  [facility], [place] [address] [place], [place] [address]    Culture GRAM NEGATIVE RODS  Final   Report Status PENDING  Incomplete  Blood culture (routine x 2)     Status: None (Preliminary result)   Collection Time: 11/18/20  8:32 PM   Specimen: BLOOD LEFT ARM  Result Value Ref Range Status   Specimen Description BLOOD LEFT ARM  Final   Special Requests   Final    BOTTLES DRAWN AEROBIC ONLY Blood Culture adequate volume   Culture   Final    NO GROWTH 2 DAYS Performed at [facility], [place] [address], [place], [place] [address]    Report Status PENDING  Incomplete  Blood Culture ID Panel (Reflexed)     Status: Abnormal   Collection Time: 11/18/20  8:32 PM  Result Value Ref Range Status   Enterococcus faecalis NOT DETECTED NOT DETECTED Final   Enterococcus Faecium NOT DETECTED NOT DETECTED Final   Listeria monocytogenes NOT DETECTED NOT DETECTED Final   Staphylococcus species NOT DETECTED NOT DETECTED Final   Staphylococcus aureus (BCID) NOT DETECTED NOT DETECTED Final   Staphylococcus epidermidis NOT DETECTED NOT DETECTED Final   Staphylococcus lugdunensis NOT DETECTED NOT DETECTED Final   Streptococcus species NOT DETECTED NOT DETECTED Final   Streptococcus agalactiae NOT DETECTED NOT DETECTED Final  Streptococcus pneumoniae NOT DETECTED NOT DETECTED Final   Streptococcus pyogenes NOT DETECTED NOT DETECTED Final   A.calcoaceticus-baumannii NOT DETECTED NOT DETECTED Final   Bacteroides fragilis NOT DETECTED NOT DETECTED Final   Enterobacterales DETECTED (A) NOT DETECTED Final    Comment: Enterobacterales represent a large order of gram negative bacteria, not a single organism. CRITICAL RESULT CALLED TO, READ BACK BY AND VERIFIED WITHJiles Garter [facility] PHARMD 6837 11/20/20 A BROWNING    Enterobacter cloacae complex NOT DETECTED NOT DETECTED Final   Escherichia coli NOT DETECTED NOT DETECTED Final   Klebsiella aerogenes NOT DETECTED NOT DETECTED Final   Klebsiella oxytoca  NOT DETECTED NOT DETECTED Final   Klebsiella pneumoniae NOT DETECTED NOT DETECTED Final   Proteus species DETECTED (A) NOT DETECTED Final    Comment: CRITICAL RESULT CALLED TO, READ BACK BY AND VERIFIED WITHJiles Garter [facility] PHARMD 1656 11/20/20 A BROWNING    Salmonella species NOT DETECTED NOT DETECTED Final   Serratia marcescens NOT DETECTED NOT DETECTED Final   Haemophilus influenzae NOT DETECTED NOT DETECTED Final   Neisseria meningitidis NOT DETECTED NOT DETECTED Final   Pseudomonas aeruginosa NOT DETECTED NOT DETECTED Final   Stenotrophomonas maltophilia NOT DETECTED NOT DETECTED Final   Candida albicans NOT DETECTED NOT DETECTED Final   Candida auris NOT DETECTED NOT DETECTED Final   Candida glabrata NOT DETECTED NOT DETECTED Final   Candida krusei NOT DETECTED NOT DETECTED Final   Candida parapsilosis NOT DETECTED NOT DETECTED Final   Candida tropicalis NOT DETECTED NOT DETECTED Final   Cryptococcus neoformans/gattii NOT DETECTED NOT DETECTED Final   CTX-M ESBL NOT DETECTED NOT DETECTED Final   Carbapenem resistance IMP NOT DETECTED NOT DETECTED Final   Carbapenem resistance KPC NOT DETECTED NOT DETECTED Final   Carbapenem resistance NDM NOT DETECTED NOT DETECTED Final   Carbapenem resist OXA 48 LIKE NOT DETECTED NOT DETECTED Final   Carbapenem resistance VIM NOT DETECTED NOT DETECTED Final    Comment: Performed at [facility], [address] [place],  [address]     Time coordinating discharge: 35 minutes  SIGNED:   Aline August, MD  [facility] 11/21/2020, 9:10 AM

## 2020-11-21 NOTE — Plan of Care (Signed)
  Problem: Education: Goal: Knowledge of General Education information will improve Description: Including pain rating scale, medication(s)/side effects and non-pharmacologic comfort measures Outcome: Progressing   Problem: Clinical Measurements: Goal: Will remain free from infection Outcome: Progressing Goal: Diagnostic test results will improve Outcome: Progressing   Problem: Coping: Goal: Level of anxiety will decrease Outcome: Progressing   

## 2020-11-21 NOTE — Plan of Care (Signed)
  Problem: Education: Goal: Knowledge of General Education information will improve Description: Including pain rating scale, medication(s)/side effects and non-pharmacologic comfort measures 11/21/2020 1048 by Dolores Hoose, RN Outcome: Adequate for Discharge 11/21/2020 0759 by Dolores Hoose, RN Outcome: Progressing   Problem: Health Behavior/Discharge Planning: Goal: Ability to manage health-related needs will improve Outcome: Adequate for Discharge   Problem: Clinical Measurements: Goal: Ability to maintain clinical measurements within normal limits will improve Outcome: Adequate for Discharge Goal: Will remain free from infection 11/21/2020 1048 by Dolores Hoose, RN Outcome: Adequate for Discharge 11/21/2020 0759 by Dolores Hoose, RN Outcome: Progressing Goal: Diagnostic test results will improve 11/21/2020 1048 by Dolores Hoose, RN Outcome: Adequate for Discharge 11/21/2020 0759 by Dolores Hoose, RN Outcome: Progressing Goal: Respiratory complications will improve Outcome: Adequate for Discharge Goal: Cardiovascular complication will be avoided Outcome: Adequate for Discharge   Problem: Activity: Goal: Risk for activity intolerance will decrease Outcome: Adequate for Discharge   Problem: Nutrition: Goal: Adequate nutrition will be maintained Outcome: Adequate for Discharge   Problem: Coping: Goal: Level of anxiety will decrease 11/21/2020 1048 by Dolores Hoose, RN Outcome: Adequate for Discharge 11/21/2020 0759 by Dolores Hoose, RN Outcome: Progressing   Problem: Elimination: Goal: Will not experience complications related to bowel motility Outcome: Adequate for Discharge Goal: Will not experience complications related to urinary retention Outcome: Adequate for Discharge   Problem: Pain Managment: Goal: General experience of comfort will improve Outcome: Adequate for Discharge   Problem: Safety: Goal: Ability to remain free  from injury will improve Outcome: Adequate for Discharge   Problem: Skin Integrity: Goal: Risk for impaired skin integrity will decrease Outcome: Adequate for Discharge   Problem: Acute Rehab PT Goals(only PT should resolve) Goal: Patient Will Transfer Sit To/From Stand Outcome: Adequate for Discharge Goal: Pt Will Transfer Bed To Chair/Chair To Bed Outcome: Adequate for Discharge Goal: Pt Will Ambulate Outcome: Adequate for Discharge Goal: PT Additional Goal #1 Outcome: Adequate for Discharge

## 2020-11-21 NOTE — Progress Notes (Signed)
DISCHARGE NOTE HOME [name] to be discharged Home per MD order. Discussed prescriptions and follow up appointments with the patient. Prescriptions given to patient; medication list explained in detail. Patient verbalized understanding.  Skin clean, dry and intact without evidence of skin break down, no evidence of skin tears noted. IV catheter discontinued intact. Site without signs and symptoms of complications. Dressing and pressure applied. Pt denies pain at the site currently. No complaints noted.  Patient free of lines, drains, and wounds.   An After Visit Summary (AVS) was printed and given to the patient. Patient escorted via wheelchair, and discharged home via private auto.  Dolores Hoose, RN

## 2020-11-22 LAB — CULTURE, BLOOD (ROUTINE X 2): Special Requests: ADEQUATE

## 2020-11-23 LAB — CULTURE, BLOOD (ROUTINE X 2)
Culture: NO GROWTH
Special Requests: ADEQUATE

## 2021-07-09 ENCOUNTER — Telehealth: Payer: Self-pay | Admitting: Physician Assistant

## 2021-07-09 NOTE — Telephone Encounter (Signed)
Scheduled appt per 6/29 referral. Pt is aware of appt date and time. Pt is aware to arrive 15 mins prior to appt time and to bring and updated insurance card. Pt is aware of appt location.   

## 2021-08-02 NOTE — Progress Notes (Deleted)
[facility] [phone]   [phone] [phone] NOTE  Patient Care Team: Milford Cage, PA as PCP - General (Physician Assistant)  Hematological/Oncological History Labs from PCP, Jeanella Anton NP: -06/28/2021: WBC 2.5 (L), Hgb 12.8, MCV 103 (H), Plt 76 (L), Creatinine 2.1 (H), AST 21, ALT 13, Tbili 0.415.  2) 08/03/2021: Establish care with [facility]   CHIEF COMPLAINTS/PURPOSE OF CONSULTATION:  "Leukopenia, thrombocytopenia.  "  HISTORY OF PRESENTING ILLNESS:  [name] 81 y.o. male with medical history significant for ***  On review of the previous records ***  On exam today ***  MEDICAL HISTORY:  Past Medical History:  Diagnosis Date   Anemia in chronic kidney disease (CKD)    Anxiety    Arthritis    Chronic back pain    Chronic constipation    CKD (chronic kidney disease), stage III ([place])    Dyslipidemia    Eczema    Frequency of urination    GERD (gastroesophageal reflux disease)    Glaucoma, both eyes    Herniated disc    History of adenomatous polyp of colon    History of external beam radiation therapy    06-07-2012  to  08-02-2012;   prostate, 7800 cGy/40 sessions/ 5600cGy pelvic lymph nodes/40 sessions   History of kidney stones    History of prostate cancer 03/03/2012   urologist--- dr Eulogio Ditch;  dx 03/ 2014,  cT2b, Gleason 3+3;  completed IMRT 08-02-2012   History of sepsis    hx of several--- last one [phone] due to complicated UTI secondary stone obstruction   History of TIA (transient ischemic attack) 12/2008   no residual   Hydrocele, bilateral    Hypertension    Impaired memory    Legally blind in right eye, 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   Renal calculus, right    Right ureteral calculus    Urinary incontinence    Weakness of both legs    per pt wife , pt's uses cane at times   Wears dentures    upper    SURGICAL HISTORY: Past Surgical History:  Procedure Laterality  Date   BILIARY STENT PLACEMENT  06/15/2011   Procedure: BILIARY STENT PLACEMENT;  Surgeon: Franchot Gallo, MD;  Location: [facility];  Service: Urology;  Laterality: Bilateral;   CYSTO/ BILATERAL RETROGRADE PYELOGRAM/ LEFT URETERSCOPIC STONE EXTRACTION  11/14/2005   LEFT URETERAL CALCULUS   CYSTO/ BILATERAL URETERAL STENTS  11/06/2005   '@[facility]    CYSTO/ LEFT RETROGRADE PYELOGRAM/  LEFT STENT PLACMENT  05/21/2011   '@[facility]    CYSTOSCOPY W/ URETERAL STENT PLACEMENT Right 07/20/2020   Procedure: CYSTOSCOPY WITH RETROGRADE PYELOGRAM/URETERAL STENT PLACEMENT;  Surgeon: Ceasar Mons, MD;  Location: [facility];  Service: Urology;  Laterality: Right;   CYSTOSCOPY W/ URETERAL STENT REMOVAL  06/15/2011   Procedure: CYSTOSCOPY WITH STENT REMOVAL;  Surgeon: Franchot Gallo, MD;  Location: [facility];  Service: Urology;  Laterality: Left;   CYSTOSCOPY W/ URETERAL STENT REMOVAL Right 08/24/2020   Procedure: CYSTOSCOPY WITH STENT REMOVAL;  Surgeon: Franchot Gallo, MD;  Location: [facility];  Service: Urology;  Laterality: Right;   CYSTOSCOPY WITH RETROGRADE PYELOGRAM, URETEROSCOPY AND STENT PLACEMENT Right 08/24/2020   Procedure: CYSTOSCOPY WITH RETROGRADE PYELOGRAM, URETEROSCOPY, STONE EXTRACTION  AND STENT REPLACEMENT;  Surgeon: Franchot Gallo, MD;  Location: [facility];  Service: Urology;  Laterality: Right;  2 HRS   EXTRACORPOREAL SHOCK WAVE  LITHOTRIPSY     HOLMIUM LASER APPLICATION Right 0/09/3816   Procedure: HOLMIUM LASER APPLICATION;  Surgeon: Franchot Gallo, MD;  Location: [facility];  Service: Urology;  Laterality: Right;   INTERNAL URETHROTOMY/ TURP  12/23/2002   BPH W/ STRICTURE   PERCUTANEOUS NEPHROLITHOTOMY Bilateral    left 01-13-2010 and 01-29-2010;  right 11-07-2005 , 11-14-2005, and 12-09-2005   '@[facility]    REPAIR LEFT INGUINAL HERNIA W/ MESH  12/23/2002   '@[facility]    RIGHT URETERAL STENT PLACEMENT   02/10/2003   '@[facility]    RIGHT URETEROSCOPIC STONE EXTRACTION  05/29/2003   '@[facility]    URETEROSCOPY  06/15/2011   Procedure: URETEROSCOPY;  Surgeon: Franchot Gallo, MD;  Location[facility];  Service: Urology;  Laterality: Bilateral;    SOCIAL HISTORY: Social History   Socioeconomic History   Marital status: Married    Spouse name: Not on file   Number of children: Not on file   Years of education: Not on file   Highest education level: Not on file  Occupational History   Not on file  Tobacco Use   Smoking status: Former    Years: 40.00    Types: Cigarettes    Quit date: 06/10/1990    Years since quitting: 31.1   Smokeless tobacco: Never  Vaping Use   Vaping Use: Never used  Substance and Sexual Activity   Alcohol use: No   Drug use: Never   Sexual activity: Yes    Birth control/protection: None  Other Topics Concern   Not on file  Social History Narrative   Not on file   Social Determinants of Health   Financial Resource Strain: Not on file  Food Insecurity: Not on file  Transportation Needs: Not on file  Physical Activity: Not on file  Stress: Not on file  Social Connections: Not on file  Intimate Partner Violence: Not on file    FAMILY HISTORY: Family History  Problem Relation Age of Onset   Hypertension Mother    Stroke Father    Hypertension Sister    Hypertension Brother     ALLERGIES:  is allergic to tape, doxycycline hyclate, and sulfa antibiotics.  MEDICATIONS:  Current Outpatient Medications  Medication Sig Dispense Refill   acetaminophen (TYLENOL) 325 MG tablet Take 2 tablets (650 mg total) by mouth every 6 (six) hours as needed for mild pain (or Fever >/= 101).     ALPHAGAN P 0.1 % SOLN Place 1 drop into the right eye every 8 (eight) hours.     ALPRAZolam (XANAX) 0.5 MG tablet Take 0.5 mg by mouth 2 (two) times daily as needed for anxiety or sleep.     amLODipine (NORVASC) 10 MG tablet Take 1 tablet (10 mg total) by mouth daily.  (Patient taking differently: Take 10 mg by mouth at bedtime.) 90 tablet 0   Ascorbic Acid (VITAMIN C PO) Take 1 tablet by mouth daily.     atenolol (TENORMIN) 50 MG tablet Take 1 tablet (50 mg total) by mouth daily. Pt needs appointment for further refills (Patient taking differently: Take 50 mg by mouth daily.) 30 tablet 0   B Complex Vitamins (B COMPLEX PO) Take 1 tablet by mouth daily.     Calcium Carbonate-Simethicone (MAALOX MAX PO) Take 1 Dose by mouth daily as needed (mild constipation).     HYDROcodone-acetaminophen (NORCO) 10-325 MG tablet Take 1 tablet by mouth 4 (four) times daily as needed for moderate pain or severe pain.   0   Magnesium  Hydroxide (DULCOLAX SOFT CHEWS PO) Take 1 tablet by mouth daily as needed (mild constipation).     naloxone (NARCAN) nasal spray 4 mg/0.1 mL Place 1 spray into the nose once as needed for opioid reversal.     ROCKLATAN 0.02-0.005 % SOLN Place 1 drop into both eyes at bedtime.     SIMBRINZA 1-0.2 % SUSP Place 1 drop into the right eye 3 (three) times daily.     sodium chloride (OCEAN) 0.65 % SOLN nasal spray Place 1 spray into both nostrils daily as needed for congestion.     timolol (TIMOPTIC) 0.5 % ophthalmic solution Place 1 drop into both eyes daily.  0   No current facility-administered medications for this visit.    REVIEW OF SYSTEMS:   Constitutional: ( - ) fevers, ( - )  chills , ( - ) night sweats Eyes: ( - ) blurriness of vision, ( - ) double vision, ( - ) watery eyes Ears, nose, mouth, throat, and face: ( - ) mucositis, ( - ) sore throat Respiratory: ( - ) cough, ( - ) dyspnea, ( - ) wheezes Cardiovascular: ( - ) palpitation, ( - ) chest discomfort, ( - ) lower extremity swelling Gastrointestinal:  ( - ) nausea, ( - ) heartburn, ( - ) change in bowel habits Skin: ( - ) abnormal skin rashes Lymphatics: ( - ) new lymphadenopathy, ( - ) easy bruising Neurological: ( - ) numbness, ( - ) tingling, ( - ) new weaknesses Behavioral/Psych: ( -  ) mood change, ( - ) new changes  All other systems were reviewed with the patient and are negative.  PHYSICAL EXAMINATION: ECOG PERFORMANCE STATUS: {CHL ONC ECOG [phone]}  There were no vitals filed for this visit. There were no vitals filed for this visit.  GENERAL: well appearing *** in NAD  SKIN: skin color, texture, turgor are normal, no rashes or significant lesions EYES: conjunctiva are pink and non-injected, sclera clear OROPHARYNX: no exudate, no erythema; lips, buccal mucosa, and tongue normal  NECK: supple, non-tender LYMPH:  no palpable lymphadenopathy in the cervical, axillary or supraclavicular lymph nodes.  LUNGS: clear to auscultation and percussion with normal breathing effort HEART: regular rate & rhythm and no murmurs and no lower extremity edema ABDOMEN: soft, non-tender, non-distended, normal bowel sounds Musculoskeletal: no cyanosis of digits and no clubbing  PSYCH: alert & oriented x 3, fluent speech NEURO: no focal motor/sensory deficits  LABORATORY DATA:  I have reviewed the data as listed    Latest Ref Rng & Units 11/21/2020    3:15 AM 11/19/2020    1:00 PM 11/19/2020    4:15 AM  CBC  WBC 4.0 - 10.5 K/uL 2.7   7.4   Hemoglobin 13.0 - 17.0 g/dL 10.4  11.5  10.7   Hematocrit 39.0 - 52.0 % 31.7  36.0  33.0   Platelets 150 - 400 K/uL 57   62        Latest Ref Rng & Units 11/21/2020    3:15 AM 11/20/2020    3:35 AM 11/19/2020    4:15 AM  CMP  Glucose 70 - 99 mg/dL 124  147  126   BUN 8 - 23 mg/dL '16  16  21   '$ Creatinine 0.61 - 1.24 mg/dL 2.02  2.06  2.57   Sodium 135 - 145 mmol/L 139  136  137   Potassium 3.5 - 5.1 mmol/L 4.1  4.1  4.8   Chloride 98 - 111 mmol/L  106  105  104   CO2 22 - 32 mmol/L '26  26  26   '$ Calcium 8.9 - 10.3 mg/dL 8.8  9.0  8.9   Total Protein 6.5 - 8.1 g/dL   6.4   Total Bilirubin 0.3 - 1.2 mg/dL   0.9   Alkaline Phos 38 - 126 U/L   54   AST 15 - 41 U/L   18   ALT 0 - 44 U/L   12      PATHOLOGY: ***  BLOOD  FILM: *** Review of the peripheral blood smear showed normal appearing white cells with neutrophils that were appropriately lobated and granulated. There was no predominance of bi-lobed or hyper-segmented neutrophils appreciated. No Dohle bodies were noted. There was no left shifting, immature forms or blasts noted. Lymphocytes remain normal in size without any predominance of large granular lymphocytes. Red cells show no anisopoikilocytosis, macrocytes , microcytes or polychromasia. There were no schistocytes, target cells, echinocytes, acanthocytes, dacrocytes, or stomatocytes.There was no rouleaux formation, nucleated red cells, or intra-cellular inclusions noted. The platelets are normal in size, shape, and color without any clumping evident.  RADIOGRAPHIC STUDIES: I have personally reviewed the radiological images as listed and agreed with the findings in the report. No results found.  ASSESSMENT & PLAN ***  No orders of the defined types were placed in this encounter.   All questions were answered. The patient knows to call the clinic with any problems, questions or concerns.  I have spent a total of {CHL ONC TIME VISIT - [id number]} minutes of face-to-face and non-face-to-face time, preparing to see the patient, obtaining and/or reviewing separately obtained history, performing a medically appropriate examination, counseling and educating the patient, ordering medications/tests/procedures, referring and communicating with other health care professionals, documenting clinical information in the electronic health record, independently interpreting results and communicating results to the patient, and care coordination.   Dede Query, PA-C Department of Hematology/Oncology [place] [facility] Phone: [phone]

## 2021-08-03 ENCOUNTER — Inpatient Hospital Stay: Payer: Medicare PPO

## 2021-08-03 ENCOUNTER — Inpatient Hospital Stay: Payer: Medicare PPO | Attending: Physician Assistant | Admitting: Physician Assistant

## 2021-08-16 ENCOUNTER — Telehealth: Payer: Self-pay | Admitting: Physician Assistant

## 2021-08-16 NOTE — Telephone Encounter (Signed)
Called pt to r/s missed new hem appt. Pt came to the appt on the wrong day. No answer, kept getting a busy signal so unable to leave a msg.

## 2021-09-27 ENCOUNTER — Telehealth: Payer: Self-pay | Admitting: Physician Assistant

## 2021-09-27 NOTE — Telephone Encounter (Signed)
Brianna from [facility] called to r/s pt's missed new hem appt. She said she will pass appt information along to pt.

## 2021-10-20 ENCOUNTER — Inpatient Hospital Stay: Payer: Medicare PPO | Attending: Physician Assistant | Admitting: Physician Assistant

## 2021-10-20 ENCOUNTER — Encounter: Payer: Self-pay | Admitting: Physician Assistant

## 2021-10-20 ENCOUNTER — Inpatient Hospital Stay: Payer: Medicare PPO

## 2021-10-20 ENCOUNTER — Other Ambulatory Visit: Payer: Self-pay

## 2021-10-20 VITALS — BP 130/93 | HR 83 | Temp 97.9°F | Resp 18 | Ht 72.0 in | Wt 198.3 lb

## 2021-10-20 DIAGNOSIS — D72819 Decreased white blood cell count, unspecified: Secondary | ICD-10-CM | POA: Diagnosis present

## 2021-10-20 DIAGNOSIS — D696 Thrombocytopenia, unspecified: Secondary | ICD-10-CM | POA: Insufficient documentation

## 2021-10-20 LAB — CBC WITH DIFFERENTIAL (CANCER CENTER ONLY)
Abs Immature Granulocytes: 0.03 10*3/uL (ref 0.00–0.07)
Basophils Absolute: 0 10*3/uL (ref 0.0–0.1)
Basophils Relative: 0 %
Eosinophils Absolute: 0 10*3/uL (ref 0.0–0.5)
Eosinophils Relative: 0 %
HCT: 37.1 % — ABNORMAL LOW (ref 39.0–52.0)
Hemoglobin: 12.4 g/dL — ABNORMAL LOW (ref 13.0–17.0)
Immature Granulocytes: 1 %
Lymphocytes Relative: 22 %
Lymphs Abs: 0.7 10*3/uL (ref 0.7–4.0)
MCH: 33.7 pg (ref 26.0–34.0)
MCHC: 33.4 g/dL (ref 30.0–36.0)
MCV: 100.8 fL — ABNORMAL HIGH (ref 80.0–100.0)
Monocytes Absolute: 0.7 10*3/uL (ref 0.1–1.0)
Monocytes Relative: 23 %
Neutro Abs: 1.6 10*3/uL — ABNORMAL LOW (ref 1.7–7.7)
Neutrophils Relative %: 54 %
Platelet Count: 81 10*3/uL — ABNORMAL LOW (ref 150–400)
RBC: 3.68 MIL/uL — ABNORMAL LOW (ref 4.22–5.81)
RDW: 12.7 % (ref 11.5–15.5)
WBC Count: 3 10*3/uL — ABNORMAL LOW (ref 4.0–10.5)
nRBC: 0 % (ref 0.0–0.2)

## 2021-10-20 LAB — CMP (CANCER CENTER ONLY)
ALT: 11 U/L (ref 0–44)
AST: 19 U/L (ref 15–41)
Albumin: 4.2 g/dL (ref 3.5–5.0)
Alkaline Phosphatase: 57 U/L (ref 38–126)
Anion gap: 4 — ABNORMAL LOW (ref 5–15)
BUN: 14 mg/dL (ref 8–23)
CO2: 27 mmol/L (ref 22–32)
Calcium: 9.7 mg/dL (ref 8.9–10.3)
Chloride: 110 mmol/L (ref 98–111)
Creatinine: 1.82 mg/dL — ABNORMAL HIGH (ref 0.61–1.24)
GFR, Estimated: 37 mL/min — ABNORMAL LOW (ref >60)
Glucose, Bld: 94 mg/dL (ref 70–99)
Potassium: 4 mmol/L (ref 3.5–5.1)
Sodium: 141 mmol/L (ref 135–145)
Total Bilirubin: 0.5 mg/dL (ref 0.3–1.2)
Total Protein: 7 g/dL (ref 6.5–8.1)

## 2021-10-20 LAB — FOLATE: Folate: 15.6 ng/mL (ref >5.9)

## 2021-10-20 LAB — IMMATURE PLATELET FRACTION: Immature Platelet Fraction: 20.9 % — ABNORMAL HIGH (ref 1.2–8.6)

## 2021-10-20 LAB — VITAMIN B12: Vitamin B-12: 462 pg/mL (ref 180–914)

## 2021-10-20 NOTE — Progress Notes (Signed)
[place] [phone]   Fax:([phone]) [place] NOTE  Patient Care Team: Milford Cage, PA as PCP - General (Physician Assistant)  Hematological/Oncological History -06/25/2021: Labs from PCP, Jeanella Anton NP:WBC 2.5 (L), Hgb 12.8, MCV 103(H), Plt 76K (L)  -10/20/2021: Establish care with [facility].   CHIEF COMPLAINTS/PURPOSE OF CONSULTATION:  "Leukopenia and Thrombocytopenia  HISTORY OF PRESENTING ILLNESS:  [name] 81 y.o. male with medical history significant for CKD Stage III, hyperlipidemia, prostate cancer s/p radiation, GERD and TIA. He presents to the hematology clinic for evaluation for leukopenia and thrombocytopenia. He is unaccompanied for this visit.   On exam today, [name] reports his energy levels are fairly stable. He does have some fatigue but can complete his daily activities on his own. He denies any appetite changes. He denies nausea, vomiting or abdominal pain. He denies any bowel habit changes. He denies easy bruising or signs of bleeding. He denies fevers, chills, sweats, shortness of breath, chest pain or cough. He has no other complaints. Rest of the 10 point ROS is below.   MEDICAL HISTORY:  Past Medical History:  Diagnosis Date   Anemia in chronic kidney disease (CKD)    Anxiety    Arthritis    Chronic back pain    Chronic constipation    CKD (chronic kidney disease), stage III (HCC)    Dyslipidemia    Eczema    Frequency of urination    GERD (gastroesophageal reflux disease)    Glaucoma, both eyes    Herniated disc    History of adenomatous polyp of colon    History of external beam radiation therapy    06-07-2012  to  08-02-2012;   prostate, 7800 cGy/40 sessions/ 5600cGy pelvic lymph nodes/40 sessions   History of kidney stones    History of prostate cancer 03/03/2012   urologist--- dr Eulogio Ditch;  dx 03/ 2014,  cT2b, Gleason 3+3;  completed IMRT 08-02-2012   History of sepsis     hx of several--- last one [id number] due to complicated UTI secondary stone obstruction   History of TIA (transient ischemic attack) 12/2008   no residual   Hydrocele, bilateral    Hypertension    Impaired memory    Legally blind in right eye, 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   Renal calculus, right    Right ureteral calculus    Urinary incontinence    Weakness of both legs    per pt wife , pt's uses cane at times   Wears dentures    upper    SURGICAL HISTORY: Past Surgical History:  Procedure Laterality Date   BILIARY STENT PLACEMENT  06/15/2011   Procedure: BILIARY STENT PLACEMENT;  Surgeon: Franchot Gallo, MD;  Location: [facility];  Service: Urology;  Laterality: Bilateral;   CYSTO/ BILATERAL RETROGRADE PYELOGRAM/ LEFT URETERSCOPIC STONE EXTRACTION  11/14/2005   LEFT URETERAL CALCULUS   CYSTO/ BILATERAL URETERAL STENTS  11/06/2005   @[facility]    CYSTO/ LEFT RETROGRADE PYELOGRAM/  LEFT STENT PLACMENT  05/21/2011   @[facility]    CYSTOSCOPY W/ URETERAL STENT PLACEMENT Right 07/20/2020   Procedure: CYSTOSCOPY WITH RETROGRADE PYELOGRAM/URETERAL STENT PLACEMENT;  Surgeon: Ceasar Mons, MD;  Location: [facility];  Service: Urology;  Laterality: Right;   CYSTOSCOPY W/ URETERAL STENT REMOVAL  06/15/2011   Procedure: CYSTOSCOPY WITH STENT REMOVAL;  Surgeon: Franchot Gallo, MD;  Location: [facility];  Service: Urology;  Laterality: Left;  CYSTOSCOPY W/ URETERAL STENT REMOVAL Right 08/24/2020   Procedure: CYSTOSCOPY WITH STENT REMOVAL;  Surgeon: Franchot Gallo, MD;  Location: [facility];  Service: Urology;  Laterality: Right;   CYSTOSCOPY WITH RETROGRADE PYELOGRAM, URETEROSCOPY AND STENT PLACEMENT Right 08/24/2020   Procedure: CYSTOSCOPY WITH RETROGRADE PYELOGRAM, URETEROSCOPY, STONE EXTRACTION  AND STENT REPLACEMENT;  Surgeon: Franchot Gallo, MD;  Location: [facility];  Service: Urology;  Laterality: Right;  2  HRS   EXTRACORPOREAL SHOCK WAVE LITHOTRIPSY     HOLMIUM LASER APPLICATION Right 0/27/2536   Procedure: HOLMIUM LASER APPLICATION;  Surgeon: Franchot Gallo, MD;  Location: [facility];  Service: Urology;  Laterality: Right;   INTERNAL URETHROTOMY/ TURP  12/23/2002   BPH W/ STRICTURE   PERCUTANEOUS NEPHROLITHOTOMY Bilateral    left 01-13-2010 and 01-29-2010;  right 11-07-2005 , 11-14-2005, and 12-09-2005   @[facility]    REPAIR LEFT INGUINAL HERNIA W/ MESH  12/23/2002   @[facility]    RIGHT URETERAL STENT PLACEMENT  02/10/2003   @[facility]    RIGHT URETEROSCOPIC STONE EXTRACTION  05/29/2003   @[facility]    URETEROSCOPY  06/15/2011   Procedure: URETEROSCOPY;  Surgeon: Franchot Gallo, MD;  Location: [facility];  Service: Urology;  Laterality: Bilateral;    SOCIAL HISTORY: Social History   Socioeconomic History   Marital status: Married    Spouse name: Not on file   Number of children: Not on file   Years of education: Not on file   Highest education level: Not on file  Occupational History   Not on file  Tobacco Use   Smoking status: Former    Years: 40.00    Types: Cigarettes    Quit date: 06/10/1990    Years since quitting: 31.3   Smokeless tobacco: Never  Vaping Use   Vaping Use: Never used  Substance and Sexual Activity   Alcohol use: No   Drug use: Never   Sexual activity: Yes    Birth control/protection: None  Other Topics Concern   Not on file  Social History Narrative   Not on file   Social Determinants of Health   Financial Resource Strain: Not on file  Food Insecurity: Not on file  Transportation Needs: Not on file  Physical Activity: Not on file  Stress: Not on file  Social Connections: Not on file  Intimate Partner Violence: Not on file    FAMILY HISTORY: Family History  Problem Relation Age of Onset   Hypertension Mother    Stroke Father    Hypertension Sister    Lupus Sister    Hypertension Brother     ALLERGIES:  is allergic to  tape, doxycycline hyclate, and sulfa antibiotics.  MEDICATIONS:  Current Outpatient Medications  Medication Sig Dispense Refill   acetaminophen (TYLENOL) 325 MG tablet Take 2 tablets (650 mg total) by mouth every 6 (six) hours as needed for mild pain (or Fever >/= 101).     ALPHAGAN P 0.1 % SOLN Place 1 drop into the right eye every 8 (eight) hours.     ALPRAZolam (XANAX) 0.5 MG tablet Take 0.5 mg by mouth 2 (two) times daily as needed for anxiety or sleep.     amLODipine (NORVASC) 10 MG tablet Take 1 tablet (10 mg total) by mouth daily. (Patient taking differently: Take 10 mg by mouth at bedtime.) 90 tablet 0   Ascorbic Acid (VITAMIN C PO) Take 1 tablet by mouth daily.     atenolol (TENORMIN) 50 MG tablet Take 1 tablet (50 mg  total) by mouth daily. Pt needs appointment for further refills (Patient taking differently: Take 50 mg by mouth daily.) 30 tablet 0   B Complex Vitamins (B COMPLEX PO) Take 1 tablet by mouth daily.     Calcium Carbonate-Simethicone (MAALOX MAX PO) Take 1 Dose by mouth daily as needed (mild constipation).     HYDROcodone-acetaminophen (NORCO) 10-325 MG tablet Take 1 tablet by mouth 4 (four) times daily as needed for moderate pain or severe pain.   0   Magnesium Hydroxide (DULCOLAX SOFT CHEWS PO) Take 1 tablet by mouth daily as needed (mild constipation).     naloxone (NARCAN) nasal spray 4 mg/0.1 mL Place 1 spray into the nose once as needed for opioid reversal.     ROCKLATAN 0.02-0.005 % SOLN Place 1 drop into both eyes at bedtime.     SIMBRINZA 1-0.2 % SUSP Place 1 drop into the right eye 3 (three) times daily.     sodium chloride (OCEAN) 0.65 % SOLN nasal spray Place 1 spray into both nostrils daily as needed for congestion.     timolol (TIMOPTIC) 0.5 % ophthalmic solution Place 1 drop into both eyes daily.  0   No current facility-administered medications for this visit.    REVIEW OF SYSTEMS:   Constitutional: ( - ) fevers, ( - )  chills , ( - ) night sweats Eyes:  ( - ) blurriness of vision, ( - ) double vision, ( - ) watery eyes Ears, nose, mouth, throat, and face: ( - ) mucositis, ( - ) sore throat Respiratory: ( - ) cough, ( - ) dyspnea, ( - ) wheezes Cardiovascular: ( - ) palpitation, ( - ) chest discomfort, ( - ) lower extremity swelling Gastrointestinal:  ( - ) nausea, ( - ) heartburn, ( - ) change in bowel habits Skin: ( - ) abnormal skin rashes Lymphatics: ( - ) new lymphadenopathy, ( - ) easy bruising Neurological: ( - ) numbness, ( - ) tingling, ( - ) new weaknesses Behavioral/Psych: ( - ) mood change, ( - ) new changes  All other systems were reviewed with the patient and are negative.  PHYSICAL EXAMINATION: ECOG PERFORMANCE STATUS: 1 - Symptomatic but completely ambulatory  Vitals:   10/20/21 1111  BP: (!) 130/93  Pulse: 83  Resp: 18  Temp: 97.9 F (36.6 C)  SpO2: 99%   Filed Weights   10/20/21 1111  Weight: 198 lb 4.8 oz (89.9 kg)    GENERAL: well appearing male in NAD  SKIN: skin color, texture, turgor are normal, no rashes or significant lesions EYES: conjunctiva are pink and non-injected, sclera clear OROPHARYNX: no exudate, no erythema; lips, buccal mucosa, and tongue normal  NECK: supple, non-tender LYMPH:  no palpable lymphadenopathy in the cervical or supraclavicular lymph nodes.  LUNGS: clear to auscultation and percussion with normal breathing effort HEART: regular rate & rhythm and no murmurs and no lower extremity edema ABDOMEN: soft, non-tender, non-distended, normal bowel sounds Musculoskeletal: no cyanosis of digits and no clubbing  PSYCH: alert & oriented x 3, fluent speech NEURO: no focal motor/sensory deficits  LABORATORY DATA:  I have reviewed the data as listed    Latest Ref Rng & Units 11/21/2020    3:15 AM 11/19/2020    1:00 PM 11/19/2020    4:15 AM  CBC  WBC 4.0 - 10.5 K/uL 2.7   7.4   Hemoglobin 13.0 - 17.0 g/dL 10.4  11.5  10.7   Hematocrit 39.0 - 52.0 %  31.7  36.0  33.0   Platelets 150 -  400 K/uL 57   62        Latest Ref Rng & Units 11/21/2020    3:15 AM 11/20/2020    3:35 AM 11/19/2020    4:15 AM  CMP  Glucose 70 - 99 mg/dL 124  147  126   BUN 8 - 23 mg/dL 16  16  21    Creatinine 0.61 - 1.24 mg/dL 2.02  2.06  2.57   Sodium 135 - 145 mmol/L 139  136  137   Potassium 3.5 - 5.1 mmol/L 4.1  4.1  4.8   Chloride 98 - 111 mmol/L 106  105  104   CO2 22 - 32 mmol/L 26  26  26    Calcium 8.9 - 10.3 mg/dL 8.8  9.0  8.9   Total Protein 6.5 - 8.1 g/dL   6.4   Total Bilirubin 0.3 - 1.2 mg/dL   0.9   Alkaline Phos 38 - 126 U/L   54   AST 15 - 41 U/L   18   ALT 0 - 44 U/L   12     ASSESSMENT & PLAN [name] is a 81 y.o. male who presents to the clinic for for evaluation for leukopenia and thrombocytopenia. We reviewed possible etiologies including nutritional deficiencies, liver disease, splenomegaly, infectious processes, immune mediated and bone marrow disorders. Patient will proceed with laboratory evaluation today to evaluate for underlying cause.    #Thrombocytopenia/Leukopenia: --Labs today to check CBC w/diff, CMP, B12 level, folate level, Hep B and C serologies, HIV serology, copper level, immature platelet fraction, SPEP with IFE and serum free light chains. Marland Kitchen --If above workup is negative, we will evaluate for liver disease and/or splenomegaly with abdominal US --RTC based on above workup.    No orders of the defined types were placed in this encounter.   All questions were answered. The patient knows to call the clinic with any problems, questions or concerns.  I have spent a total of 60 minutes minutes of face-to-face and non-face-to-face time, preparing to see the patient, performing a medically appropriate examination, counseling and educating the patient, ordering tests/procedures,  documenting clinical information in the electronic health record, and care coordination.   Dede Query, PA-C Department of Hematology/Oncology [place]  at [facility] Phone: [phone]  Patient was seen with Dr. Lorenso Courier  I have read the above note and personally examined the patient. I agree with the assessment and plan as noted above.  Briefly [name] is a 81 year old male who presents for evaluation of leukopenia and thrombocytopenia.  We will conduct a full work-up to include hepatitis serologies, nutritional labs, as well as ruling out a monoclonal gammopathy.  Patient is not having any issues with recurrent infections or bleeding at this time.  If no clear etiology can be found we will need to pursue a bone marrow biopsy.  He voices understanding of our work-up as noted above.   Ledell Peoples, MD Department of Hematology/Oncology [place] at [facility] Phone: [phone] Pager: [phone] Email: [email or web address]

## 2021-10-21 LAB — HIV ANTIBODY (ROUTINE TESTING W REFLEX): HIV Screen 4th Generation wRfx: NONREACTIVE

## 2021-10-21 LAB — HEPATITIS B SURFACE ANTIGEN

## 2021-10-22 LAB — HEPATITIS B SURFACE ANTIBODY,QUALITATIVE

## 2021-10-22 LAB — METHYLMALONIC ACID, SERUM: Methylmalonic Acid, Quantitative: 234 nmol/L (ref 0–378)

## 2021-10-22 LAB — HEPATITIS B CORE ANTIBODY, TOTAL

## 2021-10-22 LAB — KAPPA/LAMBDA LIGHT CHAINS
Kappa free light chain: 41.7 mg/L — ABNORMAL HIGH (ref 3.3–19.4)
Kappa, lambda light chain ratio: 2.02 — ABNORMAL HIGH (ref 0.26–1.65)
Lambda free light chains: 20.6 mg/L (ref 5.7–26.3)

## 2021-10-22 LAB — HEPATITIS C ANTIBODY: HCV Ab: NONREACTIVE

## 2021-10-26 LAB — MULTIPLE MYELOMA PANEL, SERUM
Albumin SerPl Elph-Mcnc: 4 g/dL (ref 2.9–4.4)
Albumin/Glob SerPl: 1.4 (ref 0.7–1.7)
Alpha 1: 0.3 g/dL (ref 0.0–0.4)
Alpha2 Glob SerPl Elph-Mcnc: 0.8 g/dL (ref 0.4–1.0)
B-Globulin SerPl Elph-Mcnc: 1 g/dL (ref 0.7–1.3)
Gamma Glob SerPl Elph-Mcnc: 0.9 g/dL (ref 0.4–1.8)
Globulin, Total: 3 g/dL (ref 2.2–3.9)
IgA: 268 mg/dL (ref 61–437)
IgG (Immunoglobin G), Serum: 804 mg/dL (ref 603–1613)
IgM (Immunoglobulin M), Srm: 39 mg/dL (ref 15–143)
Total Protein ELP: 7 g/dL (ref 6.0–8.5)

## 2021-10-28 LAB — COPPER, SERUM: Copper: 96 ug/dL (ref 69–132)

## 2021-11-02 ENCOUNTER — Telehealth: Payer: Self-pay | Admitting: Physician Assistant

## 2021-11-02 DIAGNOSIS — D72819 Decreased white blood cell count, unspecified: Secondary | ICD-10-CM

## 2021-11-02 DIAGNOSIS — D539 Nutritional anemia, unspecified: Secondary | ICD-10-CM

## 2021-11-02 DIAGNOSIS — D696 Thrombocytopenia, unspecified: Secondary | ICD-10-CM

## 2021-11-02 NOTE — Telephone Encounter (Signed)
I spoke to patient's spouse, Lin Glazier, to review the lab results from 10/20/2021.  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  Remaining work-up did not confirm underlying cause.  Recommendation is to proceed with bone marrow biopsy to further evaluate for underlying bone marrow disorder.  Patient and his wife expressed understanding of the plan provided.  We will follow-up with the patient after biopsy results are available for review.

## 2021-11-22 ENCOUNTER — Other Ambulatory Visit: Payer: Self-pay

## 2021-11-22 ENCOUNTER — Other Ambulatory Visit: Payer: Self-pay | Admitting: Internal Medicine

## 2021-11-22 ENCOUNTER — Emergency Department (HOSPITAL_COMMUNITY): Payer: Medicare PPO

## 2021-11-22 ENCOUNTER — Emergency Department (HOSPITAL_COMMUNITY)
Admission: EM | Admit: 2021-11-22 | Discharge: 2021-11-22 | Disposition: A | Discharge status: Home / Self Care | Payer: Medicare PPO | Attending: Emergency Medicine | Admitting: Emergency Medicine

## 2021-11-22 DIAGNOSIS — Z960 Presence of urogenital implants: Secondary | ICD-10-CM | POA: Diagnosis not present

## 2021-11-22 DIAGNOSIS — Z978 Presence of other specified devices: Secondary | ICD-10-CM

## 2021-11-22 DIAGNOSIS — D61818 Other pancytopenia: Secondary | ICD-10-CM

## 2021-11-22 DIAGNOSIS — Z79899 Other long term (current) drug therapy: Secondary | ICD-10-CM | POA: Insufficient documentation

## 2021-11-22 DIAGNOSIS — R300 Dysuria: Secondary | ICD-10-CM | POA: Diagnosis not present

## 2021-11-22 DIAGNOSIS — Z8546 Personal history of malignant neoplasm of prostate: Secondary | ICD-10-CM | POA: Insufficient documentation

## 2021-11-22 DIAGNOSIS — R339 Retention of urine, unspecified: Secondary | ICD-10-CM | POA: Insufficient documentation

## 2021-11-22 LAB — COMPREHENSIVE METABOLIC PANEL
ALT: 14 U/L (ref 0–44)
AST: 32 U/L (ref 15–41)
Albumin: 4.1 g/dL (ref 3.5–5.0)
Alkaline Phosphatase: 48 U/L (ref 38–126)
Anion gap: 9 (ref 5–15)
BUN: 17 mg/dL (ref 8–23)
CO2: 22 mmol/L (ref 22–32)
Calcium: 9.3 mg/dL (ref 8.9–10.3)
Chloride: 109 mmol/L (ref 98–111)
Creatinine, Ser: 1.94 mg/dL — ABNORMAL HIGH (ref 0.61–1.24)
GFR, Estimated: 34 mL/min — ABNORMAL LOW (ref >60)
Glucose, Bld: 120 mg/dL — ABNORMAL HIGH (ref 70–99)
Potassium: 5 mmol/L (ref 3.5–5.1)
Sodium: 140 mmol/L (ref 135–145)
Total Bilirubin: 1.2 mg/dL (ref 0.3–1.2)
Total Protein: 6.9 g/dL (ref 6.5–8.1)

## 2021-11-22 LAB — CBC WITH DIFFERENTIAL/PLATELET
Abs Immature Granulocytes: 0.04 10*3/uL (ref 0.00–0.07)
Basophils Absolute: 0 10*3/uL (ref 0.0–0.1)
Basophils Relative: 0 %
Eosinophils Absolute: 0 10*3/uL (ref 0.0–0.5)
Eosinophils Relative: 0 %
HCT: 38.5 % — ABNORMAL LOW (ref 39.0–52.0)
Hemoglobin: 12.5 g/dL — ABNORMAL LOW (ref 13.0–17.0)
Immature Granulocytes: 1 %
Lymphocytes Relative: 15 %
Lymphs Abs: 0.7 10*3/uL (ref 0.7–4.0)
MCH: 33.3 pg (ref 26.0–34.0)
MCHC: 32.5 g/dL (ref 30.0–36.0)
MCV: 102.7 fL — ABNORMAL HIGH (ref 80.0–100.0)
Monocytes Absolute: 0.6 10*3/uL (ref 0.1–1.0)
Monocytes Relative: 12 %
Neutro Abs: 3.3 10*3/uL (ref 1.7–7.7)
Neutrophils Relative %: 72 %
Platelets: 67 10*3/uL — ABNORMAL LOW (ref 150–400)
RBC: 3.75 MIL/uL — ABNORMAL LOW (ref 4.22–5.81)
RDW: 12.5 % (ref 11.5–15.5)
Smear Review: DECREASED
WBC: 4.6 10*3/uL (ref 4.0–10.5)
nRBC: 0 % (ref 0.0–0.2)

## 2021-11-22 LAB — URINALYSIS, ROUTINE W REFLEX MICROSCOPIC
Bacteria, UA: NONE SEEN
Bilirubin Urine: NEGATIVE
Glucose, UA: NEGATIVE mg/dL
Ketones, ur: NEGATIVE mg/dL
Leukocytes,Ua: NEGATIVE
Nitrite: NEGATIVE
Protein, ur: 30 mg/dL — AB
RBC / HPF: >50 RBC/hpf — ABNORMAL HIGH (ref 0–5)
Specific Gravity, Urine: 1.015 (ref 1.005–1.030)
pH: 6 (ref 5.0–8.0)

## 2021-11-22 LAB — LIPASE, BLOOD: Lipase: 56 U/L — ABNORMAL HIGH (ref 11–51)

## 2021-11-22 MED ORDER — ONDANSETRON HCL 4 MG/2ML IJ SOLN
4.0000 mg | Freq: Once | INTRAMUSCULAR | Status: AC
  Administered 2021-11-22: 4 mg via INTRAVENOUS
  Filled 2021-11-22: qty 2

## 2021-11-22 MED ORDER — MORPHINE SULFATE (PF) 4 MG/ML IV SOLN
4.0000 mg | Freq: Once | INTRAVENOUS | Status: AC
  Administered 2021-11-22: 4 mg via INTRAVENOUS
  Filled 2021-11-22: qty 1

## 2021-11-22 MED ORDER — CEPHALEXIN 500 MG PO CAPS: 500.0000 mg | ORAL_CAPSULE | Freq: Two times a day (BID) | ORAL | 0 refills | Status: AC

## 2021-11-22 MED ORDER — LIDOCAINE HCL URETHRAL/MUCOSAL 2 % EX GEL
1.0000 | Freq: Once | CUTANEOUS | Status: AC
  Administered 2021-11-22: 1 via TOPICAL
  Filled 2021-11-22: qty 11

## 2021-11-22 MED ORDER — MORPHINE SULFATE (PF) 2 MG/ML IV SOLN
2.0000 mg | Freq: Once | INTRAVENOUS | Status: AC
  Administered 2021-11-22: 2 mg via INTRAVENOUS
  Filled 2021-11-22: qty 1

## 2021-11-22 MED ORDER — IOHEXOL 350 MG/ML SOLN
75.0000 mL | Freq: Once | INTRAVENOUS | Status: AC | PRN
  Administered 2021-11-22: 75 mL via INTRAVENOUS

## 2021-11-22 MED ORDER — SODIUM CHLORIDE 0.9 % IV SOLN
2.0000 g | Freq: Once | INTRAVENOUS | Status: AC
  Administered 2021-11-22: 2 g via INTRAVENOUS
  Filled 2021-11-22: qty 20

## 2021-11-22 MED ORDER — SODIUM CHLORIDE 0.9 % IV BOLUS
1000.0000 mL | Freq: Once | INTRAVENOUS | Status: AC
  Administered 2021-11-22: 1000 mL via INTRAVENOUS

## 2021-11-22 NOTE — ED Provider Notes (Addendum)
  Provider Note MRN:  [id number]  Arrival date & time: 11/22/21    ED Course and Medical Decision Making  Assumed care from Dr Tyrone Nine at shift change.  See note from prior team for complete details, in brief:  81 yo male Can't urinate since 0200 Unable to pass coudee Coudee team/MD unsuccessful Dr winter to eval (uro) Dr winter has eval the pt, foley catheter placed Functioning well, pt feeling much better, suprapubic pressure greatly improved Will give leg bag Uro started on keflex, will see in the office for further eval   The patient improved significantly and was discharged in stable condition. Detailed discussions were had with the patient regarding current findings, and need for close f/u with PCP or on call doctor. The patient has been instructed to return immediately if the symptoms worsen in any way for re-evaluation. Patient verbalized understanding and is in agreement with current care plan. All questions answered prior to discharge.     Procedures  Final Clinical Impressions(s) / ED Diagnoses     ICD-10-CM   1. Urinary retention  R33.9     2. Foley catheter in place  Z97.8       ED Discharge Orders          Ordered    cephALEXin (KEFLEX) 500 MG capsule  2 times daily        11/22/21 1600              Discharge Instructions      It was a pleasure caring for you today in the emergency department.  Please return to the emergency department for any worsening or worrisome symptoms.  Please call urology office to schedule follow up         Jeanell Sparrow, DO 11/22/21 [place], [place], DO 11/22/21 [place]

## 2021-11-22 NOTE — ED Triage Notes (Signed)
Patient here with complaint of urinary retention, reports history of prostate cancer and surgery. Last urinated at 0200 this morning, patient states he is usually incontinent but has not had any urine in his brief since that time. Patient is alert, oriented, and in no apparent distress at this time. Denies pain except for when he feels like he needs to urinate.   Bladder scan in triage result: 245 mL.

## 2021-11-22 NOTE — ED Notes (Signed)
2 Rns from coude team attempted insertion, unsuccessful. MD informed.

## 2021-11-22 NOTE — ED Provider Notes (Signed)
[facility] EMERGENCY DEPARTMENT Provider Note   CSN: [id number] Arrival date & time: 11/22/21  0815     History  Chief Complaint  Patient presents with   Urinary Retention    [name] is a 81 y.o. male.  43 yoM with a chief complaint of difficulty urinating.  This has been going on since about 2 AM.  He said he was able to urinate then and since then felt the need to urinate and has been unable.  He has a history of urinary tension in the past.        Home Medications Prior to Admission medications   Medication Sig Start Date End Date Taking? Authorizing Provider  acetaminophen (TYLENOL) 325 MG tablet Take 2 tablets (650 mg total) by mouth every 6 (six) hours as needed for mild pain (or Fever >/= 101). 11/03/19   Nita Sells, MD  ALPHAGAN P 0.1 % SOLN Place 1 drop into the right eye every 8 (eight) hours. 09/25/19   [provider]  ALPRAZolam Duanne Moron) 0.5 MG tablet Take 0.5 mg by mouth 2 (two) times daily as needed for anxiety or sleep. 10/01/19   [provider]  amLODipine (NORVASC) 10 MG tablet Take 1 tablet (10 mg total) by mouth daily. Patient taking differently: Take 10 mg by mouth at bedtime. 03/16/17   Jaynee Eagles, PA-C  Ascorbic Acid (VITAMIN C PO) Take 1 tablet by mouth daily.    [provider]  atenolol (TENORMIN) 50 MG tablet Take 1 tablet (50 mg total) by mouth daily. Pt needs appointment for further refills Patient taking differently: Take 50 mg by mouth daily. 10/22/17   Forrest Moron, MD  B Complex Vitamins (B COMPLEX PO) Take 1 tablet by mouth daily.    [provider]  Calcium Carbonate-Simethicone (MAALOX MAX PO) Take 1 Dose by mouth daily as needed (mild constipation).    [provider]  HYDROcodone-acetaminophen (NORCO) 10-325 MG tablet Take 1 tablet by mouth 4 (four) times daily as needed for moderate pain or severe pain.  02/12/14   [provider]  Magnesium  Hydroxide (DULCOLAX SOFT CHEWS PO) Take 1 tablet by mouth daily as needed (mild constipation).    [provider]  naloxone [facility]) nasal spray 4 mg/0.1 mL Place 1 spray into the nose once as needed for opioid reversal. 08/26/20   [provider]  ROCKLATAN 0.02-0.005 % SOLN Place 1 drop into both eyes at bedtime. 09/24/19   [provider]  SIMBRINZA 1-0.2 % SUSP Place 1 drop into the right eye 3 (three) times daily. 08/28/20   [provider]  sodium chloride (OCEAN) 0.65 % SOLN nasal spray Place 1 spray into both nostrils daily as needed for congestion.    [provider]  timolol (TIMOPTIC) 0.5 % ophthalmic solution Place 1 drop into both eyes daily. 01/11/17   [provider]      Allergies    Tape, Doxycycline hyclate, and Sulfa antibiotics    Review of Systems   Review of Systems  Physical Exam Updated Vital Signs BP (!) 175/107   Pulse 91   Temp 98 F (36.7 C)   Resp 20   SpO2 99%  Physical Exam Vitals and nursing note reviewed.  Constitutional:      Appearance: He is well-developed.  HENT:     Head: Normocephalic and atraumatic.  Eyes:     Pupils: Pupils are equal, round, and reactive to light.  Neck:  Vascular: No JVD.  Cardiovascular:     Rate and Rhythm: Normal rate and regular rhythm.     Heart sounds: No murmur heard.    No friction rub. No gallop.  Pulmonary:     Effort: No respiratory distress.     Breath sounds: No wheezing.  Abdominal:     General: There is no distension.     Tenderness: There is no abdominal tenderness. There is no guarding or rebound.  Musculoskeletal:        General: Normal range of motion.     Cervical back: Normal range of motion and neck supple.  Skin:    Coloration: Skin is not pale.     Findings: No rash.  Neurological:     Mental Status: He is alert and oriented to person, place, and time.  Psychiatric:        Behavior: Behavior normal.     ED Results / Procedures /  Treatments   Labs (all labs ordered are listed, but only abnormal results are displayed) Labs Reviewed  CBC WITH DIFFERENTIAL/PLATELET - Abnormal; Notable for the following components:      Result Value   RBC 3.75 (*)    Hemoglobin 12.5 (*)    HCT 38.5 (*)    MCV 102.7 (*)    Platelets 67 (*)    All other components within normal limits  COMPREHENSIVE METABOLIC PANEL - Abnormal; Notable for the following components:   Glucose, Bld 120 (*)    Creatinine, Ser 1.94 (*)    GFR, Estimated 34 (*)    All other components within normal limits  LIPASE, BLOOD - Abnormal; Notable for the following components:   Lipase 56 (*)    All other components within normal limits  URINALYSIS, ROUTINE W REFLEX MICROSCOPIC    EKG None  Radiology CT ABDOMEN PELVIS W CONTRAST  Result Date: 11/22/2021 CLINICAL DATA:  Abdominal pain, acute, nonlocalized. Urinary retention. History of prostate cancer. EXAM: CT ABDOMEN AND PELVIS WITH CONTRAST TECHNIQUE: Multidetector CT imaging of the abdomen and pelvis was performed using the standard protocol following bolus administration of intravenous contrast. RADIATION DOSE REDUCTION: This exam was performed according to the departmental dose-optimization program which includes automated exposure control, adjustment of the mA and/or kV according to patient size and/or use of iterative reconstruction technique. CONTRAST:  45m OMNIPAQUE IOHEXOL 350 MG/ML SOLN COMPARISON:  CT abdomen and pelvis 11/19/2020 FINDINGS: Lower chest: Mild atelectasis in the lung bases. No pleural effusion. Hepatobiliary: Similar appearance of multiple hypodense liver lesions including the largest lesion which measures 2.6 cm, most compatible with cysts although many lesions are subcentimeter in size and too small to fully characterize. Unremarkable gallbladder. No biliary dilatation. Pancreas: Unremarkable. Spleen: Unremarkable. Adrenals/Urinary Tract: Unremarkable adrenal glands. New moderate  bilateral perinephric stranding and moderate bilateral hydroureteronephrosis. No renal or ureteral calculi. Moderately distended bladder with surrounding stranding. Stomach/Bowel: The stomach is unremarkable. There is left-sided colonic diverticulosis. There is no evidence bowel obstruction or inflammation. The appendix is unremarkable. Vascular/Lymphatic: Abdominal aortic atherosclerosis without aneurysm. No enlarged lymph nodes. Reproductive: Brachytherapy seed or fiducial in the prostate bed on the right. Large left hydrocele. Right testicle in the distal aspect of the right inguinal canal. Other: Unchanged small to moderate-sized fat-containing supraumbilical hernia. Small fat-containing right inguinal hernia. No ascites or pneumoperitoneum. Musculoskeletal: No acute osseous abnormality or suspicious osseous lesion. IMPRESSION: 1. Moderate bilateral hydroureteronephrosis and moderate bladder distension with stranding about both kidneys and the bladder. Correlate with urinalysis for signs of urinary  tract infection. No calculi. 2. Large left hydrocele. 3. Colonic diverticulosis. 4.  Aortic Atherosclerosis (ICD10-I70.0). Electronically Signed   By: Logan Bores M.D.   On: 11/22/2021 13:27    Procedures Procedures    Medications Ordered in ED Medications  lidocaine (XYLOCAINE) 2 % jelly 1 Application (1 Application Topical Given 11/22/21 0921)  morphine (PF) 2 MG/ML injection 2 mg (2 mg Intravenous Given 11/22/21 1033)  ondansetron (ZOFRAN) injection 4 mg (4 mg Intravenous Given 11/22/21 1033)  sodium chloride 0.9 % bolus 1,000 mL (0 mLs Intravenous Stopped 11/22/21 1239)  lidocaine (XYLOCAINE) 2 % jelly 1 Application (1 Application Topical Given 11/22/21 1039)  morphine (PF) 4 MG/ML injection 4 mg (4 mg Intravenous Given 11/22/21 1121)  ondansetron (ZOFRAN) injection 4 mg (4 mg Intravenous Given 11/22/21 1119)  morphine (PF) 4 MG/ML injection 4 mg (4 mg Intravenous Given 11/22/21 1302)  iohexol  (OMNIPAQUE) 350 MG/ML injection 75 mL (75 mLs Intravenous Contrast Given 11/22/21 1307)    ED Course/ Medical Decision Making/ A&P                           Medical Decision Making Amount and/or Complexity of Data Reviewed Labs: ordered. Radiology: ordered.  Risk Prescription drug management.   81 yo M with a significant past medical history of prostate cancer comes in with a chief complaints of difficulty urinating.  Tells me that he is incontinent of urine and this morning realized that he had less urine output than normal.  Was having some discomfort suprapubic region.  No fevers no flank pain.  No hematuria.  245 on bladder scan.  Patient is quite uncomfortable and so discussed risks and benefits with the patient who is electing for catheter placement at this time.  We will reassess.  Unable to place foley by nursing and myself..  Called coude team.    Will obtain blood work, CT.   Blood work without concerning finding.  CT scan with bladder distention.  Coud team unable to place Foley as well.  I discussed the case with Dr. Lovena Neighbours will come evaluate the patient at bedside for Foley placement.  Signed out to Dr. Pearline Cables, please see their note for further details of care in the ED.  The patients results and plan were reviewed and discussed.   Any x-rays performed were independently reviewed by myself.   Differential diagnosis were considered with the presenting HPI.  Medications  lidocaine (XYLOCAINE) 2 % jelly 1 Application (1 Application Topical Given 11/22/21 0921)  morphine (PF) 2 MG/ML injection 2 mg (2 mg Intravenous Given 11/22/21 1033)  ondansetron (ZOFRAN) injection 4 mg (4 mg Intravenous Given 11/22/21 1033)  sodium chloride 0.9 % bolus 1,000 mL (0 mLs Intravenous Stopped 11/22/21 1239)  lidocaine (XYLOCAINE) 2 % jelly 1 Application (1 Application Topical Given 11/22/21 1039)  morphine (PF) 4 MG/ML injection 4 mg (4 mg Intravenous Given 11/22/21 1121)  ondansetron  (ZOFRAN) injection 4 mg (4 mg Intravenous Given 11/22/21 1119)  morphine (PF) 4 MG/ML injection 4 mg (4 mg Intravenous Given 11/22/21 1302)  iohexol (OMNIPAQUE) 350 MG/ML injection 75 mL (75 mLs Intravenous Contrast Given 11/22/21 1307)    Vitals:   11/22/21 1311 11/22/21 1400 11/22/21 1430 11/22/21 1500  BP: (!) 189/114 (!) 187/122 (!) 190/107 (!) 175/107  Pulse: 91 87 84 91  Resp: 20  20   Temp:      TempSrc:      SpO2: 100% 96% 100%  99%    Final diagnoses:  Urinary retention            Final Clinical Impression(s) / ED Diagnoses Final diagnoses:  Urinary retention    Rx / DC Orders ED Discharge Orders     None         Deno Etienne, DO 11/22/21 1525

## 2021-11-22 NOTE — Progress Notes (Signed)
RN was called to insert caude cath. Unsuccessful. Notified RN.

## 2021-11-22 NOTE — ED Notes (Signed)
Additional coude foley insertions attempted, unsuccessful.

## 2021-11-22 NOTE — Discharge Instructions (Signed)
It was a pleasure caring for you today in the emergency department.  Please return to the emergency department for any worsening or worrisome symptoms.  Please call urology office to schedule follow up

## 2021-11-22 NOTE — ED Notes (Signed)
Attempted foley insertion x2, unsuccessful urine return. Dr. Tyrone Nine informed and agrees to come attempt.

## 2021-11-22 NOTE — ED Notes (Signed)
Pt educated on and set up with leg bag prior to DC.

## 2021-11-22 NOTE — Consult Note (Signed)
Urology Consult   Physician requesting consult: Deno Etienne, DO  Reason for consult: Urinary retention   History of Present Illness: [name] is a 81 y.o. male with a history of prostate cancer, status post IMRT in 2014 (His PSA from August 2023 was <0.015).  The patient is currently being followed by Dr. Diona Fanti and has recently been evaluated over the past several weeks for worsening lower urinary tract symptoms.  The patient presents to the emergency room after a 24-hour history of worsening lower abdominal pain and decreased urine output.  The patient admits that he is incontinent at baseline and has to wear several pads per day.  The emergency room physician ordered a CT scan, which revealed a distended bladder.  Multiple attempts were made by the nursing staff and ED physician to place a Foley catheter, but were unsuccessful.  Urology has been consulted for difficult catheter placement in this patient with acute urinary retention.      Past Medical History:  Diagnosis Date   Anemia in chronic kidney disease (CKD)    Anxiety    Arthritis    Chronic back pain    Chronic constipation    CKD (chronic kidney disease), stage III (HCC)    Dyslipidemia    Eczema    Frequency of urination    GERD (gastroesophageal reflux disease)    Glaucoma, both eyes    Herniated disc    History of adenomatous polyp of colon    History of external beam radiation therapy    06-07-2012  to  08-02-2012;   prostate, 7800 cGy/40 sessions/ 5600cGy pelvic lymph nodes/40 sessions   History of kidney stones    History of prostate cancer 03/03/2012   urologist--- dr Eulogio Ditch;  dx 03/ 2014,  cT2b, Gleason 3+3;  completed IMRT 08-02-2012   History of sepsis    hx of several--- last one [id number] due to complicated UTI secondary stone obstruction   History of TIA (transient ischemic attack) 12/2008   no residual   Hydrocele, bilateral    Hypertension    Impaired memory    Legally blind in right eye,  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   Renal calculus, right    Right ureteral calculus    Urinary incontinence    Weakness of both legs    per pt wife , pt's uses cane at times   Wears dentures    upper    Past Surgical History:  Procedure Laterality Date   BILIARY STENT PLACEMENT  06/15/2011   Procedure: BILIARY STENT PLACEMENT;  Surgeon: Franchot Gallo, MD;  Location: [facility];  Service: Urology;  Laterality: Bilateral;   CYSTO/ BILATERAL RETROGRADE PYELOGRAM/ LEFT URETERSCOPIC STONE EXTRACTION  11/14/2005   LEFT URETERAL CALCULUS   CYSTO/ BILATERAL URETERAL STENTS  11/06/2005   _0    CYSTO/ LEFT RETROGRADE PYELOGRAM/  LEFT STENT PLACMENT  05/21/2011   _1    CYSTOSCOPY W/ URETERAL STENT PLACEMENT Right 07/20/2020   Procedure: CYSTOSCOPY WITH RETROGRADE PYELOGRAM/URETERAL STENT PLACEMENT;  Surgeon: Ceasar Mons, MD;  Location: [facility];  Service: Urology;  Laterality: Right;   CYSTOSCOPY W/ URETERAL STENT REMOVAL  06/15/2011   Procedure: CYSTOSCOPY WITH STENT REMOVAL;  Surgeon: Franchot Gallo, MD;  Location: [facility];  Service: Urology;  Laterality: Left;   CYSTOSCOPY W/ URETERAL STENT REMOVAL Right 08/24/2020   Procedure: CYSTOSCOPY WITH STENT REMOVAL;  Surgeon: Franchot Gallo, MD;  Location: [facility];  Service: Urology;  Laterality: Right;   CYSTOSCOPY WITH RETROGRADE PYELOGRAM, URETEROSCOPY AND STENT PLACEMENT Right 08/24/2020   Procedure: CYSTOSCOPY WITH RETROGRADE PYELOGRAM, URETEROSCOPY, STONE EXTRACTION  AND STENT REPLACEMENT;  Surgeon: Franchot Gallo, MD;  Location: [facility];  Service: Urology;  Laterality: Right;  2 HRS   EXTRACORPOREAL SHOCK WAVE LITHOTRIPSY     HOLMIUM LASER APPLICATION Right 04/27/9561   Procedure: HOLMIUM LASER APPLICATION;  Surgeon: Franchot Gallo, MD;  Location: [facility];  Service: Urology;  Laterality: Right;   INTERNAL URETHROTOMY/  TURP  12/23/2002   BPH W/ STRICTURE   PERCUTANEOUS NEPHROLITHOTOMY Bilateral    left 01-13-2010 and 01-29-2010;  right 11-07-2005 , 11-14-2005, and 12-09-2005   _0    REPAIR LEFT INGUINAL HERNIA W/ MESH  12/23/2002   _1    RIGHT URETERAL STENT PLACEMENT  02/10/2003   _2    RIGHT URETEROSCOPIC STONE EXTRACTION  05/29/2003   _3    URETEROSCOPY  06/15/2011   Procedure: URETEROSCOPY;  Surgeon: Franchot Gallo, MD;  Location: [facility];  Service: Urology;  Laterality: Bilateral;    Current Hospital Medications:  Home Meds:  Current Meds  Medication Sig   cephALEXin (KEFLEX) 500 MG capsule Take 1 capsule (500 mg total) by mouth 2 (two) times daily for 14 days.    Scheduled Meds: Continuous Infusions: PRN Meds:.  Allergies:  Allergies  Allergen Reactions   Tape Other (See Comments)    Redness and blisters/paper tape-not latex   Doxycycline Hyclate Other (See Comments)    Mental changes    Sulfa Antibiotics Hives    Family History  Problem Relation Age of Onset   Hypertension Mother    Stroke Father    Hypertension Sister    Lupus Sister    Hypertension Brother     Social History:  reports that he quit smoking about 31 years ago. His smoking use included cigarettes. He has never used smokeless tobacco. He reports that he does not drink alcohol and does not use drugs.  ROS: A complete review of systems was performed.  All systems are negative except for pertinent findings as noted.  Physical Exam:  Vital signs in last 24 hours: Temp:  [97.4 F (36.3 C)-98.2 F (36.8 C)] 98.2 F (36.8 C) (11/20 1641) Pulse Rate:  [77-96] 85 (11/20 1800) Resp:  [16-21] 18 (11/20 1644) BP: (155-223)/(88-138) 160/113 (11/20 1800) SpO2:  [94 %-100 %] 98 % (11/20 1800) Constitutional:  Alert and oriented, No acute distress Cardiovascular: Regular rate and rhythm, No JVD Respiratory: Normal respiratory effort, Lungs clear bilaterally GI: Abdomen is soft,  nontender, nondistended, no abdominal masses GU: The penis is uncircumcised with no blood at the urethral meatus  Laboratory Data:  Recent Labs    11/22/21 1030  WBC 4.6  HGB 12.5*  HCT 38.5*  PLT 67*    Recent Labs    11/22/21 1030  NA 140  K 5.0  CL 109  GLUCOSE 120*  BUN 17  CALCIUM 9.3  CREATININE 1.94*     Results for orders placed or performed during the hospital encounter of 11/22/21 (from the past 24 hour(s))  CBC with Differential     Status: Abnormal   Collection Time: 11/22/21 10:30 AM  Result Value Ref Range   WBC 4.6 4.0 - 10.5 K/uL   RBC 3.75 (L) 4.22 - 5.81 MIL/uL   Hemoglobin 12.5 (L) 13.0 - 17.0 g/dL   HCT 38.5 (L) 39.0 - 52.0 %   MCV 102.7 (H) 80.0 - 100.0 fL  MCH 33.3 26.0 - 34.0 pg   MCHC 32.5 30.0 - 36.0 g/dL   RDW 12.5 11.5 - 15.5 %   Platelets 67 (L) 150 - 400 K/uL   nRBC 0.0 0.0 - 0.2 %   Neutrophils Relative % 72 %   Neutro Abs 3.3 1.7 - 7.7 K/uL   Lymphocytes Relative 15 %   Lymphs Abs 0.7 0.7 - 4.0 K/uL   Monocytes Relative 12 %   Monocytes Absolute 0.6 0.1 - 1.0 K/uL   Eosinophils Relative 0 %   Eosinophils Absolute 0.0 0.0 - 0.5 K/uL   Basophils Relative 0 %   Basophils Absolute 0.0 0.0 - 0.1 K/uL   WBC Morphology MORPHOLOGY UNREMARKABLE    RBC Morphology MORPHOLOGY UNREMARKABLE    Smear Review PLATELETS APPEAR DECREASED    Immature Granulocytes 1 %   Abs Immature Granulocytes 0.04 0.00 - 0.07 K/uL  Comprehensive metabolic panel     Status: Abnormal   Collection Time: 11/22/21 10:30 AM  Result Value Ref Range   Sodium 140 135 - 145 mmol/L   Potassium 5.0 3.5 - 5.1 mmol/L   Chloride 109 98 - 111 mmol/L   CO2 22 22 - 32 mmol/L   Glucose, Bld 120 (H) 70 - 99 mg/dL   BUN 17 8 - 23 mg/dL   Creatinine, Ser 1.94 (H) 0.61 - 1.24 mg/dL   Calcium 9.3 8.9 - 10.3 mg/dL   Total Protein 6.9 6.5 - 8.1 g/dL   Albumin 4.1 3.5 - 5.0 g/dL   AST 32 15 - 41 U/L   ALT 14 0 - 44 U/L   Alkaline Phosphatase 48 38 - 126 U/L   Total Bilirubin  1.2 0.3 - 1.2 mg/dL   GFR, Estimated 34 (L) >60 mL/min   Anion gap 9 5 - 15  Lipase, blood     Status: Abnormal   Collection Time: 11/22/21 10:30 AM  Result Value Ref Range   Lipase 56 (H) 11 - 51 U/L  Urinalysis, Routine w reflex microscopic Urine, Catheterized     Status: Abnormal   Collection Time: 11/22/21  4:15 PM  Result Value Ref Range   Color, Urine YELLOW YELLOW   APPearance CLEAR CLEAR   Specific Gravity, Urine 1.015 1.005 - 1.030   pH 6.0 5.0 - 8.0   Glucose, UA NEGATIVE NEGATIVE mg/dL   Hgb urine dipstick MODERATE (A) NEGATIVE   Bilirubin Urine NEGATIVE NEGATIVE   Ketones, ur NEGATIVE NEGATIVE mg/dL   Protein, ur 30 (A) NEGATIVE mg/dL   Nitrite NEGATIVE NEGATIVE   Leukocytes,Ua NEGATIVE NEGATIVE   RBC / HPF >50 (H) 0 - 5 RBC/hpf   WBC, UA 0-5 0 - 5 WBC/hpf   Bacteria, UA NONE SEEN NONE SEEN   Mucus PRESENT    No results found for this or any previous visit (from the past 240 hour(s)).  Renal Function: Recent Labs    11/22/21 1030  CREATININE 1.94*   CrCl cannot be calculated (Unknown ideal weight.).  Radiologic Imaging: CT ABDOMEN PELVIS W CONTRAST  Result Date: 11/22/2021 CLINICAL DATA:  Abdominal pain, acute, nonlocalized. Urinary retention. History of prostate cancer. EXAM: CT ABDOMEN AND PELVIS WITH CONTRAST TECHNIQUE: Multidetector CT imaging of the abdomen and pelvis was performed using the standard protocol following bolus administration of intravenous contrast. RADIATION DOSE REDUCTION: This exam was performed according to the departmental dose-optimization program which includes automated exposure control, adjustment of the mA and/or kV according to patient size and/or use of iterative reconstruction technique. CONTRAST:  4m OMNIPAQUE IOHEXOL 350 MG/ML SOLN COMPARISON:  CT abdomen and pelvis 11/19/2020 FINDINGS: Lower chest: Mild atelectasis in the lung bases. No pleural effusion. Hepatobiliary: Similar appearance of multiple hypodense liver lesions  including the largest lesion which measures 2.6 cm, most compatible with cysts although many lesions are subcentimeter in size and too small to fully characterize. Unremarkable gallbladder. No biliary dilatation. Pancreas: Unremarkable. Spleen: Unremarkable. Adrenals/Urinary Tract: Unremarkable adrenal glands. New moderate bilateral perinephric stranding and moderate bilateral hydroureteronephrosis. No renal or ureteral calculi. Moderately distended bladder with surrounding stranding. Stomach/Bowel: The stomach is unremarkable. There is left-sided colonic diverticulosis. There is no evidence bowel obstruction or inflammation. The appendix is unremarkable. Vascular/Lymphatic: Abdominal aortic atherosclerosis without aneurysm. No enlarged lymph nodes. Reproductive: Brachytherapy seed or fiducial in the prostate bed on the right. Large left hydrocele. Right testicle in the distal aspect of the right inguinal canal. Other: Unchanged small to moderate-sized fat-containing supraumbilical hernia. Small fat-containing right inguinal hernia. No ascites or pneumoperitoneum. Musculoskeletal: No acute osseous abnormality or suspicious osseous lesion. IMPRESSION: 1. Moderate bilateral hydroureteronephrosis and moderate bladder distension with stranding about both kidneys and the bladder. Correlate with urinalysis for signs of urinary tract infection. No calculi. 2. Large left hydrocele. 3. Colonic diverticulosis. 4.  Aortic Atherosclerosis (ICD10-I70.0). Electronically Signed   By: ALogan BoresM.D.   On: 11/22/2021 13:27    I independently reviewed the above imaging studies.  Procedures: Cystoscopy with Amplatz dilation of urethral stricture and difficult Foley catheter placement  After verbal consent was obtained, the patient's genitalia was prepped and draped in the usual sterile fashion.  He received a 4 mg IV infusion of morphine during the procedure.  Inserted a 139French flexible cystoscope into the urethral meatus  and was advanced into the bulbar urethra where a pinpoint stricture was identified.  A sensor wire was then advanced through the aperture of the stricture and into the bladder with return of clear urine.  An Amplatz dilator kit was then used to progressively dilate the urethral stricture, starting at 147 Frenchand progressing up to 16 [place](and 2 FP[place]increments).  A 12 FP[place]ouncil tip catheter was then advanced over the wire and into good position within the bladder with return of clear urine.  A urine specimen was collected for culture.  The catheter balloon was then inflated with 10 mL of sterile water and placed to gravity drainage.  The patient tolerated the procedure well.  He received a postprocedure infusion of Rocephin 2 g IV.  Impression/Recommendation 1.  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  Urine culture is pending.  I will start him on an empiric course of Keflex and arrange follow-up as an outpatient with Dr. DDiona Fantifor further management.  CEllison Hughs MD [facility] 11/22/2021, 6:10 PM

## 2021-11-23 ENCOUNTER — Ambulatory Visit (HOSPITAL_COMMUNITY): Payer: Medicare PPO

## 2021-11-23 ENCOUNTER — Ambulatory Visit (HOSPITAL_COMMUNITY): Admission: RE | Admit: 2021-11-23 | Payer: Medicare PPO | Source: Ambulatory Visit

## 2021-11-24 LAB — URINE CULTURE: Culture: NO GROWTH

## 2021-12-09 ENCOUNTER — Telehealth: Payer: Self-pay

## 2021-12-09 NOTE — Telephone Encounter (Signed)
T/C to pt to follow up on cancelled BMBX. Pt stated he had been to the ED and had a foley placed with complications. The Dr advised him to wait until later to have the [place]. He said he would like to wait until after the first of the year to reschedule.  IR scheduling advised.

## 2021-12-10 ENCOUNTER — Other Ambulatory Visit: Payer: Self-pay | Admitting: Urology

## 2021-12-14 ENCOUNTER — Encounter (HOSPITAL_BASED_OUTPATIENT_CLINIC_OR_DEPARTMENT_OTHER): Payer: Self-pay | Admitting: Urology

## 2021-12-14 NOTE — Progress Notes (Addendum)
Spoke w/ via phone for pre-op interview--- pt's wife, [name] needs dos----  [employer], ekg             Barnes results------ no COVID test -----patient states asymptomatic no test needed Arrive at ------- 1130 on 12-16-2021 NPO after MN NO Solid Food.  Clear liquids from MN until--- 1030 Med rec completed Medications to take morning of surgery ----- atenolol, eye drops as usual,  may take norco/ xanax if needed Diabetic medication ----- n/a Patient instructed no nail polish to be worn day of surgery Patient instructed to bring photo id and insurance card day of surgery Patient aware to have Driver (ride ) / caregiver    for 24 hours after surgery -- wife, Cameron Patient Special Instructions -----  n/a Pre-Op special Istructions -----  pt is legally blind / impaired memory and needs help with medications , wife to be with him in pre-op Patient verbalized understanding of instructions that were given at this phone interview. Patient denies shortness of breath, chest pain, fever, cough at this phone interview.

## 2021-12-16 ENCOUNTER — Ambulatory Visit (HOSPITAL_BASED_OUTPATIENT_CLINIC_OR_DEPARTMENT_OTHER): Payer: Medicare PPO | Admitting: Anesthesiology

## 2021-12-16 ENCOUNTER — Ambulatory Visit (HOSPITAL_BASED_OUTPATIENT_CLINIC_OR_DEPARTMENT_OTHER)
Admission: RE | Admit: 2021-12-16 | Discharge: 2021-12-16 | Disposition: A | Discharge status: Home / Self Care | Payer: Medicare PPO | Attending: Urology | Admitting: Urology

## 2021-12-16 ENCOUNTER — Encounter (HOSPITAL_BASED_OUTPATIENT_CLINIC_OR_DEPARTMENT_OTHER)
Admission: RE | Disposition: A | Discharge status: Home / Self Care | Payer: Self-pay | Source: Home / Self Care | Attending: Urology

## 2021-12-16 ENCOUNTER — Encounter (HOSPITAL_BASED_OUTPATIENT_CLINIC_OR_DEPARTMENT_OTHER): Payer: Self-pay | Admitting: Urology

## 2021-12-16 DIAGNOSIS — I10 Essential (primary) hypertension: Secondary | ICD-10-CM | POA: Diagnosis not present

## 2021-12-16 DIAGNOSIS — I129 Hypertensive chronic kidney disease with stage 1 through stage 4 chronic kidney disease, or unspecified chronic kidney disease: Secondary | ICD-10-CM | POA: Diagnosis not present

## 2021-12-16 DIAGNOSIS — Z01818 Encounter for other preprocedural examination: Secondary | ICD-10-CM

## 2021-12-16 DIAGNOSIS — N35919 Unspecified urethral stricture, male, unspecified site: Secondary | ICD-10-CM

## 2021-12-16 DIAGNOSIS — Y842 Radiological procedure and radiotherapy as the cause of abnormal reaction of the patient, or of later complication, without mention of misadventure at the time of the procedure: Secondary | ICD-10-CM | POA: Insufficient documentation

## 2021-12-16 DIAGNOSIS — D759 Disease of blood and blood-forming organs, unspecified: Secondary | ICD-10-CM | POA: Insufficient documentation

## 2021-12-16 DIAGNOSIS — N183 Chronic kidney disease, stage 3 unspecified: Secondary | ICD-10-CM | POA: Diagnosis not present

## 2021-12-16 DIAGNOSIS — Z87891 Personal history of nicotine dependence: Secondary | ICD-10-CM

## 2021-12-16 DIAGNOSIS — Z8546 Personal history of malignant neoplasm of prostate: Secondary | ICD-10-CM | POA: Diagnosis not present

## 2021-12-16 DIAGNOSIS — F419 Anxiety disorder, unspecified: Secondary | ICD-10-CM | POA: Diagnosis not present

## 2021-12-16 DIAGNOSIS — N35011 Post-traumatic bulbous urethral stricture: Secondary | ICD-10-CM

## 2021-12-16 DIAGNOSIS — Z923 Personal history of irradiation: Secondary | ICD-10-CM | POA: Insufficient documentation

## 2021-12-16 DIAGNOSIS — N99111 Postprocedural bulbous urethral stricture: Secondary | ICD-10-CM | POA: Diagnosis not present

## 2021-12-16 DIAGNOSIS — D649 Anemia, unspecified: Secondary | ICD-10-CM | POA: Diagnosis not present

## 2021-12-16 DIAGNOSIS — Z8673 Personal history of transient ischemic attack (TIA), and cerebral infarction without residual deficits: Secondary | ICD-10-CM | POA: Diagnosis not present

## 2021-12-16 HISTORY — DX: Thrombocytopenia, unspecified: D69.6

## 2021-12-16 HISTORY — DX: Retention of urine, unspecified: R33.9

## 2021-12-16 HISTORY — DX: Decreased white blood cell count, unspecified: D72.819

## 2021-12-16 HISTORY — DX: Presence of other specified devices: Z97.8

## 2021-12-16 HISTORY — PX: CYSTOSCOPY WITH URETHRAL DILATATION: SHX5125

## 2021-12-16 LAB — POCT I-STAT, CHEM 8
BUN: 21 mg/dL (ref 8–23)
BUN: 21 mg/dL (ref 8–23)
Calcium, Ion: 1.27 mmol/L (ref 1.15–1.40)
Calcium, Ion: 1.27 mmol/L (ref 1.15–1.40)
Chloride: 107 mmol/L (ref 98–111)
Chloride: 107 mmol/L (ref 98–111)
Creatinine, Ser: 1.7 mg/dL — ABNORMAL HIGH (ref 0.61–1.24)
Creatinine, Ser: 1.7 mg/dL — ABNORMAL HIGH (ref 0.61–1.24)
Glucose, Bld: 111 mg/dL — ABNORMAL HIGH (ref 70–99)
Glucose, Bld: 111 mg/dL — ABNORMAL HIGH (ref 70–99)
HCT: 39 % (ref 39.0–52.0)
HCT: 39 % (ref 39.0–52.0)
Hemoglobin: 13.3 g/dL (ref 13.0–17.0)
Hemoglobin: 13.3 g/dL (ref 13.0–17.0)
Potassium: 4.8 mmol/L (ref 3.5–5.1)
Potassium: 4.8 mmol/L (ref 3.5–5.1)
Sodium: 141 mmol/L (ref 135–145)
Sodium: 141 mmol/L (ref 135–145)
TCO2: 24 mmol/L (ref 22–32)
TCO2: 24 mmol/L (ref 22–32)

## 2021-12-16 SURGERY — CYSTOSCOPY, WITH URETHRAL DILATION
Anesthesia: General | Site: Urethra

## 2021-12-16 MED ORDER — PHENYLEPHRINE 80 MCG/ML (10ML) SYRINGE FOR IV PUSH (FOR BLOOD PRESSURE SUPPORT)
PREFILLED_SYRINGE | INTRAVENOUS | Status: DC | PRN
  Administered 2021-12-16 (×2): 80 ug via INTRAVENOUS

## 2021-12-16 MED ORDER — LIDOCAINE 2% (20 MG/ML) 5 ML SYRINGE
INTRAMUSCULAR | Status: DC | PRN
  Administered 2021-12-16: 60 mg via INTRAVENOUS

## 2021-12-16 MED ORDER — ONDANSETRON HCL 4 MG/2ML IJ SOLN: 4.0000 mg | Freq: Once | INTRAMUSCULAR | Status: DC | PRN

## 2021-12-16 MED ORDER — FENTANYL CITRATE (PF) 100 MCG/2ML IJ SOLN
INTRAMUSCULAR | Status: DC | PRN
  Administered 2021-12-16: 25 ug via INTRAVENOUS
  Administered 2021-12-16: 50 ug via INTRAVENOUS
  Administered 2021-12-16: 25 ug via INTRAVENOUS

## 2021-12-16 MED ORDER — PHENYLEPHRINE 80 MCG/ML (10ML) SYRINGE FOR IV PUSH (FOR BLOOD PRESSURE SUPPORT)
PREFILLED_SYRINGE | INTRAVENOUS | Status: AC
  Filled 2021-12-16: qty 10

## 2021-12-16 MED ORDER — EPHEDRINE SULFATE-NACL 50-0.9 MG/10ML-% IV SOSY
PREFILLED_SYRINGE | INTRAVENOUS | Status: DC | PRN
  Administered 2021-12-16: 5 mg via INTRAVENOUS

## 2021-12-16 MED ORDER — PROPOFOL 10 MG/ML IV BOLUS
INTRAVENOUS | Status: DC | PRN
  Administered 2021-12-16: 120 mg via INTRAVENOUS

## 2021-12-16 MED ORDER — CEFAZOLIN SODIUM-DEXTROSE 2-4 GM/100ML-% IV SOLN
2.0000 g | INTRAVENOUS | Status: AC
  Administered 2021-12-16: 2 g via INTRAVENOUS

## 2021-12-16 MED ORDER — FENTANYL CITRATE (PF) 100 MCG/2ML IJ SOLN
INTRAMUSCULAR | Status: AC
  Filled 2021-12-16: qty 2

## 2021-12-16 MED ORDER — ONDANSETRON HCL 4 MG/2ML IJ SOLN
INTRAMUSCULAR | Status: DC | PRN
  Administered 2021-12-16: 4 mg via INTRAVENOUS

## 2021-12-16 MED ORDER — SODIUM CHLORIDE 0.9 % IR SOLN
Status: DC | PRN
  Administered 2021-12-16: 1500 mL

## 2021-12-16 MED ORDER — EPHEDRINE 5 MG/ML INJ
INTRAVENOUS | Status: AC
  Filled 2021-12-16: qty 5

## 2021-12-16 MED ORDER — PROPOFOL 10 MG/ML IV BOLUS
INTRAVENOUS | Status: AC
  Filled 2021-12-16: qty 20

## 2021-12-16 MED ORDER — SODIUM CHLORIDE 0.9 % IV SOLN: INTRAVENOUS | Status: DC

## 2021-12-16 MED ORDER — OXYCODONE HCL 5 MG PO TABS: 5.0000 mg | ORAL_TABLET | Freq: Once | ORAL | Status: DC | PRN

## 2021-12-16 MED ORDER — LIDOCAINE HCL (PF) 2 % IJ SOLN
INTRAMUSCULAR | Status: AC
  Filled 2021-12-16: qty 5

## 2021-12-16 MED ORDER — ONDANSETRON HCL 4 MG/2ML IJ SOLN
INTRAMUSCULAR | Status: AC
  Filled 2021-12-16: qty 2

## 2021-12-16 MED ORDER — STERILE WATER FOR IRRIGATION IR SOLN
Status: DC | PRN
  Administered 2021-12-16: 500 mL

## 2021-12-16 MED ORDER — CEFAZOLIN SODIUM-DEXTROSE 2-4 GM/100ML-% IV SOLN
INTRAVENOUS | Status: AC
  Filled 2021-12-16: qty 100

## 2021-12-16 MED ORDER — FENTANYL CITRATE (PF) 100 MCG/2ML IJ SOLN: 25.0000 ug | INTRAMUSCULAR | Status: DC | PRN

## 2021-12-16 MED ORDER — OXYCODONE HCL 5 MG/5ML PO SOLN: 5.0000 mg | Freq: Once | ORAL | Status: DC | PRN

## 2021-12-16 SURGICAL SUPPLY — 31 items
BAG DRAIN URO-CYSTO SKYTR STRL (DRAIN) ×2 IMPLANT
BAG DRN RND TRDRP ANRFLXCHMBR (UROLOGICAL SUPPLIES) ×1
BAG DRN UROCATH (DRAIN) ×1
BAG URINE DRAIN 2000ML AR STRL (UROLOGICAL SUPPLIES) IMPLANT
BAG URINE LEG 500ML (DRAIN) IMPLANT
BALLN NEPHROSTOMY (BALLOONS)
BALLN OPTILUME DCB 30X3X75 (BALLOONS)
BALLN OPTILUME DCB 30X5X75 (BALLOONS) ×1
BALLOON NEPHROSTOMY (BALLOONS) IMPLANT
BALLOON OPTILUME DCB 30X3X75 (BALLOONS) IMPLANT
BALLOON OPTILUME DCB 30X5X75 (BALLOONS) IMPLANT
CATH FOLEY 2WAY SLVR  5CC 16FR (CATHETERS) ×1
CATH FOLEY 2WAY SLVR  5CC 20FR (CATHETERS)
CATH FOLEY 2WAY SLVR  5CC 22FR (CATHETERS)
CATH FOLEY 2WAY SLVR 5CC 16FR (CATHETERS) IMPLANT
CATH FOLEY 2WAY SLVR 5CC 20FR (CATHETERS) IMPLANT
CATH FOLEY 2WAY SLVR 5CC 22FR (CATHETERS) IMPLANT
CATH URETL OPEN END 6FR 70 (CATHETERS) IMPLANT
CLOTH BEACON ORANGE TIMEOUT ST (SAFETY) ×4 IMPLANT
GLOVE BIO SURGEON STRL SZ8 (GLOVE) ×2 IMPLANT
GOWN STRL REUS W/TWL XL LVL3 (GOWN DISPOSABLE) ×4 IMPLANT
GUIDEWIRE ANG ZIPWIRE 038X150 (WIRE) IMPLANT
GUIDEWIRE STR DUAL SENSOR (WIRE) IMPLANT
HOLDER FOLEY CATH W/STRAP (MISCELLANEOUS) IMPLANT
IV NS IRRIG 3000ML ARTHROMATIC (IV SOLUTION) IMPLANT
KIT TURNOVER CYSTO (KITS) ×2 IMPLANT
MANIFOLD NEPTUNE II (INSTRUMENTS) ×2 IMPLANT
NS IRRIG 500ML POUR BTL (IV SOLUTION) IMPLANT
PACK CYSTO (CUSTOM PROCEDURE TRAY) ×2 IMPLANT
TUBE CONNECTING 12X1/4 (SUCTIONS) IMPLANT
WATER STERILE IRR 3000ML UROMA (IV SOLUTION) ×2 IMPLANT

## 2021-12-16 NOTE — Anesthesia Procedure Notes (Signed)
Procedure Name: LMA Insertion Date/Time: 12/16/2021 1:59 PM  Performed by: Mechele Claude, CRNAPre-anesthesia Checklist: Patient identified, Emergency Drugs available, Suction available and Patient being monitored Patient Re-evaluated:Patient Re-evaluated prior to induction Oxygen Delivery Method: Circle system utilized Preoxygenation: Pre-oxygenation with 100% oxygen Induction Type: IV induction Ventilation: Mask ventilation without difficulty LMA: LMA inserted LMA Size: 5.0 Number of attempts: 1 Airway Equipment and Method: Bite block Placement Confirmation: positive ETCO2 Tube secured with: Tape Dental Injury: Teeth and Oropharynx as per pre-operative assessment

## 2021-12-16 NOTE — Anesthesia Postprocedure Evaluation (Signed)
Anesthesia Post Note  Patient: [name]  Procedure(s) Performed: CYSTOSCOPY WITH  OPTILUME URETHRAL DILATATION (Urethra)     Patient location during evaluation: PACU Anesthesia Type: General Level of consciousness: awake and alert and oriented Pain management: pain level controlled Vital Signs Assessment: post-procedure vital signs reviewed and stable Respiratory status: spontaneous breathing, nonlabored ventilation and respiratory function stable Cardiovascular status: blood pressure returned to baseline and stable Postop Assessment: no apparent nausea or vomiting Anesthetic complications: no   No notable events documented.  Last Vitals:  Vitals:   12/16/21 1441 12/16/21 1445  BP: (!) 144/83 (!) 144/83  Pulse: 61 (!) 57  Resp:  10  Temp: 36.6 C   SpO2: 97% 97%    Last Pain:  Vitals:   12/16/21 1441  TempSrc:   PainSc: Asleep                 Cicley Ganesh A.

## 2021-12-16 NOTE — Anesthesia Preprocedure Evaluation (Addendum)
Anesthesia Evaluation  Patient identified by MRN, date of birth, ID band Patient awake    Reviewed: Allergy & Precautions, NPO status , Patient's Chart, lab work & pertinent test results, reviewed documented beta blocker date and time   Airway Mallampati: I  TM Distance: >3 FB Neck ROM: Full    Dental  (+) Edentulous Upper, Edentulous Lower   Pulmonary former smoker   Pulmonary exam normal breath sounds clear to auscultation       Cardiovascular hypertension, Pt. on medications and Pt. on home beta blockers Normal cardiovascular exam Rhythm:Regular Rate:Normal     Neuro/Psych   Anxiety     Glaucoma Legally blind OD TIA   GI/Hepatic Neg liver ROS,GERD  Medicated,,  Endo/Other  Hyperlipidemia  Renal/GU Renal InsufficiencyRenal diseaseHx/o renal calculi   Urethral stricture Urinary incontinence Hx/o Prostate Ca S/P external beam radiation    Musculoskeletal  (+) Arthritis , Osteoarthritis,    Abdominal   Peds  Hematology  (+) Blood dyscrasia, anemia Hx/o thrombocytopenia- plt 67k 11/22/21   Anesthesia Other Findings   Reproductive/Obstetrics                              Anesthesia Physical Anesthesia Plan  ASA: 3  Anesthesia Plan: General   Post-op Pain Management: Minimal or no pain anticipated   Induction: Intravenous  PONV Risk Score and Plan: 4 or greater and Treatment may vary due to age or medical condition, Ondansetron and Dexamethasone  Airway Management Planned: LMA  Additional Equipment: None  Intra-op Plan:   Post-operative Plan: Extubation in OR  Informed Consent: I have reviewed the patients History and Physical, chart, labs and discussed the procedure including the risks, benefits and alternatives for the proposed anesthesia with the patient or authorized representative who has indicated his/her understanding and acceptance.       Plan Discussed with:  Anesthesiologist and CRNA  Anesthesia Plan Comments:          Anesthesia Quick Evaluation

## 2021-12-16 NOTE — Transfer of Care (Signed)
Immediate Anesthesia Transfer of Care Note  Patient: JEMARI HALLUM  Procedure(s) Performed: Procedure(s) (LRB): CYSTOSCOPY WITH  OPTILUME URETHRAL DILATATION (N/A)  Patient Location: PACU  Anesthesia Type: General  Level of Consciousness: awake, alert  and oriented  Airway & Oxygen Therapy: Patient Spontanous Breathing  Post-op Assessment: Report given to PACU RN and Post -op Vital signs reviewed and stable  Post vital signs: Reviewed and stable  Complications: No apparent anesthesia complications  Last Vitals:  Vitals Value Taken Time  BP 144/83 12/16/21 1445  Temp 36.6 C 12/16/21 1441  Pulse 57 12/16/21 1449  Resp 8 12/16/21 1449  SpO2 98 % 12/16/21 1449  Vitals shown include unvalidated device data.  Last Pain:  Vitals:   12/16/21 1441  TempSrc:   PainSc: Asleep      Patients Stated Pain Goal: 3 (48/27/07 8675)  Complications: No notable events documented.

## 2021-12-16 NOTE — H&P (Signed)
H&P  Chief Complaint: Urethral stricture  History of Present Illness: 81 year old male with longstanding urological issues including history of prostate cancer.  He completed IMRT in 2014.  He has had no evidence of recurrence but has had recurring bulbous urethral stricture.  He presents at this time for Optilume management of this recurrent stricture.  Past Medical History:  Diagnosis Date   Anemia in chronic kidney disease (CKD)    Anxiety    Arthritis    Chronic back pain    Chronic constipation    CKD (chronic kidney disease), stage III (HCC)    Dyslipidemia    Eczema    Foley catheter in place    GERD (gastroesophageal reflux disease)    Glaucoma, both eyes    Herniated disc    History of adenomatous polyp of colon    History of external beam radiation therapy    06-07-2012  to  08-02-2012;   prostate, 7800 cGy/40 sessions/ 5600cGy pelvic lymph nodes/40 sessions   History of kidney stones    History of prostate cancer 03/03/2012   urologist--- dr Eulogio Ditch;  dx 03/ 2014,  cT2b, Gleason 3+3;  completed IMRT 08-02-2012   History of sepsis    hx of several--- last one [id number] due to complicated UTI secondary stone obstruction;   11-18-2021 admission sepsis UTI + urine and blood   History of TIA (transient ischemic attack) 12/2008   no residual   Hydrocele, bilateral    Hypertension    Impaired memory    Legally blind in right eye, 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   Leukopenia    Thrombocytopenia ([place])    hematology/ ocnologsit--- dr dorsey/ Dede Query PA   Urinary incontinence    Urinary retention    Weakness of both legs    per pt wife , pt's uses cane at times   Wears dentures    upper    Past Surgical History:  Procedure Laterality Date   BILIARY STENT PLACEMENT  06/15/2011   Procedure: BILIARY STENT PLACEMENT;  Surgeon: Franchot Gallo, MD;  Location: [facility];  Service: Urology;  Laterality: Bilateral;   CYSTO/ BILATERAL  RETROGRADE PYELOGRAM/ LEFT URETERSCOPIC STONE EXTRACTION  11/14/2005   LEFT URETERAL CALCULUS   CYSTO/ BILATERAL URETERAL STENTS  11/06/2005   '@[facility]    CYSTO/ LEFT RETROGRADE PYELOGRAM/  LEFT STENT PLACMENT  05/21/2011   '@[facility]    CYSTOSCOPY W/ URETERAL STENT PLACEMENT Right 07/20/2020   Procedure: CYSTOSCOPY WITH RETROGRADE PYELOGRAM/URETERAL STENT PLACEMENT;  Surgeon: Ceasar Mons, MD;  Location: [facility];  Service: Urology;  Laterality: Right;   CYSTOSCOPY W/ URETERAL STENT REMOVAL  06/15/2011   Procedure: CYSTOSCOPY WITH STENT REMOVAL;  Surgeon: Franchot Gallo, MD;  Location: [facility];  Service: Urology;  Laterality: Left;   CYSTOSCOPY W/ URETERAL STENT REMOVAL Right 08/24/2020   Procedure: CYSTOSCOPY WITH STENT REMOVAL;  Surgeon: Franchot Gallo, MD;  Location: [facility];  Service: Urology;  Laterality: Right;   CYSTOSCOPY WITH RETROGRADE PYELOGRAM, URETEROSCOPY AND STENT PLACEMENT Right 08/24/2020   Procedure: CYSTOSCOPY WITH RETROGRADE PYELOGRAM, URETEROSCOPY, STONE EXTRACTION  AND STENT REPLACEMENT;  Surgeon: Franchot Gallo, MD;  Location: [facility];  Service: Urology;  Laterality: Right;  2 HRS   EXTRACORPOREAL SHOCK WAVE LITHOTRIPSY     HOLMIUM LASER APPLICATION Right 1/49/7026   Procedure: HOLMIUM LASER APPLICATION;  Surgeon: Franchot Gallo, MD;  Location: [facility];  Service: Urology;  Laterality: Right;  INTERNAL URETHROTOMY/ TURP  12/23/2002   BPH W/ STRICTURE   PERCUTANEOUS NEPHROLITHOTOMY Bilateral    left 01-13-2010 and 01-29-2010;  right 11-07-2005 , 11-14-2005, and 12-09-2005   '@[facility]    REPAIR LEFT INGUINAL HERNIA W/ MESH  12/23/2002   '@[facility]    RIGHT URETERAL STENT PLACEMENT  02/10/2003   '@[facility]    RIGHT URETEROSCOPIC STONE EXTRACTION  05/29/2003   '@[facility]    URETEROSCOPY  06/15/2011   Procedure: URETEROSCOPY;  Surgeon: Franchot Gallo, MD;  Location: [facility];  Service:  Urology;  Laterality: Bilateral;    Home Medications:    Allergies:  Allergies  Allergen Reactions   Tape Other (See Comments)    Redness and blisters/paper tape-not latex   Doxycycline Hyclate Other (See Comments)    Mental changes    Sulfa Antibiotics Hives    Family History  Problem Relation Age of Onset   Hypertension Mother    Stroke Father    Hypertension Sister    Lupus Sister    Hypertension Brother     Social History:  reports that he quit smoking about 31 years ago. His smoking use included cigarettes. He has never used smokeless tobacco. He reports that he does not drink alcohol and does not use drugs.  ROS: A complete review of systems was performed.  All systems are negative except for pertinent findings as noted.  Physical Exam:  Vital signs in last 24 hours: BP (!) 156/95   Pulse 69   Temp (!) 97.5 F (36.4 C) (Oral)   Resp 16   Ht 6' (1.829 m)   Wt 89.7 kg   SpO2 97%   BMI 26.81 kg/m  Constitutional:  Alert and oriented, No acute distress Cardiovascular: Regular rate  Respiratory: Normal respiratory effort Neurologic: Grossly intact, no focal deficits Psychiatric: Normal mood and affect  I have reviewed prior pt notes  I have reviewed notes from referring/previous physicians  I have reviewed urinalysis results  I have independently reviewed prior imaging   Impression/Assessment:  Recurrent urethral stricture  Plan:  Cystoscopy, Optilume dilation of the urethral stricture

## 2021-12-16 NOTE — OR Nursing (Signed)
Foley catheter removed in [facility] OR #2 by C. Brayah Urquilla, RN prior to case starting.

## 2021-12-16 NOTE — Discharge Instructions (Addendum)
You may see some blood in the urine and may have some burning with urination for 48-72 hours. You also may notice that you have to urinate more frequently or urgently after your procedure which is normal.  You should call should you develop an inability urinate, fever > 101, persistent nausea and vomiting that prevents you from eating or drinking to stay hydrated.  If you have a catheter, you will be taught how to take care of the catheter by the nursing staff prior to discharge from the hospital.  You may periodically feel a strong urge to void with the catheter in place.  This is a bladder spasm and most often can occur when having a bowel movement or moving around. It is typically self-limited and usually will stop after a few minutes.  You may use some Vaseline or Neosporin around the tip of the catheter to reduce friction at the tip of the penis. You may also see some blood in the urine.  A very small amount of blood can make the urine look quite red.  As long as the catheter is draining well, there usually is not a problem.  However, if the catheter is not draining well and is bloody, you should call the office [phone]) to notify us.  It is okay to remove the catheter as instructed by the nurses on Friday morning.          CYSTOSCOPY HOME CARE INSTRUCTIONS  Activity: Rest for the remainder of the day.  Do not drive or operate equipment today.  You may resume normal activities in one to two days as instructed by your physician.   Meals: Drink plenty of liquids and eat light foods such as gelatin or soup this evening.  You may return to a normal meal plan tomorrow.  Return to Work: You may return to work in one to two days or as instructed by your physician.  Special Instructions / Symptoms: Call your physician if any of these symptoms occur:   -persistent or heavy bleeding  -bleeding which continues after first few urination  -large blood clots that are difficult to  pass  -urine stream diminishes or stops completely  -fever equal to or higher than 101 degrees Farenheit.  -cloudy urine with a strong, foul odor  -severe pain  You may feel some burning pain when you urinate.  This should disappear with time.  Applying moist heat to the lower abdomen or a hot tub bath may help relieve the pain.           Post Anesthesia Home Care Instructions  Activity: Get plenty of rest for the remainder of the day. A responsible individual must stay with you for 24 hours following the procedure.  For the next 24 hours, DO NOT: -Drive a car -Paediatric nurse -Drink alcoholic beverages -Take any medication unless instructed by your physician -Make any legal decisions or sign important papers.  Meals: Start with liquid foods such as gelatin or soup. Progress to regular foods as tolerated. Avoid greasy, spicy, heavy foods. If nausea and/or vomiting occur, drink only clear liquids until the nausea and/or vomiting subsides. Call your physician if vomiting continues.  Special Instructions/Symptoms: Your throat may feel dry or sore from the anesthesia or the breathing tube placed in your throat during surgery. If this causes discomfort, gargle with warm salt water. The discomfort should disappear within 24 hours.

## 2021-12-20 ENCOUNTER — Encounter (HOSPITAL_BASED_OUTPATIENT_CLINIC_OR_DEPARTMENT_OTHER): Payer: Self-pay | Admitting: Urology

## 2021-12-29 NOTE — Op Note (Signed)
Preoperative diagnosis: Postirradiation recurrent urethral stricture  Postoperative diagnosis: Same  Procedure: Cystoscopy, Optilume balloon dilation of recurrent bulbous urethral stricture, use of fluoroscopy for 1 or 1 hour  Surgeon: Plummer Matich  Anesthesia: General with LMA  Complications: None  Drains: None  Estimated blood loss: None  Indications: 81 year old male with long history of recurrent stones as well as prostate cancer.  He underwent EBRT for his prostate cancer in 2014.  He has had recurrent bulbous urethral stricture and has presented in urinary retention several times and has had prior urethral stricture treatment with balloon dilation.  Presents at this time and urinary retention with catheter in place for management of the recurrent stricture.  I have counseled him in balloon dilation with an Optilume balloon.  he understands the process and agrees to proceed. Description of procedure: Patient properly identified in the holding area.  Taken the operating room where general anesthetic was administered with the LMA.  Placed in dorsolithotomy position.  Genitalia and perineum were prepped, draped, proper timeout performed.  21 French panendoscope advanced under direct vision into his urethra where a very dense bulbous urethral stricture was seen.  I negotiated a sensor tip guidewire through this with a curl seen in the bladder using fluoroscopy.  I then passed a 5 cm Optilume balloon over top of the guidewire, positioning and to straddled the urethral stricture and the prostatic urethra.  Fluoroscopy was used for this.  Once adequately positioned, the balloon was inflated to 16 atm and it was left inflated for 10 minutes.  Following this the balloon was deflated and both the balloon catheter and sensor tip guidewire were removed.  I was then easily able to pass the scope into the bladder which was then inspected.  No urothelial lesions were present.  Obvious trabeculations noted.  No  stones were present.  At this point the scope was removed following bladder drainage.  An 21 French Foley catheter was placed quite easily with the balloon filled with 10 cc of water. This was hooked to dependent drainage.  At this point the procedure was terminated.  The patient was awakened, taken to PACU in stable condition having tolerated the procedure well.

## 2022-01-06 ENCOUNTER — Other Ambulatory Visit (HOSPITAL_COMMUNITY): Payer: Self-pay | Admitting: Student

## 2022-01-07 ENCOUNTER — Ambulatory Visit (HOSPITAL_COMMUNITY)
Admission: RE | Admit: 2022-01-07 | Discharge: 2022-01-07 | Disposition: A | Discharge status: Home / Self Care | Payer: Medicare PPO | Source: Ambulatory Visit | Attending: Physician Assistant | Admitting: Physician Assistant

## 2022-01-07 ENCOUNTER — Encounter (HOSPITAL_COMMUNITY): Payer: Self-pay

## 2022-01-07 ENCOUNTER — Other Ambulatory Visit: Payer: Self-pay

## 2022-01-07 DIAGNOSIS — Z8673 Personal history of transient ischemic attack (TIA), and cerebral infarction without residual deficits: Secondary | ICD-10-CM | POA: Insufficient documentation

## 2022-01-07 DIAGNOSIS — D61818 Other pancytopenia: Secondary | ICD-10-CM | POA: Diagnosis not present

## 2022-01-07 DIAGNOSIS — I129 Hypertensive chronic kidney disease with stage 1 through stage 4 chronic kidney disease, or unspecified chronic kidney disease: Secondary | ICD-10-CM | POA: Insufficient documentation

## 2022-01-07 DIAGNOSIS — D696 Thrombocytopenia, unspecified: Secondary | ICD-10-CM | POA: Diagnosis not present

## 2022-01-07 DIAGNOSIS — Z1379 Encounter for other screening for genetic and chromosomal anomalies: Secondary | ICD-10-CM | POA: Diagnosis not present

## 2022-01-07 DIAGNOSIS — H548 Legal blindness, as defined in USA: Secondary | ICD-10-CM | POA: Diagnosis not present

## 2022-01-07 DIAGNOSIS — Z87442 Personal history of urinary calculi: Secondary | ICD-10-CM | POA: Insufficient documentation

## 2022-01-07 DIAGNOSIS — M1990 Unspecified osteoarthritis, unspecified site: Secondary | ICD-10-CM | POA: Insufficient documentation

## 2022-01-07 DIAGNOSIS — N183 Chronic kidney disease, stage 3 unspecified: Secondary | ICD-10-CM | POA: Insufficient documentation

## 2022-01-07 DIAGNOSIS — F419 Anxiety disorder, unspecified: Secondary | ICD-10-CM | POA: Diagnosis not present

## 2022-01-07 DIAGNOSIS — D539 Nutritional anemia, unspecified: Secondary | ICD-10-CM | POA: Diagnosis not present

## 2022-01-07 DIAGNOSIS — K219 Gastro-esophageal reflux disease without esophagitis: Secondary | ICD-10-CM | POA: Insufficient documentation

## 2022-01-07 DIAGNOSIS — D631 Anemia in chronic kidney disease: Secondary | ICD-10-CM | POA: Insufficient documentation

## 2022-01-07 DIAGNOSIS — D72819 Decreased white blood cell count, unspecified: Secondary | ICD-10-CM | POA: Diagnosis present

## 2022-01-07 DIAGNOSIS — Z8546 Personal history of malignant neoplasm of prostate: Secondary | ICD-10-CM | POA: Insufficient documentation

## 2022-01-07 DIAGNOSIS — H409 Unspecified glaucoma: Secondary | ICD-10-CM | POA: Insufficient documentation

## 2022-01-07 DIAGNOSIS — E785 Hyperlipidemia, unspecified: Secondary | ICD-10-CM | POA: Insufficient documentation

## 2022-01-07 LAB — CBC WITH DIFFERENTIAL/PLATELET
Abs Immature Granulocytes: 0.03 10*3/uL (ref 0.00–0.07)
Basophils Absolute: 0 10*3/uL (ref 0.0–0.1)
Basophils Relative: 0 %
Eosinophils Absolute: 0.1 10*3/uL (ref 0.0–0.5)
Eosinophils Relative: 3 %
HCT: 38.7 % — ABNORMAL LOW (ref 39.0–52.0)
Hemoglobin: 12.6 g/dL — ABNORMAL LOW (ref 13.0–17.0)
Immature Granulocytes: 1 %
Lymphocytes Relative: 16 %
Lymphs Abs: 0.6 10*3/uL — ABNORMAL LOW (ref 0.7–4.0)
MCH: 33.2 pg (ref 26.0–34.0)
MCHC: 32.6 g/dL (ref 30.0–36.0)
MCV: 102.1 fL — ABNORMAL HIGH (ref 80.0–100.0)
Monocytes Absolute: 0.5 10*3/uL (ref 0.1–1.0)
Monocytes Relative: 15 %
Neutro Abs: 2.3 10*3/uL (ref 1.7–7.7)
Neutrophils Relative %: 65 %
Platelets: 71 10*3/uL — ABNORMAL LOW (ref 150–400)
RBC: 3.79 MIL/uL — ABNORMAL LOW (ref 4.22–5.81)
RDW: 12.3 % (ref 11.5–15.5)
WBC: 3.5 10*3/uL — ABNORMAL LOW (ref 4.0–10.5)
nRBC: 0 % (ref 0.0–0.2)

## 2022-01-07 MED ORDER — FENTANYL CITRATE (PF) 100 MCG/2ML IJ SOLN
INTRAMUSCULAR | Status: AC
  Filled 2022-01-07: qty 2

## 2022-01-07 MED ORDER — FENTANYL CITRATE (PF) 100 MCG/2ML IJ SOLN
INTRAMUSCULAR | Status: AC | PRN
  Administered 2022-01-07 (×2): 50 ug via INTRAVENOUS

## 2022-01-07 MED ORDER — MIDAZOLAM HCL 2 MG/2ML IJ SOLN
INTRAMUSCULAR | Status: AC
  Filled 2022-01-07: qty 2

## 2022-01-07 MED ORDER — SODIUM CHLORIDE 0.9 % IV SOLN: INTRAVENOUS | Status: DC

## 2022-01-07 MED ORDER — MIDAZOLAM HCL 2 MG/2ML IJ SOLN
INTRAMUSCULAR | Status: AC | PRN
  Administered 2022-01-07 (×2): 1 mg via INTRAVENOUS

## 2022-01-07 MED ORDER — LIDOCAINE HCL (PF) 1 % IJ SOLN
INTRAMUSCULAR | Status: AC | PRN
  Administered 2022-01-07: 20 mL

## 2022-01-07 NOTE — Procedures (Signed)
Interventional Radiology Procedure Note  Procedure: CT BM ASP AND CORE     Complications: None  Estimated Blood Loss:  0  Findings: 11 G CORE AND ASP    M. TREVOR Arius Harnois, MD    

## 2022-01-07 NOTE — Discharge Instructions (Signed)

## 2022-01-07 NOTE — H&P (Signed)
Referring Physician(s): Dorsey,J  Supervising Physician: Daryll Brod  Patient Status:  [facility]  Chief Complaint: "I'm getting a bone biopsy"   Subjective: Patient familiar to IR service from right nephrostomy placements in 2007 and 2009 and left nephrostomy placement in 2012.  He has a past medical history significant for anemia, anxiety, arthritis, chronic kidney disease, dyslipidemia, GERD, glaucoma, nephrolithiasis, prostate cancer, prior TIA, hypertension, legally blind right eye and presents now with persistent leukopenia and thrombocytopenia of uncertain etiology.  He is scheduled today for CT-guided bone marrow biopsy for further evaluation.  He currently denies fever, headache, chest pain, dyspnea, cough, abdominal pain, nausea, vomiting or bleeding.  He does have a history of urinary incontinence and chronic low back pain.  Past Medical History:  Diagnosis Date   Anemia in chronic kidney disease (CKD)    Anxiety    Arthritis    Chronic back pain    Chronic constipation    CKD (chronic kidney disease), stage III (HCC)    Dyslipidemia    Eczema    Foley catheter in place    GERD (gastroesophageal reflux disease)    Glaucoma, both eyes    Herniated disc    History of adenomatous polyp of colon    History of external beam radiation therapy    06-07-2012  to  08-02-2012;   prostate, 7800 cGy/40 sessions/ 5600cGy pelvic lymph nodes/40 sessions   History of kidney stones    History of prostate cancer 03/03/2012   urologist--- dr Eulogio Ditch;  dx 03/ 2014,  cT2b, Gleason 3+3;  completed IMRT 08-02-2012   History of sepsis    hx of several--- last one [id number] due to complicated UTI secondary stone obstruction;   11-18-2021 admission sepsis UTI + urine and blood   History of TIA (transient ischemic attack) 12/2008   no residual   Hydrocele, bilateral    Hypertension    Impaired memory    Legally blind in right eye, 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   Leukopenia     Thrombocytopenia ([place])    hematology/ ocnologsit--- dr dorsey/ Dede Query PA   Urinary incontinence    Urinary retention    Weakness of both legs    per pt wife , pt's uses cane at times   Wears dentures    upper   Past Surgical History:  Procedure Laterality Date   BILIARY STENT PLACEMENT  06/15/2011   Procedure: BILIARY STENT PLACEMENT;  Surgeon: Franchot Gallo, MD;  Location: [facility];  Service: Urology;  Laterality: Bilateral;   CYSTO/ BILATERAL RETROGRADE PYELOGRAM/ LEFT URETERSCOPIC STONE EXTRACTION  11/14/2005   LEFT URETERAL CALCULUS   CYSTO/ BILATERAL URETERAL STENTS  11/06/2005   '@[facility]'$    CYSTO/ LEFT RETROGRADE PYELOGRAM/  LEFT STENT PLACMENT  05/21/2011   '@[facility]    CYSTOSCOPY W/ URETERAL STENT PLACEMENT Right 07/20/2020   Procedure: CYSTOSCOPY WITH RETROGRADE PYELOGRAM/URETERAL STENT PLACEMENT;  Surgeon: Ceasar Mons, MD;  Location: [facility];  Service: Urology;  Laterality: Right;   CYSTOSCOPY W/ URETERAL STENT REMOVAL  06/15/2011   Procedure: CYSTOSCOPY WITH STENT REMOVAL;  Surgeon: Franchot Gallo, MD;  Location: [facility];  Service: Urology;  Laterality: Left;   CYSTOSCOPY W/ URETERAL STENT REMOVAL Right 08/24/2020   Procedure: CYSTOSCOPY WITH STENT REMOVAL;  Surgeon: Franchot Gallo, MD;  Location[facility];  Service: Urology;  Laterality: Right;   CYSTOSCOPY WITH RETROGRADE PYELOGRAM, URETEROSCOPY AND STENT PLACEMENT Right 08/24/2020   Procedure:  CYSTOSCOPY WITH RETROGRADE PYELOGRAM, URETEROSCOPY, STONE EXTRACTION  AND STENT REPLACEMENT;  Surgeon: Franchot Gallo, MD;  Location: [facility];  Service: Urology;  Laterality: Right;  2 HRS   CYSTOSCOPY WITH URETHRAL DILATATION N/A 12/16/2021   Procedure: CYSTOSCOPY WITH  OPTILUME URETHRAL DILATATION;  Surgeon: Franchot Gallo, MD;  Location: [facility];  Service: Urology;  Laterality: N/A;  30 MINS   EXTRACORPOREAL SHOCK  WAVE LITHOTRIPSY     HOLMIUM LASER APPLICATION Right 5/73/2202   Procedure: HOLMIUM LASER APPLICATION;  Surgeon: Franchot Gallo, MD;  Location: [facility];  Service: Urology;  Laterality: Right;   INTERNAL URETHROTOMY/ TURP  12/23/2002   BPH W/ STRICTURE   PERCUTANEOUS NEPHROLITHOTOMY Bilateral    left 01-13-2010 and 01-29-2010;  right 11-07-2005 , 11-14-2005, and 12-09-2005   '@[facility]'$    REPAIR LEFT INGUINAL HERNIA W/ MESH  12/23/2002   '@[facility]'$    RIGHT URETERAL STENT PLACEMENT  02/10/2003   '@[facility]    RIGHT URETEROSCOPIC STONE EXTRACTION  05/29/2003   '@[facility]'$    URETEROSCOPY  06/15/2011   Procedure: URETEROSCOPY;  Surgeon: Franchot Gallo, MD;  Location: [facility];  Service: Urology;  Laterality: Bilateral;      Allergies: Tape, Doxycycline hyclate, and Sulfa antibiotics  Medications: Prior to Admission medications   Medication Sig Start Date End Date Taking? Authorizing Provider  acetaminophen (TYLENOL) 325 MG tablet Take 2 tablets (650 mg total) by mouth every 6 (six) hours as needed for mild pain (or Fever >/= 101). 11/03/19  Yes Samtani, Darylene Price, MD  ALPHAGAN P 0.1 % SOLN Place 1 drop into the right eye every 8 (eight) hours. 09/25/19  Yes [provider]  ALPRAZolam Duanne Moron) 0.5 MG tablet Take 0.5 mg by mouth 2 (two) times daily as needed for anxiety or sleep. 10/01/19  Yes [provider]  amLODipine (NORVASC) 10 MG tablet Take 1 tablet (10 mg total) by mouth daily. Patient taking differently: Take 10 mg by mouth at bedtime. 03/16/17  Yes Jaynee Eagles, PA-C  Ascorbic Acid (VITAMIN C PO) Take 1 tablet by mouth daily.   Yes [provider]  atenolol (TENORMIN) 50 MG tablet Take 1 tablet (50 mg total) by mouth daily. Pt needs appointment for further refills Patient taking differently: Take 50 mg by mouth daily. 10/22/17  Yes Forrest Moron, MD  B Complex Vitamins (B COMPLEX PO) Take 1 tablet by mouth daily.   Yes [provider]  Calcium Carbonate-Simethicone (MAALOX MAX PO) Take 1 Dose by mouth daily as needed (mild constipation).   Yes [provider]  HYDROcodone-acetaminophen (NORCO) 10-325 MG tablet Take 1 tablet by mouth 4 (four) times daily as needed for moderate pain or severe pain.  02/12/14  Yes [provider]  Magnesium Hydroxide (DULCOLAX SOFT CHEWS PO) Take 1 tablet by mouth daily as needed (mild constipation).   Yes [provider]  ROCKLATAN 0.02-0.005 % SOLN Place 1 drop into both eyes at bedtime. 09/24/19  Yes [provider]  [place] 1-0.2 % SUSP Place 1 drop into the right eye 3 (three) times daily. 08/28/20  Yes [provider]  sodium chloride (OCEAN) 0.65 % SOLN nasal spray Place 1 spray into both nostrils daily as needed for congestion.   Yes [provider]  timolol (TIMOPTIC) 0.5 % ophthalmic solution Place 1 drop into both eyes daily. 01/11/17  Yes [provider]  naloxone (NARCAN) nasal spray 4 mg/0.1 mL Place 1 spray into the nose once as needed for  opioid reversal. 08/26/20   [provider]     Vital Signs: BP (!) 148/80   Pulse 68   Temp 97.7 F (36.5 C) (Oral)   Resp 19   Ht 6' (1.829 m)   Wt 190 lb (86.2 kg)   BMI 25.77 kg/m   Physical Exam patient awake, answering questions okay, chest clear to auscultation bilaterally.  Heart with regular rate and rhythm.  Abdomen soft, positive bowel sounds, nontender.  No significant lower extremity edema.  Imaging: No results found.  Labs:  CBC: Recent Labs    10/20/21 1201 11/22/21 1030 12/16/21 1201  WBC 3.0* 4.6  --   HGB 12.4* 12.5* 13.3  13.3  HCT 37.1* 38.5* 39.0  39.0  PLT 81* 67*  --     COAGS: No results for input(s): "INR", "APTT" in the last 8760 hours.  BMP: Recent Labs    10/20/21 1201 11/22/21 1030 12/16/21 1201  NA 141 140 141  141  K 4.0 5.0 4.8  4.8  CL 110 109 107  107  CO2 27 22  --   GLUCOSE 94 120* 111*   111*  BUN '14 17 21  21  '$ CALCIUM 9.7 9.3  --   CREATININE 1.82* 1.94* 1.70*  1.70*  GFRNONAA 37* 34*  --     LIVER FUNCTION TESTS: Recent Labs    10/20/21 1201 11/22/21 1030  BILITOT 0.5 1.2  AST 19 32  ALT 11 14  ALKPHOS 57 48  PROT 7.0 6.9  ALBUMIN 4.2 4.1    Assessment and Plan: Patient familiar to IR service from right nephrostomy placements in 2007 and 2009 and left nephrostomy placement in 2012.  He has a past medical history significant for anemia, anxiety, arthritis, chronic kidney disease, dyslipidemia, GERD, glaucoma, nephrolithiasis, prostate cancer, prior TIA, hypertension, legally blind right eye and presents now with persistent leukopenia and thrombocytopenia of uncertain etiology.  He is scheduled today for CT-guided bone marrow biopsy for further evaluation.Risks and benefits of procedure was discussed with the patient /spouse including, but not limited to bleeding, infection, damage to adjacent structures or low yield requiring additional tests.  All of the questions were answered and there is agreement to proceed.  Consent signed and in chart.    Electronically Signed: D. Rowe Robert, PA-C 01/07/2022, 8:10 AM   I spent a total of 20 minutes at the the patient's bedside AND on the patient's hospital floor or unit, greater than 50% of which was counseling/coordinating care for CT-guided bone marrow biopsy

## 2022-01-12 ENCOUNTER — Other Ambulatory Visit: Payer: Self-pay | Admitting: Hematology and Oncology

## 2022-01-12 ENCOUNTER — Inpatient Hospital Stay: Payer: Medicare PPO | Attending: Physician Assistant | Admitting: Hematology and Oncology

## 2022-01-12 DIAGNOSIS — Z8249 Family history of ischemic heart disease and other diseases of the circulatory system: Secondary | ICD-10-CM | POA: Insufficient documentation

## 2022-01-12 DIAGNOSIS — D61818 Other pancytopenia: Secondary | ICD-10-CM

## 2022-01-12 DIAGNOSIS — Z832 Family history of diseases of the blood and blood-forming organs and certain disorders involving the immune mechanism: Secondary | ICD-10-CM | POA: Insufficient documentation

## 2022-01-12 DIAGNOSIS — D649 Anemia, unspecified: Secondary | ICD-10-CM | POA: Insufficient documentation

## 2022-01-12 DIAGNOSIS — Z87891 Personal history of nicotine dependence: Secondary | ICD-10-CM | POA: Insufficient documentation

## 2022-01-12 DIAGNOSIS — Z87440 Personal history of urinary (tract) infections: Secondary | ICD-10-CM | POA: Insufficient documentation

## 2022-01-12 DIAGNOSIS — Z86010 Personal history of colonic polyps: Secondary | ICD-10-CM | POA: Insufficient documentation

## 2022-01-12 DIAGNOSIS — Z79899 Other long term (current) drug therapy: Secondary | ICD-10-CM | POA: Insufficient documentation

## 2022-01-12 DIAGNOSIS — Z923 Personal history of irradiation: Secondary | ICD-10-CM | POA: Insufficient documentation

## 2022-01-12 DIAGNOSIS — Z823 Family history of stroke: Secondary | ICD-10-CM | POA: Insufficient documentation

## 2022-01-12 DIAGNOSIS — D72819 Decreased white blood cell count, unspecified: Secondary | ICD-10-CM | POA: Insufficient documentation

## 2022-01-12 DIAGNOSIS — R110 Nausea: Secondary | ICD-10-CM | POA: Insufficient documentation

## 2022-01-12 DIAGNOSIS — Z9079 Acquired absence of other genital organ(s): Secondary | ICD-10-CM | POA: Insufficient documentation

## 2022-01-12 DIAGNOSIS — Z8546 Personal history of malignant neoplasm of prostate: Secondary | ICD-10-CM | POA: Insufficient documentation

## 2022-01-12 DIAGNOSIS — Z882 Allergy status to sulfonamides status: Secondary | ICD-10-CM | POA: Insufficient documentation

## 2022-01-12 DIAGNOSIS — Z8673 Personal history of transient ischemic attack (TIA), and cerebral infarction without residual deficits: Secondary | ICD-10-CM | POA: Insufficient documentation

## 2022-01-12 DIAGNOSIS — D469 Myelodysplastic syndrome, unspecified: Secondary | ICD-10-CM | POA: Insufficient documentation

## 2022-01-12 LAB — SURGICAL PATHOLOGY

## 2022-01-14 ENCOUNTER — Encounter (HOSPITAL_COMMUNITY): Payer: Self-pay | Admitting: Physician Assistant

## 2022-01-21 ENCOUNTER — Telehealth: Payer: Self-pay | Admitting: Hematology and Oncology

## 2022-01-21 NOTE — Telephone Encounter (Signed)
Called patient per 1/18 secure chat to schedule appointment next week to go over results. Left voicemail with appointment information.

## 2022-01-24 ENCOUNTER — Telehealth: Payer: Self-pay | Admitting: Hematology and Oncology

## 2022-01-24 NOTE — Telephone Encounter (Signed)
Patient called back to confirm upcoming appointment. Patient notified.

## 2022-01-25 ENCOUNTER — Other Ambulatory Visit: Payer: Self-pay

## 2022-01-25 ENCOUNTER — Inpatient Hospital Stay: Payer: Medicare PPO

## 2022-01-25 ENCOUNTER — Inpatient Hospital Stay (HOSPITAL_BASED_OUTPATIENT_CLINIC_OR_DEPARTMENT_OTHER): Payer: Medicare PPO | Admitting: Hematology and Oncology

## 2022-01-25 VITALS — BP 122/84 | HR 80 | Temp 97.7°F | Resp 16 | Ht 72.0 in | Wt 191.9 lb

## 2022-01-25 DIAGNOSIS — Z86010 Personal history of colonic polyps: Secondary | ICD-10-CM | POA: Diagnosis not present

## 2022-01-25 DIAGNOSIS — D61818 Other pancytopenia: Secondary | ICD-10-CM | POA: Diagnosis not present

## 2022-01-25 DIAGNOSIS — Z87891 Personal history of nicotine dependence: Secondary | ICD-10-CM | POA: Diagnosis not present

## 2022-01-25 DIAGNOSIS — R110 Nausea: Secondary | ICD-10-CM | POA: Diagnosis not present

## 2022-01-25 DIAGNOSIS — Z79899 Other long term (current) drug therapy: Secondary | ICD-10-CM | POA: Diagnosis not present

## 2022-01-25 DIAGNOSIS — D696 Thrombocytopenia, unspecified: Secondary | ICD-10-CM

## 2022-01-25 DIAGNOSIS — Z9079 Acquired absence of other genital organ(s): Secondary | ICD-10-CM | POA: Diagnosis not present

## 2022-01-25 DIAGNOSIS — Z8673 Personal history of transient ischemic attack (TIA), and cerebral infarction without residual deficits: Secondary | ICD-10-CM | POA: Diagnosis not present

## 2022-01-25 DIAGNOSIS — Z87440 Personal history of urinary (tract) infections: Secondary | ICD-10-CM | POA: Diagnosis not present

## 2022-01-25 DIAGNOSIS — Z8249 Family history of ischemic heart disease and other diseases of the circulatory system: Secondary | ICD-10-CM | POA: Diagnosis not present

## 2022-01-25 DIAGNOSIS — Z882 Allergy status to sulfonamides status: Secondary | ICD-10-CM | POA: Diagnosis not present

## 2022-01-25 DIAGNOSIS — Z823 Family history of stroke: Secondary | ICD-10-CM | POA: Diagnosis not present

## 2022-01-25 DIAGNOSIS — Z8546 Personal history of malignant neoplasm of prostate: Secondary | ICD-10-CM | POA: Diagnosis not present

## 2022-01-25 DIAGNOSIS — Z923 Personal history of irradiation: Secondary | ICD-10-CM | POA: Diagnosis not present

## 2022-01-25 DIAGNOSIS — D469 Myelodysplastic syndrome, unspecified: Secondary | ICD-10-CM | POA: Diagnosis not present

## 2022-01-25 DIAGNOSIS — D72819 Decreased white blood cell count, unspecified: Secondary | ICD-10-CM | POA: Diagnosis present

## 2022-01-25 DIAGNOSIS — Z832 Family history of diseases of the blood and blood-forming organs and certain disorders involving the immune mechanism: Secondary | ICD-10-CM | POA: Diagnosis not present

## 2022-01-25 DIAGNOSIS — D649 Anemia, unspecified: Secondary | ICD-10-CM | POA: Diagnosis not present

## 2022-01-25 LAB — CMP (CANCER CENTER ONLY)
ALT: 13 U/L (ref 0–44)
AST: 20 U/L (ref 15–41)
Albumin: 4.1 g/dL (ref 3.5–5.0)
Alkaline Phosphatase: 66 U/L (ref 38–126)
Anion gap: 5 (ref 5–15)
BUN: 28 mg/dL — ABNORMAL HIGH (ref 8–23)
CO2: 28 mmol/L (ref 22–32)
Calcium: 9.9 mg/dL (ref 8.9–10.3)
Chloride: 108 mmol/L (ref 98–111)
Creatinine: 2.23 mg/dL — ABNORMAL HIGH (ref 0.61–1.24)
GFR, Estimated: 29 mL/min — ABNORMAL LOW (ref >60)
Glucose, Bld: 104 mg/dL — ABNORMAL HIGH (ref 70–99)
Potassium: 4.6 mmol/L (ref 3.5–5.1)
Sodium: 141 mmol/L (ref 135–145)
Total Bilirubin: 0.5 mg/dL (ref 0.3–1.2)
Total Protein: 7 g/dL (ref 6.5–8.1)

## 2022-01-25 LAB — CBC WITH DIFFERENTIAL (CANCER CENTER ONLY)
Abs Immature Granulocytes: 0.03 10*3/uL (ref 0.00–0.07)
Basophils Absolute: 0 10*3/uL (ref 0.0–0.1)
Basophils Relative: 1 %
Eosinophils Absolute: 0.2 10*3/uL (ref 0.0–0.5)
Eosinophils Relative: 5 %
HCT: 37.9 % — ABNORMAL LOW (ref 39.0–52.0)
Hemoglobin: 12.9 g/dL — ABNORMAL LOW (ref 13.0–17.0)
Immature Granulocytes: 1 %
Lymphocytes Relative: 20 %
Lymphs Abs: 0.6 10*3/uL — ABNORMAL LOW (ref 0.7–4.0)
MCH: 33.8 pg (ref 26.0–34.0)
MCHC: 34 g/dL (ref 30.0–36.0)
MCV: 99.2 fL (ref 80.0–100.0)
Monocytes Absolute: 0.6 10*3/uL (ref 0.1–1.0)
Monocytes Relative: 23 %
Neutro Abs: 1.4 10*3/uL — ABNORMAL LOW (ref 1.7–7.7)
Neutrophils Relative %: 50 %
Platelet Count: 103 10*3/uL — ABNORMAL LOW (ref 150–400)
RBC: 3.82 MIL/uL — ABNORMAL LOW (ref 4.22–5.81)
RDW: 12.1 % (ref 11.5–15.5)
WBC Count: 2.8 10*3/uL — ABNORMAL LOW (ref 4.0–10.5)
nRBC: 0 % (ref 0.0–0.2)

## 2022-01-25 NOTE — Progress Notes (Signed)
[place] [phone]   [phone]  PROGRESS NOTE  Patient Care Team: Milford Cage, PA as PCP - General (Physician Assistant)  Hematological/Oncological History # Myelodysplastic Syndrome -06/25/2021: Labs from PCP, Jeanella Anton NP:WBC 2.5 (L), Hgb 12.8, MCV 103(H), Plt 76K (L) -10/20/2021: Establish care with [facility].  01/07/2022: bone marrow biopsy performed, showed hypercellular bone marrow (average 40%) with orderly myeloid and erythroid maturation and mild dysmorphic megakaryocytes   Interval History:  [name] 83 y.o. male with medical history significant for newly diagnosed MDS who presents for a follow up visit. The patient's last visit was on 10/20/2021. In the interim since the last visit he underwent a bone marrow biopsy on 01/07/2022 which confirmed MDS.  On exam today [name] companied by his wife.  He reports the bone marrow biopsy procedure went fine.  He notes did not have any pain, discomfort, or bleeding after the procedure.  He did have a little bit of achiness for approximately 2 to 3 days afterwards.  Otherwise he has been "okay".  He notes his energy levels remain poor and currently ranks his energy is about a 3 out of 10.  He notes he has been eating well and has increased his weight by a few pound since her last visit.  He is not having any trouble with nausea, vomiting, or diarrhea.  He reports he did have a case of feeling nauseated yesterday but only lasted about 15 minutes.  He reports he is having no nosebleeds, gum bleeding, dark stools, or blood in his urine/stool.  He reports overall he is at his baseline level of health.  He otherwise has a negative 10 point ROS.  MEDICAL HISTORY:  Past Medical History:  Diagnosis Date   Anemia in chronic kidney disease (CKD)    Anxiety    Arthritis    Chronic back pain    Chronic constipation    CKD (chronic kidney disease), stage III (HCC)    Dyslipidemia     Eczema    Foley catheter in place    GERD (gastroesophageal reflux disease)    Glaucoma, both eyes    Herniated disc    History of adenomatous polyp of colon    History of external beam radiation therapy    06-07-2012  to  08-02-2012;   prostate, 7800 cGy/40 sessions/ 5600cGy pelvic lymph nodes/40 sessions   History of kidney stones    History of prostate cancer 03/03/2012   urologist--- dr Eulogio Ditch;  dx 03/ 2014,  cT2b, Gleason 3+3;  completed IMRT 08-02-2012   History of sepsis    hx of several--- last one [id number] due to complicated UTI secondary stone obstruction;   11-18-2021 admission sepsis UTI + urine and blood   History of TIA (transient ischemic attack) 12/2008   no residual   Hydrocele, bilateral    Hypertension    Impaired memory    Legally blind in right eye, 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   Leukopenia    Thrombocytopenia ([place])    hematology/ ocnologsit--- dr Francis Doenges/ Dede Query PA   Urinary incontinence    Urinary retention    Weakness of both legs    per pt wife , pt's uses cane at times   Wears dentures    upper    SURGICAL HISTORY: Past Surgical History:  Procedure Laterality Date   BILIARY STENT PLACEMENT  06/15/2011   Procedure: BILIARY STENT PLACEMENT;  Surgeon: Franchot Gallo,  MD;  Location: [facility];  Service: Urology;  Laterality: Bilateral;   CYSTO/ BILATERAL RETROGRADE PYELOGRAM/ LEFT URETERSCOPIC STONE EXTRACTION  11/14/2005   LEFT URETERAL CALCULUS   CYSTO/ BILATERAL URETERAL STENTS  11/06/2005   '@[facility]    CYSTO/ LEFT RETROGRADE PYELOGRAM/  LEFT STENT PLACMENT  05/21/2011   [facility]    CYSTOSCOPY W/ URETERAL STENT PLACEMENT Right 07/20/2020   Procedure: CYSTOSCOPY WITH RETROGRADE PYELOGRAM/URETERAL STENT PLACEMENT;  Surgeon: Ceasar Mons, MD;  Location: [facility];  Service: Urology;  Laterality: Right;   CYSTOSCOPY W/ URETERAL STENT REMOVAL  06/15/2011   Procedure: CYSTOSCOPY WITH STENT REMOVAL;  Surgeon: Franchot Gallo, MD;  Location: [facility];  Service: Urology;  Laterality: Left;   CYSTOSCOPY W/ URETERAL STENT REMOVAL Right 08/24/2020   Procedure: CYSTOSCOPY WITH STENT REMOVAL;  Surgeon: Franchot Gallo, MD;  Location: [facility];  Service: Urology;  Laterality: Right;   CYSTOSCOPY WITH RETROGRADE PYELOGRAM, URETEROSCOPY AND STENT PLACEMENT Right 08/24/2020   Procedure: CYSTOSCOPY WITH RETROGRADE PYELOGRAM, URETEROSCOPY, STONE EXTRACTION  AND STENT REPLACEMENT;  Surgeon: Franchot Gallo, MD;  Location[facility];  Service: Urology;  Laterality: Right;  2 HRS   CYSTOSCOPY WITH URETHRAL DILATATION N/A 12/16/2021   Procedure: CYSTOSCOPY WITH  OPTILUME URETHRAL DILATATION;  Surgeon: Franchot Gallo, MD;  Location: [facility];  Service: Urology;  Laterality: N/A;  30 MINS   EXTRACORPOREAL SHOCK WAVE LITHOTRIPSY     HOLMIUM LASER APPLICATION Right 05/20/6158   Procedure: HOLMIUM LASER APPLICATION;  Surgeon: Franchot Gallo, MD;  Location: [facility];  Service: Urology;  Laterality: Right;   INTERNAL URETHROTOMY/ TURP  12/23/2002   BPH W/ STRICTURE   PERCUTANEOUS NEPHROLITHOTOMY Bilateral    left 01-13-2010 and 01-29-2010;  right 11-07-2005 , 11-14-2005, and 12-09-2005   '@[facility]    REPAIR LEFT INGUINAL HERNIA W/ MESH  12/23/2002   '@[facility]    RIGHT URETERAL STENT PLACEMENT  02/10/2003   '@[facility]    RIGHT URETEROSCOPIC STONE EXTRACTION  05/29/2003   '@[facility]    URETEROSCOPY  06/15/2011   Procedure: URETEROSCOPY;  Surgeon: Franchot Gallo, MD;  Location: [facility];  Service: Urology;  Laterality: Bilateral;    SOCIAL HISTORY: Social History   Socioeconomic History   Marital status: Married    Spouse name: Not on file   Number of children: Not on file   Years of education: Not on file   Highest education level: Not on file  Occupational History   Not on file  Tobacco Use   Smoking status: Former     Years: 40.00    Types: Cigarettes    Quit date: 06/10/1990    Years since quitting: 31.6   Smokeless tobacco: Never  Vaping Use   Vaping Use: Never used  Substance and Sexual Activity   Alcohol use: No   Drug use: Never   Sexual activity: Yes    Birth control/protection: None  Other Topics Concern   Not on file  Social History Narrative   Not on file   Social Determinants of Health   Financial Resource Strain: Not on file  Food Insecurity: Not on file  Transportation Needs: Not on file  Physical Activity: Not on file  Stress: Not on file  Social Connections: Not on file  Intimate Partner Violence: Not on file    FAMILY HISTORY: Family History  Problem Relation Age of Onset   Hypertension Mother    Stroke Father    Hypertension Sister    Lupus  Sister    Hypertension Brother     ALLERGIES:  is allergic to tape, doxycycline hyclate, and sulfa antibiotics.  MEDICATIONS:  Current Outpatient Medications  Medication Sig Dispense Refill   acetaminophen (TYLENOL) 325 MG tablet Take 2 tablets (650 mg total) by mouth every 6 (six) hours as needed for mild pain (or Fever >/= 101).     ALPHAGAN P 0.1 % SOLN Place 1 drop into the right eye every 8 (eight) hours.     ALPRAZolam (XANAX) 0.5 MG tablet Take 0.5 mg by mouth 2 (two) times daily as needed for anxiety or sleep.     amLODipine (NORVASC) 10 MG tablet Take 1 tablet (10 mg total) by mouth daily. (Patient taking differently: Take 10 mg by mouth at bedtime.) 90 tablet 0   Ascorbic Acid (VITAMIN C PO) Take 1 tablet by mouth daily.     atenolol (TENORMIN) 50 MG tablet Take 1 tablet (50 mg total) by mouth daily. Pt needs appointment for further refills (Patient taking differently: Take 50 mg by mouth daily.) 30 tablet 0   B Complex Vitamins (B COMPLEX PO) Take 1 tablet by mouth daily.     Calcium Carbonate-Simethicone (MAALOX MAX PO) Take 1 Dose by mouth daily as needed (mild constipation).     HYDROcodone-acetaminophen (NORCO)  10-325 MG tablet Take 1 tablet by mouth 4 (four) times daily as needed for moderate pain or severe pain.   0   Magnesium Hydroxide (DULCOLAX SOFT CHEWS PO) Take 1 tablet by mouth daily as needed (mild constipation).     naloxone (NARCAN) nasal spray 4 mg/0.1 mL Place 1 spray into the nose once as needed for opioid reversal.     ROCKLATAN 0.02-0.005 % SOLN Place 1 drop into both eyes at bedtime.     SIMBRINZA 1-0.2 % SUSP Place 1 drop into the right eye 3 (three) times daily.     sodium chloride (OCEAN) 0.65 % SOLN nasal spray Place 1 spray into both nostrils daily as needed for congestion.     timolol (TIMOPTIC) 0.5 % ophthalmic solution Place 1 drop into both eyes daily.  0   No current facility-administered medications for this visit.    REVIEW OF SYSTEMS:   Constitutional: ( - ) fevers, ( - )  chills , ( - ) night sweats Eyes: ( - ) blurriness of vision, ( - ) double vision, ( - ) watery eyes Ears, nose, mouth, throat, and face: ( - ) mucositis, ( - ) sore throat Respiratory: ( - ) cough, ( - ) dyspnea, ( - ) wheezes Cardiovascular: ( - ) palpitation, ( - ) chest discomfort, ( - ) lower extremity swelling Gastrointestinal:  ( - ) nausea, ( - ) heartburn, ( - ) change in bowel habits Skin: ( - ) abnormal skin rashes Lymphatics: ( - ) new lymphadenopathy, ( - ) easy bruising Neurological: ( - ) numbness, ( - ) tingling, ( - ) new weaknesses Behavioral/Psych: ( - ) mood change, ( - ) new changes  All other systems were reviewed with the patient and are negative.  PHYSICAL EXAMINATION: ECOG PERFORMANCE STATUS: 1 - Symptomatic but completely ambulatory  Vitals:   01/25/22 1132  BP: 122/84  Pulse: 80  Resp: 16  Temp: 97.7 F (36.5 C)  SpO2: 100%   Filed Weights   01/25/22 1132  Weight: 191 lb 14.4 oz (87 kg)    GENERAL: Chronically ill-appearing elderly African-American male, alert, no distress and comfortable SKIN: skin color,  texture, turgor are normal, no rashes or  significant lesions EYES: conjunctiva are pink and non-injected, sclera clear LUNGS: clear to auscultation and percussion with normal breathing effort HEART: regular rate & rhythm and no murmurs and no lower extremity edema Musculoskeletal: no cyanosis of digits and no clubbing  PSYCH: alert & oriented x 3, fluent speech NEURO: no focal motor/sensory deficits  LABORATORY DATA:  I have reviewed the data as listed    Latest Ref Rng & Units 01/25/2022   11:08 AM 01/07/2022    7:55 AM 12/16/2021   12:01 PM  CBC  WBC 4.0 - 10.5 K/uL 2.8  3.5    Hemoglobin 13.0 - 17.0 g/dL 12.9  12.6  13.3    13.3   Hematocrit 39.0 - 52.0 % 37.9  38.7  39.0    39.0   Platelets 150 - 400 K/uL 103  71         Latest Ref Rng & Units 01/25/2022   11:08 AM 12/16/2021   12:01 PM 11/22/2021   10:30 AM  CMP  Glucose 70 - 99 mg/dL 104  111    111  120   BUN 8 - 23 mg/dL '28  21    21  17   '$ Creatinine 0.61 - 1.24 mg/dL 2.23  1.70    1.70  1.94   Sodium 135 - 145 mmol/L 141  141    141  140   Potassium 3.5 - 5.1 mmol/L 4.6  4.8    4.8  5.0   Chloride 98 - 111 mmol/L 108  107    107  109   CO2 22 - 32 mmol/L 28   22   Calcium 8.9 - 10.3 mg/dL 9.9   9.3   Total Protein 6.5 - 8.1 g/dL 7.0   6.9   Total Bilirubin 0.3 - 1.2 mg/dL 0.5   1.2   Alkaline Phos 38 - 126 U/L 66   48   AST 15 - 41 U/L 20   32   ALT 0 - 44 U/L 13   14     Lab Results  Component Value Date   MPROTEIN Not Observed 10/20/2021   Lab Results  Component Value Date   KPAFRELGTCHN 41.7 (H) 10/20/2021   LAMBDASER 20.6 10/20/2021   KAPLAMBRATIO 2.02 (H) 10/20/2021    RADIOGRAPHIC STUDIES: CT BONE MARROW BIOPSY & ASPIRATION  Result Date: 01/07/2022 INDICATION: Unexplained pancytopenia EXAM: CT GUIDED RIGHT ILIAC BONE MARROW ASPIRATION AND CORE BIOPSY Date:  01/07/2022 01/07/2022 9:47 am Radiologist:  Jerilynn Mages. Daryll Brod, MD Guidance:  CT FLUOROSCOPY: Fluoroscopy Time: None. MEDICATIONS: 1% lidocaine local ANESTHESIA/SEDATION: 2.0 mg IV  Versed; 100 mcg IV Fentanyl Moderate Sedation Time:  10 minute The patient was continuously monitored during the procedure by the interventional radiology nurse under my direct supervision. CONTRAST:  None. COMPLICATIONS: None PROCEDURE: Informed consent was obtained from the patient following explanation of the procedure, risks, benefits and alternatives. The patient understands, agrees and consents for the procedure. All questions were addressed. A time out was performed. The patient was positioned prone and non-contrast localization CT was performed of the pelvis to demonstrate the iliac marrow spaces. Maximal barrier sterile technique utilized including caps, mask, sterile gowns, sterile gloves, large sterile drape, hand hygiene, and Betadine prep. Under sterile conditions and local anesthesia, an 11 gauge coaxial bone biopsy needle was advanced into the right iliac marrow space. Needle position was confirmed with CT imaging. Initially, bone marrow aspiration was performed. Next, the 11  gauge outer cannula was utilized to obtain a right iliac bone marrow core biopsy. Needle was removed. Hemostasis was obtained with compression. The patient tolerated the procedure well. Samples were prepared with the cytotechnologist. No immediate complications. IMPRESSION: CT guided right iliac bone marrow aspiration and core biopsy. Electronically Signed   By: Jerilynn Mages.  Shick M.D.   On: 01/07/2022 11:04    ASSESSMENT & PLAN [name] 82 y.o. male with medical history significant for newly diagnosed MDS who presents for a follow up visit.   Today we discussed the results of the bone marrow biopsy.  Findings are consistent with a myelodysplastic syndrome, although other etiologies such as virus or inflammation can cause the findings.  At this time would recommend continued observation of his blood counts.  They do appear to be trending upward.  Will plan to have the patient return to clinic in 3 months for labs in 6 months  for clinic visits.  We discussed the nature of MDS and how observation would be appropriate given that his counts are currently quite mild.  He voices understanding of the plan moving forward.  #Thrombocytopenia/Leukopenia: # Mild Normocytic Anemia:  --Bone marrow biopsy showed findings concerning for myelodysplastic syndrome. --Recommend observation and monitoring of blood counts with consideration of EPO or transfusion if it becomes necessary.  At the moment counts are mildly low and therefore observation alone is appropriate. --Labs today show white blood cell count 2.8, hemoglobin 12.9, MCV 99.2, and platelets of 103. --RTC in 3 months for labs and 6 months for labs/clinic visit.   No orders of the defined types were placed in this encounter.   All questions were answered. The patient knows to call the clinic with any problems, questions or concerns.  A total of more than 30 minutes were spent on this encounter with face-to-face time and non-face-to-face time, including preparing to see the patient, ordering tests and/or medications, counseling the patient and coordination of care as outlined above.   Ledell Peoples, MD Department of Hematology/Oncology [place] at [facility] Phone: [phone] Pager: [phone] Email: [email or web address]  01/25/2022 1:53 PM

## 2022-03-17 ENCOUNTER — Encounter: Payer: Self-pay | Admitting: Podiatry

## 2022-03-17 ENCOUNTER — Ambulatory Visit: Payer: Medicare PPO | Admitting: Podiatry

## 2022-03-17 VITALS — BP 143/87 | HR 82

## 2022-03-17 DIAGNOSIS — M79675 Pain in left toe(s): Secondary | ICD-10-CM | POA: Diagnosis not present

## 2022-03-17 DIAGNOSIS — B351 Tinea unguium: Secondary | ICD-10-CM | POA: Diagnosis not present

## 2022-03-17 DIAGNOSIS — M79674 Pain in right toe(s): Secondary | ICD-10-CM

## 2022-03-17 NOTE — Progress Notes (Signed)
  Subjective:  Patient ID: [name], male    DOB: 10/09/1940,  MRN: [id number]  Chief Complaint  Patient presents with   Nail Problem    np - bil toe nail fungus    82 y.o. male presents with the above complaint. History confirmed with patient.  His nails are thickened elongated on multiple toenails and they cause quite a bit of pain in his shoes.  Objective:  Physical Exam: warm, good capillary refill, no trophic changes or ulcerative lesions, normal DP and PT pulses, and normal sensory exam. Left Foot: dystrophic yellowed discolored nail plates with subungual debris Right Foot: dystrophic yellowed discolored nail plates with subungual debris  Assessment:   1. Pain due to onychomycosis of toenails of both feet      Plan:  Patient was evaluated and treated and all questions answered.  Discussed the etiology and treatment options for the condition in detail with the patient. Educated patient on the topical and oral treatment options for mycotic nails. Recommended debridement of the nails today. Sharp and mechanical debridement performed of all painful and mycotic nails today. Nails debrided in length and thickness using a nail nipper to level of comfort. Discussed treatment options including appropriate shoe gear. Follow up as needed for painful nails.    Return if symptoms worsen or fail to improve.

## 2022-03-17 NOTE — Patient Instructions (Signed)
The nail trimmers I used are called a Nail Nipper. I would recommend a "double action" nail nipper

## 2022-04-26 ENCOUNTER — Other Ambulatory Visit: Payer: Self-pay

## 2022-04-26 ENCOUNTER — Other Ambulatory Visit: Payer: Self-pay | Admitting: Hematology and Oncology

## 2022-04-26 ENCOUNTER — Inpatient Hospital Stay: Payer: Medicare PPO | Attending: Physician Assistant

## 2022-04-26 DIAGNOSIS — Z79899 Other long term (current) drug therapy: Secondary | ICD-10-CM | POA: Diagnosis not present

## 2022-04-26 DIAGNOSIS — D469 Myelodysplastic syndrome, unspecified: Secondary | ICD-10-CM

## 2022-04-26 DIAGNOSIS — D72819 Decreased white blood cell count, unspecified: Secondary | ICD-10-CM | POA: Insufficient documentation

## 2022-04-26 DIAGNOSIS — D696 Thrombocytopenia, unspecified: Secondary | ICD-10-CM | POA: Insufficient documentation

## 2022-04-26 LAB — CMP (CANCER CENTER ONLY)
ALT: 11 U/L (ref 0–44)
AST: 18 U/L (ref 15–41)
Albumin: 4.2 g/dL (ref 3.5–5.0)
Alkaline Phosphatase: 50 U/L (ref 38–126)
Anion gap: 3 — ABNORMAL LOW (ref 5–15)
BUN: 22 mg/dL (ref 8–23)
CO2: 28 mmol/L (ref 22–32)
Calcium: 9.9 mg/dL (ref 8.9–10.3)
Chloride: 111 mmol/L (ref 98–111)
Creatinine: 2 mg/dL — ABNORMAL HIGH (ref 0.61–1.24)
GFR, Estimated: 33 mL/min — ABNORMAL LOW (ref >60)
Glucose, Bld: 102 mg/dL — ABNORMAL HIGH (ref 70–99)
Potassium: 4.7 mmol/L (ref 3.5–5.1)
Sodium: 142 mmol/L (ref 135–145)
Total Bilirubin: 0.6 mg/dL (ref 0.3–1.2)
Total Protein: 6.9 g/dL (ref 6.5–8.1)

## 2022-04-26 LAB — CBC WITH DIFFERENTIAL (CANCER CENTER ONLY)
Abs Immature Granulocytes: 0.04 10*3/uL (ref 0.00–0.07)
Basophils Absolute: 0 10*3/uL (ref 0.0–0.1)
Basophils Relative: 0 %
Eosinophils Absolute: 0 10*3/uL (ref 0.0–0.5)
Eosinophils Relative: 0 %
HCT: 37.3 % — ABNORMAL LOW (ref 39.0–52.0)
Hemoglobin: 12.7 g/dL — ABNORMAL LOW (ref 13.0–17.0)
Immature Granulocytes: 2 %
Lymphocytes Relative: 27 %
Lymphs Abs: 0.7 10*3/uL (ref 0.7–4.0)
MCH: 33.9 pg (ref 26.0–34.0)
MCHC: 34 g/dL (ref 30.0–36.0)
MCV: 99.5 fL (ref 80.0–100.0)
Monocytes Absolute: 0.5 10*3/uL (ref 0.1–1.0)
Monocytes Relative: 17 %
Neutro Abs: 1.5 10*3/uL — ABNORMAL LOW (ref 1.7–7.7)
Neutrophils Relative %: 54 %
Platelet Count: 68 10*3/uL — ABNORMAL LOW (ref 150–400)
RBC: 3.75 MIL/uL — ABNORMAL LOW (ref 4.22–5.81)
RDW: 12.8 % (ref 11.5–15.5)
WBC Count: 2.7 10*3/uL — ABNORMAL LOW (ref 4.0–10.5)
nRBC: 0 % (ref 0.0–0.2)

## 2022-04-27 ENCOUNTER — Telehealth: Payer: Self-pay | Admitting: *Deleted

## 2022-04-27 NOTE — Telephone Encounter (Signed)
Notified of message below

## 2022-04-27 NOTE — Telephone Encounter (Signed)
-----   Message from Kyra Searles, RN sent at 04/27/2022 10:05 AM EDT -----  ----- Message ----- From: Jaci Standard, MD Sent: 04/27/2022   9:36 AM EDT To: Kyra Searles, RN  Please let [name] know that his labs appear stable. His MDS appears stable. We will plan to see him back in July 2024.   ----- Message ----- From: Leory Plowman, [facility] [place] Sent: 04/26/2022  11:03 AM EDT To: Jaci Standard, MD

## 2022-07-26 ENCOUNTER — Inpatient Hospital Stay: Payer: Medicare PPO | Admitting: Hematology and Oncology

## 2022-07-26 ENCOUNTER — Other Ambulatory Visit: Payer: Self-pay

## 2022-07-26 ENCOUNTER — Inpatient Hospital Stay: Payer: Medicare PPO | Attending: Hematology and Oncology

## 2022-07-26 VITALS — BP 133/94 | HR 87 | Temp 98.1°F | Resp 16 | Wt 194.9 lb

## 2022-07-26 DIAGNOSIS — D539 Nutritional anemia, unspecified: Secondary | ICD-10-CM

## 2022-07-26 DIAGNOSIS — Z87440 Personal history of urinary (tract) infections: Secondary | ICD-10-CM | POA: Insufficient documentation

## 2022-07-26 DIAGNOSIS — Z8249 Family history of ischemic heart disease and other diseases of the circulatory system: Secondary | ICD-10-CM | POA: Diagnosis not present

## 2022-07-26 DIAGNOSIS — D469 Myelodysplastic syndrome, unspecified: Secondary | ICD-10-CM | POA: Diagnosis present

## 2022-07-26 DIAGNOSIS — Z79899 Other long term (current) drug therapy: Secondary | ICD-10-CM | POA: Insufficient documentation

## 2022-07-26 DIAGNOSIS — D61818 Other pancytopenia: Secondary | ICD-10-CM | POA: Diagnosis not present

## 2022-07-26 DIAGNOSIS — Z9079 Acquired absence of other genital organ(s): Secondary | ICD-10-CM | POA: Insufficient documentation

## 2022-07-26 DIAGNOSIS — Z923 Personal history of irradiation: Secondary | ICD-10-CM | POA: Insufficient documentation

## 2022-07-26 DIAGNOSIS — H5440 Blindness, one eye, unspecified eye: Secondary | ICD-10-CM | POA: Diagnosis not present

## 2022-07-26 DIAGNOSIS — R350 Frequency of micturition: Secondary | ICD-10-CM | POA: Insufficient documentation

## 2022-07-26 DIAGNOSIS — Z8673 Personal history of transient ischemic attack (TIA), and cerebral infarction without residual deficits: Secondary | ICD-10-CM | POA: Insufficient documentation

## 2022-07-26 DIAGNOSIS — N401 Enlarged prostate with lower urinary tract symptoms: Secondary | ICD-10-CM | POA: Insufficient documentation

## 2022-07-26 DIAGNOSIS — Z823 Family history of stroke: Secondary | ICD-10-CM | POA: Insufficient documentation

## 2022-07-26 DIAGNOSIS — Z87891 Personal history of nicotine dependence: Secondary | ICD-10-CM | POA: Insufficient documentation

## 2022-07-26 DIAGNOSIS — Z882 Allergy status to sulfonamides status: Secondary | ICD-10-CM | POA: Diagnosis not present

## 2022-07-26 DIAGNOSIS — D696 Thrombocytopenia, unspecified: Secondary | ICD-10-CM

## 2022-07-26 DIAGNOSIS — Z832 Family history of diseases of the blood and blood-forming organs and certain disorders involving the immune mechanism: Secondary | ICD-10-CM | POA: Diagnosis not present

## 2022-07-26 DIAGNOSIS — Z86010 Personal history of colonic polyps: Secondary | ICD-10-CM | POA: Insufficient documentation

## 2022-07-26 DIAGNOSIS — Z8546 Personal history of malignant neoplasm of prostate: Secondary | ICD-10-CM | POA: Diagnosis not present

## 2022-07-26 LAB — CMP (CANCER CENTER ONLY)
ALT: 14 U/L (ref 0–44)
AST: 20 U/L (ref 15–41)
Albumin: 4.4 g/dL (ref 3.5–5.0)
Alkaline Phosphatase: 58 U/L (ref 38–126)
Anion gap: 6 (ref 5–15)
BUN: 16 mg/dL (ref 8–23)
CO2: 26 mmol/L (ref 22–32)
Calcium: 9.9 mg/dL (ref 8.9–10.3)
Chloride: 111 mmol/L (ref 98–111)
Creatinine: 1.87 mg/dL — ABNORMAL HIGH (ref 0.61–1.24)
GFR, Estimated: 36 mL/min — ABNORMAL LOW (ref >60)
Glucose, Bld: 107 mg/dL — ABNORMAL HIGH (ref 70–99)
Potassium: 4.3 mmol/L (ref 3.5–5.1)
Sodium: 143 mmol/L (ref 135–145)
Total Bilirubin: 0.8 mg/dL (ref 0.3–1.2)
Total Protein: 7.1 g/dL (ref 6.5–8.1)

## 2022-07-26 LAB — CBC WITH DIFFERENTIAL (CANCER CENTER ONLY)
Abs Immature Granulocytes: 0.04 10*3/uL (ref 0.00–0.07)
Basophils Absolute: 0 10*3/uL (ref 0.0–0.1)
Basophils Relative: 0 %
Eosinophils Absolute: 0 10*3/uL (ref 0.0–0.5)
Eosinophils Relative: 0 %
HCT: 40.2 % (ref 39.0–52.0)
Hemoglobin: 13.4 g/dL (ref 13.0–17.0)
Immature Granulocytes: 1 %
Lymphocytes Relative: 10 %
Lymphs Abs: 0.5 10*3/uL — ABNORMAL LOW (ref 0.7–4.0)
MCH: 33.4 pg (ref 26.0–34.0)
MCHC: 33.3 g/dL (ref 30.0–36.0)
MCV: 100.2 fL — ABNORMAL HIGH (ref 80.0–100.0)
Monocytes Absolute: 0.9 10*3/uL (ref 0.1–1.0)
Monocytes Relative: 19 %
Neutro Abs: 3.3 10*3/uL (ref 1.7–7.7)
Neutrophils Relative %: 70 %
Platelet Count: 97 10*3/uL — ABNORMAL LOW (ref 150–400)
RBC: 4.01 MIL/uL — ABNORMAL LOW (ref 4.22–5.81)
RDW: 12.3 % (ref 11.5–15.5)
WBC Count: 4.7 10*3/uL (ref 4.0–10.5)
nRBC: 0 % (ref 0.0–0.2)

## 2022-07-26 MED ORDER — GABAPENTIN 300 MG PO CAPS: 300.0000 mg | ORAL_CAPSULE | Freq: Three times a day (TID) | ORAL | 3 refills | Status: AC

## 2022-07-26 NOTE — Progress Notes (Signed)
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  01/07/2022: bone marrow biopsy performed, showed hypercellular bone marrow (average 40%) with orderly myeloid and erythroid maturation and mild dysmorphic megakaryocytes   Interval History:  [name] 82 y.o. male with medical history significant for newly diagnosed MDS who presents for a follow up visit. The patient's last visit was on 01/25/2022. In the interim since the last visit he has had no major changes in his health.  On exam today [name] is accompanied by his wife.  He reports he has been well overall in the interim since her last visit.  He notes overall his health has been steady with no big ups or downs.  He notes his energy is low and that is because he is "very inactive".  He reports he does not have much motivation and he "stays in bed too long".  He realizes that if he would exercise he would feel better but just lacks the motivation.  He has been having some urinary issues with frequent urination.  He denies any lightheadedness, dizziness, shortness of breath.  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  He notes he would like a refill of his gabapentin which seems to help with his leg pain.  He reports overall he is at his baseline level of health.  He denies any fevers, chills, sweats, nausea, vomiting or diarrhea.  He otherwise has a negative 10 point ROS.  MEDICAL HISTORY:  Past Medical History:  Diagnosis Date   Anemia in chronic kidney disease (CKD)    Anxiety    Arthritis    Chronic  back pain    Chronic constipation    CKD (chronic kidney disease), stage III (HCC)    Dyslipidemia    Eczema    Foley catheter in place    GERD (gastroesophageal reflux disease)    Glaucoma, both eyes    Herniated disc    History of adenomatous polyp of colon    History of external beam radiation therapy    06-07-2012  to  08-02-2012;   prostate, 7800 cGy/40 sessions/ 5600cGy pelvic lymph nodes/40 sessions   History of kidney stones    History of prostate cancer 03/03/2012   urologist--- dr Hillis Range;  dx 03/ 2014,  cT2b, Gleason 3+3;  completed IMRT 08-02-2012   History of sepsis    hx of several--- last one 07-20-2020 due to complicated UTI secondary stone obstruction;   11-18-2021 admission sepsis UTI + urine and blood   History of TIA (transient ischemic attack) 12/2008   no residual   Hydrocele, bilateral    Hypertension    Impaired memory    Legally blind in right eye, as defined in [place]    due to glaucoma   Leukopenia    Thrombocytopenia (HCC)    hematology/ ocnologsit--- dr Lewi Drost/ Georga Kaufmann PA   Urinary incontinence    Urinary retention    Weakness of both legs    per pt wife , pt's uses cane at times   Wears dentures    upper    SURGICAL HISTORY: Past Surgical History:  Procedure Laterality Date   BILIARY STENT PLACEMENT  06/15/2011   Procedure: BILIARY STENT PLACEMENT;  Surgeon: Marcine Matar, MD;  Location: [facility];  Service: Urology;  Laterality: Bilateral;   CYSTO/ BILATERAL RETROGRADE PYELOGRAM/ LEFT URETERSCOPIC STONE EXTRACTION  11/14/2005   LEFT URETERAL CALCULUS   CYSTO/ BILATERAL URETERAL STENTS  11/06/2005   @[facility]    CYSTO/ LEFT RETROGRADE PYELOGRAM/  LEFT STENT PLACMENT  05/21/2011   @[facility]    CYSTOSCOPY W/ URETERAL STENT PLACEMENT Right 07/20/2020   Procedure: CYSTOSCOPY WITH RETROGRADE PYELOGRAM/URETERAL STENT PLACEMENT;  Surgeon: Rene Paci, MD;  Location: [facility];  Service: Urology;  Laterality: Right;    CYSTOSCOPY W/ URETERAL STENT REMOVAL  06/15/2011   Procedure: CYSTOSCOPY WITH STENT REMOVAL;  Surgeon: Marcine Matar, MD;  Location: [facility];  Service: Urology;  Laterality: Left;   CYSTOSCOPY W/ URETERAL STENT REMOVAL Right 08/24/2020   Procedure: CYSTOSCOPY WITH STENT REMOVAL;  Surgeon: Marcine Matar, MD;  Location[facility];  Service: Urology;  Laterality: Right;   CYSTOSCOPY WITH RETROGRADE PYELOGRAM, URETEROSCOPY AND STENT PLACEMENT Right 08/24/2020   Procedure: CYSTOSCOPY WITH RETROGRADE PYELOGRAM, URETEROSCOPY, STONE EXTRACTION  AND STENT REPLACEMENT;  Surgeon: Marcine Matar, MD;  Location: [facility];  Service: Urology;  Laterality: Right;  2 HRS   CYSTOSCOPY WITH URETHRAL DILATATION N/A 12/16/2021   Procedure: CYSTOSCOPY WITH  OPTILUME URETHRAL DILATATION;  Surgeon: Marcine Matar, MD;  Location: [facility];  Service: Urology;  Laterality: N/A;  30 MINS   EXTRACORPOREAL SHOCK WAVE LITHOTRIPSY     HOLMIUM LASER APPLICATION Right 08/24/2020   Procedure: HOLMIUM LASER APPLICATION;  Surgeon: Marcine Matar, MD;  Location: [facility];  Service: Urology;  Laterality: Right;   INTERNAL URETHROTOMY/ TURP  12/23/2002   BPH W/ STRICTURE   PERCUTANEOUS NEPHROLITHOTOMY Bilateral    left 01-13-2010 and 01-29-2010;  right 11-07-2005 , 11-14-2005, and 12-09-2005   @[facility]    REPAIR LEFT INGUINAL HERNIA W/ MESH  12/23/2002   @[facility]    RIGHT URETERAL STENT PLACEMENT  02/10/2003   @[facility]    RIGHT URETEROSCOPIC STONE EXTRACTION  05/29/2003   @[facility]    URETEROSCOPY  06/15/2011   Procedure: URETEROSCOPY;  Surgeon: Marcine Matar, MD;  Location: [facility];  Service: Urology;  Laterality: Bilateral;    SOCIAL HISTORY: Social History   Socioeconomic History   Marital status: Married    Spouse name: Not on file   Number of children: Not on file   Years of education: Not on file   Highest  education level: Not on file  Occupational History   Not on file  Tobacco Use   Smoking status: Former    Current packs/day: 0.00    Types: Cigarettes    Start date: 06/10/1950    Quit date: 06/10/1990    Years since quitting: 32.1   Smokeless tobacco: Never  Vaping Use   Vaping status: Never Used  Substance and Sexual Activity   Alcohol use: No   Drug use: Never   Sexual activity: Yes    Birth control/protection: None  Other Topics Concern   Not on file  Social History Narrative   Not on file   Social Determinants of Health   Financial Resource Strain: Not on file  Food Insecurity: Not on file  Transportation Needs: Not on file  Physical Activity: Not on file  Stress: Not on file  Social Connections: Not on file  Intimate Partner Violence: Not on file    FAMILY  HISTORY: Family History  Problem Relation Age of Onset   Hypertension Mother    Stroke Father    Hypertension Sister    Lupus Sister    Hypertension Brother     ALLERGIES:  is allergic to tape, doxycycline hyclate, and sulfa antibiotics.  MEDICATIONS:  Current Outpatient Medications  Medication Sig Dispense Refill   acetaminophen (TYLENOL) 325 MG tablet Take 2 tablets (650 mg total) by mouth every 6 (six) hours as needed for mild pain (or Fever >/= 101).     ALPHAGAN P 0.1 % SOLN Place 1 drop into the right eye every 8 (eight) hours.     ALPRAZolam (XANAX) 0.5 MG tablet Take 0.5 mg by mouth 2 (two) times daily as needed for anxiety or sleep.     amLODipine (NORVASC) 10 MG tablet Take 1 tablet (10 mg total) by mouth daily. (Patient taking differently: Take 10 mg by mouth at bedtime.) 90 tablet 0   Ascorbic Acid (VITAMIN C PO) Take 1 tablet by mouth daily.     atenolol (TENORMIN) 50 MG tablet Take 1 tablet (50 mg total) by mouth daily. Pt needs appointment for further refills (Patient taking differently: Take 50 mg by mouth daily.) 30 tablet 0   B Complex Vitamins (B COMPLEX PO) Take 1 tablet by mouth daily.      Calcium Carbonate-Simethicone (MAALOX MAX PO) Take 1 Dose by mouth daily as needed (mild constipation).     gabapentin (NEURONTIN) 300 MG capsule Take 1 capsule (300 mg total) by mouth 3 (three) times daily. 90 capsule 3   gatifloxacin (ZYMAXID) 0.5 % SOLN Place 1 drop into the right eye 4 (four) times daily.     HYDROcodone-acetaminophen (NORCO) 10-325 MG tablet Take 1 tablet by mouth 4 (four) times daily as needed for moderate pain or severe pain.   0   ketorolac (ACULAR) 0.5 % ophthalmic solution Place 1 drop into the right eye 4 (four) times daily.     Magnesium Hydroxide (DULCOLAX SOFT CHEWS PO) Take 1 tablet by mouth daily as needed (mild constipation).     naloxone (NARCAN) nasal spray 4 mg/0.1 mL Place 1 spray into the nose once as needed for opioid reversal.     prednisoLONE acetate (PRED FORTE) 1 % ophthalmic suspension Place 1 drop into the right eye 4 (four) times daily.     ROCKLATAN 0.02-0.005 % SOLN Place 1 drop into both eyes at bedtime.     SIMBRINZA 1-0.2 % SUSP Place 1 drop into the right eye 3 (three) times daily.     sodium chloride (OCEAN) 0.65 % SOLN nasal spray Place 1 spray into both nostrils daily as needed for congestion.     timolol (BETIMOL) 0.5 % ophthalmic solution Apply to eye.     No current facility-administered medications for this visit.    REVIEW OF SYSTEMS:   Constitutional: ( - ) fevers, ( - )  chills , ( - ) night sweats Eyes: ( - ) blurriness of vision, ( - ) double vision, ( - ) watery eyes Ears, nose, mouth, throat, and face: ( - ) mucositis, ( - ) sore throat Respiratory: ( - ) cough, ( - ) dyspnea, ( - ) wheezes Cardiovascular: ( - ) palpitation, ( - ) chest discomfort, ( - ) lower extremity swelling Gastrointestinal:  ( - ) nausea, ( - ) heartburn, ( - ) change in bowel habits Skin: ( - ) abnormal skin rashes Lymphatics: ( - ) new lymphadenopathy, ( - )  easy bruising Neurological: ( - ) numbness, ( - ) tingling, ( - ) new  weaknesses Behavioral/Psych: ( - ) mood change, ( - ) new changes  All other systems were reviewed with the patient and are negative.  PHYSICAL EXAMINATION: ECOG PERFORMANCE STATUS: 1 - Symptomatic but completely ambulatory  Vitals:   07/26/22 1056  BP: (!) 133/94  Pulse: 87  Resp: 16  Temp: 98.1 F (36.7 C)  SpO2: 100%    Filed Weights   07/26/22 1056  Weight: 194 lb 14.4 oz (88.4 kg)     GENERAL: Chronically ill-appearing elderly African-American male, alert, no distress and comfortable SKIN: skin color, texture, turgor are normal, no rashes or significant lesions EYES: conjunctiva are pink and non-injected, sclera clear LUNGS: clear to auscultation and percussion with normal breathing effort HEART: regular rate & rhythm and no murmurs and no lower extremity edema Musculoskeletal: no cyanosis of digits and no clubbing  PSYCH: alert & oriented x 3, fluent speech NEURO: no focal motor/sensory deficits  LABORATORY DATA:  I have reviewed the data as listed    Latest Ref Rng & Units 07/26/2022   10:41 AM 04/26/2022   10:49 AM 01/25/2022   11:08 AM  CBC  WBC 4.0 - 10.5 K/uL 4.7  2.7  2.8   Hemoglobin 13.0 - 17.0 g/dL 16.1  09.6  04.5   Hematocrit 39.0 - 52.0 % 40.2  37.3  37.9   Platelets 150 - 400 K/uL 97  68  103        Latest Ref Rng & Units 07/26/2022   10:41 AM 04/26/2022   10:49 AM 01/25/2022   11:08 AM  CMP  Glucose 70 - 99 mg/dL 409  811  914   BUN 8 - 23 mg/dL 16  22  28    Creatinine 0.61 - 1.24 mg/dL 7.82  9.56  2.13   Sodium 135 - 145 mmol/L 143  142  141   Potassium 3.5 - 5.1 mmol/L 4.3  4.7  4.6   Chloride 98 - 111 mmol/L 111  111  108   CO2 22 - 32 mmol/L 26  28  28    Calcium 8.9 - 10.3 mg/dL 9.9  9.9  9.9   Total Protein 6.5 - 8.1 g/dL 7.1  6.9  7.0   Total Bilirubin 0.3 - 1.2 mg/dL 0.8  0.6  0.5   Alkaline Phos 38 - 126 U/L 58  50  66   AST 15 - 41 U/L 20  18  20    ALT 0 - 44 U/L 14  11  13      Lab Results  Component Value Date   MPROTEIN Not  Observed 10/20/2021   Lab Results  Component Value Date   KPAFRELGTCHN 41.7 (H) 10/20/2021   LAMBDASER 20.6 10/20/2021   KAPLAMBRATIO 2.02 (H) 10/20/2021    RADIOGRAPHIC STUDIES: No results found.  ASSESSMENT & PLAN SHAWNDELL SCHILLACI 82 y.o. male with medical history significant for newly diagnosed MDS who presents for a follow up visit.   Previously we discussed the results of the bone marrow biopsy.  Findings are consistent with a myelodysplastic syndrome, although other etiologies such as virus or inflammation can cause the findings.  At this time would recommend continued observation of his blood counts.  They do appear to be trending upward.  Will plan to have the patient return to clinic in 3 months for labs in 6 months for clinic visits.  We discussed the nature of MDS and  how observation would be appropriate given that his counts are currently quite mild.  He voices understanding of the plan moving forward.  #Thrombocytopenia/Leukopenia: # Mild Normocytic Anemia:  --Bone marrow biopsy showed findings concerning for myelodysplastic syndrome. --Recommend observation and monitoring of blood counts with consideration of EPO or transfusion if it becomes necessary.  At the moment counts are mildly low and therefore observation alone is appropriate. --Labs today show white blood cell count 4.7, hemoglobin 13.4, MCV 100.2, and platelets of 97 --RTC in 6 months for labs/clinic visit.   No orders of the defined types were placed in this encounter.   All questions were answered. The patient knows to call the clinic with any problems, questions or concerns.  A total of more than 25 minutes were spent on this encounter with face-to-face time and non-face-to-face time, including preparing to see the patient, ordering tests and/or medications, counseling the patient and coordination of care as outlined above.   Ulysees Barns, MD Department of Hematology/Oncology [facility] at  [facility] Phone: [phone] Pager: [phone] Email: [email or web address]  07/26/2022 11:34 AM

## 2022-10-28 DIAGNOSIS — G4700 Insomnia, unspecified: Secondary | ICD-10-CM | POA: Diagnosis not present

## 2022-10-28 DIAGNOSIS — I10 Essential (primary) hypertension: Secondary | ICD-10-CM | POA: Diagnosis not present

## 2022-10-28 DIAGNOSIS — F419 Anxiety disorder, unspecified: Secondary | ICD-10-CM | POA: Diagnosis not present

## 2022-10-28 DIAGNOSIS — M5416 Radiculopathy, lumbar region: Secondary | ICD-10-CM | POA: Diagnosis not present

## 2022-10-28 DIAGNOSIS — N3946 Mixed incontinence: Secondary | ICD-10-CM | POA: Diagnosis not present

## 2022-10-28 DIAGNOSIS — R030 Elevated blood-pressure reading, without diagnosis of hypertension: Secondary | ICD-10-CM | POA: Diagnosis not present

## 2022-10-28 DIAGNOSIS — G8929 Other chronic pain: Secondary | ICD-10-CM | POA: Diagnosis not present

## 2022-10-28 DIAGNOSIS — F330 Major depressive disorder, recurrent, mild: Secondary | ICD-10-CM | POA: Diagnosis not present

## 2022-11-28 DIAGNOSIS — R030 Elevated blood-pressure reading, without diagnosis of hypertension: Secondary | ICD-10-CM | POA: Diagnosis not present

## 2022-11-28 DIAGNOSIS — F330 Major depressive disorder, recurrent, mild: Secondary | ICD-10-CM | POA: Diagnosis not present

## 2022-11-28 DIAGNOSIS — M5416 Radiculopathy, lumbar region: Secondary | ICD-10-CM | POA: Diagnosis not present

## 2022-11-28 DIAGNOSIS — G8929 Other chronic pain: Secondary | ICD-10-CM | POA: Diagnosis not present

## 2022-11-28 DIAGNOSIS — R110 Nausea: Secondary | ICD-10-CM | POA: Diagnosis not present

## 2022-11-28 DIAGNOSIS — G4700 Insomnia, unspecified: Secondary | ICD-10-CM | POA: Diagnosis not present

## 2022-11-28 DIAGNOSIS — I10 Essential (primary) hypertension: Secondary | ICD-10-CM | POA: Diagnosis not present

## 2022-11-28 DIAGNOSIS — N3946 Mixed incontinence: Secondary | ICD-10-CM | POA: Diagnosis not present

## 2022-12-23 DIAGNOSIS — R110 Nausea: Secondary | ICD-10-CM | POA: Diagnosis not present

## 2022-12-23 DIAGNOSIS — G4700 Insomnia, unspecified: Secondary | ICD-10-CM | POA: Diagnosis not present

## 2022-12-23 DIAGNOSIS — R030 Elevated blood-pressure reading, without diagnosis of hypertension: Secondary | ICD-10-CM | POA: Diagnosis not present

## 2022-12-23 DIAGNOSIS — N3946 Mixed incontinence: Secondary | ICD-10-CM | POA: Diagnosis not present

## 2022-12-23 DIAGNOSIS — G8929 Other chronic pain: Secondary | ICD-10-CM | POA: Diagnosis not present

## 2022-12-23 DIAGNOSIS — M5416 Radiculopathy, lumbar region: Secondary | ICD-10-CM | POA: Diagnosis not present

## 2022-12-23 DIAGNOSIS — F330 Major depressive disorder, recurrent, mild: Secondary | ICD-10-CM | POA: Diagnosis not present

## 2022-12-23 DIAGNOSIS — I10 Essential (primary) hypertension: Secondary | ICD-10-CM | POA: Diagnosis not present

## 2022-12-23 DIAGNOSIS — F419 Anxiety disorder, unspecified: Secondary | ICD-10-CM | POA: Diagnosis not present

## 2023-01-24 ENCOUNTER — Other Ambulatory Visit: Payer: Self-pay | Admitting: Physician Assistant

## 2023-01-24 DIAGNOSIS — D469 Myelodysplastic syndrome, unspecified: Secondary | ICD-10-CM

## 2023-01-24 NOTE — Progress Notes (Deleted)
[facility] [phone]   [phone]  PROGRESS NOTE  Patient Care Team: Paschal Dopp, PA as PCP - General (Physician Assistant) Jaci Standard, MD as Consulting Physician (Hematology and Oncology)  Hematological/Oncological History # Myelodysplastic Syndrome -06/25/2021: Labs from PCP, Merryl Hacker NP:WBC 2.5 (L), Hgb 12.8, MCV 103(H), Plt 76K (L) -10/20/2021: Establish care with [facility].  01/07/2022: bone marrow biopsy performed, showed hypercellular bone marrow (average 40%) with orderly myeloid and erythroid maturation and mild dysmorphic megakaryocytes   Interval History:  [name] 83 y.o. male with medical history significant for newly diagnosed MDS who presents for a follow up visit. The patient's last visit was on 07/26/2022. In the interim since the last visit he has had no major changes in his health.  On exam today [name] is accompanied by his wife. ***    He reports he has been well overall in the interim since her last visit.  He notes overall his health has been steady with no big ups or downs.  He notes his energy is low and that is because he is "very inactive".  He reports he does not have much motivation and he "stays in bed too long".  He realizes that if he would exercise he would feel better but just lacks the motivation.  He has been having some urinary issues with frequent urination.  He denies any lightheadedness, dizziness, shortness of breath.  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  He notes he would like a refill of his gabapentin which seems to help with his leg pain.  He reports overall he is at his baseline level of health.  He denies any fevers, chills, sweats, nausea, vomiting or diarrhea.  He otherwise has a negative 10 point ROS.  MEDICAL HISTORY:  Past Medical History:  Diagnosis Date   Anemia in chronic kidney disease (CKD)    Anxiety    Arthritis     Chronic back pain    Chronic constipation    CKD (chronic kidney disease), stage III (HCC)    Dyslipidemia    Eczema    Foley catheter in place    GERD (gastroesophageal reflux disease)    Glaucoma, both eyes    Herniated disc    History of adenomatous polyp of colon    History of external beam radiation therapy    06-07-2012  to  08-02-2012;   prostate, 7800 cGy/40 sessions/ 5600cGy pelvic lymph nodes/40 sessions   History of kidney stones    History of prostate cancer 03/03/2012   urologist--- dr Hillis Range;  dx 03/ 2014,  cT2b, Gleason 3+3;  completed IMRT 08-02-2012   History of sepsis    hx of several--- last one 07-20-2020 due to complicated UTI secondary stone obstruction;   11-18-2021 admission sepsis UTI + urine and blood   History of TIA (transient ischemic attack) 12/2008   no residual   Hydrocele, bilateral    Hypertension    Impaired memory    Legally blind in right eye, as defined in [place]    due to glaucoma   Leukopenia    Thrombocytopenia (HCC)    hematology/ ocnologsit--- dr dorsey/ Georga Kaufmann PA   Urinary incontinence    Urinary retention    Weakness of both legs    per pt wife , pt's uses cane at times   Wears dentures    upper    SURGICAL HISTORY:  Past Surgical History:  Procedure Laterality Date   BILIARY STENT PLACEMENT  06/15/2011   Procedure: BILIARY STENT PLACEMENT;  Surgeon: Marcine Matar, MD;  Location: [facility];  Service: Urology;  Laterality: Bilateral;   CYSTO/ BILATERAL RETROGRADE PYELOGRAM/ LEFT URETERSCOPIC STONE EXTRACTION  11/14/2005   LEFT URETERAL CALCULUS   CYSTO/ BILATERAL URETERAL STENTS  11/06/2005   @[facility]    CYSTO/ LEFT RETROGRADE PYELOGRAM/  LEFT STENT PLACMENT  05/21/2011   @[facility]    CYSTOSCOPY W/ URETERAL STENT PLACEMENT Right 07/20/2020   Procedure: CYSTOSCOPY WITH RETROGRADE PYELOGRAM/URETERAL STENT PLACEMENT;  Surgeon: Rene Paci, MD;  Location: [facility];  Service: Urology;  Laterality: Right;    CYSTOSCOPY W/ URETERAL STENT REMOVAL  06/15/2011   Procedure: CYSTOSCOPY WITH STENT REMOVAL;  Surgeon: Marcine Matar, MD;  Location: [facility];  Service: Urology;  Laterality: Left;   CYSTOSCOPY W/ URETERAL STENT REMOVAL Right 08/24/2020   Procedure: CYSTOSCOPY WITH STENT REMOVAL;  Surgeon: Marcine Matar, MD;  Location: [facility];  Service: Urology;  Laterality: Right;   CYSTOSCOPY WITH RETROGRADE PYELOGRAM, URETEROSCOPY AND STENT PLACEMENT Right 08/24/2020   Procedure: CYSTOSCOPY WITH RETROGRADE PYELOGRAM, URETEROSCOPY, STONE EXTRACTION  AND STENT REPLACEMENT;  Surgeon: Marcine Matar, MD;  Location: [facility];  Service: Urology;  Laterality: Right;  2 HRS   CYSTOSCOPY WITH URETHRAL DILATATION N/A 12/16/2021   Procedure: CYSTOSCOPY WITH  OPTILUME URETHRAL DILATATION;  Surgeon: Marcine Matar, MD;  Location: [facility];  Service: Urology;  Laterality: N/A;  30 MINS   EXTRACORPOREAL SHOCK WAVE LITHOTRIPSY     HOLMIUM LASER APPLICATION Right 08/24/2020   Procedure: HOLMIUM LASER APPLICATION;  Surgeon: Marcine Matar, MD;  Location: [facility];  Service: Urology;  Laterality: Right;   INTERNAL URETHROTOMY/ TURP  12/23/2002   BPH W/ STRICTURE   PERCUTANEOUS NEPHROLITHOTOMY Bilateral    left 01-13-2010 and 01-29-2010;  right 11-07-2005 , 11-14-2005, and 12-09-2005   @[facility]    REPAIR LEFT INGUINAL HERNIA W/ MESH  12/23/2002   @[facility]    RIGHT URETERAL STENT PLACEMENT  02/10/2003   @[facility]    RIGHT URETEROSCOPIC STONE EXTRACTION  05/29/2003   @[facility]    URETEROSCOPY  06/15/2011   Procedure: URETEROSCOPY;  Surgeon: Marcine Matar, MD;  Location: [facility];  Service: Urology;  Laterality: Bilateral;    SOCIAL HISTORY: Social History   Socioeconomic History   Marital status: Married    Spouse name: Not on file   Number of children: Not on file   Years of education: Not on file   Highest  education level: Not on file  Occupational History   Not on file  Tobacco Use   Smoking status: Former    Current packs/day: 0.00    Types: Cigarettes    Start date: 06/10/1950    Quit date: 06/10/1990    Years since quitting: 32.6   Smokeless tobacco: Never  Vaping Use   Vaping status: Never Used  Substance and Sexual Activity   Alcohol use: No   Drug use: Never   Sexual activity: Yes    Birth control/protection: None  Other Topics Concern   Not on file  Social History Narrative   Not on file   Social Drivers of Health   Financial Resource Strain: Not on file  Food Insecurity: Not on file  Transportation Needs: Not on file  Physical Activity: Not on file  Stress: Not on file  Social Connections: Not on file  Intimate Partner Violence: Not on file  FAMILY HISTORY: Family History  Problem Relation Age of Onset   Hypertension Mother    Stroke Father    Hypertension Sister    Lupus Sister    Hypertension Brother     ALLERGIES:  is allergic to tape, doxycycline hyclate, and sulfa antibiotics.  MEDICATIONS:  Current Outpatient Medications  Medication Sig Dispense Refill   acetaminophen (TYLENOL) 325 MG tablet Take 2 tablets (650 mg total) by mouth every 6 (six) hours as needed for mild pain (or Fever >/= 101).     ALPHAGAN P 0.1 % SOLN Place 1 drop into the right eye every 8 (eight) hours.     ALPRAZolam (XANAX) 0.5 MG tablet Take 0.5 mg by mouth 2 (two) times daily as needed for anxiety or sleep.     amLODipine (NORVASC) 10 MG tablet Take 1 tablet (10 mg total) by mouth daily. (Patient taking differently: Take 10 mg by mouth at bedtime.) 90 tablet 0   Ascorbic Acid (VITAMIN C PO) Take 1 tablet by mouth daily.     atenolol (TENORMIN) 50 MG tablet Take 1 tablet (50 mg total) by mouth daily. Pt needs appointment for further refills (Patient taking differently: Take 50 mg by mouth daily.) 30 tablet 0   B Complex Vitamins (B COMPLEX PO) Take 1 tablet by mouth daily.      Calcium Carbonate-Simethicone (MAALOX MAX PO) Take 1 Dose by mouth daily as needed (mild constipation).     gabapentin (NEURONTIN) 300 MG capsule Take 1 capsule (300 mg total) by mouth 3 (three) times daily. 90 capsule 3   gatifloxacin (ZYMAXID) 0.5 % SOLN Place 1 drop into the right eye 4 (four) times daily.     HYDROcodone-acetaminophen (NORCO) 10-325 MG tablet Take 1 tablet by mouth 4 (four) times daily as needed for moderate pain or severe pain.   0   ketorolac (ACULAR) 0.5 % ophthalmic solution Place 1 drop into the right eye 4 (four) times daily.     Magnesium Hydroxide (DULCOLAX SOFT CHEWS PO) Take 1 tablet by mouth daily as needed (mild constipation).     naloxone (NARCAN) nasal spray 4 mg/0.1 mL Place 1 spray into the nose once as needed for opioid reversal.     prednisoLONE acetate (PRED FORTE) 1 % ophthalmic suspension Place 1 drop into the right eye 4 (four) times daily.     ROCKLATAN 0.02-0.005 % SOLN Place 1 drop into both eyes at bedtime.     SIMBRINZA 1-0.2 % SUSP Place 1 drop into the right eye 3 (three) times daily.     sodium chloride (OCEAN) 0.65 % SOLN nasal spray Place 1 spray into both nostrils daily as needed for congestion.     timolol (BETIMOL) 0.5 % ophthalmic solution Apply to eye.     No current facility-administered medications for this visit.    REVIEW OF SYSTEMS:   Constitutional: ( - ) fevers, ( - )  chills , ( - ) night sweats Eyes: ( - ) blurriness of vision, ( - ) double vision, ( - ) watery eyes Ears, nose, mouth, throat, and face: ( - ) mucositis, ( - ) sore throat Respiratory: ( - ) cough, ( - ) dyspnea, ( - ) wheezes Cardiovascular: ( - ) palpitation, ( - ) chest discomfort, ( - ) lower extremity swelling Gastrointestinal:  ( - ) nausea, ( - ) heartburn, ( - ) change in bowel habits Skin: ( - ) abnormal skin rashes Lymphatics: ( - ) new lymphadenopathy, ( - )  easy bruising Neurological: ( - ) numbness, ( - ) tingling, ( - ) new  weaknesses Behavioral/Psych: ( - ) mood change, ( - ) new changes  All other systems were reviewed with the patient and are negative.  PHYSICAL EXAMINATION: ECOG PERFORMANCE STATUS: 1 - Symptomatic but completely ambulatory  There were no vitals filed for this visit.   There were no vitals filed for this visit.    GENERAL: Chronically ill-appearing elderly African-American male, alert, no distress and comfortable SKIN: skin color, texture, turgor are normal, no rashes or significant lesions EYES: conjunctiva are pink and non-injected, sclera clear LUNGS: clear to auscultation and percussion with normal breathing effort HEART: regular rate & rhythm and no murmurs and no lower extremity edema Musculoskeletal: no cyanosis of digits and no clubbing  PSYCH: alert & oriented x 3, fluent speech NEURO: no focal motor/sensory deficits  LABORATORY DATA:  I have reviewed the data as listed    Latest Ref Rng & Units 07/26/2022   10:41 AM 04/26/2022   10:49 AM 01/25/2022   11:08 AM  CBC  WBC 4.0 - 10.5 K/uL 4.7  2.7  2.8   Hemoglobin 13.0 - 17.0 g/dL 13.0  86.5  78.4   Hematocrit 39.0 - 52.0 % 40.2  37.3  37.9   Platelets 150 - 400 K/uL 97  68  103        Latest Ref Rng & Units 07/26/2022   10:41 AM 04/26/2022   10:49 AM 01/25/2022   11:08 AM  CMP  Glucose 70 - 99 mg/dL 696  295  284   Cameron 8 - 23 mg/dL 16  22  28    Creatinine 0.61 - 1.24 mg/dL 1.32  4.40  1.02   Sodium 135 - 145 mmol/L 143  142  141   Potassium 3.5 - 5.1 mmol/L 4.3  4.7  4.6   Chloride 98 - 111 mmol/L 111  111  108   CO2 22 - 32 mmol/L 26  28  28    Calcium 8.9 - 10.3 mg/dL 9.9  9.9  9.9   Total Protein 6.5 - 8.1 g/dL 7.1  6.9  7.0   Total Bilirubin 0.3 - 1.2 mg/dL 0.8  0.6  0.5   Alkaline Phos 38 - 126 U/L 58  50  66   AST 15 - 41 U/L 20  18  20    ALT 0 - 44 U/L 14  11  13      Lab Results  Component Value Date   MPROTEIN Not Observed 10/20/2021   Lab Results  Component Value Date   KPAFRELGTCHN 41.7 (H)  10/20/2021   LAMBDASER 20.6 10/20/2021   KAPLAMBRATIO 2.02 (H) 10/20/2021    RADIOGRAPHIC STUDIES: No results found.  ASSESSMENT & PLAN [name] is a 83 y.o.. male with medical history significant for MDS who presents for a follow up visit.   Previously we discussed the results of the bone marrow biopsy.  Findings are consistent with a myelodysplastic syndrome, although other etiologies such as virus or inflammation can cause the findings.  At this time would recommend continued observation of his blood counts.  They do appear to be trending upward.  Will plan to have the patient return to clinic in 3 months for labs in 6 months for clinic visits.  We discussed the nature of MDS and how observation would be appropriate given that his counts are currently quite mild.  He voices understanding of the plan moving forward.  #Thrombocytopenia/Leukopenia: #  Mild Normocytic Anemia:  --Bone marrow biopsy showed findings concerning for myelodysplastic syndrome. --Recommend observation and monitoring of blood counts with consideration of EPO or transfusion if it becomes necessary.  At the moment counts are mildly low and therefore observation alone is appropriate. --Labs today show white blood cell count ***, hemoglobin ***, MCV *** and platelets of *** --RTC in 6 months for labs/clinic visit.   No orders of the defined types were placed in this encounter.   All questions were answered. The patient knows to call the clinic with any problems, questions or concerns.  A total of more than 25 minutes were spent on this encounter with face-to-face time and non-face-to-face time, including preparing to see the patient, ordering tests and/or medications, counseling the patient and coordination of care as outlined above.   Georga Kaufmann PA-C Dept of Hematology and Oncology [facility] Phone: [phone]   01/24/2023 10:07 PM

## 2023-01-25 ENCOUNTER — Inpatient Hospital Stay: Payer: Medicare PPO | Attending: Physician Assistant

## 2023-01-25 ENCOUNTER — Inpatient Hospital Stay: Payer: Medicare PPO | Admitting: Physician Assistant

## 2023-01-26 ENCOUNTER — Ambulatory Visit: Payer: Medicare PPO | Admitting: Hematology and Oncology

## 2023-01-26 ENCOUNTER — Other Ambulatory Visit: Payer: Medicare PPO

## 2023-01-27 DIAGNOSIS — N3946 Mixed incontinence: Secondary | ICD-10-CM | POA: Diagnosis not present

## 2023-01-27 DIAGNOSIS — G4700 Insomnia, unspecified: Secondary | ICD-10-CM | POA: Diagnosis not present

## 2023-01-27 DIAGNOSIS — E559 Vitamin D deficiency, unspecified: Secondary | ICD-10-CM | POA: Diagnosis not present

## 2023-01-27 DIAGNOSIS — R5383 Other fatigue: Secondary | ICD-10-CM | POA: Diagnosis not present

## 2023-01-27 DIAGNOSIS — M129 Arthropathy, unspecified: Secondary | ICD-10-CM | POA: Diagnosis not present

## 2023-01-27 DIAGNOSIS — M5416 Radiculopathy, lumbar region: Secondary | ICD-10-CM | POA: Diagnosis not present

## 2023-01-27 DIAGNOSIS — R030 Elevated blood-pressure reading, without diagnosis of hypertension: Secondary | ICD-10-CM | POA: Diagnosis not present

## 2023-01-27 DIAGNOSIS — I10 Essential (primary) hypertension: Secondary | ICD-10-CM | POA: Diagnosis not present

## 2023-01-27 DIAGNOSIS — G8929 Other chronic pain: Secondary | ICD-10-CM | POA: Diagnosis not present

## 2023-01-27 DIAGNOSIS — E7800 Pure hypercholesterolemia, unspecified: Secondary | ICD-10-CM | POA: Diagnosis not present

## 2023-01-27 DIAGNOSIS — Z131 Encounter for screening for diabetes mellitus: Secondary | ICD-10-CM | POA: Diagnosis not present

## 2023-01-27 DIAGNOSIS — Z0000 Encounter for general adult medical examination without abnormal findings: Secondary | ICD-10-CM | POA: Diagnosis not present

## 2023-02-27 DIAGNOSIS — R7303 Prediabetes: Secondary | ICD-10-CM | POA: Diagnosis not present

## 2023-02-27 DIAGNOSIS — G8929 Other chronic pain: Secondary | ICD-10-CM | POA: Diagnosis not present

## 2023-02-27 DIAGNOSIS — E559 Vitamin D deficiency, unspecified: Secondary | ICD-10-CM | POA: Diagnosis not present

## 2023-02-27 DIAGNOSIS — E7800 Pure hypercholesterolemia, unspecified: Secondary | ICD-10-CM | POA: Diagnosis not present

## 2023-02-27 DIAGNOSIS — M5416 Radiculopathy, lumbar region: Secondary | ICD-10-CM | POA: Diagnosis not present

## 2023-03-28 DIAGNOSIS — E7800 Pure hypercholesterolemia, unspecified: Secondary | ICD-10-CM | POA: Diagnosis not present

## 2023-03-28 DIAGNOSIS — R339 Retention of urine, unspecified: Secondary | ICD-10-CM | POA: Diagnosis not present

## 2023-03-28 DIAGNOSIS — G894 Chronic pain syndrome: Secondary | ICD-10-CM | POA: Diagnosis not present

## 2023-03-28 DIAGNOSIS — N39490 Overflow incontinence: Secondary | ICD-10-CM | POA: Diagnosis not present

## 2023-03-28 DIAGNOSIS — E559 Vitamin D deficiency, unspecified: Secondary | ICD-10-CM | POA: Diagnosis not present

## 2023-03-28 DIAGNOSIS — R7303 Prediabetes: Secondary | ICD-10-CM | POA: Diagnosis not present

## 2023-04-25 DIAGNOSIS — G894 Chronic pain syndrome: Secondary | ICD-10-CM | POA: Diagnosis not present

## 2023-04-25 DIAGNOSIS — E7800 Pure hypercholesterolemia, unspecified: Secondary | ICD-10-CM | POA: Diagnosis not present

## 2023-04-25 DIAGNOSIS — R29818 Other symptoms and signs involving the nervous system: Secondary | ICD-10-CM | POA: Diagnosis not present

## 2023-04-25 DIAGNOSIS — R339 Retention of urine, unspecified: Secondary | ICD-10-CM | POA: Diagnosis not present

## 2023-04-25 DIAGNOSIS — R7303 Prediabetes: Secondary | ICD-10-CM | POA: Diagnosis not present

## 2023-04-25 DIAGNOSIS — E559 Vitamin D deficiency, unspecified: Secondary | ICD-10-CM | POA: Diagnosis not present

## 2023-04-25 DIAGNOSIS — N39490 Overflow incontinence: Secondary | ICD-10-CM | POA: Diagnosis not present

## 2023-04-25 DIAGNOSIS — R030 Elevated blood-pressure reading, without diagnosis of hypertension: Secondary | ICD-10-CM | POA: Diagnosis not present

## 2023-04-25 DIAGNOSIS — M5416 Radiculopathy, lumbar region: Secondary | ICD-10-CM | POA: Diagnosis not present

## 2023-05-29 DIAGNOSIS — R0602 Shortness of breath: Secondary | ICD-10-CM | POA: Diagnosis not present

## 2023-05-29 DIAGNOSIS — Z1211 Encounter for screening for malignant neoplasm of colon: Secondary | ICD-10-CM | POA: Diagnosis not present

## 2023-05-29 DIAGNOSIS — G894 Chronic pain syndrome: Secondary | ICD-10-CM | POA: Diagnosis not present

## 2023-05-29 DIAGNOSIS — Z79899 Other long term (current) drug therapy: Secondary | ICD-10-CM | POA: Diagnosis not present

## 2023-05-29 DIAGNOSIS — E7800 Pure hypercholesterolemia, unspecified: Secondary | ICD-10-CM | POA: Diagnosis not present

## 2023-05-29 DIAGNOSIS — E559 Vitamin D deficiency, unspecified: Secondary | ICD-10-CM | POA: Diagnosis not present

## 2023-06-06 DIAGNOSIS — I10 Essential (primary) hypertension: Secondary | ICD-10-CM | POA: Diagnosis not present

## 2023-06-06 DIAGNOSIS — F419 Anxiety disorder, unspecified: Secondary | ICD-10-CM | POA: Diagnosis not present

## 2023-06-06 DIAGNOSIS — Z1159 Encounter for screening for other viral diseases: Secondary | ICD-10-CM | POA: Diagnosis not present

## 2023-06-06 DIAGNOSIS — G8929 Other chronic pain: Secondary | ICD-10-CM | POA: Diagnosis not present

## 2023-06-06 DIAGNOSIS — H548 Legal blindness, as defined in USA: Secondary | ICD-10-CM | POA: Diagnosis not present

## 2023-06-06 DIAGNOSIS — Z1321 Encounter for screening for nutritional disorder: Secondary | ICD-10-CM | POA: Diagnosis not present

## 2023-06-06 DIAGNOSIS — K219 Gastro-esophageal reflux disease without esophagitis: Secondary | ICD-10-CM | POA: Diagnosis not present

## 2023-06-06 DIAGNOSIS — Z79899 Other long term (current) drug therapy: Secondary | ICD-10-CM | POA: Diagnosis not present

## 2023-06-06 DIAGNOSIS — B359 Dermatophytosis, unspecified: Secondary | ICD-10-CM | POA: Diagnosis not present

## 2023-06-27 DIAGNOSIS — Z79899 Other long term (current) drug therapy: Secondary | ICD-10-CM | POA: Diagnosis not present

## 2023-07-19 ENCOUNTER — Encounter: Payer: Self-pay | Admitting: Podiatry

## 2023-07-19 ENCOUNTER — Ambulatory Visit: Admitting: Podiatry

## 2023-07-19 DIAGNOSIS — M25572 Pain in left ankle and joints of left foot: Secondary | ICD-10-CM | POA: Diagnosis not present

## 2023-07-19 DIAGNOSIS — M25571 Pain in right ankle and joints of right foot: Secondary | ICD-10-CM | POA: Diagnosis not present

## 2023-07-19 DIAGNOSIS — M79672 Pain in left foot: Secondary | ICD-10-CM | POA: Diagnosis not present

## 2023-07-19 DIAGNOSIS — B351 Tinea unguium: Secondary | ICD-10-CM | POA: Diagnosis not present

## 2023-07-19 DIAGNOSIS — M79671 Pain in right foot: Secondary | ICD-10-CM | POA: Diagnosis not present

## 2023-07-19 NOTE — Progress Notes (Signed)
 Patient presents for evaluation and treatment of tenderness and some redness around nails feet.  Tenderness around toes with walking and wearing shoes.  Also complains of some soreness around the big toe joints.  Bother him sometimes with increased walking or certain shoes  Physical exam:  General appearance: Alert, pleasant, and in no acute distress.  Vascular: Pedal pulses: DP 2/4 B/L, PT 0/4 B/L.  Moderate edema lower legs bilaterally.  Capillary refill time immediate bilaterally  Neurological:  Light touch intact.  Diminished Achilles tendon reflex bilaterally  Dermatologic:  Nails thickened, disfigured, discolored 1-5 BL with subungual debris.  Redness and hypertrophic nail folds along nail folds bilaterally but no signs of drainage or infection.  Musculoskeletal:  Some tenderness at the first MTP bilaterally.  Tender palpation of the medial aspect of the first MTP.  No tenderness of the range of motion of the first MTP.   Diagnosis: 1. Painful onychomycotic nails 1 through 5 bilaterally. 2. Pain toes 1 through 5 bilaterally.  Plan: -New patient office visit level 3 for evaluation and management.  Modifier 25 - Discussed the joint pain probably resulting from some arthritis and underlying knees.  Recommended comfortable wide shoes with good support.  Discussed the onychomycotic recommend periodic debridement of the nails.  -Debrided onychomycotic nails 1 through 5 bilaterally.  Return 3 months RFC

## 2023-09-29 ENCOUNTER — Telehealth: Payer: Self-pay | Admitting: Podiatry

## 2023-09-29 NOTE — Telephone Encounter (Signed)
 Called and left message for patient to contact office to reschedule appointment.

## 2023-10-18 ENCOUNTER — Ambulatory Visit: Admitting: Podiatry
